# Patient Record
Sex: Male | Born: 1950
Health system: Southern US, Community
[De-identification: ages and names within clinical notes are randomized; demographics above are authoritative.]

## PROBLEM LIST (undated history)

## (undated) DIAGNOSIS — Z972 Presence of dental prosthetic device (complete) (partial): Secondary | ICD-10-CM

## (undated) DIAGNOSIS — R339 Retention of urine, unspecified: Secondary | ICD-10-CM

## (undated) DIAGNOSIS — K219 Gastro-esophageal reflux disease without esophagitis: Secondary | ICD-10-CM

## (undated) DIAGNOSIS — M51379 Other intervertebral disc degeneration, lumbosacral region without mention of lumbar back pain or lower extremity pain: Secondary | ICD-10-CM

## (undated) DIAGNOSIS — M5137 Other intervertebral disc degeneration, lumbosacral region: Secondary | ICD-10-CM

## (undated) DIAGNOSIS — F329 Major depressive disorder, single episode, unspecified: Secondary | ICD-10-CM

## (undated) DIAGNOSIS — F32A Depression, unspecified: Secondary | ICD-10-CM

## (undated) DIAGNOSIS — J9819 Other pulmonary collapse: Secondary | ICD-10-CM

## (undated) DIAGNOSIS — N400 Enlarged prostate without lower urinary tract symptoms: Secondary | ICD-10-CM

## (undated) DIAGNOSIS — M1990 Unspecified osteoarthritis, unspecified site: Secondary | ICD-10-CM

## (undated) DIAGNOSIS — M545 Low back pain, unspecified: Secondary | ICD-10-CM

## (undated) DIAGNOSIS — C8590 Non-Hodgkin lymphoma, unspecified, unspecified site: Secondary | ICD-10-CM

## (undated) DIAGNOSIS — Z978 Presence of other specified devices: Secondary | ICD-10-CM

## (undated) DIAGNOSIS — B182 Chronic viral hepatitis C: Secondary | ICD-10-CM

## (undated) DIAGNOSIS — G8929 Other chronic pain: Secondary | ICD-10-CM

## (undated) DIAGNOSIS — Z960 Presence of urogenital implants: Secondary | ICD-10-CM

## (undated) DIAGNOSIS — H9191 Unspecified hearing loss, right ear: Secondary | ICD-10-CM

## (undated) DIAGNOSIS — Z9889 Other specified postprocedural states: Secondary | ICD-10-CM

## (undated) DIAGNOSIS — F1010 Alcohol abuse, uncomplicated: Secondary | ICD-10-CM

## (undated) DIAGNOSIS — F1411 Cocaine abuse, in remission: Secondary | ICD-10-CM

## (undated) DIAGNOSIS — E119 Type 2 diabetes mellitus without complications: Secondary | ICD-10-CM

## (undated) DIAGNOSIS — K08109 Complete loss of teeth, unspecified cause, unspecified class: Secondary | ICD-10-CM

## (undated) DIAGNOSIS — Z973 Presence of spectacles and contact lenses: Secondary | ICD-10-CM

## (undated) DIAGNOSIS — Z923 Personal history of irradiation: Secondary | ICD-10-CM

## (undated) DIAGNOSIS — M4712 Other spondylosis with myelopathy, cervical region: Secondary | ICD-10-CM

## (undated) DIAGNOSIS — N529 Male erectile dysfunction, unspecified: Secondary | ICD-10-CM

## (undated) DIAGNOSIS — G510 Bell's palsy: Secondary | ICD-10-CM

## (undated) HISTORY — PX: JOINT REPLACEMENT: SHX530

## (undated) HISTORY — PX: MULTIPLE TOOTH EXTRACTIONS: SHX2053

## (undated) HISTORY — PX: OTHER SURGICAL HISTORY: SHX169

## (undated) HISTORY — PX: APPENDECTOMY: SHX54

## (undated) HISTORY — PX: BACK SURGERY: SHX140

## (undated) HISTORY — PX: TONSILLECTOMY: SUR1361

---

## 1997-08-02 HISTORY — PX: CERVICAL DISC SURGERY: SHX588

## 1998-04-03 ENCOUNTER — Encounter: Payer: Self-pay | Admitting: Emergency Medicine

## 1998-04-03 ENCOUNTER — Encounter: Payer: Self-pay | Admitting: General Surgery

## 1998-04-03 ENCOUNTER — Inpatient Hospital Stay (HOSPITAL_COMMUNITY): Admission: EM | Admit: 1998-04-03 | Discharge: 1998-04-11 | Payer: Self-pay | Admitting: Emergency Medicine

## 1998-04-04 ENCOUNTER — Encounter: Payer: Self-pay | Admitting: General Surgery

## 1998-04-05 ENCOUNTER — Encounter: Payer: Self-pay | Admitting: General Surgery

## 1998-04-09 ENCOUNTER — Encounter: Payer: Self-pay | Admitting: General Surgery

## 1998-04-30 ENCOUNTER — Inpatient Hospital Stay (HOSPITAL_COMMUNITY): Admission: RE | Admit: 1998-04-30 | Discharge: 1998-05-01 | Payer: Self-pay | Admitting: Neurosurgery

## 1998-08-21 ENCOUNTER — Emergency Department (HOSPITAL_COMMUNITY): Admission: EM | Admit: 1998-08-21 | Discharge: 1998-08-21 | Payer: Self-pay | Admitting: Emergency Medicine

## 2000-01-04 ENCOUNTER — Encounter: Payer: Self-pay | Admitting: Emergency Medicine

## 2000-01-04 ENCOUNTER — Inpatient Hospital Stay (HOSPITAL_COMMUNITY): Admission: EM | Admit: 2000-01-04 | Discharge: 2000-01-06 | Payer: Self-pay | Admitting: Emergency Medicine

## 2000-01-04 HISTORY — PX: OTHER SURGICAL HISTORY: SHX169

## 2000-01-05 ENCOUNTER — Encounter: Payer: Self-pay | Admitting: *Deleted

## 2000-08-26 ENCOUNTER — Emergency Department (HOSPITAL_COMMUNITY): Admission: EM | Admit: 2000-08-26 | Discharge: 2000-08-27 | Payer: Self-pay | Admitting: Internal Medicine

## 2000-11-15 ENCOUNTER — Encounter: Admission: RE | Admit: 2000-11-15 | Discharge: 2000-11-15 | Payer: Self-pay | Admitting: Family Medicine

## 2000-11-15 ENCOUNTER — Encounter: Payer: Self-pay | Admitting: Family Medicine

## 2001-04-03 ENCOUNTER — Emergency Department (HOSPITAL_COMMUNITY): Admission: EM | Admit: 2001-04-03 | Discharge: 2001-04-03 | Payer: Self-pay | Admitting: Emergency Medicine

## 2005-12-01 ENCOUNTER — Encounter: Payer: Self-pay | Admitting: General Surgery

## 2005-12-01 ENCOUNTER — Encounter: Payer: Self-pay | Admitting: Emergency Medicine

## 2006-01-10 ENCOUNTER — Emergency Department (HOSPITAL_COMMUNITY): Admission: EM | Admit: 2006-01-10 | Discharge: 2006-01-10 | Payer: Self-pay | Admitting: Emergency Medicine

## 2008-01-28 ENCOUNTER — Emergency Department (HOSPITAL_COMMUNITY): Admission: EM | Admit: 2008-01-28 | Discharge: 2008-01-29 | Payer: Self-pay | Admitting: Emergency Medicine

## 2008-10-21 ENCOUNTER — Encounter: Admission: RE | Admit: 2008-10-21 | Discharge: 2008-10-21 | Payer: Self-pay | Admitting: Internal Medicine

## 2010-03-26 ENCOUNTER — Emergency Department (HOSPITAL_COMMUNITY): Admission: EM | Admit: 2010-03-26 | Discharge: 2010-03-27 | Payer: Self-pay | Admitting: Emergency Medicine

## 2010-10-15 LAB — URINALYSIS, ROUTINE W REFLEX MICROSCOPIC
Bilirubin Urine: NEGATIVE
Glucose, UA: NEGATIVE mg/dL
Ketones, ur: NEGATIVE mg/dL
Nitrite: NEGATIVE
Specific Gravity, Urine: 1.018 (ref 1.005–1.030)
pH: 6 (ref 5.0–8.0)

## 2010-10-15 LAB — BASIC METABOLIC PANEL
BUN: 8 mg/dL (ref 6–23)
Chloride: 105 mEq/L (ref 96–112)
GFR calc non Af Amer: >60 mL/min (ref >60)
Potassium: 4.9 mEq/L (ref 3.5–5.1)
Sodium: 139 mEq/L (ref 135–145)

## 2010-10-15 LAB — CBC
HCT: 43.6 % (ref 39.0–52.0)
Hemoglobin: 15 g/dL (ref 13.0–17.0)
MCV: 90.6 fL (ref 78.0–100.0)
WBC: 7.2 10*3/uL (ref 4.0–10.5)

## 2010-10-15 LAB — DIFFERENTIAL
Eosinophils Relative: 4 % (ref 0–5)
Lymphocytes Relative: 45 % (ref 12–46)
Lymphs Abs: 3.2 10*3/uL (ref 0.7–4.0)
Monocytes Absolute: 0.6 10*3/uL (ref 0.1–1.0)
Monocytes Relative: 8 % (ref 3–12)
Neutro Abs: 3.1 10*3/uL (ref 1.7–7.7)

## 2010-12-18 NOTE — Op Note (Signed)
[place]. [facility]  Patient:    [name], [name]                          MRN: [id number] Proc. Date: 01/04/00 Adm. Date:  [id number] Disc. Date: [id number] Attending:  H[place]on Pier CC:         Scott R. Egbert Garibaldi, D.D.S.                           Operative Report  PREOPERATIVE DIAGNOSES:  Dental caries, teeth #7 and 9, left body and right parasymphysis fracture of the mandible.  POSTOPERATIVE DIAGNOSES:  Dental caries, teeth #7 and 9, left body and parasymphysis fracture of the mandible.  PROCEDURE:  Closed reduction and intramaxillary fixation, bilateral mental fractures; extraction of teeth #7 and 9; placement of maxillary stent; pyriform aperture wire placement as skeletal fixation.  ANESTHESIA:  Nasotracheal intubation.  SURGEON: Scott R. Egbert Garibaldi, D.D.S.  FLUID REPLACEMENT:  400 cc.  URINE OUTPUT:  To void.  ESTIMATED BLOOD LOSS:  20 cc.  OPERATIVE INDICATIONS:  Mr. Maher is a 60 year old black male status post an alleged fall while moving furniture on January 02, 2000.  He supposedly slipped and impacted his mandible on some masonry.  He stated he was negative for loss of consciousness, nausea, vomiting, or tinnitus.  He states his bite felt off and he had not eaten in two days.  He came to [facility] Saturday and supposedly left due to extensive waiting time in the emergency room.  The patient reports smoking cocaine, last reported time January 02, 2000.  He was wearing a partial plate at the time of the accident, which was lost.  PAST MEDICAL HISTORY:  Allergies:  No known drug allergies.  Hospitalizations: In 1999 for cervical spine fusion.  He also had an appendectomy and some testicular surgery in the past.  PHYSICAL EXAMINATION:  Clinical exam revealed tenderness to palpation of the left mandible and intraorally, obvious step defects between teeth #24 and 25 and 20 and 21.  Teeth #7 and 9 were carious.  A decision was made to bring Tir[place]  to the operating room for closed reduction and possible open reduction of his mental fractures.  In the emergency room, I had taken a maxillary impression to fabricate a stent.  DESCRIPTION OF PROCEDURE:  The patient was brought to the OR, placed in the supine position.  Appropriate monitoring lines were attached, and general anesthesia was induced via IV and inhalational techniques.  Nasotracheal intubation was done without complications.  The patient was prepped and draped in the standard fashion for intraoral and maxillofacial surgical procedure. Xylocaine 2%, 1:100,000 epinephrine, a total of 7 cc, was given bilaterally in the inferior alveolar, buccal, posterior superior alveolar, and incisor nerve blocks were given.  Bridle wires of the 24 gauge variety were placed around teeth #23 to 26, placing the mandible back into an acceptable occlusal position.  These were tightened.  Then a bridle wire was placed around teeth #20 and 21 and once again these were tightened down to the appropriate occlusion.  An Erich arch bar was fabricated from tooth #20 to 29, and this was ligated to the mandibular teeth via 24 gauge wire in a circumdental fashion.  These were cut and tied and rosetted.  At this point, the patient then rotated into an occlusion, but he was noted to have minimal occlusion on the patients right  side.  The predetermined stent was elected to be used.  At this point, teeth #7 and 9 were extracted using a periosteal elevator and an upper universal forceps.  Suture of 3-0 chromic gut x 2 was placed in this region.  The predetermined maxillary stent was then ligated to teeth #3, 5, 10 11, and 12.  These were done to the predrilled splint using a 24 gauge _____ wire.  These were tightened down appropriately.  At this point, it was felt it would be best to put a pyriform aperture wire in.  This was done with a small incision on the right maxillary vestibule down to the pyriform  aperture, at which point a tapered Fisher bur was then used to drill into the pyriform sinus area.  A 22 gauge _____ wire was fed in through this region and then exited intraorally and ligated to the maxillary stent.  This was tightened down and the stent was stable.   At this point, articulating paper was then used to articulate the stent to give an acceptable occlusion, and this was ground using a pear-shaped bur under copious normal saline irrigation.  Once the acceptable occlusion was found to be given, then it was elected to close the incision in the maxillary vestibule.  This was done with 3-0 chromic gut sutures x 4.  At this point, the oral cavity was then suctioned, the throat pack was removed, and box wires x 6 were placed in the mandible to the maxilla to secure a stable occlusion.  The patient was then extubated in the operating room and transferred to the recovery room with vital signs stable. DD:  01/04/00 TD:  01/07/00 Job: [id number]

## 2011-04-26 ENCOUNTER — Emergency Department (HOSPITAL_COMMUNITY): Payer: Self-pay

## 2011-04-26 ENCOUNTER — Emergency Department (HOSPITAL_COMMUNITY)
Admission: EM | Admit: 2011-04-26 | Discharge: 2011-04-26 | Disposition: A | Discharge status: Home / Self Care | Payer: Self-pay | Attending: Emergency Medicine | Admitting: Emergency Medicine

## 2011-04-26 DIAGNOSIS — X58XXXA Exposure to other specified factors, initial encounter: Secondary | ICD-10-CM | POA: Insufficient documentation

## 2011-04-26 DIAGNOSIS — M545 Low back pain, unspecified: Secondary | ICD-10-CM | POA: Insufficient documentation

## 2011-04-26 DIAGNOSIS — R29898 Other symptoms and signs involving the musculoskeletal system: Secondary | ICD-10-CM | POA: Insufficient documentation

## 2011-04-26 DIAGNOSIS — G8929 Other chronic pain: Secondary | ICD-10-CM | POA: Insufficient documentation

## 2011-04-26 DIAGNOSIS — R296 Repeated falls: Secondary | ICD-10-CM | POA: Insufficient documentation

## 2011-04-26 DIAGNOSIS — R079 Chest pain, unspecified: Secondary | ICD-10-CM | POA: Insufficient documentation

## 2011-04-26 DIAGNOSIS — R222 Localized swelling, mass and lump, trunk: Secondary | ICD-10-CM | POA: Insufficient documentation

## 2011-04-26 DIAGNOSIS — S32009A Unspecified fracture of unspecified lumbar vertebra, initial encounter for closed fracture: Secondary | ICD-10-CM | POA: Insufficient documentation

## 2011-05-11 ENCOUNTER — Other Ambulatory Visit (HOSPITAL_COMMUNITY): Payer: Self-pay | Admitting: Neurosurgery

## 2011-05-11 DIAGNOSIS — M545 Low back pain: Secondary | ICD-10-CM

## 2011-05-19 ENCOUNTER — Ambulatory Visit (HOSPITAL_COMMUNITY)
Admission: RE | Admit: 2011-05-19 | Discharge: 2011-05-19 | Disposition: A | Discharge status: Home / Self Care | Payer: Self-pay | Source: Ambulatory Visit | Attending: Neurosurgery | Admitting: Neurosurgery

## 2011-05-19 DIAGNOSIS — M5137 Other intervertebral disc degeneration, lumbosacral region: Secondary | ICD-10-CM | POA: Insufficient documentation

## 2011-05-19 DIAGNOSIS — M51379 Other intervertebral disc degeneration, lumbosacral region without mention of lumbar back pain or lower extremity pain: Secondary | ICD-10-CM | POA: Insufficient documentation

## 2011-05-19 DIAGNOSIS — M545 Low back pain: Secondary | ICD-10-CM

## 2011-07-22 ENCOUNTER — Ambulatory Visit: Payer: Self-pay | Admitting: Physical Therapy

## 2011-08-06 ENCOUNTER — Ambulatory Visit: Payer: Self-pay | Attending: Neurosurgery

## 2011-08-06 DIAGNOSIS — R293 Abnormal posture: Secondary | ICD-10-CM | POA: Insufficient documentation

## 2011-08-06 DIAGNOSIS — M2550 Pain in unspecified joint: Secondary | ICD-10-CM | POA: Insufficient documentation

## 2011-08-06 DIAGNOSIS — IMO0001 Reserved for inherently not codable concepts without codable children: Secondary | ICD-10-CM | POA: Insufficient documentation

## 2011-08-06 DIAGNOSIS — R262 Difficulty in walking, not elsewhere classified: Secondary | ICD-10-CM | POA: Insufficient documentation

## 2011-08-11 ENCOUNTER — Encounter: Payer: Self-pay | Admitting: Rehabilitation

## 2011-08-13 ENCOUNTER — Ambulatory Visit: Payer: Self-pay

## 2011-08-25 ENCOUNTER — Ambulatory Visit: Payer: Self-pay

## 2011-09-07 ENCOUNTER — Ambulatory Visit: Payer: Self-pay | Attending: Neurosurgery

## 2011-09-07 DIAGNOSIS — R262 Difficulty in walking, not elsewhere classified: Secondary | ICD-10-CM | POA: Insufficient documentation

## 2011-09-07 DIAGNOSIS — IMO0001 Reserved for inherently not codable concepts without codable children: Secondary | ICD-10-CM | POA: Insufficient documentation

## 2011-09-07 DIAGNOSIS — R293 Abnormal posture: Secondary | ICD-10-CM | POA: Insufficient documentation

## 2011-09-07 DIAGNOSIS — M2550 Pain in unspecified joint: Secondary | ICD-10-CM | POA: Insufficient documentation

## 2011-09-09 ENCOUNTER — Ambulatory Visit: Payer: Self-pay

## 2011-10-29 ENCOUNTER — Emergency Department (HOSPITAL_COMMUNITY)
Admission: EM | Admit: 2011-10-29 | Discharge: 2011-10-29 | Disposition: A | Discharge status: Home / Self Care | Payer: Medicaid Other | Attending: Emergency Medicine | Admitting: Emergency Medicine

## 2011-10-29 ENCOUNTER — Emergency Department (HOSPITAL_COMMUNITY): Payer: Medicaid Other

## 2011-10-29 ENCOUNTER — Encounter (HOSPITAL_COMMUNITY): Payer: Self-pay | Admitting: *Deleted

## 2011-10-29 DIAGNOSIS — M545 Low back pain, unspecified: Secondary | ICD-10-CM | POA: Insufficient documentation

## 2011-10-29 DIAGNOSIS — K644 Residual hemorrhoidal skin tags: Secondary | ICD-10-CM | POA: Insufficient documentation

## 2011-10-29 DIAGNOSIS — M549 Dorsalgia, unspecified: Secondary | ICD-10-CM | POA: Insufficient documentation

## 2011-10-29 DIAGNOSIS — G8929 Other chronic pain: Secondary | ICD-10-CM | POA: Insufficient documentation

## 2011-10-29 LAB — DIFFERENTIAL
Basophils Relative: 0 % (ref 0–1)
Eosinophils Absolute: 0.2 10*3/uL (ref 0.0–0.7)
Eosinophils Relative: 3 % (ref 0–5)
Monocytes Absolute: 0.4 10*3/uL (ref 0.1–1.0)
Monocytes Relative: 7 % (ref 3–12)
Neutro Abs: 3.2 10*3/uL (ref 1.7–7.7)

## 2011-10-29 LAB — CBC
HCT: 48.3 % (ref 39.0–52.0)
Hemoglobin: 16.3 g/dL (ref 13.0–17.0)
MCH: 31.3 pg (ref 26.0–34.0)
MCHC: 33.7 g/dL (ref 30.0–36.0)
MCV: 92.7 fL (ref 78.0–100.0)
RDW: 12.4 % (ref 11.5–15.5)

## 2011-10-29 LAB — POCT I-STAT, CHEM 8
Calcium, Ion: 1.26 mmol/L (ref 1.12–1.32)
Creatinine, Ser: 1 mg/dL (ref 0.50–1.35)
Glucose, Bld: 98 mg/dL (ref 70–99)
Hemoglobin: 17.7 g/dL — ABNORMAL HIGH (ref 13.0–17.0)
Potassium: 4.4 mEq/L (ref 3.5–5.1)

## 2011-10-29 MED ORDER — HYDROCODONE-ACETAMINOPHEN 10-500 MG PO TABS: 1.0000 | ORAL_TABLET | Freq: Four times a day (QID) | ORAL | Status: AC | PRN

## 2011-10-29 MED ORDER — OXYCODONE-ACETAMINOPHEN 5-325 MG PO TABS: 1.0000 | ORAL_TABLET | Freq: Four times a day (QID) | ORAL | Status: DC | PRN

## 2011-10-29 MED ORDER — PRAMOXINE-ZINC OXIDE IN MO 1-12.5 % RE OINT: TOPICAL_OINTMENT | RECTAL | Status: DC

## 2011-10-29 MED ORDER — OXYCODONE-ACETAMINOPHEN 5-325 MG PO TABS
2.0000 | ORAL_TABLET | Freq: Once | ORAL | Status: AC
  Administered 2011-10-29: 2 via ORAL
  Filled 2011-10-29: qty 2

## 2011-10-29 NOTE — ED Notes (Signed)
Pt returned from x-ray. States 10/10 lower back pain. Lab at the bedside to draw. No signs of distress noted at the time.

## 2011-10-29 NOTE — ED Notes (Signed)
Pt discharged home. Had no further questions. 

## 2011-10-29 NOTE — Discharge Instructions (Signed)
Please follow up with GI specialist if you continue to have rectal pain and discharge not relieved with medication.  Return to ER if you have any other concerns.    Back Exercises Back exercises help treat and prevent back injuries. The goal of back exercises is to increase the strength of your abdominal and back muscles and the flexibility of your back. These exercises should be started when you no longer have back pain. Back exercises include:  Pelvic Tilt. Lie on your back with your knees bent. Tilt your pelvis until the lower part of your back is against the floor. Hold this position 5 to 10 sec and repeat 5 to 10 times.   Knee to Chest. Pull first 1 knee up against your chest and hold for 20 to 30 seconds, repeat this with the other knee, and then both knees. This may be done with the other leg straight or bent, whichever feels better.   Sit-Ups or Curl-Ups. Bend your knees 90 degrees. Start with tilting your pelvis, and do a partial, slow sit-up, lifting your trunk only 30 to 45 degrees off the floor. Take at least 2 to 3 seconds for each sit-up. Do not do sit-ups with your knees out straight. If partial sit-ups are difficult, simply do the above but with only tightening your abdominal muscles and holding it as directed.   Hip-Lift. Lie on your back with your knees flexed 90 degrees. Push down with your feet and shoulders as you raise your hips a couple inches off the floor; hold for 10 seconds, repeat 5 to 10 times.   Back arches. Lie on your stomach, propping yourself up on bent elbows. Slowly press on your hands, causing an arch in your low back. Repeat 3 to 5 times. Any initial stiffness and discomfort should lessen with repetition over time.   Shoulder-Lifts. Lie face down with arms beside your body. Keep hips and torso pressed to floor as you slowly lift your head and shoulders off the floor.  Do not overdo your exercises, especially in the beginning. Exercises may cause you some mild  back discomfort which lasts for a few minutes; however, if the pain is more severe, or lasts for more than 15 minutes, do not continue exercises until you see your caregiver. Improvement with exercise therapy for back problems is slow.  See your caregivers for assistance with developing a proper back exercise program. Document Released: 08/26/2004 Document Revised: 07/08/2011 Document Reviewed: 07/19/2005 [facility] Patient Information 2012 [place], [place].Hemorrhoids Hemorrhoids are enlarged (dilated) veins around the rectum. There are 2 types of hemorrhoids, and the type of hemorrhoid is determined by its location. Internal hemorrhoids occur in the veins just inside the rectum.They are usually not painful, but they may bleed.However, they may poke through to the outside and become irritated and painful. External hemorrhoids involve the veins outside the anus and can be felt as a painful swelling or hard lump near the anus.They are often itchy and may crack and bleed. Sometimes clots will form in the veins. This makes them swollen and painful. These are called thrombosed hemorrhoids. CAUSES Causes of hemorrhoids include:  Pregnancy. This increases the pressure in the hemorrhoidal veins.   Constipation.   Straining to have a bowel movement.   Obesity.   Heavy lifting or other activity that caused you to strain.  TREATMENT Most of the time hemorrhoids improve in 1 to 2 weeks. However, if symptoms do not seem to be getting better or if you have a  lot of rectal bleeding, your caregiver may perform a procedure to help make the hemorrhoids get smaller or remove them completely.Possible treatments include:  Rubber band ligation. A rubber band is placed at the base of the hemorrhoid to cut off the circulation.   Sclerotherapy. A chemical is injected to shrink the hemorrhoid.   Infrared light therapy. Tools are used to burn the hemorrhoid.   Hemorrhoidectomy. This is surgical removal of the  hemorrhoid.  HOME CARE INSTRUCTIONS   Increase fiber in your diet. Ask your caregiver about using fiber supplements.   Drink enough water and fluids to keep your urine clear or pale yellow.   Exercise regularly.   Go to the bathroom when you have the urge to have a bowel movement. Do not wait.   Avoid straining to have bowel movements.   Keep the anal area dry and clean.   Only take over-the-counter or prescription medicines for pain, discomfort, or fever as directed by your caregiver.  If your hemorrhoids are thrombosed:  Take warm sitz baths for 20 to 30 minutes, 3 to 4 times per day.   If the hemorrhoids are very tender and swollen, place ice packs on the area as tolerated. Using ice packs between sitz baths may be helpful. Fill a plastic bag with ice. Place a towel between the bag of ice and your skin.   Medicated creams and suppositories may be used or applied as directed.   Do not use a donut-shaped pillow or sit on the toilet for long periods. This increases blood pooling and pain.  SEEK MEDICAL CARE IF:   You have increasing pain and swelling that is not controlled with your medicine.   You have uncontrolled bleeding.   You have difficulty or you are unable to have a bowel movement.   You have pain or inflammation outside the area of the hemorrhoids.   You have chills or an oral temperature above 102 F (38.9 C).  MAKE SURE YOU:   Understand these instructions.   Will watch your condition.   Will get help right away if you are not doing well or get worse.  Document Released: 07/16/2000 Document Revised: 07/08/2011 Document Reviewed: 11/21/2007 [facility] Patient Information 2012 [place], [place].

## 2011-10-29 NOTE — ED Provider Notes (Signed)
History     CSN: [id number]  Arrival date & time 10/29/11  1315   First MD Initiated Contact with Patient 10/29/11 1432      Chief Complaint  Patient presents with  . Back Pain    (Consider location/radiation/quality/duration/timing/severity/associated sxs/prior treatment) HPI  61 year old male with a history of chronic back pain is presents with a chief complaints of low back pain and rectal pain. Patient states he developed low back pain after falling 40 feet and landed on his back in 2000.  Sts he has chronic pain and usually takes vicodin for it however pain is getting progressively worse in the past month.  Sts pain usually treated better with percocet but since he's taking vicodin it hasn't really control his pain.  Pt also sts he has hx of external hemorrhoids and has been having increasing rectal pain and related to his hemorrhoids.  Sts pain worsen with defecations or with straining.  Has notice occasional bright red blood per rectum, last seen 2 weeks ago.  For the past 2 weeks he has notices rectal discharge on underwear with strong odor.  Think he has infected hemorrhoids.  Denies fever, n/v, abd pain.  Denies cp, sob.  Sts his back pain felt similar to chronic pain.  Denies numbness or weakness or red flag complaints.  Denies rash or recent trauma.  Past Medical History  Diagnosis Date  . Back pain     Past Surgical History  Procedure Date  . Appendectomy   . Lung surgery   . Neck surgery     No family history on file.  History  Substance Use Topics  . Smoking status: Never Smoker   . Smokeless tobacco: Not on file  . Alcohol Use: No      Review of Systems  All other systems reviewed and are negative.    Allergies  Review of patient's allergies indicates no known allergies.  Home Medications   Current Outpatient Rx  Name Route Sig Dispense Refill  . HYDROCODONE-ACETAMINOPHEN 10-325 MG PO TABS Oral Take 1 tablet by mouth every 6 (six) hours as needed.     . OXYCODONE-ACETAMINOPHEN 10-325 MG PO TABS Oral Take 1 tablet by mouth every 6 (six) hours as needed.      BP 116/85  Pulse 88  Temp(Src) 98.2 F (36.8 C) (Oral)  Resp 20  SpO2 98%  Physical Exam  Nursing note and vitals reviewed. Constitutional: He appears well-developed and well-nourished. No distress.       Awake, alert, nontoxic appearance  HENT:  Head: Atraumatic.  Eyes: Conjunctivae are normal. Right eye exhibits no discharge. Left eye exhibits no discharge.  Neck: Normal range of motion. Neck supple.  Cardiovascular: Normal rate and regular rhythm.   Pulmonary/Chest: Effort normal. No respiratory distress. He exhibits no tenderness.  Abdominal: Soft. There is no tenderness. There is no rebound.  Genitourinary:       External hemorrhoid without evidence of thrombosis.  Rectal pain on digital exam, but no evidence of bleeding, or abscess noted.  No mass noted.    Musculoskeletal: He exhibits no tenderness.       ROM appears intact, no obvious focal weakness.  Tenderness to midline of the lower lumbar region without overlying skin changes.  Neurological: He is alert.  Skin: Skin is warm and dry. No rash noted.  Psychiatric: He has a normal mood and affect.    ED Course  Procedures (including critical care time)  Labs Reviewed - No data to  display No results found.   No diagnosis found.  Results for orders placed during the hospital encounter of 10/29/11  CBC      Component Value Range   WBC 6.0  4.0 - 10.5 (K/uL)   RBC 5.21  4.22 - 5.81 (MIL/uL)   Hemoglobin 16.3  13.0 - 17.0 (g/dL)   HCT 40.9  81.1 - 91.4 (%)   MCV 92.7  78.0 - 100.0 (fL)   MCH 31.3  26.0 - 34.0 (pg)   MCHC 33.7  30.0 - 36.0 (g/dL)   RDW 78.2  95.6 - 21.3 (%)   Platelets 225  150 - 400 (K/uL)  DIFFERENTIAL      Component Value Range   Neutrophils Relative 54  43 - 77 (%)   Neutro Abs 3.2  1.7 - 7.7 (K/uL)   Lymphocytes Relative 36  12 - 46 (%)   Lymphs Abs 2.1  0.7 - 4.0 (K/uL)    Monocytes Relative 7  3 - 12 (%)   Monocytes Absolute 0.4  0.1 - 1.0 (K/uL)   Eosinophils Relative 3  0 - 5 (%)   Eosinophils Absolute 0.2  0.0 - 0.7 (K/uL)   Basophils Relative 0  0 - 1 (%)   Basophils Absolute 0.0  0.0 - 0.1 (K/uL)  OCCULT BLOOD, POC DEVICE      Component Value Range   Fecal Occult Bld NEGATIVE     Dg Lumbar Spine Complete  10/29/2011  *RADIOLOGY REPORT*  Clinical Data: Low back pain for 8 months.  LUMBAR SPINE - COMPLETE 4+ VIEW  Comparison: 05/19/2011.  Findings: Mild-moderate multilevel lumbar spondylosis.  Chronic L3 superior endplate compression fracture is unchanged. Per CMS PQRS reporting requirements (PQRS Measure 24): Given the patient's age of greater than 50 and the fracture site (hip, distal radius, or spine), the patient should be tested for osteoporosis using DXA, and the appropriate treatment considered based on the DXA results. L5-S1 predominant spondylosis is present with disc space narrowing and endplate osteophytes.  There are no pars defects identified. L5-S1 facet arthrosis is present.  IMPRESSION: No significant interval change compared to prior MRI 05/19/2011. Lumbar spondylosis and chronic L3 compression fracture.  Original Report Authenticated By: Andreas Newport, M.D.      MDM  Acute on chronic lower back pain and rectal pain w/ hx of external hemorrhoids.  Is afebrile with stable VS.  No obvious abscess or rash noted on exam.  WIll obtain CBC, Istats, and Lspine xray.  Pain medication given.    5:18 PM Stable normal vital signs, and is afebrile. Normal white count. Hemoccult is negative. X-ray of lumbar spine shows no acute finding. Evidence of external hemorrhoid on exam but no evidence of abscess noted. Patient will be getting a referral to GI. I will also give him pain medication and Anusol for symptoms control.  Strict f/u instruction given.  Discussed with my attending.        Fayrene Helper, PA-C 10/29/11 1719

## 2011-10-29 NOTE — ED Notes (Signed)
Patient has complaints of chronic back pain post fall in 2000.  He states he cannot get into see an md due to no insurance.  Patient states he thinks he may also have infected hemorrhoids.  He states he has noticed drainage from his rectum and the area is very swollen

## 2011-10-29 NOTE — ED Provider Notes (Signed)
Medical screening examination/treatment/procedure(s) were performed by non-physician practitioner and as supervising physician I was immediately available for consultation/collaboration.  Chryl Holten R. Mylie Mccurley, MD 10/29/11 2329 

## 2012-05-31 ENCOUNTER — Other Ambulatory Visit (HOSPITAL_COMMUNITY): Payer: Self-pay | Admitting: Neurosurgery

## 2012-05-31 DIAGNOSIS — M5410 Radiculopathy, site unspecified: Secondary | ICD-10-CM

## 2012-05-31 DIAGNOSIS — M549 Dorsalgia, unspecified: Secondary | ICD-10-CM

## 2012-06-05 ENCOUNTER — Ambulatory Visit (HOSPITAL_COMMUNITY): Admission: RE | Admit: 2012-06-05 | Payer: Medicaid Other | Source: Ambulatory Visit

## 2012-06-06 ENCOUNTER — Ambulatory Visit (HOSPITAL_COMMUNITY): Payer: Medicaid Other | Attending: Neurosurgery

## 2012-06-14 ENCOUNTER — Ambulatory Visit (HOSPITAL_COMMUNITY)
Admission: RE | Admit: 2012-06-14 | Discharge: 2012-06-14 | Disposition: A | Discharge status: Home / Self Care | Payer: Medicaid Other | Source: Ambulatory Visit | Attending: Neurosurgery | Admitting: Neurosurgery

## 2012-06-14 DIAGNOSIS — M47817 Spondylosis without myelopathy or radiculopathy, lumbosacral region: Secondary | ICD-10-CM | POA: Insufficient documentation

## 2012-06-14 DIAGNOSIS — M549 Dorsalgia, unspecified: Secondary | ICD-10-CM | POA: Insufficient documentation

## 2012-06-14 DIAGNOSIS — M5410 Radiculopathy, site unspecified: Secondary | ICD-10-CM

## 2012-06-14 DIAGNOSIS — IMO0002 Reserved for concepts with insufficient information to code with codable children: Secondary | ICD-10-CM | POA: Insufficient documentation

## 2012-06-27 ENCOUNTER — Other Ambulatory Visit (HOSPITAL_COMMUNITY): Payer: Self-pay | Admitting: Neurosurgery

## 2012-08-15 ENCOUNTER — Other Ambulatory Visit (HOSPITAL_COMMUNITY): Payer: Self-pay | Admitting: Neurosurgery

## 2012-09-08 ENCOUNTER — Other Ambulatory Visit (HOSPITAL_COMMUNITY): Payer: Self-pay | Admitting: Neurosurgery

## 2013-04-13 ENCOUNTER — Emergency Department (HOSPITAL_COMMUNITY): Payer: Medicaid Other

## 2013-04-13 ENCOUNTER — Encounter (HOSPITAL_COMMUNITY): Payer: Self-pay | Admitting: Adult Health

## 2013-04-13 ENCOUNTER — Emergency Department (HOSPITAL_COMMUNITY)
Admission: EM | Admit: 2013-04-13 | Discharge: 2013-04-13 | Disposition: A | Discharge status: Home / Self Care | Payer: Medicaid Other | Attending: Emergency Medicine | Admitting: Emergency Medicine

## 2013-04-13 ENCOUNTER — Other Ambulatory Visit: Payer: Self-pay

## 2013-04-13 DIAGNOSIS — R109 Unspecified abdominal pain: Secondary | ICD-10-CM | POA: Insufficient documentation

## 2013-04-13 DIAGNOSIS — D1809 Hemangioma of other sites: Secondary | ICD-10-CM | POA: Insufficient documentation

## 2013-04-13 DIAGNOSIS — D1803 Hemangioma of intra-abdominal structures: Secondary | ICD-10-CM

## 2013-04-13 DIAGNOSIS — M549 Dorsalgia, unspecified: Secondary | ICD-10-CM | POA: Insufficient documentation

## 2013-04-13 DIAGNOSIS — R079 Chest pain, unspecified: Secondary | ICD-10-CM | POA: Insufficient documentation

## 2013-04-13 DIAGNOSIS — Z9089 Acquired absence of other organs: Secondary | ICD-10-CM | POA: Insufficient documentation

## 2013-04-13 DIAGNOSIS — R945 Abnormal results of liver function studies: Secondary | ICD-10-CM | POA: Insufficient documentation

## 2013-04-13 DIAGNOSIS — G8929 Other chronic pain: Secondary | ICD-10-CM | POA: Insufficient documentation

## 2013-04-13 LAB — CBC
Platelets: 169 10*3/uL (ref 150–400)
RBC: 4.49 MIL/uL (ref 4.22–5.81)
WBC: 7.6 10*3/uL (ref 4.0–10.5)

## 2013-04-13 LAB — PRO B NATRIURETIC PEPTIDE: Pro B Natriuretic peptide (BNP): 131.4 pg/mL — ABNORMAL HIGH (ref 0–125)

## 2013-04-13 LAB — HEPATIC FUNCTION PANEL
ALT: 74 U/L — ABNORMAL HIGH (ref 0–53)
AST: 69 U/L — ABNORMAL HIGH (ref 0–37)
Albumin: 4.2 g/dL (ref 3.5–5.2)
Total Protein: 8.4 g/dL — ABNORMAL HIGH (ref 6.0–8.3)

## 2013-04-13 LAB — BASIC METABOLIC PANEL
CO2: 24 mEq/L (ref 19–32)
Chloride: 97 mEq/L (ref 96–112)
Sodium: 134 mEq/L — ABNORMAL LOW (ref 135–145)

## 2013-04-13 LAB — POCT I-STAT TROPONIN I

## 2013-04-13 MED ORDER — OXYCODONE HCL 5 MG PO TABS: 5.0000 mg | ORAL_TABLET | Freq: Four times a day (QID) | ORAL | Status: DC | PRN

## 2013-04-13 MED ORDER — OXYCODONE HCL 5 MG PO TABS
5.0000 mg | ORAL_TABLET | Freq: Once | ORAL | Status: AC
  Administered 2013-04-13: 5 mg via ORAL
  Filled 2013-04-13: qty 1

## 2013-04-13 NOTE — ED Provider Notes (Signed)
CSN: [id number]     Arrival date & time 04/13/13  0040 History   First MD Initiated Contact with Patient 04/13/13 0250     Chief Complaint  Patient presents with  . Chest Pain  . Back Pain   (Consider location/radiation/quality/duration/timing/severity/associated sxs/prior Treatment) HPI 62 year old male presents to emergency room with multiple complaints.  He reports he's been having left lower chest tingling type pain ongoing for the last 3 months, diffuse upper abdominal pain, radiating into his ribs.  He occasionally gets shortness of breath with exertion or deep inspiration.  He denies any fevers or chills, no weight loss, no cough.  He is nonsmoker.  Patient has history of chronic back pain after an accident, but no other medical problems.  He reports he saw a chest x-ray done after his last in jail, and was concerned about a mass that he noted in his left lower lung.  He reports he was never told what was wrong with this mass, and it has been weighing on his mind.  Patient is also here for back pain.  He reports prior cervical and lumbar fractures.  He has pain at his left SI joint that radiates down his leg.  He is wanting further workup and treatment of this ongoing chronic pain. Past Medical History  Diagnosis Date  . Back pain    Past Surgical History  Procedure Laterality Date  . Appendectomy    . Lung surgery    . Neck surgery     History reviewed. No pertinent family history. History  Substance Use Topics  . Smoking status: Never Smoker   . Smokeless tobacco: Not on file  . Alcohol Use: No    Review of Systems  All other systems reviewed and are negative.   other than listed in history of present illness  Allergies  Review of patient's allergies indicates no known allergies.  Home Medications  No current outpatient prescriptions on file. BP 111/81  Pulse 105  Temp(Src) 98.7 F (37.1 C) (Oral)  Resp 16  Wt 182 lb (82.555 kg)  SpO2 99% Physical Exam   Nursing note and vitals reviewed. Constitutional: He is oriented to person, place, and time. He appears well-developed and well-nourished. He appears distressed (, anxious appearing).  HENT:  Head: Normocephalic and atraumatic.  Nose: Nose normal.  Mouth/Throat: Oropharynx is clear and moist.  Eyes: Conjunctivae and EOM are normal. Pupils are equal, round, and reactive to light.  Neck: Normal range of motion. Neck supple. No JVD present. No tracheal deviation present. No thyromegaly present.  Cardiovascular: Normal rate, regular rhythm, normal heart sounds and intact distal pulses.  Exam reveals no gallop and no friction rub.   No murmur heard. Pulmonary/Chest: Effort normal and breath sounds normal. No stridor. No respiratory distress. He has no wheezes. He has no rales. He exhibits no tenderness.  Abdominal: Soft. Bowel sounds are normal. He exhibits no distension and no mass. There is tenderness (diffuse tenderness across upper abdomen, worse in right upper quadrant). There is no rebound and no guarding.  Musculoskeletal: Normal range of motion. He exhibits no edema and no tenderness.  Lymphadenopathy:    He has no cervical adenopathy.  Neurological: He is alert and oriented to person, place, and time. He exhibits normal muscle tone. Coordination normal.  Skin: Skin is warm and dry. No rash noted. No erythema. No pallor.  Psychiatric: He has a normal mood and affect. His behavior is normal. Judgment and thought content normal.  ED Course  Procedures (including critical care time) Labs Review Labs Reviewed  BASIC METABOLIC PANEL - Abnormal; Notable for the following:    Sodium 134 (*)    Glucose, Bld 108 (*)    GFR calc non Af Amer 88 (*)    All other components within normal limits  PRO B NATRIURETIC PEPTIDE - Abnormal; Notable for the following:    Pro B Natriuretic peptide (BNP) 131.4 (*)    All other components within normal limits  HEPATIC FUNCTION PANEL - Abnormal; Notable  for the following:    Total Protein 8.4 (*)    AST 69 (*)    ALT 74 (*)    All other components within normal limits  CBC  TROPONIN I   Imaging Review Dg Chest 2 View  04/13/2013   CLINICAL DATA:  Chest pain on the left with left arm numbness. Shortness of Breath. Low back pain. Symptoms for 2 or 3 months.  EXAM: CHEST  2 VIEW  COMPARISON:  04/26/2011  FINDINGS: The heart size and mediastinal contours are within normal limits. Both lungs are clear. The visualized skeletal structures are unremarkable. Postoperative changes in the cervical spine. Degenerative changes in the thoracic spine. No significant change since previous study.  IMPRESSION: No active cardiopulmonary disease.   Electronically Signed   By: Burman Nieves   On: 04/13/2013 01:22   US Abdomen Complete  04/13/2013   CLINICAL DATA:  Left upper quadrant pain.  EXAM: ABDOMEN ULTRASOUND  COMPARISON:  CT abdomen and pelvis 03/27/2010  FINDINGS: Gallbladder  No gallstones or wall thickening. Negative sonographic Murphy's sign.  Common bile duct  Diameter: Normal caliber.  Diameter measures 3.5 mm  Liver  Focal hyperechoic lesion in the inferior right hepatic lobe measures 1.3 x 1.1 x 1.4 cm. The lesion is homogeneous hyperechoic with increased through transmission. The appearance is consistent with hemangioma. No other focal liver lesions identified. Otherwise normal parenchymal echotexture.  IVC  No abnormality visualized.  Pancreas  Not visualized due to overlying bowel gas.  Spleen  Size and appearance within normal limits.  Right Kidney  Length: 10.5 Echogenicity within normal limits. No mass or hydronephrosis visualized.  Left Kidney  Length: 11.2 Echogenicity within normal limits. No mass or hydronephrosis visualized.  Abdominal aorta  No aneurysm visualized.  IMPRESSION: Hyperechoic liver lesion consistent with hemangioma. Examination is otherwise unremarkable.   Electronically Signed   By: Burman Nieves   On: 04/13/2013 05:42     Date: 04/13/2013  Rate: 107  Rhythm: sinus tachycardia  QRS Axis: normal  Intervals: normal  ST/T Wave abnormalities: normal  Conduction Disutrbances:none  Narrative Interpretation:   Old EKG Reviewed: none available   MDM   1. Abdominal pain   2. Hemangioma of liver   3. Elevated LFTs   4. Chronic back pain    62 year old male with 3 months of upper abdominal pain, and some chest tingling.  I showed him today.  His x-ray, and he indicates that the left heart border is what he saw on the chest x-ray that concerning for mass.  He should is tenderness.  Right upper quadrant.  Today.  We'll get ultrasound and LFTs for possible hepatitis or cholecystitis.  EKG shows mild sinus tachycardia, chest x-ray is normal.  Do not feel that symptoms are cardiac related.  Do not feel patient has PE.  If ultrasound and remaining lab work is negative, will help.  Patient find a primary care doctor and refer him to orthopedics  for his ongoing back pain.   Olivia Mackie, MD 04/13/13 [id number]

## 2013-04-13 NOTE — ED Notes (Signed)
Presents with 3 weeks of intermittent "tingling" chest pain worse with deep inspiration, exertion, painis left sided does not radiate. Associated with SOB, dizziness. Denies nausea and vomiting. Nothing makes pain better. Pt also c/o chronic lower back pain from a injury many years ago.

## 2013-04-18 ENCOUNTER — Ambulatory Visit (HOSPITAL_COMMUNITY)
Admission: RE | Admit: 2013-04-18 | Discharge: 2013-04-18 | Disposition: A | Discharge status: Home / Self Care | Payer: Medicaid Other | Source: Ambulatory Visit | Attending: Nurse Practitioner | Admitting: Nurse Practitioner

## 2013-04-18 ENCOUNTER — Other Ambulatory Visit (HOSPITAL_COMMUNITY): Payer: Self-pay | Admitting: Nurse Practitioner

## 2013-04-18 DIAGNOSIS — R52 Pain, unspecified: Secondary | ICD-10-CM

## 2013-04-18 DIAGNOSIS — M1710 Unilateral primary osteoarthritis, unspecified knee: Secondary | ICD-10-CM | POA: Insufficient documentation

## 2013-04-30 ENCOUNTER — Emergency Department (HOSPITAL_COMMUNITY)
Admission: EM | Admit: 2013-04-30 | Discharge: 2013-04-30 | Disposition: A | Discharge status: Home / Self Care | Payer: Medicaid Other | Attending: Emergency Medicine | Admitting: Emergency Medicine

## 2013-04-30 ENCOUNTER — Encounter (HOSPITAL_COMMUNITY): Payer: Self-pay | Admitting: *Deleted

## 2013-04-30 DIAGNOSIS — M25462 Effusion, left knee: Secondary | ICD-10-CM

## 2013-04-30 DIAGNOSIS — M25569 Pain in unspecified knee: Secondary | ICD-10-CM | POA: Insufficient documentation

## 2013-04-30 DIAGNOSIS — M25469 Effusion, unspecified knee: Secondary | ICD-10-CM | POA: Insufficient documentation

## 2013-04-30 DIAGNOSIS — Z8781 Personal history of (healed) traumatic fracture: Secondary | ICD-10-CM | POA: Insufficient documentation

## 2013-04-30 DIAGNOSIS — M25562 Pain in left knee: Secondary | ICD-10-CM

## 2013-04-30 DIAGNOSIS — Z791 Long term (current) use of non-steroidal anti-inflammatories (NSAID): Secondary | ICD-10-CM | POA: Insufficient documentation

## 2013-04-30 MED ORDER — OXYCODONE-ACETAMINOPHEN 5-325 MG PO TABS: ORAL_TABLET | ORAL | Status: DC

## 2013-04-30 MED ORDER — OXYCODONE-ACETAMINOPHEN 5-325 MG PO TABS
2.0000 | ORAL_TABLET | Freq: Once | ORAL | Status: AC
  Administered 2013-04-30: 2 via ORAL
  Filled 2013-04-30: qty 2

## 2013-04-30 NOTE — Progress Notes (Signed)
Orthopedic Tech Progress Note Patient Details:  [name] 1951-04-14 [id number]  Ortho Devices Type of Ortho Device: Crutches;Knee Sleeve Ortho Device/Splint Location: LLE Ortho Device/Splint Interventions: Ordered;Application   Jennye Moccasin 04/30/2013, 10:47 PM

## 2013-04-30 NOTE — ED Notes (Signed)
Crutches given by [employer]

## 2013-04-30 NOTE — ED Notes (Signed)
Pt states that he has left knee pain from an known injury that he cannot see an orthopedic until next week. Pt states he is out of his pain medication (10mg  percocets) pt states he cannot deal with the pain until next month.

## 2013-04-30 NOTE — ED Provider Notes (Signed)
CSN: [id number]     Arrival date & time 04/30/13  2042 History  This chart was scribed for non-physician practitioner Junius Finner, PA-C working with Junius Argyle, MD by Leone Payor, ED Scribe. This patient was seen in room TR11C/TR11C and the patient's care was started at 2042.    Chief Complaint  Patient presents with  . Knee Pain    The history is provided by the patient. No language interpreter was used.    HPI Comments: [name] is a 62 y.o. male who presents to the Emergency Department complaining of ongoing, constant, gradually worsening left knee pain and swelling that began 3 weeks ago. He states having a known prior injury when he fractured his patella. He describes the pain as aching and throbbing and rates is as 10/10. He states he cannot be seen by Dr. Lovell Sheehan at a pain clinic until 05/23/13. He denies fever, rash, calf pain, numbness or weakness in the BLE. He denies history of gout.   Pt denies smoking and alcohol use.   Past Medical History  Diagnosis Date  . Back pain    Past Surgical History  Procedure Laterality Date  . Appendectomy    . Lung surgery    . Neck surgery     History reviewed. No pertinent family history. History  Substance Use Topics  . Smoking status: Never Smoker   . Smokeless tobacco: Not on file  . Alcohol Use: No    Review of Systems  Musculoskeletal: Positive for arthralgias (left knee pain).  Neurological: Negative for weakness and numbness.  All other systems reviewed and are negative.    Allergies  Review of patient's allergies indicates no known allergies.  Home Medications   Current Outpatient Rx  Name  Route  Sig  Dispense  Refill  . naproxen sodium (ANAPROX) 220 MG tablet   Oral   Take 440 mg by mouth 2 (two) times daily with a meal.         . oxyCODONE (OXY IR/ROXICODONE) 5 MG immediate release tablet   Oral   Take 1 tablet (5 mg total) by mouth every 6 (six) hours as needed for pain.   20 tablet   0    . oxyCODONE-acetaminophen (PERCOCET/ROXICET) 5-325 MG per tablet      Take 1-2 pills every 4-6 hours as needed for pain.   15 tablet   0    BP 129/86  Pulse 98  Temp(Src) 97.8 F (36.6 C) (Oral)  Resp 16  SpO2 98% Physical Exam  Nursing note and vitals reviewed. Constitutional: He is oriented to person, place, and time. He appears well-developed and well-nourished. No distress.  HENT:  Head: Normocephalic and atraumatic.  Eyes: EOM are normal.  Neck: Normal range of motion. Neck supple.  Cardiovascular: Normal rate.   Pulmonary/Chest: Effort normal.  Musculoskeletal: He exhibits edema and tenderness.  Moderate swelling to the left knee. Tenderness to palpation throughout. Pain with knee flexion, limited to 90 degrees due to pain. Pedal pulses 2+. Full ROM in left foot. Plantar flexion 4/5 bilaterally. Unable to bear weight.   Neurological: He is alert and oriented to person, place, and time.  Skin: Skin is warm and dry. He is not diaphoretic.  Psychiatric: He has a normal mood and affect. His behavior is normal.    ED Course  Procedures  DIAGNOSTIC STUDIES: Oxygen Saturation is 98% on RA, normal by my interpretation.    COORDINATION OF CARE: 10:17 PM Discussed treatment plan with pt  at bedside and pt agreed to plan.   Labs Review Labs Reviewed - No data to display Imaging Review No results found.  MDM   1. Left knee pain   2. Knee effusion, left    Pt c/o gradually worsening left knee pain and swelling.  Hx of patella fracture several years ago w/o new injuries.  Has not f/u with orthopedics recently.  Sees Dr. Lovell Sheehan for chronic back pain.  Knee does appear moderately swollen and is TTP. Pt denies new injuries, hx of gout, fever, n/v.  No further workup needed at this time.  Will giver pain meds, knee sleeve, crutches. Call to make a f/u with [facility] for further evaluation and tx of knee pain. AAll questions answered and concerns addressed. Return  precautions given. Pt verbalized understanding and agreement with tx plan. Vitals: unremarkable. Discharged in stable condition.     I personally performed the services described in this documentation, which was scribed in my presence. The recorded information has been reviewed and is accurate.   Junius Finner, PA-C 05/01/13 0007

## 2013-04-30 NOTE — ED Notes (Signed)
C/o L knee swelling and pain, swelling noted (minimal), CMS intact, ROM intact, pain with movement, has not seen ortho for this problem, "has been going on for months/ years".

## 2013-04-30 NOTE — ED Notes (Signed)
Pt up to b/r,walks with limp.

## 2013-04-30 NOTE — ED Notes (Signed)
Ortho tech at BS 

## 2013-04-30 NOTE — ED Notes (Signed)
No changes, steady gait, given pain med at time of d/c and referral.

## 2013-05-01 NOTE — ED Provider Notes (Signed)
Medical screening examination/treatment/procedure(s) were performed by non-physician practitioner and as supervising physician I was immediately available for consultation/collaboration.   Junius Argyle, MD 05/01/13 [phone]

## 2013-05-31 ENCOUNTER — Other Ambulatory Visit: Payer: Self-pay | Admitting: Neurosurgery

## 2013-05-31 DIAGNOSIS — M5416 Radiculopathy, lumbar region: Secondary | ICD-10-CM

## 2013-06-10 ENCOUNTER — Encounter (HOSPITAL_COMMUNITY): Payer: Self-pay | Admitting: Emergency Medicine

## 2013-06-10 ENCOUNTER — Emergency Department (EMERGENCY_DEPARTMENT_HOSPITAL)
Admission: EM | Admit: 2013-06-10 | Discharge: 2013-06-11 | Disposition: A | Discharge status: Other Acute Inpatient Hospital | Payer: Medicaid Other | Source: Home / Self Care | Attending: Emergency Medicine | Admitting: Emergency Medicine

## 2013-06-10 DIAGNOSIS — F1410 Cocaine abuse, uncomplicated: Secondary | ICD-10-CM | POA: Insufficient documentation

## 2013-06-10 DIAGNOSIS — F1020 Alcohol dependence, uncomplicated: Principal | ICD-10-CM | POA: Diagnosis present

## 2013-06-10 DIAGNOSIS — F1910 Other psychoactive substance abuse, uncomplicated: Secondary | ICD-10-CM

## 2013-06-10 DIAGNOSIS — Z79899 Other long term (current) drug therapy: Secondary | ICD-10-CM

## 2013-06-10 DIAGNOSIS — Z5900 Homelessness unspecified: Secondary | ICD-10-CM | POA: Insufficient documentation

## 2013-06-10 DIAGNOSIS — F411 Generalized anxiety disorder: Secondary | ICD-10-CM | POA: Diagnosis present

## 2013-06-10 DIAGNOSIS — F1420 Cocaine dependence, uncomplicated: Secondary | ICD-10-CM | POA: Diagnosis present

## 2013-06-10 DIAGNOSIS — F332 Major depressive disorder, recurrent severe without psychotic features: Secondary | ICD-10-CM | POA: Diagnosis present

## 2013-06-10 DIAGNOSIS — F1010 Alcohol abuse, uncomplicated: Secondary | ICD-10-CM | POA: Insufficient documentation

## 2013-06-10 LAB — RAPID URINE DRUG SCREEN, HOSP PERFORMED
Amphetamines: NOT DETECTED
Barbiturates: NOT DETECTED
Benzodiazepines: NOT DETECTED
Cocaine: POSITIVE — AB
Opiates: NOT DETECTED
Tetrahydrocannabinol: NOT DETECTED

## 2013-06-10 LAB — COMPREHENSIVE METABOLIC PANEL
ALT: 95 U/L — ABNORMAL HIGH (ref 0–53)
AST: 80 U/L — ABNORMAL HIGH (ref 0–37)
Albumin: 3.7 g/dL (ref 3.5–5.2)
Alkaline Phosphatase: 72 U/L (ref 39–117)
BUN: 12 mg/dL (ref 6–23)
CO2: 31 mEq/L (ref 19–32)
Calcium: 10.4 mg/dL (ref 8.4–10.5)
Chloride: 98 mEq/L (ref 96–112)
Creatinine, Ser: 1.13 mg/dL (ref 0.50–1.35)
GFR calc Af Amer: 80 mL/min — ABNORMAL LOW (ref >90)
GFR calc non Af Amer: 69 mL/min — ABNORMAL LOW (ref >90)
Glucose, Bld: 60 mg/dL — ABNORMAL LOW (ref 70–99)
Potassium: 4 mEq/L (ref 3.5–5.1)
Sodium: 137 mEq/L (ref 135–145)
Total Bilirubin: 0.3 mg/dL (ref 0.3–1.2)
Total Protein: 8.2 g/dL (ref 6.0–8.3)

## 2013-06-10 LAB — URINALYSIS, ROUTINE W REFLEX MICROSCOPIC
Bilirubin Urine: NEGATIVE
Glucose, UA: NEGATIVE mg/dL
Ketones, ur: NEGATIVE mg/dL
Leukocytes, UA: NEGATIVE
Nitrite: NEGATIVE
Protein, ur: NEGATIVE mg/dL
Urobilinogen, UA: 1 mg/dL (ref 0.0–1.0)
pH: 6.5 (ref 5.0–8.0)

## 2013-06-10 LAB — CBC
HCT: 44.7 % (ref 39.0–52.0)
Hemoglobin: 15.4 g/dL (ref 13.0–17.0)
MCH: 32.3 pg (ref 26.0–34.0)
MCHC: 34.5 g/dL (ref 30.0–36.0)
MCV: 93.7 fL (ref 78.0–100.0)
Platelets: 252 10*3/uL (ref 150–400)
RBC: 4.77 MIL/uL (ref 4.22–5.81)
RDW: 13.1 % (ref 11.5–15.5)
WBC: 5.9 10*3/uL (ref 4.0–10.5)

## 2013-06-10 LAB — ETHANOL: Alcohol, Ethyl (B): <11 mg/dL (ref 0–11)

## 2013-06-10 MED ORDER — ALUM & MAG HYDROXIDE-SIMETH 200-200-20 MG/5ML PO SUSP: 30.0000 mL | ORAL | Status: DC | PRN

## 2013-06-10 MED ORDER — ONDANSETRON HCL 4 MG PO TABS: 4.0000 mg | ORAL_TABLET | Freq: Three times a day (TID) | ORAL | Status: DC | PRN

## 2013-06-10 MED ORDER — IBUPROFEN 200 MG PO TABS: 600.0000 mg | ORAL_TABLET | Freq: Three times a day (TID) | ORAL | Status: DC | PRN

## 2013-06-10 MED ORDER — ZOLPIDEM TARTRATE 5 MG PO TABS: 5.0000 mg | ORAL_TABLET | Freq: Every evening | ORAL | Status: DC | PRN

## 2013-06-10 MED ORDER — ACETAMINOPHEN 325 MG PO TABS: 650.0000 mg | ORAL_TABLET | ORAL | Status: DC | PRN

## 2013-06-10 NOTE — ED Notes (Signed)
Pt would like detox from ETOH and crack. Last drink noted Sat morning. Pt has been sober for 14 months and began drinking again for 1 week. Pt drank a 6 pack beer per day for the past 7 days. Pt also last used crack on Saturday morning and has been using $150 per day for 1 week. Pt states his mom went to a nursing home in July for a stroke and the family said it was his fault for the stress he has put her through. Pt states he feels like he wanted to hurt him self and go see god before she did. Pt still is having SI. Pt has support person from [facility] at bedside. Pt called this person to let him know he has not sober anymore and support person felt urgency to bring pt in before pt hurt himself.

## 2013-06-10 NOTE — ED Notes (Signed)
Pt transferred from triage, presents for alcohol and crack cocaine detox.  Pt reports he drinks a 6-12 pack of beer per day and abused $700 of crack cocaine today.  Feeling hopeless at present.  Reports he would attempt suicide by taking an overdose of drugs.  Denies AV hallucinations or HI.  Pt reports his family has nothing to do with him, related to his mother having a stroke.  Pt calm & cooperative, tearful at present.  Pt is homeless.

## 2013-06-10 NOTE — ED Notes (Signed)
DON [facility] [phone] cell [phone]

## 2013-06-10 NOTE — Consult Note (Signed)
  Pt assessed by ACT Claims to be suicidal.Requesting detox from cocaine and alcohol.Pt has hx of chronic pain and receives regular opiate rx from Neurosurgeon Tressie Stalker for past year but he has also bee given additional opiate in ER and has no opiates in his UDS but reports a $700 cocaine habit while being homeless.Pt stated he had not taken any pain medication in a week.His ETOH level is normal/negative.There are currently no beds at [facility] The patient will be placed at first available facility with appropriate bed including [facility].
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
## 2013-06-10 NOTE — ED Provider Notes (Signed)
CSN: [id number]     Arrival date & time 06/10/13  1356 History  This chart was scribed for non-physician practitioner Carlyle Dolly, PA-C, working with Lyanne Co, MD, by Yevette Edwards, ED Scribe. This patient was seen in room WTR4/WLPT4 and the patient's care was started at 2:33 PM.   First MD Initiated Contact with Patient 06/10/13 1430     Chief Complaint  Patient presents with  . Medical Clearance    The history is provided by the patient and medical records. No language interpreter was used.   HPI Comments: [name] is a 62 y.o. male who presents to the Emergency Department for the purpose of medical clearance. The pt reports that after his mother's surgery several months ago and subsequent stay in a nursing home, he felt guilty about his mother's condition and, last week, began to drink alcohol and use crack-cocaine after 14 months of sobriety. He last used crack-cocaine yesterday morning, and he states he has been  approximately $150 a day on crack-cocaine for the past week. This week, he has also been drinking a  6 pack of beer a day. He denies any current SI. However, he states that he has experienced SI recently. He denies any other health concerns. He also denies smoking cigarettes.  Past Medical History  Diagnosis Date  . Back pain    Past Surgical History  Procedure Laterality Date  . Appendectomy    . Lung surgery    . Neck surgery     No family history on file. History  Substance Use Topics  . Smoking status: Never Smoker   . Smokeless tobacco: Not on file  . Alcohol Use: 21.6 oz/week    36 Cans of beer per week    Review of Systems A complete 10 system review of systems was obtained, and all systems were negative except where indicated in the HPI and PE.   Allergies  Review of patient's allergies indicates no known allergies.  Home Medications   Current Outpatient Rx  Name  Route  Sig  Dispense  Refill  . naproxen sodium (ANAPROX) 220 MG  tablet   Oral   Take 440 mg by mouth 2 (two) times daily with a meal.         . oxyCODONE (OXY IR/ROXICODONE) 5 MG immediate release tablet   Oral   Take 1 tablet (5 mg total) by mouth every 6 (six) hours as needed for pain.   20 tablet   0   . oxyCODONE-acetaminophen (PERCOCET/ROXICET) 5-325 MG per tablet      Take 1-2 pills every 4-6 hours as needed for pain.   15 tablet   0    Triage Vitals: BP 131/88  Pulse 94  Temp(Src) 98.2 F (36.8 C) (Oral)  Resp 16  SpO2 97%  Physical Exam  Nursing note and vitals reviewed. Constitutional: He is oriented to person, place, and time. He appears well-developed and well-nourished. No distress.  HENT:  Head: Normocephalic and atraumatic.  Eyes: EOM are normal.  Neck: Neck supple. No tracheal deviation present.  Cardiovascular: Normal rate.   Pulmonary/Chest: Effort normal. No respiratory distress.  Musculoskeletal: Normal range of motion.  Neurological: He is alert and oriented to person, place, and time.  Skin: Skin is warm and dry.  Psychiatric: He has a normal mood and affect. His behavior is normal.    ED Course  Procedures (including critical care time)  DIAGNOSTIC STUDIES: Oxygen Saturation is 97% on room air, normal  by my interpretation.    COORDINATION OF CARE:  2:38 PM- Discussed treatment plan with patient, and the patient agreed to the plan.   Labs Review Labs Reviewed  COMPREHENSIVE METABOLIC PANEL - Abnormal; Notable for the following:    Glucose, Bld 60 (*)    AST 80 (*)    ALT 95 (*)    GFR calc non Af Amer 69 (*)    GFR calc Af Amer 80 (*)    All other components within normal limits  CBC  ETHANOL  URINE RAPID DRUG SCREEN (HOSP PERFORMED)     Carlyle Dolly, PA-C 06/10/13 1648

## 2013-06-10 NOTE — ED Notes (Signed)
Report called to [facility] RN in secured area
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
## 2013-06-11 ENCOUNTER — Inpatient Hospital Stay (HOSPITAL_COMMUNITY): Admission: AD | Admit: 2013-06-11 | Payer: Medicaid Other | Source: Intra-hospital

## 2013-06-11 ENCOUNTER — Encounter (HOSPITAL_COMMUNITY): Payer: Self-pay | Admitting: Intensive Care

## 2013-06-11 ENCOUNTER — Inpatient Hospital Stay (HOSPITAL_COMMUNITY)
Admission: AD | Admit: 2013-06-11 | Discharge: 2013-06-18 | DRG: 897 | Disposition: A | Discharge status: Home / Self Care | Payer: Medicaid Other | Source: Intra-hospital | Attending: Psychiatry | Admitting: Psychiatry

## 2013-06-11 DIAGNOSIS — F1020 Alcohol dependence, uncomplicated: Secondary | ICD-10-CM | POA: Diagnosis present

## 2013-06-11 DIAGNOSIS — F39 Unspecified mood [affective] disorder: Secondary | ICD-10-CM

## 2013-06-11 DIAGNOSIS — F1910 Other psychoactive substance abuse, uncomplicated: Secondary | ICD-10-CM

## 2013-06-11 DIAGNOSIS — F1994 Other psychoactive substance use, unspecified with psychoactive substance-induced mood disorder: Secondary | ICD-10-CM | POA: Diagnosis present

## 2013-06-11 DIAGNOSIS — F1420 Cocaine dependence, uncomplicated: Secondary | ICD-10-CM | POA: Diagnosis present

## 2013-06-11 LAB — HEPATITIS PANEL, ACUTE
Hep A IgM: NONREACTIVE
Hepatitis B Surface Ag: NEGATIVE

## 2013-06-11 MED ORDER — ALUM & MAG HYDROXIDE-SIMETH 200-200-20 MG/5ML PO SUSP: 30.0000 mL | ORAL | Status: DC | PRN

## 2013-06-11 MED ORDER — ONDANSETRON HCL 4 MG PO TABS: 4.0000 mg | ORAL_TABLET | Freq: Three times a day (TID) | ORAL | Status: DC | PRN

## 2013-06-11 MED ORDER — ACETAMINOPHEN 325 MG PO TABS
650.0000 mg | ORAL_TABLET | Freq: Four times a day (QID) | ORAL | Status: DC | PRN
  Administered 2013-06-12 – 2013-06-17 (×4): 650 mg via ORAL
  Filled 2013-06-11 (×4): qty 2

## 2013-06-11 MED ORDER — ONDANSETRON 4 MG PO TBDP: 4.0000 mg | ORAL_TABLET | Freq: Four times a day (QID) | ORAL | Status: AC | PRN

## 2013-06-11 MED ORDER — HYDROXYZINE HCL 25 MG PO TABS
25.0000 mg | ORAL_TABLET | Freq: Four times a day (QID) | ORAL | Status: AC | PRN
  Filled 2013-06-11: qty 1

## 2013-06-11 MED ORDER — CHLORDIAZEPOXIDE HCL 25 MG PO CAPS
25.0000 mg | ORAL_CAPSULE | Freq: Once | ORAL | Status: AC
  Administered 2013-06-11: 25 mg via ORAL
  Filled 2013-06-11: qty 1

## 2013-06-11 MED ORDER — CHLORDIAZEPOXIDE HCL 25 MG PO CAPS
25.0000 mg | ORAL_CAPSULE | Freq: Four times a day (QID) | ORAL | Status: AC
  Administered 2013-06-11 – 2013-06-13 (×6): 25 mg via ORAL
  Filled 2013-06-11 (×6): qty 1

## 2013-06-11 MED ORDER — IBUPROFEN 600 MG PO TABS
600.0000 mg | ORAL_TABLET | Freq: Three times a day (TID) | ORAL | Status: DC | PRN
  Administered 2013-06-12 – 2013-06-14 (×3): 600 mg via ORAL
  Filled 2013-06-11 (×3): qty 1

## 2013-06-11 MED ORDER — MAGNESIUM HYDROXIDE 400 MG/5ML PO SUSP: 30.0000 mL | Freq: Every day | ORAL | Status: DC | PRN

## 2013-06-11 MED ORDER — ZOLPIDEM TARTRATE 5 MG PO TABS
5.0000 mg | ORAL_TABLET | Freq: Every evening | ORAL | Status: DC | PRN
  Administered 2013-06-11 – 2013-06-12 (×2): 5 mg via ORAL
  Filled 2013-06-11 (×2): qty 1

## 2013-06-11 MED ORDER — LOPERAMIDE HCL 2 MG PO CAPS: 2.0000 mg | ORAL_CAPSULE | ORAL | Status: AC | PRN

## 2013-06-11 MED ORDER — CHLORDIAZEPOXIDE HCL 25 MG PO CAPS
25.0000 mg | ORAL_CAPSULE | Freq: Three times a day (TID) | ORAL | Status: AC
  Administered 2013-06-13 – 2013-06-14 (×3): 25 mg via ORAL
  Filled 2013-06-11 (×3): qty 1

## 2013-06-11 MED ORDER — THIAMINE HCL 100 MG/ML IJ SOLN
100.0000 mg | Freq: Once | INTRAMUSCULAR | Status: AC
  Administered 2013-06-11: 100 mg via INTRAMUSCULAR
  Filled 2013-06-11: qty 2

## 2013-06-11 MED ORDER — CHLORDIAZEPOXIDE HCL 25 MG PO CAPS
25.0000 mg | ORAL_CAPSULE | Freq: Every day | ORAL | Status: AC
  Administered 2013-06-16: 25 mg via ORAL
  Filled 2013-06-11: qty 1

## 2013-06-11 MED ORDER — CHLORDIAZEPOXIDE HCL 25 MG PO CAPS
25.0000 mg | ORAL_CAPSULE | ORAL | Status: AC
  Administered 2013-06-14 – 2013-06-15 (×2): 25 mg via ORAL
  Filled 2013-06-11 (×2): qty 1

## 2013-06-11 MED ORDER — ADULT MULTIVITAMIN W/MINERALS CH
1.0000 | ORAL_TABLET | Freq: Every day | ORAL | Status: DC
  Administered 2013-06-11 – 2013-06-17 (×7): 1 via ORAL
  Filled 2013-06-11 (×9): qty 1

## 2013-06-11 MED ORDER — VITAMIN B-1 100 MG PO TABS
100.0000 mg | ORAL_TABLET | Freq: Every day | ORAL | Status: DC
  Administered 2013-06-12 – 2013-06-17 (×6): 100 mg via ORAL
  Filled 2013-06-11: qty 1
  Filled 2013-06-11: qty 14
  Filled 2013-06-11 (×7): qty 1

## 2013-06-11 MED ORDER — LORAZEPAM 1 MG PO TABS
1.0000 mg | ORAL_TABLET | Freq: Four times a day (QID) | ORAL | Status: DC | PRN
  Administered 2013-06-11 – 2013-06-15 (×5): 1 mg via ORAL
  Filled 2013-06-11 (×4): qty 1

## 2013-06-11 NOTE — Progress Notes (Signed)
Underwriter intiated inpatient placement on behalf of pt.  The following hospitals were faxed referrals with bed availability: 1)[facility] 2)[facility] 3)[facility] 4)[facility]  Blain Pais, MHT/NS
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
## 2013-06-11 NOTE — Tx Team (Signed)
Initial Interdisciplinary Treatment Plan  PATIENT STRENGTHS: (choose at least two) Ability for insight Average or above average intelligence Capable of independent living Communication skills Motivation for treatment/growth  PATIENT STRESSORS: Financial difficulties Health problems Marital or family conflict Medication change or noncompliance Occupational concerns Substance abuse   PROBLEM LIST: Problem List/Patient Goals Date to be addressed Date deferred Reason deferred Estimated date of resolution  ETOH Dependence 06/11/13                                                      DISCHARGE CRITERIA:  Ability to meet basic life and health needs Adequate post-discharge living arrangements Improved stabilization in mood, thinking, and/or behavior Medical problems require only outpatient monitoring  PRELIMINARY DISCHARGE PLAN: Attend aftercare/continuing care group Outpatient therapy  PATIENT/FAMIILY INVOLVEMENT: This treatment plan has been presented to and reviewed with the patient, [name].  The patient has been given the opportunity to ask questions and make suggestions.  Nestor Ramp [facility] 06/11/2013, 3:54 PM

## 2013-06-11 NOTE — Progress Notes (Signed)
Patient admitted to [facility] from [facility]. Patient states that he is here for "detox from alcohol and depression." Patinet was drinking six-pack of beer daily and using cocaine. Patient states that this is a relapse for him as he previously had 9 months of sobriety. Patient states that his mother has had a stroke "because of 'his' drinking". The patient states that his relationship with his family is currently strained because of this. The patient is homeless and states that he is unsure of where to go on discharge. Patient currently denies SI/HI and auditory and visual hallucinations. The patient has a depressed mood and affect. The patient was given education regarding unit rules and regulations. The patient was educated regarding fall risk status (moderate). Patient was escorted to unit by RN and MHT. Will continue to monitor patient for safety.

## 2013-06-11 NOTE — BHH Counselor (Signed)
Per Thurman Coyer [facility] at [facility], pt has been accepted by Fayette Pho NP to Dr. Dub Mikes to bed 303-1. Pt's RN Irven Shelling NP notified. Completed paperwork faxed to [facility] and originals placed in pt's chart.  Evette Cristal, [place] Assessment Counselor

## 2013-06-11 NOTE — Progress Notes (Signed)
Patient ID: [name], male   DOB: [birth year], 62 y.o.   MRN: [id number] D: Pt. Lying in bed, reports "got some things I need to take care of" Pt. Reports been drinking more this past week and been drinking since he was [age]. A: Writer introduced self to client and reviewed medications. Writer encouraged group. Staff will monitor q67min for safety. R: pt. Did not attend group. Pt. Is safe on the unit.

## 2013-06-12 ENCOUNTER — Telehealth (HOSPITAL_BASED_OUTPATIENT_CLINIC_OR_DEPARTMENT_OTHER): Payer: Self-pay | Admitting: Emergency Medicine

## 2013-06-12 ENCOUNTER — Encounter (HOSPITAL_COMMUNITY): Payer: Self-pay | Admitting: Psychiatry

## 2013-06-12 DIAGNOSIS — F1420 Cocaine dependence, uncomplicated: Secondary | ICD-10-CM | POA: Diagnosis present

## 2013-06-12 DIAGNOSIS — F1994 Other psychoactive substance use, unspecified with psychoactive substance-induced mood disorder: Secondary | ICD-10-CM | POA: Diagnosis present

## 2013-06-12 DIAGNOSIS — F1020 Alcohol dependence, uncomplicated: Principal | ICD-10-CM | POA: Diagnosis present

## 2013-06-12 NOTE — Progress Notes (Signed)
Recreation Therapy Notes  Date: 11.11.2014 Time: 2:30pm Location: [address]  Group Topic: Animal Assisted Activities (AAA)  Behavioral Response: Did not attend.    Elanah Osmanovic L Johnay Mano, LRT/CTRS  Randal Goens L 06/12/2013 5:08 PM 

## 2013-06-12 NOTE — BHH Group Notes (Signed)
[facility] LCSW Group Therapy  06/12/2013 2:38 PM  Type of Therapy:  Group Therapy  Participation Level:  Did Not Attend   Smart, Herbert Seta 06/12/2013, 2:38 PM
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
## 2013-06-12 NOTE — Progress Notes (Signed)
Adult Psychoeducational Group Note  Date:  06/12/2013 Time:  1:10 PM  Group Topic/Focus:  Recovery Goals:   The focus of this group is to identify appropriate goals for recovery and establish a plan to achieve them.  Participation Level:  Did Not Attend  Additional Comments:  Pt was encouraged to come to group but pt stayed in the bed and slept.   Cathlean Cower 06/12/2013, 1:10 PM

## 2013-06-12 NOTE — Consult Note (Signed)
Agreeable with plan

## 2013-06-12 NOTE — [facility] Group Notes (Signed)
[facility] Group Notes:  (Nursing/MHT/Case Management/Adjunct)  Date:  06/12/2013  Time:  11:08 AM  Type of Therapy:  Nurse Education  Participation Level:  Active  Participation Quality:  Appropriate and Sharing  Affect:  Anxious, Appropriate, Depressed and Flat  Cognitive:  Alert and Appropriate  Insight:  Appropriate and Good  Engagement in Group:  Supportive  Modes of Intervention:  Activity, Discussion and Problem-solving  Summary of Progress/Problems: Participated well in group. Opened up about problems but was able to be redirected to topic  Andres Ege 06/12/2013, 11:08 AM

## 2013-06-12 NOTE — [facility] Suicide Risk Assessment (Signed)
Suicide Risk Assessment  Admission Assessment     Nursing information obtained from:    Demographic factors:    Current Mental Status:    Loss Factors:    Historical Factors:    Risk Reduction Factors:     CLINICAL FACTORS:   Depression:   Comorbid alcohol abuse/dependence Alcohol/Substance Abuse/Dependencies  COGNITIVE FEATURES THAT CONTRIBUTE TO RISK:  Closed-mindedness Polarized thinking Thought constriction (tunnel vision)    SUICIDE RISK:   Moderate:  Frequent suicidal ideation with limited intensity, and duration, some specificity in terms of plans, no associated intent, good self-control, limited dysphoria/symptomatology, some risk factors present, and identifiable protective factors, including available and accessible social support.  PLAN OF CARE: Supportive approach/coping skills/relapse prevention                               Librium detox protocol/reassess and address the co morbidities  I certify that inpatient services furnished can reasonably be expected to improve the patient's condition.  Annaleah Arata A 06/12/2013, 7:25 PM

## 2013-06-13 DIAGNOSIS — F1410 Cocaine abuse, uncomplicated: Secondary | ICD-10-CM

## 2013-06-13 MED ORDER — TRAZODONE HCL 50 MG PO TABS
50.0000 mg | ORAL_TABLET | Freq: Every evening | ORAL | Status: DC | PRN
  Administered 2013-06-13: 50 mg via ORAL
  Filled 2013-06-13: qty 1

## 2013-06-13 NOTE — [facility] Suicide Risk Assessment (Addendum)
[facility] INPATIENT:  Family/Significant Other Suicide Prevention Education  Suicide Prevention Education:  Contact Attempts: Daveion Robar (pt's daughter) cell: [phone] has been identified by the patient as the family member/significant other with whom the patient will be residing, and identified as the person(s) who will aid the patient in the event of a mental health crisis.  With written consent from the patient, two attempts were made to provide suicide prevention education, prior to and/or following the patient's discharge.  We were unsuccessful in providing suicide prevention education.  A suicide education pamphlet was given to the patient to share with family/significant other.  Date and time of first attempt: 9:45AM 06/13/13 Date and time of second attempt: 12:55PM 06/13/13 (voicemail left for pt's daughter)  [name]  06/13/2013, 12:55 PM   Pt's daughter, [name] called CSW. SPE completed with pt's daughter.  [employer], LCSWA 06/13/2013

## 2013-06-13 NOTE — Progress Notes (Signed)
Patient ID: [name], male   DOB: [birth year], 62 y.o.   MRN: [id number] He has been up this AM and to some groups poor interaction  With peers and staff. He voiced that he was anxious and that he feli that he could harm self. Spoke with him and he contracted for safety. Ativan prn was given at 11:50am and he has slept his afternoon.  after he woke he has denies withdrawal  Symptoms an d SI thoughts.
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
## 2013-06-13 NOTE — Progress Notes (Signed)
Recreation Therapy Notes  Date: 11.11.2014 Time: 3:00pm Location: [address]   Group Topic: Communication, Team Building, Problem Solving  Goal Area(s) Addresses:  Patient will effectively work with peer towards shared goal.  Patient will identify skill used to make activity successful.  Patient will identify how skills used during activity can be used to reach post d/c goals.   Behavioral Response: Did not attend.   Imya Mance L Tamikka Pilger, LRT/CTRS  Sharae Zappulla L 06/13/2013 8:13 AM 

## 2013-06-13 NOTE — [facility] Group Notes (Signed)
[facility] LCSW Group Therapy  06/13/2013 2:20 PM  Type of Therapy:  Group Therapy  Participation Level:  Did Not Attend   Smart, Sadiya Durand 06/13/2013, 2:20 PM  

## 2013-06-13 NOTE — Progress Notes (Signed)
D: Patient in his room in bed on approach.  Patient did not attend group tonight but he became visible on the unit for snacks and medications.  Patient denies SI/HI and AVH.  However patient states is he was on the street he cannot promise that he would not do something to hurt himself.  Patient states as long as he is here getting help he with safe.   A: Staff to monitor Q 15 mins for safety.  Encouragement and support offered.  Scheduled medications administered per orders. R: Patient remains safe on the unit.  Patient did not attend group tonight.  Patient visible on the unit during snack and medications pass.  Patient taking administered medications.

## 2013-06-13 NOTE — BHH Group Notes (Signed)
[facility] LCSW Aftercare Discharge Planning Group Note   06/13/2013 10:42 AM  Participation Quality:  Appropriate   Mood/Affect:  Appropriate  Depression Rating:  9  Anxiety Rating:  9  Thoughts of Suicide:  No Will you contract for safety?   NA  Current AVH:  No  Plan for Discharge/Comments:  Pt reports that he had been staying at the [facility] for a little over one month. He states that after seeing his mother after she suffered two strokes and being blamed for her condition by family members, he relapsed on cocaine and alcohol after over a year of sobriety. Pt would like [facility] admission/screening date and plans to follow up at [facility] for med management. Pt reports severe withdrawals today including: bad headache, extreme shakiness, and "absolutley no sleep lastnight." Pt reports that he had been working with two case managers at the [employer] to secure more stable housing.   Transportation Means: bus   Supports: case workers at [employer]; strained relationship with family/daughter but pt gave consent for CSW to contact his daughter to inform her of his condition, treatment plan, etc.   Smart, [employer]

## 2013-06-13 NOTE — Progress Notes (Addendum)
Adult Psychoeducational Group Note  Date:  06/13/2013 Time:  1:13 PM  Group Topic/Focus:  Personal Choices and Values:   The focus of this group is to help patients assess and explore the importance of values in their lives, how their values affect their decisions, how they express their values and what opposes their expression.  Participation Level:  Active  Participation Quality:  Attentive and Supportive  Affect:  Depressed and Flat  Cognitive:  Oriented  Insight: Improving  Engagement in Group:  Engaged  Modes of Intervention:  Discussion, Education and Support  Additional Comments:  Pt participated in group and was verbal about feeling depressed. He shared that he was glad he was here and hopes to go onto another program for additional recovery. "If I were to go home now I'm afraid I would go back to drinking.".  Reynolds Bowl 06/13/2013, 1:13 PM

## 2013-06-13 NOTE — Progress Notes (Signed)
[facility] MD Progress Note  06/13/2013 7:21 PM Justo Hengel  MRN:  [id number] Subjective:  Dallen continues to have a hard time. States that he cant get out of her mind the image of his mother. States he is still upset with the way he was treated blamed for the strokes his mother suffered. He did not sleep last night. 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   HENT: Negative.   Eyes: Negative.   Respiratory: Negative.   Cardiovascular: Negative.   Gastrointestinal: Negative.   Genitourinary: Negative.   Musculoskeletal: Positive for back pain and joint pain.  Skin: Negative.   Neurological: Negative.   Endo/Heme/Allergies: Negative.   Psychiatric/Behavioral: Positive for depression and substance abuse. The patient is nervous/anxious.     Blood pressure 114/73, pulse 98, temperature 98.2 F (36.8 C), temperature source Oral, resp. rate 16, height 6' (1.829 m), weight 81.647 kg (180 lb), SpO2 99.00%.Body mass index is 24.41 kg/(m^2).  General Appearance: Disheveled  Eye Solicitor::  Fair  Speech:  Clear and Coherent and Slow  Volume:  Decreased  Mood:  Depressed and worried  Affect:  Restricted  Thought Process:  Coherent and Goal Directed  Orientation:  Full (Time, Place, and  Person)  Thought Content:  historical facts, events, symptoms  Suicidal Thoughts:  No  Homicidal Thoughts:  No  Memory:  Immediate;   Fair Recent;   Fair Remote;   Fair  Judgement:  Fair  Insight:  Shallow  Psychomotor Activity:  Restlessness  Concentration:  Fair  Recall:  Fair  Akathisia:  No  Handed:    AIMS (if indicated):     Assets:  Desire for Improvement  Sleep:  Number of Hours: 6.25   Current Medications: Current Facility-Administered Medications  Medication Dose Route Frequency Provider Last Rate Last Dose  . acetaminophen (TYLENOL) tablet 650 mg  650 mg Oral Q6H PRN Earney Navy, NP   650 mg at 06/13/13 0835  . alum & mag hydroxide-simeth (MAALOX/MYLANTA) 200-200-20 MG/5ML suspension 30 mL  30 mL Oral Q4H PRN Earney Navy, NP      . chlordiazePOXIDE (LIBRIUM) capsule 25 mg  25 mg Oral TID Cleotis Nipper, MD   25 mg at 06/13/13 1701   Followed by  . [START ON 06/14/2013] chlordiazePOXIDE (LIBRIUM) capsule 25 mg  25 mg Oral BH-qamhs Cleotis Nipper, MD       Followed by  . [START ON 06/16/2013] chlordiazePOXIDE (LIBRIUM) capsule 25 mg  25 mg Oral Daily Cleotis Nipper, MD      . hydrOXYzine (ATARAX/VISTARIL) tablet 25 mg  25 mg Oral Q6H PRN Earney Navy, NP      . ibuprofen (ADVIL,MOTRIN) tablet 600 mg  600 mg Oral Q8H PRN Earney Navy, NP   600 mg  at 06/13/13 1202  . loperamide (IMODIUM) capsule 2-4 mg  2-4 mg Oral PRN Earney Navy, NP      . LORazepam (ATIVAN) tablet 1 mg  1 mg Oral Q6H PRN Earney Navy, NP   1 mg at 06/13/13 1202  . magnesium hydroxide (MILK OF MAGNESIA) suspension 30 mL  30 mL Oral Daily PRN Earney Navy, NP      . multivitamin with minerals tablet 1 tablet  1 tablet Oral Daily Earney Navy, NP   1 tablet at 06/13/13 [phone]  . ondansetron (ZOFRAN) tablet 4 mg  4 mg Oral Q8H PRN Earney Navy, NP      . ondansetron (ZOFRAN-ODT) disintegrating tablet 4 mg  4 mg Oral Q6H PRN Earney Navy, NP      .  thiamine (VITAMIN B-1) tablet 100 mg  100 mg Oral Daily Earney Navy, NP   100 mg at 06/13/13 0832  . traZODone (DESYREL) tablet 50 mg  50 mg Oral QHS PRN Rachael Fee, MD        Lab Results: No results found for this or any previous visit (from the past 48 hour(s)).  Physical Findings: AIMS: Facial and Oral Movements Muscles of Facial Expression: None, normal Lips and Perioral Area: None, normal Jaw: None, normal Tongue: None, normal,Extremity Movements Upper (arms, wrists, hands, fingers): None, normal Lower (legs, knees, ankles, toes): None, normal, Trunk Movements Neck, shoulders, hips: None, normal, Overall Severity Severity of abnormal movements (highest score from questions above): None, normal Incapacitation due to abnormal movements: None, normal Patient's awareness of abnormal movements (rate only patient's report): No Awareness, Dental Status Current problems with teeth and/or dentures?: No Does patient usually wear dentures?: No  CIWA:  CIWA-Ar Total: 7 COWS:     Treatment Plan Summary: Daily contact with patient to assess and evaluate symptoms and progress in treatment Medication management  Plan: Supportive approach/coping skills/relapse prevention            CBT mindfulness           Detox/reassess co morbidities  Medical Decision Making Problem Points:  Review of psycho-social stressors (1) Data Points:  Review of medication regiment & side effects (2)  I certify that inpatient services furnished can reasonably be expected to improve the patient's condition.   Mikiya Nebergall A 06/13/2013, 7:21 PM

## 2013-06-13 NOTE — Tx Team (Signed)
Interdisciplinary Treatment Plan Update (Adult)  Date: 06/13/2013   Time Reviewed: 10:45 AM  Progress in Treatment:  Attending groups: Yes  Participating in groups:   Yes  Taking medication as prescribed: Yes  Tolerating medication: Yes  Family/Significant othe contact made: Not yet. SPE required for this pt.  Patient understands diagnosis: Yes, AEB seeking treatment for mood stabilization, SI with plan to "drink myself to death" and ETOH detox/substance abuse (cocaine).  Discussing patient identified problems/goals with staff: Yes  Medical problems stabilized or resolved: Yes  Denies suicidal/homicidal ideation: Yes during group/self report.   Patient has not harmed self or Others: Yes  New problem(s) identified: Pt experiencing severe withdrawals today.  Discharge Plan or Barriers: Pt hoping for screening/possible admission into [facility] and plans to follow up at [facility] for med management. Pt also working with 2 case workers through the [employer] on more permanent housing.  Additional comments: [name] is an 62 y.o. male. Pt presents to [facility] reporting SI. Pt reports his mother had a stroke in July and his entire family is blaming the stroke on pt due to the stress he puts on his mother. Pt reports he saw his mother one week ago and was so upset with how she looked that he blamed himself. Pt reports that he became suicidal after the visit and continues to have SI currently, no plan, but he cannot contract for safety at this time. Pt reports he had been clean and sober for 14 months but he relapsed on alcohol and cocaine the same day he visited his mother. Pt reports he has been drinking 6-12 beers per day for the past week and has used $700 in cocaine over that same time period. Pt denies any withdrawal symptoms. Pt denies HI/AV. UDS positive for cocaine only. Pt has numerous recent prescriptions for opiate pain meds but reports he has not used them in the past week. Pt was living  at homeless shelter prior to beginning his binge, reports he has been staying at the crack house this past week Reason for Continuation of Hospitalization: Librium taper-withdrawals Mood stabilization Estimated length of stay: 2-4 days  For review of initial/current patient goals, please see plan of care.  Attendees:  Patient:    Family:    Physician: Geoffery Lyons MD 06/13/2013 10:45 AM   Nursing: Lupita Leash RN  06/13/2013 10:45 AM   Clinical Social Worker [employer], LCSWA  06/13/2013 10:45 AM   Other: Maureen Ralphs RN 06/13/2013 10:45 AM   Other: Darden Dates Nurse CM 06/13/2013 10:45 AM   Other: Massie Kluver, Community Care Coordinator  06/13/2013 10:45 AM   Other:    Scribe for Treatment Team:  [employer] LCSWA 06/13/2013 10:45 AM

## 2013-06-13 NOTE — Progress Notes (Signed)
D: Pt passive SI, but contracts for safety.  Pt denies HI/AVH/pain. Pt is pleasant and cooperative. Pt remained in bed most of the night.   A: Pt was offered support and encouragement. Pt was given scheduled medications. Pt was encourage to attend groups. Q 15 minute checks were done for safety.   R:Pt is taking medication. Pt has no complaints.Pt receptive to treatment and safety maintained on unit.

## 2013-06-13 NOTE — Progress Notes (Signed)
Did not attended group 

## 2013-06-14 MED ORDER — GABAPENTIN 300 MG PO CAPS
300.0000 mg | ORAL_CAPSULE | Freq: Three times a day (TID) | ORAL | Status: DC
  Administered 2013-06-14 – 2013-06-17 (×10): 300 mg via ORAL
  Filled 2013-06-14: qty 42
  Filled 2013-06-14 (×7): qty 1
  Filled 2013-06-14: qty 42
  Filled 2013-06-14 (×5): qty 1
  Filled 2013-06-14: qty 42
  Filled 2013-06-14 (×5): qty 1

## 2013-06-14 MED ORDER — ASPIRIN-ACETAMINOPHEN-CAFFEINE 250-250-65 MG PO TABS
2.0000 | ORAL_TABLET | Freq: Four times a day (QID) | ORAL | Status: DC | PRN
  Administered 2013-06-15: 2 via ORAL
  Filled 2013-06-14: qty 1
  Filled 2013-06-14: qty 20
  Filled 2013-06-14: qty 1

## 2013-06-14 MED ORDER — TRAZODONE HCL 100 MG PO TABS
100.0000 mg | ORAL_TABLET | Freq: Every evening | ORAL | Status: DC | PRN
  Administered 2013-06-14: 100 mg via ORAL
  Filled 2013-06-14: qty 1

## 2013-06-14 NOTE — Progress Notes (Signed)
D: Patient in the hallway on approach.  Patient appears brighter.  Patient states today he has felt like he was in a fog.  Patient states he has felt drowsy and states sometimes when he is moving around he gets dizzy.  Patient states he wants to get his life together because if he goes out on the street right now he does not know what will happen.  Patient states his daughter called him today and he states this was a great feeling for him.    Patient denies SI/HI and AVH. A: Staff to monitor Q 15 mins for safety.  Encouragement and support offered.  Scheduled medications administered per orders.  Trazodone administered prn for sleep. R: Patient remains safe on the unit.  Patient attended group tonight.  Patient visible on the unit and interacting with peers.  Patient taking administered medications.

## 2013-06-14 NOTE — Progress Notes (Signed)
[facility] MD Progress Note  06/14/2013 6:52 PM Danyon Mcginness  MRN:  [id number] Subjective:  Kyrus endorses that he is still dealing with the trauma of what he went trough with her mother's illness and the way he was treated by his family. He still has dreams about her. He wakes up from those dreams and cant go back to sleep. He had a call from his daughter but he could not talk to her. He is surprised by her wanting to get in touch with him. Diagnosis:   DSM5: Schizophrenia Disorders:  none Obsessive-Compulsive Disorders:  none Trauma-Stressor Disorders:  none Substance/Addictive Disorders:  Alcohol Related Disorder - Severe (303.90), Cocaine Related Disorder Depressive Disorders:  Major Depressive Disorder - Moderate (296.22)  Axis I: Substance Induced Mood Disorder  ADL's:  Intact  Sleep: Poor  Appetite:  Fair  Suicidal Ideation:  Plan:  denies Intent:  denies Means:  denies Homicidal Ideation:  Plan:  denies Intent:  denies Means:  denies AEB (as evidenced by):  Psychiatric Specialty Exam: Review of Systems  Constitutional: Negative.   HENT: Negative.   Eyes: Negative.   Respiratory: Negative.   Cardiovascular: Negative.   Gastrointestinal: Negative.   Genitourinary: Negative.   Musculoskeletal: Positive for back pain and joint pain.  Skin: Negative.   Neurological: Negative.   Endo/Heme/Allergies: Negative.   Psychiatric/Behavioral: Positive for depression and substance abuse. The patient is nervous/anxious and has insomnia.     Blood pressure 131/84, pulse 86, temperature 97.6 F (36.4 C), temperature source Oral, resp. rate 20, height 6' (1.829 m), weight 81.647 kg (180 lb), SpO2 100.00%.Body mass index is 24.41 kg/(m^2).  General Appearance: Disheveled  Eye Solicitor::  Fair  Speech:  Clear and Coherent  Volume:  Decreased  Mood:  Anxious, Depressed and worried  Affect:  anxious, sad  Thought Process:  Coherent and Goal Directed  Orientation:  Full (Time, Place,  and Person)  Thought Content:  worries, concerns, ruminations  Suicidal Thoughts:  intermittent  Homicidal Thoughts:  No  Memory:  Immediate;   Fair Recent;   Fair Remote;   Fair  Judgement:  Fair  Insight:  Shallow  Psychomotor Activity:  Restlessness  Concentration:  Fair  Recall:  Fair  Akathisia:  No  Handed:    AIMS (if indicated):     Assets:  Desire for Improvement  Sleep:  Number of Hours: 6.75   Current Medications: Current Facility-Administered Medications  Medication Dose Route Frequency Provider Last Rate Last Dose  . acetaminophen (TYLENOL) tablet 650 mg  650 mg Oral Q6H PRN Earney Navy, NP   650 mg at 06/13/13 0835  . alum & mag hydroxide-simeth (MAALOX/MYLANTA) 200-200-20 MG/5ML suspension 30 mL  30 mL Oral Q4H PRN Earney Navy, NP      . aspirin-acetaminophen-caffeine (EXCEDRIN MIGRAINE) per tablet 2 tablet  2 tablet Oral Q6H PRN Rachael Fee, MD      . chlordiazePOXIDE (LIBRIUM) capsule 25 mg  25 mg Oral BH-qamhs Cleotis Nipper, MD       Followed by  . [START ON 06/16/2013] chlordiazePOXIDE (LIBRIUM) capsule 25 mg  25 mg Oral Daily Cleotis Nipper, MD      . gabapentin (NEURONTIN) capsule 300 mg  300 mg Oral TID Rachael Fee, MD   300 mg at 06/14/13 1712  . LORazepam (ATIVAN) tablet 1 mg  1 mg Oral Q6H PRN Earney Navy, NP   1 mg at 06/13/13 1202  . magnesium hydroxide (MILK OF MAGNESIA) suspension  30 mL  30 mL Oral Daily PRN Earney Navy, NP      . multivitamin with minerals tablet 1 tablet  1 tablet Oral Daily Earney Navy, NP   1 tablet at 06/14/13 0821  . ondansetron (ZOFRAN) tablet 4 mg  4 mg Oral Q8H PRN Earney Navy, NP      . thiamine (VITAMIN B-1) tablet 100 mg  100 mg Oral Daily Earney Navy, NP   100 mg at 06/14/13 0821  . traZODone (DESYREL) tablet 100 mg  100 mg Oral QHS PRN Rachael Fee, MD        Lab Results: No results found for this or any previous visit (from the past 48 hour(s)).  Physical  Findings: AIMS: Facial and Oral Movements Muscles of Facial Expression: None, normal Lips and Perioral Area: None, normal Jaw: None, normal Tongue: None, normal,Extremity Movements Upper (arms, wrists, hands, fingers): None, normal Lower (legs, knees, ankles, toes): None, normal, Trunk Movements Neck, shoulders, hips: None, normal, Overall Severity Severity of abnormal movements (highest score from questions above): None, normal Incapacitation due to abnormal movements: None, normal Patient's awareness of abnormal movements (rate only patient's report): No Awareness, Dental Status Current problems with teeth and/or dentures?: No Does patient usually wear dentures?: No  CIWA:  CIWA-Ar Total: 5 COWS:     Treatment Plan Summary: Daily contact with patient to assess and evaluate symptoms and progress in treatment Medication management  Plan: Supportive approach/coping skills/relapse prevention           Continue detox           Reassess and address the co morbidities           Increase the Trazodone to 100 mg HS  Medical Decision Making Problem Points:  Review of psycho-social stressors (1) Data Points:  Review of medication regiment & side effects (2) Review of new medications or change in dosage (2)  I certify that inpatient services furnished can reasonably be expected to improve the patient's condition.   Bradan Congrove A 06/14/2013, 6:52 PM

## 2013-06-14 NOTE — Progress Notes (Signed)
Pt attended AA speaker meeting.

## 2013-06-14 NOTE — Progress Notes (Signed)
Recreation Therapy Notes  Date: 11.13.2014 Time: 3:00pm Location: [address]  Group Topic: Leisure Education, Team Work  Goal Area(s) Addresses:  Patient will identify positive benefits of leisure.  Behavioral Response: Did not attend.   Marykay Lex Ivy Meriwether, LRT/CTRS  Bridney Guadarrama L 06/14/2013 4:30 PM

## 2013-06-14 NOTE — Progress Notes (Signed)
Adult Psychoeducational Group Note  Date:  06/14/2013 Time:  4:07 PM  Group Topic/Focus:  Overcoming Stress:   The focus of this group is to define stress and help patients assess their triggers.  Participation Level:  Active  Participation Quality:  Monopolizing  Affect:  Appropriate  Cognitive:  Oriented  Insight: Improving and Lacking  Engagement in Group:  Pt monopolized the group at times but was redirectable. He often repeats himself but is pleasant .  Modes of Intervention:  Discussion, Education and Support  Additional Comments:  Pt expressed, " I am a lonely person". He talked about how his family relationships were a stressor to him. He engaged and participated freely.  Reynolds Bowl 06/14/2013, 4:07 PM

## 2013-06-14 NOTE — Progress Notes (Signed)
Recreation Therapy Notes  Date: 11.13.2014 Time: 2:30pm Location: [address]  Group Topic: Software engineer Activities (AAA)  Behavioral Response: Did not attend.   Marykay Lex Tabby Beaston, LRT/CTRS  Jearl Klinefelter 06/14/2013 4:45 PM

## 2013-06-14 NOTE — Progress Notes (Signed)
Adult Psychoeducational Group Note  Date:  06/14/2013 Time:  9:42 AM  Group Topic/Focus:  Goals Group:   The focus of this group is to help patients establish daily goals to achieve during treatment and discuss how the patient can incorporate goal setting into their daily lives to aide in recovery. Orientation:   The focus of this group is to educate the patient on the purpose and policies of crisis stabilization and provide a format to answer questions about their admission.  The group details unit policies and expectations of patients while admitted.  Participation Level:  Active  Participation Quality:  Appropriate and Sharing  Affect:  Appropriate  Cognitive:  Appropriate  Insight: Appropriate  Engagement in Group:  Engaged  Modes of Intervention:  Orientation  Additional Comments:  Pt stated his goal for today is to be nice to everyone and to stay on track and do the right thing.  Caswell Corwin 06/14/2013, 9:42 AM

## 2013-06-14 NOTE — Progress Notes (Signed)
Attended group 

## 2013-06-14 NOTE — [facility] Group Notes (Signed)
[facility] LCSW Group Therapy  06/14/2013 2:41 PM  Type of Therapy:  Group Therapy  Participation Level:  Active  Participation Quality:  Attentive  Affect:  Depressed and Tearful  Cognitive:  Alert and Oriented  Insight:  Improving  Engagement in Therapy:  Engaged  Modes of Intervention:  Confrontation, Discussion, Education, Exploration, Socialization and Support  Summary of Progress/Problems:  Finding Balance in Life. Today's group focused on defining balance in one's own words, identifying things that can knock one off balance, and exploring healthy ways to maintain balance in life. Group members were asked to provide an example of a time when they felt off balance, describe how they handled that situation,and process healthier ways to regain balance in the future. Group members were asked to share the most important tool for maintaining balance that they learned while at [facility] and how they plan to apply this method after discharge. [name] was engaged and attentive throughout today's therapy group. [name] stated that to him, Life balance meant "being able to do things that I enjoy, feeling satisfaction in life, and having a relationship with my family that is positive." [name] was asked to explore how he can regain this balance in various areas. [name] demonstrates improving insight and shows progress in the group setting AEB his ability to process how "getting detoxed, going to treatment, and taking care of myself" are realistic and positive goals that will help him in reestablishing a sense of life balance.    Smart, [name] 06/14/2013, 2:41 PM

## 2013-06-14 NOTE — ED Provider Notes (Signed)
Medical screening examination/treatment/procedure(s) were conducted as a shared visit with non-physician practitioner(s) and myself.  I personally evaluated the patient during the encounter.  EKG Interpretation   None        Well appearing. Medically cleared. No signs of active withdrawl  Lyanne Co, MD 06/14/13 2320

## 2013-06-14 NOTE — Progress Notes (Signed)
Patient ID: [name], male   DOB: [birth year], 62 y.o.   MRN: [id number] He has been up and to groups today interacting more with staff and peers. Self inventory: depression 9 hopelessness, positive craving, denies SI thoughts. Stated that he realized last night that if he harmed himself he would be loosing time with his daughter and grandson.

## 2013-06-15 DIAGNOSIS — F411 Generalized anxiety disorder: Secondary | ICD-10-CM

## 2013-06-15 MED ORDER — RAMELTEON 8 MG PO TABS
8.0000 mg | ORAL_TABLET | Freq: Every day | ORAL | Status: DC
  Administered 2013-06-15 – 2013-06-17 (×3): 8 mg via ORAL
  Filled 2013-06-15 (×4): qty 1
  Filled 2013-06-15: qty 14
  Filled 2013-06-15: qty 1

## 2013-06-15 MED ORDER — MIRTAZAPINE 15 MG PO TABS
15.0000 mg | ORAL_TABLET | Freq: Every evening | ORAL | Status: DC | PRN
  Administered 2013-06-15: 15 mg via ORAL
  Filled 2013-06-15: qty 1
  Filled 2013-06-15: qty 14

## 2013-06-15 NOTE — Progress Notes (Signed)
D.  Pt pleasant and bright on approach, denies complaints at this time.  Denies SI/HI/hallucinations at this time.  Interacting appropriately within milieu.  Positive for evening AA group.  Requested that more of his clothes be gotten from his suitcase because his jeans were washed and now they are too tight.  A.  Support and encouragement offered, clothes delivered to Pt from suitcase as requested.  R.  Pt remains safe playing cards with peers on unit, will continue to monitor.

## 2013-06-15 NOTE — Consult Note (Signed)
Pt was seen with NP. Agree with above assessment and plan.  Rulon Abdalla, MD 

## 2013-06-15 NOTE — [facility] Group Notes (Signed)
[facility] LCSW Group Therapy  06/15/2013 2:35 PM  Type of Therapy:  Group Therapy  Participation Level:  Active  Participation Quality:  Attentive and Monopolizing  Affect:  Depressed  Cognitive:  Lacking  Insight:  Engineer, mining  Engagement in Therapy:  Production designer, theatre/television/film of Intervention:  Confrontation, Discussion, Education, Exploration, Socialization and Support  Summary of Progress/Problems: Feelings around Relapse. Group members discussed the meaning of relapse and shared personal stories of relapse, how it affected them and others, and how they perceived themselves during this time. Group members were encouraged to identify triggers, warning signs and coping skills used when facing the possibility of relapse. Social supports were discussed and explored in detail. Post Acute Withdrawal Syndrome (handout provided) was introduced and examined. Pt's were encouraged to ask questions, talk about key points associated with PAWS, and process this information in terms of relapse prevention. [name] was attentive and engaged throughout today's therapy group. [name] stated that to him, relapse meant "when I decide to give up, or when I have thoughts of killing myself by drinking myself to death." [name] shows limited progress in the group setting AEB his inability to remain on-topic and respond to redirection by group facilitators. [name] was resistant to redirection and demonstrated tangential thinking throughout today's group. [name] demonstrates limited insight AEB his difficulty in acknowledging his level of control over himself and family members. [name] focused on how negative his family members are and the "horrible way they treat me." [name] was receptive to other group members as they assisted him with processing how he has control over how he chooses to respond to these family members, whether it is by drinking to numb the pain and anger or responding with positive  coping skills.    [name], [name] 06/15/2013, 2:35 PM

## 2013-06-15 NOTE — Progress Notes (Signed)
Patient ID: [name], male   DOB: Mar 23, 1951, 62 y.o.   MRN: [id number] D. Patient presents with depressed mood, affect flat. Pt very quiet, withdrawn and isolative throughout shift thus far, minimal interaction noted with peers and not forthcoming to Clinical research associate.Patient did state in am '' I'm still feeling bad, I didn't sleep at all last night and I'm still shaky and nervous '' Patient also complained of incontinent episode x 1 last night stating '' I don't know if I have a UTI or if I was just out of it from the sleeping medicines but I wet the bed last night. Patient completed self inventory and rates depression at 9/10 on depression scale, 10 being worst depression 1 being least. Patient denies SI, states '' I don't have a home to go to, and I want help with drugs and alcohol I don't know what to do no more '' A. Support and encouragement provided.medications given as ordered. Discussed above information with MD./Treatment team. R. Patient remains isolative, withdrawn. No further voiced concerns at this time. Will continue to monitor q 15 minutes for safety.

## 2013-06-15 NOTE — Tx Team (Signed)
Interdisciplinary Treatment Plan Update (Adult)  Date: 06/15/2013   Time Reviewed: 10:41 AM  Progress in Treatment:  Attending groups: Yes  Participating in groups:   Yes  Taking medication as prescribed: Yes  Tolerating medication: Yes  Family/Significant othe contact made: SPE completed with pt's daughter.  Patient understands diagnosis: Yes, AEB seeking treatment for mood stabilization, SI with plan to "drink myself to death" and ETOH detox/substance abuse (cocaine).  Discussing patient identified problems/goals with staff: Yes  Medical problems stabilized or resolved: Yes  Denies suicidal/homicidal ideation: Yes during group/self report.   Patient has not harmed self or Others: Yes  New problem(s) identified: Pt experiencing moderate withdrawals today and reports severe headache. MD made aware of pt's request for decrease in sleep meds-med changes.  Discharge Plan or Barriers: Pt has [facility] screening for admission on Monday morning at 8am. MD made aware that pt will require 14 day med supply and will require SRA completion by Sunday (after 3PM). PT will take bus to [facility]-CSW to call [facility] to let them know he will need transport from [employer].  Additional comments: n/a  Reason for Continuation of Hospitalization: Librium taper-withdrawals Mood stabilization Medication managment Estimated length of stay: 3 days (d/c Monday by 6:15AM in order to get to [facility] in time for 8am screening).   For review of initial/current patient goals, please see plan of care.  Attendees:  Patient:    Family:    Physician: Geoffery Lyons MD 06/15/2013 10:41 AM   Nursing: Mardella Layman RN  06/15/2013 10:41 AM   Clinical Social Worker Amaryllis Malmquist Smart, LCSWA  06/15/2013 10:41 AM   Other:  06/15/2013 10:41 AM   Other: Darden Dates Nurse CM 06/15/2013 10:41 AM   Other: Massie Kluver, Community Care Coordinator  06/15/2013 10:41 AM   Other:    Scribe for Treatment Team:  [employer]  LCSWA 06/15/2013 10:41 AM

## 2013-06-15 NOTE — Progress Notes (Signed)
Adult Psychoeducational Group Note  Date:  06/15/2013 Time:  2:53 PM  Group Topic/Focus:  Recovery Goals:   The focus of this group is to identify appropriate goals for recovery and establish a plan to achieve them.  Participation Level:  Active  Participation Quality:  Intrusive and Monopolizing  Affect:  Depressed  Cognitive:  Alert and Lacking  Insight: Lacking  Engagement in Group:  Limited and Monopolizing  Modes of Intervention:  Discussion, Education, Exploration and Support  Additional Comments: Pt attended group and often participating by monopolizing the conversation about things he has reference often, his family's disdain for him and his thoughts of killing himself prior to admission. He is easily redirected.   Reynolds Bowl 06/15/2013, 2:53 PM

## 2013-06-15 NOTE — Progress Notes (Signed)
Adult Psychoeducational Group Note  Date:  06/15/2013 Time:  10:38 AM  Group Topic/Focus:  Therapeutic Actvity  Participation Level:  Active  Participation Quality:  Appropriate  Affect:  Appropriate  Cognitive:  Appropriate  Insight: Appropriate  Engagement in Group:  Engaged  Modes of Intervention:  Activity  Additional Comments:  Pts played "Sings of Stress Bingo" and received peppermint candy for winning.   Tora Perches N 06/15/2013, 10:38 AM

## 2013-06-15 NOTE — Progress Notes (Signed)
[facility] Adult Case Management Discharge Plan :  Will you be returning to the same living situation after discharge: No. [facility] screening on Monday 11/17 8am/possible admit if bed available.  At discharge, do you have transportation home?:Yes,  bus pass in chart. CSW left message for [name] at [facility] 11/14 at 11:05AM requesting transportation from [place] to facility at 8am Monday. Do you have the ability to pay for your medications:Yes,  Medicaid  Pt signed releases and they have been turned in to Medical records by CSW (not in chart) **  Patient to Follow up at: Follow-up Information   Follow up with [facility]. (Arrive by 8:00AM for screening. Make sure to bring 14 day medication supply, ID, and clothing. If screening goes well and there is bed available, you will be admitted on this day. )    Contact information:   [address] [place], [place] [address] Phone: [phone] Fax: [phone]      Follow up with [facility]. (Walk in between 8am-9am Monday through Friday for hospital follow-up/medication management. )    Contact information:   [address], [place] [address] Phone: [phone] Fax: [phone]      Patient denies SI/HI:   Yes,  during group/self report.    Safety Planning and Suicide Prevention discussed:  Yes,  SPE completed with pt's daughter. SPI pamphlet provided to pt and he was encouraged to share this with his support network, ask questions, and talk about any concerns.  Smart, [name] 06/15/2013, 11:06 AM

## 2013-06-15 NOTE — Progress Notes (Signed)
[facility] MD Progress Note  06/15/2013 4:30 PM [name]  MRN:  [id number] Subjective:  Continues to be very overwhelmed. He experienced enuresis last night. Felt the Trazodone made him sleep so hard he lost control of his sphincter. He ws still tired, groggy in the morning. Would like to try something different. He is still dealing with depression, worry. He cant take out of his mind the image of his mother when she saw her after the second stroke. Says he sees her skeleton and this is very upsetting. He is feeling hopeless, helpless, completely overwhelmed. If he was not to have the bed at [facility] and was let loose states he would relapse and end it all Diagnosis:   DSM5: Schizophrenia Disorders:  none Obsessive-Compulsive Disorders:  none Trauma-Stressor Disorders:  none Substance/Addictive Disorders:  Alcohol Related Disorder - Severe (303.90), Cocaine related disorder Depressive Disorders:  Major Depressive Disorder - Moderate (296.22)  Axis I: Anxiety Disorder NOS  ADL's:  Intact  Sleep: Poor  Appetite:  Fair  Suicidal Ideation:  Plan:  denies Intent:  denies Means:  denies Homicidal Ideation:  Plan:  denies Intent:  denies Means:  denies AEB (as evidenced by):  Psychiatric Specialty Exam: Review of Systems  HENT: Negative.   Eyes: Negative.   Respiratory: Negative.   Cardiovascular: Negative.   Gastrointestinal: Negative.   Genitourinary: Negative.   Musculoskeletal: Positive for back pain and joint pain.  Skin: Negative.   Neurological: Positive for weakness.  Endo/Heme/Allergies: Negative.   Psychiatric/Behavioral: Positive for depression and substance abuse. The patient is nervous/anxious and has insomnia.     Blood pressure 104/74, pulse 88, temperature 97 F (36.1 C), temperature source Oral, resp. rate 20, height 6' (1.829 m), weight 81.647 kg (180 lb), SpO2 100.00%.Body mass index is 24.41 kg/(m^2).  General Appearance: Fairly Groomed  Patent attorney::  Fair   Speech:  Clear and Coherent  Volume:  Decreased  Mood:  Anxious, Depressed and worried  Affect:  anxious, worried  Thought Process:  Coherent and Goal Directed  Orientation:  Full (Time, Place, and Person)  Thought Content:  worries, concerns  Suicidal Thoughts:  Intermittent  Homicidal Thoughts:  No  Memory:  Immediate;   Fair Recent;   Fair Remote;   Fair  Judgement:  Fair  Insight:  Present  Psychomotor Activity:  Restlessness  Concentration:  Fair  Recall:  Fair  Akathisia:  No  Handed:    AIMS (if indicated):     Assets:  Desire for Improvement  Sleep:  Number of Hours: 5.75   Current Medications: Current Facility-Administered Medications  Medication Dose Route Frequency Provider Last Rate Last Dose  . acetaminophen (TYLENOL) tablet 650 mg  650 mg Oral Q6H PRN Earney Navy, NP   650 mg at 06/13/13 0835  . alum & mag hydroxide-simeth (MAALOX/MYLANTA) 200-200-20 MG/5ML suspension 30 mL  30 mL Oral Q4H PRN Earney Navy, NP      . aspirin-acetaminophen-caffeine (EXCEDRIN MIGRAINE) per tablet 2 tablet  2 tablet Oral Q6H PRN Rachael Fee, MD   2 tablet at 06/15/13 [phone]  . [START ON 06/16/2013] chlordiazePOXIDE (LIBRIUM) capsule 25 mg  25 mg Oral Daily Cleotis Nipper, MD      . gabapentin (NEURONTIN) capsule 300 mg  300 mg Oral TID Rachael Fee, MD   300 mg at 06/15/13 1209  . LORazepam (ATIVAN) tablet 1 mg  1 mg Oral Q6H PRN Earney Navy, NP   1 mg at 06/15/13 1210  .  magnesium hydroxide (MILK OF MAGNESIA) suspension 30 mL  30 mL Oral Daily PRN Earney Navy, NP      . multivitamin with minerals tablet 1 tablet  1 tablet Oral Daily Earney Navy, NP   1 tablet at 06/15/13 0834  . ondansetron (ZOFRAN) tablet 4 mg  4 mg Oral Q8H PRN Earney Navy, NP      . thiamine (VITAMIN B-1) tablet 100 mg  100 mg Oral Daily Earney Navy, NP   100 mg at 06/15/13 0833  . traZODone (DESYREL) tablet 100 mg  100 mg Oral QHS PRN Rachael Fee, MD   100 mg at  06/14/13 2156    Lab Results: No results found for this or any previous visit (from the past 48 hour(s)).  Physical Findings: AIMS: Facial and Oral Movements Muscles of Facial Expression: None, normal Lips and Perioral Area: None, normal Jaw: None, normal Tongue: None, normal,Extremity Movements Upper (arms, wrists, hands, fingers): None, normal Lower (legs, knees, ankles, toes): None, normal, Trunk Movements Neck, shoulders, hips: None, normal, Overall Severity Severity of abnormal movements (highest score from questions above): None, normal Incapacitation due to abnormal movements: None, normal Patient's awareness of abnormal movements (rate only patient's report): No Awareness, Dental Status Current problems with teeth and/or dentures?: No Does patient usually wear dentures?: No  CIWA:  CIWA-Ar Total: 4 COWS:     Treatment Plan Summary: Daily contact with patient to assess and evaluate symptoms and progress in treatment Medication management  Plan: Supportive approach/coping skills/relapse prevention           CBT; Mindfulness           Reassess and address the co morbidities            D/C Trazodone                  Medical Decision Making Problem Points:  Review of psycho-social stressors (1) Data Points:  Review of medication regiment & side effects (2)  I certify that inpatient services furnished can reasonably be expected to improve the patient's condition.   Zilphia Kozinski A 06/15/2013, 4:30 PM

## 2013-06-15 NOTE — [facility] Group Notes (Signed)
[facility] LCSW Aftercare Discharge Planning Group Note   06/15/2013 9:42 AM  Participation Quality:  Appropriate   Mood/Affect:  Depressed  Depression Rating:  7  Anxiety Rating:  9  Thoughts of Suicide:  No Will you contract for safety?   NA  Current AVH:  No  Plan for Discharge/Comments:  Pt is reporting moderate-severe withdrawals today- severe headache; smelling cocaine; dizziness. Pt has [facility] bed screening Monday morning and is likely to d/c on Monday morning. MD made aware that we need 14 day med supply by this time. Pt requesting reduction in sleep meds-MD notified during Treatment team.    Transportation Means: bus   Supports: limited family supports/pastor  Smart, Research scientist (physical sciences)

## 2013-06-16 DIAGNOSIS — F332 Major depressive disorder, recurrent severe without psychotic features: Secondary | ICD-10-CM

## 2013-06-16 DIAGNOSIS — F1010 Alcohol abuse, uncomplicated: Secondary | ICD-10-CM

## 2013-06-16 NOTE — Progress Notes (Signed)
[facility] MD Progress Note  06/16/2013 3:18 PM [name]  MRN:  [id number] Subjective:  Patient stated his medications are making him feel too drowsy--Ativan discontinued, Librium taper in place.  He states he is depressed and knows he is safe here but knows if he goes back out, he will not be safe.  [facility] scheduled for Monday, feels safe knowing he is going to [facility].  Appetite is fair. Diagnosis:   DSM5:  Substance/Addictive Disorders:  Alcohol Related Disorder - Severe (303.90) and Alcohol Intoxication with Use Disorder - Severe (F10.229) Depressive Disorders:  Major Depressive Disorder  Axis I: Alcohol Abuse, Anxiety Disorder NOS and Major Depression, Recurrent severe Axis II: Deferred Axis III:  Past Medical History  Diagnosis Date  . Back pain    Axis IV: economic problems, housing problems, other psychosocial or environmental problems, problems related to social environment and problems with primary support group Axis V: 41-50 serious symptoms  ADL's:  Intact  Sleep: Fair  Appetite:  Fair  Suicidal Ideation:  Plan:  None Intent:  yes Means:  none Homicidal Ideation:  Denies   Psychiatric Specialty Exam: Review of Systems  Constitutional: Negative.   HENT: Negative.   Eyes: Negative.   Respiratory: Negative.   Cardiovascular: Negative.   Gastrointestinal: Negative.   Genitourinary: Negative.   Musculoskeletal: Negative.   Skin: Negative.   Neurological: Negative.   Endo/Heme/Allergies: Negative.   Psychiatric/Behavioral: Positive for depression and substance abuse. The patient is nervous/anxious.     Blood pressure 126/80, pulse 84, temperature 97 F (36.1 C), temperature source Oral, resp. rate 18, height 6' (1.829 m), weight 81.647 kg (180 lb), SpO2 100.00%.Body mass index is 24.41 kg/(m^2).  General Appearance: Casual  Eye Contact::  Fair  Speech:  Normal Rate  Volume:  Normal  Mood:  Anxious and Depressed  Affect:  Congruent  Thought Process:   Coherent  Orientation:  Full (Time, Place, and Person)  Thought Content:  WDL  Suicidal Thoughts:  Yes.  with intent/plan  Homicidal Thoughts:  No  Memory:  Immediate;   Fair Recent;   Fair Remote;   Fair  Judgement:  Poor  Insight:  Lacking  Psychomotor Activity:  Decreased  Concentration:  Fair  Recall:  Fair  Akathisia:  No  Handed:  Right  AIMS (if indicated):     Assets:  Resilience  Sleep:  Number of Hours: 6.25   Current Medications: Current Facility-Administered Medications  Medication Dose Route Frequency Provider Last Rate Last Dose  . acetaminophen (TYLENOL) tablet 650 mg  650 mg Oral Q6H PRN Earney Navy, NP   650 mg at 06/15/13 1815  . alum & mag hydroxide-simeth (MAALOX/MYLANTA) 200-200-20 MG/5ML suspension 30 mL  30 mL Oral Q4H PRN Earney Navy, NP      . aspirin-acetaminophen-caffeine (EXCEDRIN MIGRAINE) per tablet 2 tablet  2 tablet Oral Q6H PRN Rachael Fee, MD   2 tablet at 06/15/13 [phone]  . gabapentin (NEURONTIN) capsule 300 mg  300 mg Oral TID Rachael Fee, MD   300 mg at 06/16/13 1212  . magnesium hydroxide (MILK OF MAGNESIA) suspension 30 mL  30 mL Oral Daily PRN Earney Navy, NP      . mirtazapine (REMERON) tablet 15 mg  15 mg Oral QHS PRN Rachael Fee, MD   15 mg at 06/15/13 2147  . multivitamin with minerals tablet 1 tablet  1 tablet Oral Daily Earney Navy, NP   1 tablet at 06/16/13 0855  .  ondansetron (ZOFRAN) tablet 4 mg  4 mg Oral Q8H PRN Earney Navy, NP      . ramelteon (ROZEREM) tablet 8 mg  8 mg Oral QHS Rachael Fee, MD   8 mg at 06/15/13 2146  . thiamine (VITAMIN B-1) tablet 100 mg  100 mg Oral Daily Earney Navy, NP   100 mg at 06/16/13 1610    Lab Results: No results found for this or any previous visit (from the past 48 hour(s)).  Physical Findings: AIMS: Facial and Oral Movements Muscles of Facial Expression: None, normal Lips and Perioral Area: None, normal Jaw: None, normal Tongue: None,  normal,Extremity Movements Upper (arms, wrists, hands, fingers): None, normal Lower (legs, knees, ankles, toes): None, normal, Trunk Movements Neck, shoulders, hips: None, normal, Overall Severity Severity of abnormal movements (highest score from questions above): None, normal Incapacitation due to abnormal movements: None, normal Patient's awareness of abnormal movements (rate only patient's report): No Awareness, Dental Status Current problems with teeth and/or dentures?: No Does patient usually wear dentures?: No  CIWA:  CIWA-Ar Total: 2 COWS:     Treatment Plan Summary: Daily contact with patient to assess and evaluate symptoms and progress in treatment Medication management  Plan:  Review of chart, vital signs, medications, and notes. 1-Individual and group therapy 2-Medication management for depression, substance abuse, and anxiety:  Medications reviewed with the patient and his Ativan PRN discontinued due to his complaint of feeling too drowsy 3-Coping skills for depression, anxiety, and substance abuse 4-Continue crisis stabilization and management 5-Address health issues--monitoring vital signs, stable 6-Treatment plan in progress to prevent relapse of depression, substance abuse, and anxiety  Medical Decision Making Problem Points:  Established problem, stable/improving (1) and Review of psycho-social stressors (1) Data Points:  Review of new medications or change in dosage (2)  I certify that inpatient services furnished can reasonably be expected to improve the patient's condition.   Nanine Means, PMH-NP 06/16/2013, 3:18 PM  Reviewed the information documented and agree with the treatment plan.  Valentine Kuechle,JANARDHAHA R. 06/16/2013 5:06 PM

## 2013-06-16 NOTE — Progress Notes (Signed)
D- Patient is lethargic and c/o "feeling bad" today.  He is isolative to room.  Rates depression and hopelessness at 2.  "Off and on" SI and contracts for safety.  A- Support and encouragement offered.  Dagan Heinz that his librium protocol was over and that fatigue could be a possible side affect. Encouraged fluids.  Continue current POC and evaluation of treatment goals. Continue 15' checks for safety.  R- Safety maintained.

## 2013-06-16 NOTE — Progress Notes (Signed)
Continues to c/o generalized weakness.  Held back at meal time.  VSS.  Fluids encouraged.. Continue to assess.

## 2013-06-16 NOTE — [facility] Group Notes (Signed)
[facility] Group Notes:  (Nursing/MHT/Case Management/Adjunct)  Date:  06/16/2013  Time:  1315  Type of Therapy:  Did not attend  Participation Level:    Participation Quality:    Affect:    Cognitive:    Insight:    Engagement in Group:    Modes of Intervention:    Summary of Progress/Problems:  [name] 06/16/2013, 4:33 PM

## 2013-06-16 NOTE — Progress Notes (Signed)
Pt did not attend AA group, he is currently in bed sleeping.

## 2013-06-16 NOTE — [facility] Group Notes (Signed)
[facility] Group Notes: (Clinical Social Work)   06/16/2013      Type of Therapy:  Group Therapy   Participation Level:  Did Not Attend    Ambrose Mantle, LCSW 06/16/2013, 12:11 PM

## 2013-06-17 DIAGNOSIS — F329 Major depressive disorder, single episode, unspecified: Secondary | ICD-10-CM

## 2013-06-17 MED ORDER — THIAMINE HCL 100 MG PO TABS: 100.0000 mg | ORAL_TABLET | Freq: Every day | ORAL | Status: DC

## 2013-06-17 MED ORDER — RAMELTEON 8 MG PO TABS: 8.0000 mg | ORAL_TABLET | Freq: Every day | ORAL | Status: DC

## 2013-06-17 MED ORDER — GABAPENTIN 300 MG PO CAPS: 300.0000 mg | ORAL_CAPSULE | Freq: Three times a day (TID) | ORAL | Status: DC

## 2013-06-17 MED ORDER — ASPIRIN-ACETAMINOPHEN-CAFFEINE 250-250-65 MG PO TABS: 2.0000 | ORAL_TABLET | Freq: Four times a day (QID) | ORAL | Status: DC | PRN

## 2013-06-17 MED ORDER — MIRTAZAPINE 15 MG PO TABS: 15.0000 mg | ORAL_TABLET | Freq: Every evening | ORAL | Status: DC | PRN

## 2013-06-17 NOTE — Progress Notes (Signed)
D-Patient is brighter and states "I feel much better today".  Gait is much steadier. Is anticipating sucessful d/c in am. Denies SI.  Denies overt s/s of etoh withdrawal.  A-Support and encouragement offered.  Continue current POC and evaluation of treatment goals. Continue 15' checks for safety.  R-Safety maintained.

## 2013-06-17 NOTE — Progress Notes (Signed)
[facility] MD Progress Note  06/17/2013 12:01 PM [name]  MRN:  [id number] Subjective:  Patient's sleep "pretty good", appetite "good", depression "little bit", denies suicidal/homicidal ideations and hallucinations.  He is scheduled to go to [facility] tomorrow to continue his alcohol rehab, he states he is "ready". 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 Back pain    Axis IV: other psychosocial or environmental problems, problems related to social environment and problems with primary support group Axis V: 41-50 serious symptoms  ADL's:  Intact  Sleep: Good  Appetite:  Good  Suicidal Ideation:  Denies Homicidal Ideation:  Denies  Psychiatric Specialty Exam: Review of Systems  Constitutional: Negative.   HENT: Negative.   Eyes: Negative.   Respiratory: Negative.   Cardiovascular: Negative.   Gastrointestinal: Negative.   Genitourinary: Negative.   Musculoskeletal: Negative.   Skin: Negative.   Neurological: Negative.   Endo/Heme/Allergies: Negative.   Psychiatric/Behavioral: Positive for depression and substance abuse. The patient is nervous/anxious.     Blood pressure 117/79, pulse 82, temperature 97.4 F (36.3 C), temperature source Oral, resp. rate 18, height 6' (1.829 m), weight 81.647 kg (180 lb), SpO2 100.00%.Body mass index is 24.41 kg/(m^2).  General Appearance: Casual  Eye Contact::  Fair  Speech:  Normal Rate  Volume:  Normal  Mood:  Anxious and Depressed  Affect:  Congruent  Thought Process:  Coherent  Orientation:  Full (Time, Place, and Person)  Thought Content:  WDL  Suicidal Thoughts:  No  Homicidal Thoughts:  No  Memory:   Immediate;   Fair Recent;   Fair Remote;   Fair  Judgement:  Fair  Insight:  Fair  Psychomotor Activity:  Normal  Concentration:  Fair  Recall:  Fair  Akathisia:  No  Handed:  Right  AIMS (if indicated):     Assets:  Resilience  Sleep:  Number of Hours: 6.25   Current Medications: Current Facility-Administered Medications  Medication Dose Route Frequency Provider Last Rate Last Dose  . acetaminophen (TYLENOL) tablet 650 mg  650 mg Oral Q6H PRN Earney Navy, NP   650 mg at 06/15/13 1815  . alum & mag hydroxide-simeth (MAALOX/MYLANTA) 200-200-20 MG/5ML suspension 30 mL  30 mL Oral Q4H PRN Earney Navy, NP      . aspirin-acetaminophen-caffeine (EXCEDRIN MIGRAINE) per tablet 2 tablet  2 tablet Oral Q6H PRN Rachael Fee, MD   2 tablet at 06/15/13 [id number]  . gabapentin (NEURONTIN) capsule 300 mg  300 mg Oral TID Rachael Fee, MD   300 mg at 06/16/13 1212  . magnesium hydroxide (MILK OF MAGNESIA) suspension 30 mL  30 mL Oral Daily PRN Earney Navy, NP      . mirtazapine (REMERON) tablet 15 mg  15 mg Oral QHS PRN Rachael Fee, MD   15 mg at 06/15/13 2147  . multivitamin with minerals tablet 1 tablet  1 tablet Oral Daily Earney Navy, NP   1 tablet at 06/16/13 0855  . ondansetron (ZOFRAN) tablet 4 mg  4 mg Oral Q8H PRN Earney Navy, NP      . ramelteon (ROZEREM) tablet 8 mg  8 mg Oral QHS Rachael Fee, MD  8 mg at 06/17/13 0114  . thiamine (VITAMIN B-1) tablet 100 mg  100 mg Oral Daily Earney Navy, NP   100 mg at 06/16/13 6578    Lab Results: No results found for this or any previous visit (from the past 48 hour(s)).  Physical Findings: AIMS: Facial and Oral Movements Muscles of Facial Expression: None, normal Lips and Perioral Area: None, normal Jaw: None, normal Tongue: None, normal,Extremity Movements Upper (arms, wrists, hands, fingers): None, normal Lower (legs, knees, ankles, toes): None, normal, Trunk Movements Neck, shoulders, hips: None,  normal, Overall Severity Severity of abnormal movements (highest score from questions above): None, normal Incapacitation due to abnormal movements: None, normal Patient's awareness of abnormal movements (rate only patient's report): No Awareness, Dental Status Current problems with teeth and/or dentures?: No Does patient usually wear dentures?: No  CIWA:  CIWA-Ar Total: 2 COWS:     Treatment Plan Summary Daily contact with patient to assess and evaluate symptoms and progress in treatment Medication management  Plan:  Review of chart, vital signs, medications, and notes. 1-Individual and group therapy 2-Medication management for depression and anxiety:  Medications reviewed with the patient and he denies any untoward effects 3-Coping skills for depression, anxiety, and alcohol abuse 4-Continue crisis stabilization and management 5-Address health issues--monitoring vital signs, stable 6-Treatment plan in progress to prevent relapse of depression, alcohol abuse, and anxiety  Medical Decision Making Problem Points:  Established problem, stable/improving (1) and Review of psycho-social stressors (1) Data Points:  Review of medication regiment & side effects (2)  I certify that inpatient services furnished can reasonably be expected to improve the patient's condition.   Nanine Means, PMH-NP  06/17/2013, 12:01 PM  Reviewed the information documented and agree with the treatment plan.  Tomeca Helm,JANARDHAHA R. 06/18/2013 8:36 AM

## 2013-06-17 NOTE — [facility] Group Notes (Signed)
[facility] Group Notes: (Clinical Social Work)   06/17/2013      Type of Therapy:  Group Therapy   Participation Level:  Did Not Attend    Tramell Piechota Grossman-Orr, LCSW 06/17/2013, 12:22 PM     

## 2013-06-17 NOTE — Discharge Summary (Signed)
Physician Discharge Summary Note  Patient:  [name] is an 62 y.o., male MRN:  [id number] DOB:  [birth year] Patient phone:  [phone] (home)  Patient address:   [address] [place] [address],   Date of Admission:  06/11/2013 Date of Discharge: 06/18/2013  Reason for Admission:  Alcohol detox/dependency  Discharge Diagnoses: Active Problems:   Alcohol dependence   Cocaine dependence   Substance induced mood disorder  Review of Systems  Constitutional: Negative.   HENT: Negative.   Eyes: Negative.   Respiratory: Negative.   Cardiovascular: Negative.   Gastrointestinal: Negative.   Genitourinary: Negative.   Musculoskeletal: Negative.   Skin: Negative.   Neurological: Negative.   Endo/Heme/Allergies: Negative.   Psychiatric/Behavioral: Positive for substance abuse. The patient is nervous/anxious.     DSM5:  Substance/Addictive Disorders:  Alcohol Related Disorder - Severe (303.90), Alcohol Intoxication without Use Disorder (F10.929) and Alcohol Withdrawal (291.81) Depressive Disorders:  Major Depressive Disorder - Moderate (296.22)  Axis Diagnosis:   AXIS I:  Alcohol Abuse, Anxiety Disorder NOS and Major Depression, Recurrent severe AXIS II:  Deferred AXIS III:   Past Medical History  Diagnosis Date  . Back pain    AXIS IV:  other psychosocial or environmental problems, problems related to social environment and problems with primary support group AXIS V:  61-70 mild symptoms  Level of Care:  [facility]  Hospital Course:  On admission:  62 Y/O male who states his mother is in a nusing home she sufers from strokes. States that she was placed in an assisted living facility, he bacame upset as states his family blames him for her having the strokes. When his mother was placed, he was kicked out of the house Started back drinking and drugging. Drinks between a six to a twelve pack a day. states he uses crack cocaine. states he has been using several days  straight. He had been clean 14 months states he just quit on his own. Increasingly more depressed became more suicidal requested help.  During hospitalization:  [facility] alcohol detox completed successfully.  Excedrin every six hours PRN headaches, gabapentin 300 mg TID for neuropathic pain, Remeron 15 mg at bedtime for depression and sleep, and Rozerem 8 mg at bedtime for sleep.  [name] attended and participated in therapy.  He denied suicidal/homicidal ideations and auditory/visual hallucinations, follow-up appointments encouraged to attend, outside support groups encouraged and information given, Rx and 14 day supply of medications given.  [name] is mentally and physically stable for discharge to [facility] to continue his rehab.  Consults:  None  Significant Diagnostic Studies:  labs: completed, reviewed, stable  Discharge Vitals:   Blood pressure 117/79, pulse 82, temperature 97.4 F (36.3 C), temperature source Oral, resp. rate 18, height 6' (1.829 m), weight 81.647 kg (180 lb), SpO2 100.00%. Body mass index is 24.41 kg/(m^2). Lab Results:   No results found for this or any previous visit (from the past 72 hour(s)).  Physical Findings: AIMS: Facial and Oral Movements Muscles of Facial Expression: None, normal Lips and Perioral Area: None, normal Jaw: None, normal Tongue: None, normal,Extremity Movements Upper (arms, wrists, hands, fingers): None, normal Lower (legs, knees, ankles, toes): None, normal, Trunk Movements Neck, shoulders, hips: None, normal, Overall Severity Severity of abnormal movements (highest score from questions above): None, normal Incapacitation due to abnormal movements: None, normal Patient's awareness of abnormal movements (rate only patient's report): No Awareness, Dental Status Current problems with teeth and/or dentures?: No Does patient usually wear dentures?: No  CIWA:  CIWA-Ar Total: 2 COWS:     Psychiatric Specialty Exam: See Psychiatric Specialty Exam  and Suicide Risk Assessment completed by Attending Physician prior to discharge.  Discharge destination:  Home  Is patient on multiple antipsychotic therapies at discharge:  No   Has Patient had three or more failed trials of antipsychotic monotherapy by history:  No  Recommended Plan for Multiple Antipsychotic Therapies: NA  Discharge Orders   Future Orders Complete By Expires   Activity as tolerated - No restrictions  As directed    Diet - low sodium heart healthy  As directed        Medication List       Indication   aspirin-acetaminophen-caffeine 250-250-65 MG per tablet  Commonly known as:  EXCEDRIN MIGRAINE  Take 2 tablets by mouth every 6 (six) hours as needed for headache.      gabapentin 300 MG capsule  Commonly known as:  NEURONTIN  Take 1 capsule (300 mg total) by mouth 3 (three) times daily.   Indication:  Alcohol Withdrawal Syndrome, Neuropathic Pain     mirtazapine 15 MG tablet  Commonly known as:  REMERON  Take 1 tablet (15 mg total) by mouth at bedtime as needed (insomnia).   Indication:  Trouble Sleeping, Major Depressive Disorder     ramelteon 8 MG tablet  Commonly known as:  ROZEREM  Take 1 tablet (8 mg total) by mouth at bedtime.   Indication:  Trouble Sleeping     thiamine 100 MG tablet  Take 1 tablet (100 mg total) by mouth daily.   Indication:  Deficiency in Thiamine or Vitamin B1           Follow-up Information   Follow up with [facility]. (Arrive by 8:00AM for screening. Make sure to bring 14 day medication supply, ID, and clothing. If screening goes well and there is bed available, you will be admitted on this day. )    Contact information:   [address] [place], [place] [address] Phone: [phone] Fax: [phone]      Follow up with [facility]. (Walk in between 8am-9am Monday through Friday for hospital follow-up/medication management. )    Contact information:   [address], [place] [address] Phone:  [phone] Fax: [phone]      Follow-up recommendations:  Activity:  as tolerated Diet:  low-sdoium heart healthy diet  Comments:  Patient will continue his care at [facility]. Continue to work your relapse prevention plan/go to AA Total Discharge Time:  Greater than 30 minutes.  SignedNanine Means, PMH-NP 06/17/2013, 11:54 AM Agree with assessment and plan Reymundo Poll. Dub Mikes, M.D.

## 2013-06-17 NOTE — Progress Notes (Signed)
Pt has slept all shift but awoke at 0115 asking for any meds he missed as well as a snack. Both given along with gatorade. Suppport offered. He denies any problems or complaints and states, "I feel so much better than earlier today. I don't know why I felt so poorly, so weak but I feel better now." Pt returned to bed. Denies any SI/HI/AVH and remains safe. Lawrence Marseilles

## 2013-06-17 NOTE — [facility] Suicide Risk Assessment (Signed)
Suicide Risk Assessment  Discharge Assessment     Demographic Factors:  Male, Adolescent or young adult, Low socioeconomic status and Unemployed  Mental Status Per Nursing Assessment::   On Admission:     Current Mental Status by Physician: Mental Status Examination: Patient appeared as per his stated age, casually dressed, and fairly groomed, and maintaining good eye contact. Patient has good mood and his affect was constricted. He has normal rate, rhythm, and volume of speech. His thought process is linear and goal directed. Patient has denied suicidal, homicidal ideations, intentions or plans. Patient has no evidence of auditory or visual hallucinations, delusions, and paranoia. Patient has fair insight judgment and impulse control.  Loss Factors: Financial problems/change in socioeconomic status  Historical Factors: Prior suicide attempts, Family history of mental illness or substance abuse and Impulsivity  Risk Reduction Factors:   Sense of responsibility to family, Religious beliefs about death, Positive therapeutic relationship and Positive coping skills or problem solving skills  Continued Clinical Symptoms:  Depression:   Comorbid alcohol abuse/dependence Recent sense of peace/wellbeing Alcohol/Substance Abuse/Dependencies Unstable or Poor Therapeutic Relationship Previous Psychiatric Diagnoses and Treatments  Cognitive Features That Contribute To Risk:  Polarized thinking    Suicide Risk:  Minimal: No identifiable suicidal ideation.  Patients presenting with no risk factors but with morbid ruminations; may be classified as minimal risk based on the severity of the depressive symptoms  Discharge Diagnoses:   AXIS I:  Substance Induced Mood Disorder and Alcohol dependence and cocaine abuse AXIS II:  Deferred AXIS III:   Past Medical History  Diagnosis Date  . Back pain    AXIS IV:  other psychosocial or environmental problems, problems related to social environment  and problems with primary support group AXIS V:  61-70 mild symptoms  Plan Of Care/Follow-up recommendations:  Activity:  As tolerated Diet:  Regular  Is patient on multiple antipsychotic therapies at discharge:  No   Has Patient had three or more failed trials of antipsychotic monotherapy by history:  No  Recommended Plan for Multiple Antipsychotic Therapies: NA  Nehemiah Settle., M.D. 06/17/2013, 5:47 PM

## 2013-06-17 NOTE — Progress Notes (Signed)
Pt observed resting in bed much of the evening. This Clinical research associate did speak with him 1:1. Only complaint is residual "shakiness" as well as a headache of a 9/10. Tyelnol given prn along with scheduled rozerem. Reassured pt that shakiness should resolve over time. Let patient know staff would awake him in the morning in plenty of time for d/c. Pt appreciative of care received at [facility]. Will reassess effectiveness of tylenol at 1 hour mark. Denies SI/HI/AVH and remains safe. Lawrence Marseilles

## 2013-06-18 NOTE — Progress Notes (Signed)
Pt up and dressed, belongings packed. Discharge teaching completed, 14 day supply of meds with Rx's given, belongings returned, bus pass and directions to bus stop explained. Map with itemized steps given to pt for pick up by [facility] staff. Pt verbalizes understanding. He denies SI/HI/AVH. VSS. Pt discharged in stable condition.  Lawrence Marseilles

## 2013-06-21 NOTE — Progress Notes (Signed)
Patient Discharge Instructions:  After Visit Summary (AVS):   Faxed to:  06/21/13 Discharge Summary Note:   Faxed to:  06/21/13 Psychiatric Admission Assessment Note:   Faxed to:  06/21/13 Suicide Risk Assessment - Discharge Assessment:   Faxed to:  06/21/13 Faxed/Sent to the Next Level Care provider:  06/21/13 Faxed to [facility] @ [phone] Faxed to [facility] @ [phone]  Jerelene Redden, 06/21/2013, 3:03 PM

## 2013-06-22 ENCOUNTER — Encounter (HOSPITAL_BASED_OUTPATIENT_CLINIC_OR_DEPARTMENT_OTHER): Payer: Self-pay | Admitting: Emergency Medicine

## 2013-06-22 ENCOUNTER — Emergency Department (HOSPITAL_BASED_OUTPATIENT_CLINIC_OR_DEPARTMENT_OTHER): Payer: Medicaid Other

## 2013-06-22 ENCOUNTER — Emergency Department (HOSPITAL_BASED_OUTPATIENT_CLINIC_OR_DEPARTMENT_OTHER)
Admission: EM | Admit: 2013-06-22 | Discharge: 2013-06-22 | Disposition: A | Discharge status: Home / Self Care | Payer: Medicaid Other | Attending: Emergency Medicine | Admitting: Emergency Medicine

## 2013-06-22 DIAGNOSIS — F329 Major depressive disorder, single episode, unspecified: Secondary | ICD-10-CM | POA: Insufficient documentation

## 2013-06-22 DIAGNOSIS — Z79899 Other long term (current) drug therapy: Secondary | ICD-10-CM | POA: Insufficient documentation

## 2013-06-22 DIAGNOSIS — J069 Acute upper respiratory infection, unspecified: Secondary | ICD-10-CM | POA: Insufficient documentation

## 2013-06-22 DIAGNOSIS — Z8739 Personal history of other diseases of the musculoskeletal system and connective tissue: Secondary | ICD-10-CM | POA: Insufficient documentation

## 2013-06-22 DIAGNOSIS — F3289 Other specified depressive episodes: Secondary | ICD-10-CM | POA: Insufficient documentation

## 2013-06-22 DIAGNOSIS — R51 Headache: Secondary | ICD-10-CM | POA: Insufficient documentation

## 2013-06-22 LAB — CBC WITH DIFFERENTIAL/PLATELET
Basophils Absolute: 0 10*3/uL (ref 0.0–0.1)
Eosinophils Absolute: 0.5 10*3/uL (ref 0.0–0.7)
Eosinophils Relative: 10 % — ABNORMAL HIGH (ref 0–5)
HCT: 42.6 % (ref 39.0–52.0)
Hemoglobin: 14.2 g/dL (ref 13.0–17.0)
Lymphocytes Relative: 48 % — ABNORMAL HIGH (ref 12–46)
MCH: 31.7 pg (ref 26.0–34.0)
MCHC: 33.3 g/dL (ref 30.0–36.0)
MCV: 95.1 fL (ref 78.0–100.0)
Monocytes Absolute: 0.5 10*3/uL (ref 0.1–1.0)
Monocytes Relative: 10 % (ref 3–12)
Platelets: 183 10*3/uL (ref 150–400)
RDW: 12.9 % (ref 11.5–15.5)
WBC: 4.7 10*3/uL (ref 4.0–10.5)

## 2013-06-22 LAB — COMPREHENSIVE METABOLIC PANEL
ALT: 125 U/L — ABNORMAL HIGH (ref 0–53)
AST: 95 U/L — ABNORMAL HIGH (ref 0–37)
Albumin: 3.3 g/dL — ABNORMAL LOW (ref 3.5–5.2)
Alkaline Phosphatase: 61 U/L (ref 39–117)
Chloride: 105 mEq/L (ref 96–112)
Potassium: 4 mEq/L (ref 3.5–5.1)
Sodium: 143 mEq/L (ref 135–145)
Total Bilirubin: 0.3 mg/dL (ref 0.3–1.2)
Total Protein: 7.6 g/dL (ref 6.0–8.3)

## 2013-06-22 LAB — URINALYSIS, ROUTINE W REFLEX MICROSCOPIC
Ketones, ur: NEGATIVE mg/dL
Nitrite: NEGATIVE
Protein, ur: NEGATIVE mg/dL
pH: 6.5 (ref 5.0–8.0)

## 2013-06-22 MED ORDER — KETOROLAC TROMETHAMINE 60 MG/2ML IM SOLN
60.0000 mg | Freq: Once | INTRAMUSCULAR | Status: AC
  Administered 2013-06-22: 60 mg via INTRAMUSCULAR
  Filled 2013-06-22: qty 2

## 2013-06-22 MED ORDER — IBUPROFEN 800 MG PO TABS: 800.0000 mg | ORAL_TABLET | Freq: Three times a day (TID) | ORAL | Status: DC | PRN

## 2013-06-22 MED ORDER — METOCLOPRAMIDE HCL 5 MG/ML IJ SOLN
5.0000 mg | Freq: Once | INTRAMUSCULAR | Status: AC
  Administered 2013-06-22: 5 mg via INTRAVENOUS
  Filled 2013-06-22: qty 2

## 2013-06-22 MED ORDER — SODIUM CHLORIDE 0.9 % IV BOLUS (SEPSIS): 1000.0000 mL | Freq: Once | INTRAVENOUS | Status: DC

## 2013-06-22 NOTE — ED Notes (Addendum)
Pt amb to room 6 with quick steady gait in nad. Pt states that he is in rehab at [facility], and has had cough and congestion since arriving there a few days ago.

## 2013-06-22 NOTE — ED Provider Notes (Signed)
CSN: [id number]     Arrival date & time 06/22/13  4098 History   First MD Initiated Contact with Patient 06/22/13 0802     Chief Complaint  Patient presents with  . Cough  . Nasal Congestion  . Headache   (Consider location/radiation/quality/duration/timing/severity/associated sxs/prior Treatment) Patient is a 62 y.o. male presenting with headaches. The history is provided by the patient.  Headache Pain location:  Occipital (right sided) Quality:  Dull and stabbing Radiates to:  Does not radiate Severity currently:  9/10 Severity at highest:  10/10 Onset quality:  Gradual Duration:  2 days Timing:  Intermittent Progression:  Waxing and waning Chronicity:  Chronic Similar to prior headaches: yes   Context comment:  Assocaited with recent URI symptoms Relieved by:  None tried Worsened by:  Nothing tried Ineffective treatments:  None tried Associated symptoms: congestion, cough, dizziness, loss of balance and URI   Associated symptoms: no abdominal pain, no back pain, no blurred vision, no diarrhea, no fever, no focal weakness, no myalgias, no nausea, no near-syncope, no neck pain, no neck stiffness, no numbness, no paresthesias, no photophobia, no seizures, no sinus pressure, no sore throat, no swollen glands, no syncope, no tingling, no visual change, no vomiting and no weakness   Associated symptoms comment:  Urinary incontinence Congestion:    Location:  Nasal   Interferes with sleep: no     Interferes with eating/drinking: no   Cough:    Cough characteristics:  Non-productive   Sputum characteristics:  Nondescript   Severity:  Mild   Onset quality:  Gradual   Duration:  3 days   Timing:  Intermittent   Progression:  Waxing and waning   Chronicity:  New  Of note, the patient recently started a number of new medications including remeron, and rozerem.  Furthermore, the patient reports different history to various providers. The history is inconsistent.  Past Medical  History  Diagnosis Date  . Back pain    Past Surgical History  Procedure Laterality Date  . Appendectomy    . Lung surgery    . Neck surgery     History reviewed. No pertinent family history. History  Substance Use Topics  . Smoking status: Never Smoker   . Smokeless tobacco: Not on file  . Alcohol Use: 21.6 oz/week    36 Cans of beer per week    Review of Systems  Constitutional: Negative for fever.  HENT: Positive for congestion. Negative for sinus pressure and sore throat.   Eyes: Negative for blurred vision and photophobia.  Respiratory: Positive for cough.   Cardiovascular: Negative for syncope and near-syncope.  Gastrointestinal: Negative for nausea, vomiting, abdominal pain and diarrhea.  Musculoskeletal: Negative for back pain, myalgias, neck pain and neck stiffness.  Neurological: Positive for dizziness, headaches and loss of balance. Negative for focal weakness, seizures, numbness and paresthesias.    Allergies  Review of patient's allergies indicates no known allergies.  Home Medications   Current Outpatient Rx  Name  Route  Sig  Dispense  Refill  . aspirin-acetaminophen-caffeine (EXCEDRIN MIGRAINE) 250-250-65 MG per tablet   Oral   Take 2 tablets by mouth every 6 (six) hours as needed for headache.   30 tablet   0   . gabapentin (NEURONTIN) 300 MG capsule   Oral   Take 1 capsule (300 mg total) by mouth 3 (three) times daily.   90 capsule   0   . mirtazapine (REMERON) 15 MG tablet   Oral   Take  1 tablet (15 mg total) by mouth at bedtime as needed (insomnia).   30 tablet   0   . ramelteon (ROZEREM) 8 MG tablet   Oral   Take 1 tablet (8 mg total) by mouth at bedtime.   30 tablet   0   . thiamine 100 MG tablet   Oral   Take 1 tablet (100 mg total) by mouth daily.   30 tablet   0    BP 132/92  Pulse 85  Temp(Src) 98.5 F (36.9 C) (Oral)  Resp 18  SpO2 99% Physical Exam  Constitutional: He is oriented to person, place, and time. He  appears well-developed and well-nourished. No distress.  HENT:  Head: Normocephalic and atraumatic.  Mouth/Throat: Oropharynx is clear and moist. No oropharyngeal exudate.  Right occipital area of head tender to palpation, which reproduces headache.  Eyes: EOM are normal. Pupils are equal, round, and reactive to light.  Neck: Normal range of motion. Neck supple.  Cardiovascular: Normal rate, regular rhythm, normal heart sounds and intact distal pulses.  Exam reveals no gallop and no friction rub.   No murmur heard. Pulmonary/Chest: Effort normal. No respiratory distress. He has no wheezes. He has no rales.  Neurological: He is alert and oriented to person, place, and time. No cranial nerve deficit.  Skin: He is not diaphoretic.  Psychiatric: He has a normal mood and affect. His behavior is normal.    ED Course  Procedures (including critical care time) Labs Review Labs Reviewed  COMPREHENSIVE METABOLIC PANEL - Abnormal; Notable for the following:    Albumin 3.3 (*)    AST 95 (*)    ALT 125 (*)    GFR calc non Af Amer 71 (*)    GFR calc Af Amer 82 (*)    All other components within normal limits  CBC WITH DIFFERENTIAL - Abnormal; Notable for the following:    Neutrophils Relative % 33 (*)    Neutro Abs 1.5 (*)    Lymphocytes Relative 48 (*)    Eosinophils Relative 10 (*)    All other components within normal limits  URINALYSIS, ROUTINE W REFLEX MICROSCOPIC - Abnormal; Notable for the following:    Bilirubin Urine LARGE (*)    All other components within normal limits   Imaging Review No results found.  EKG Interpretation   None       MDM   1. URI (upper respiratory infection)     1. URI complicated by headache  The patients headache is likely a tension headache resulting from mild URI. Lungs are clear to auscultation. The headache is the typical headache that the patient experiences. CBC, UA, and CMP were unremarkable aside from mild AST ALT elevation likely due to  alcoholism. I recommend conservative symptomatic management with OTC medicines.  2. Dizziness and Drowsiness   The patients symptoms are likely due to medication side effect. He has recently started mirtazapine and rozerem.  Rozerem Side Effects:  Central nervous system:  Dizziness (4% to 5%), somnolence (3% to 5%), fatigue (3% to 4%), insomnia worsened (3%), depression (2%) Endocrine & metabolic: Serum cortisol decreased (1%) Gastrointestinal: Nausea (3%), taste perversion (2%) Neuromuscular & skeletal: Myalgia (2%), arthralgia (2%) Respiratory: Upper respiratory infection (3%) Miscellaneous: Influenza (1%)  Mirtazapine Side Effects: Central nervous system: Drowsiness (54%) Dizziness (7%) Respiratory: Flu-like symptoms (5%)  Thus, the risks and benefits must be considered. As the pateint is currently a patient at [facility], I will differ to their judgment about this.  Pleas Koch, MD 06/22/13 [phone]

## 2013-06-22 NOTE — ED Provider Notes (Signed)
I saw and evaluated the patient, reviewed the resident's note and I agree with the findings and plan.  EKG Interpretation   None       Patient is a 62 year old male with a history of substance abuse, depression who was recently admitted to behavior health service for detox from alcohol and crack cocaine and suicidal ideation he was recently discharged and is now living at the [facility] rehabilitation facility who presents the emergency department today with multiple complaints. Patient recently was started on Remeron and Rozerem.  He states since starting his medications he has felt "shaky", lightheaded and has had episodes of nocturnal enuresis. He was told by his psychiatrist that the symptoms were normal and would improve over time. He is also complaining of right-sided, throbbing headache that started yesterday. Denies that it was a thunderclap headache. Denies any numbness, tingling or focal weakness. States she's had similar headaches in the past. He has been taking Excedrin Migraine which has not been helping. Denies any head injury. No fever, neck pain or neck stiffness. This is also associated with nasal congestion and dry cough. No known sick contacts.  On exam, patient is well-appearing, hemodynamically stable. Lungs are clear to auscultation bilaterally. Heart sounds normal. Abdomen soft and nontender. Cranial nerves II through XII intact, sensation to light touch intact diffusely, strength 5/5 in all 4 extremities, no drift, no dysmetria to finger-nose testing, patient is able to ambulate with normal gait.  Suspect symptoms are secondary to viral illness, tension headache and medication effect. Have discussed with patient that he will need to followup with his psychiatrist for possibly adjusting his medications. Will check basic blood work to ensure that anemia, electrolyte abnormality, infection is not playing a role in his symptoms. We'll give Toradol and Reglan and reassess. Anticipate  discharge back to [facility] facility.  9:35 AM  Labs unremarkable other than very slight elevation of AST and ALT which is likely due to alcohol abuse. No anemia or electrolyte abnormality. Urine shows no sign of infection. Pt reports feeling better.  Will discharge back to rehabilitation facility. Given return precautions.  Layla Maw Alexia Dinger, DO 06/22/13 [phone]

## 2013-07-20 ENCOUNTER — Emergency Department (HOSPITAL_BASED_OUTPATIENT_CLINIC_OR_DEPARTMENT_OTHER)
Admission: EM | Admit: 2013-07-20 | Discharge: 2013-07-20 | Disposition: A | Discharge status: Rehabilitation Facility | Payer: Medicaid Other | Attending: Emergency Medicine | Admitting: Emergency Medicine

## 2013-07-20 ENCOUNTER — Encounter (HOSPITAL_BASED_OUTPATIENT_CLINIC_OR_DEPARTMENT_OTHER): Payer: Self-pay | Admitting: Emergency Medicine

## 2013-07-20 DIAGNOSIS — Z8739 Personal history of other diseases of the musculoskeletal system and connective tissue: Secondary | ICD-10-CM | POA: Insufficient documentation

## 2013-07-20 DIAGNOSIS — Z9889 Other specified postprocedural states: Secondary | ICD-10-CM | POA: Insufficient documentation

## 2013-07-20 DIAGNOSIS — R209 Unspecified disturbances of skin sensation: Secondary | ICD-10-CM | POA: Insufficient documentation

## 2013-07-20 DIAGNOSIS — F1010 Alcohol abuse, uncomplicated: Secondary | ICD-10-CM | POA: Insufficient documentation

## 2013-07-20 DIAGNOSIS — Z79899 Other long term (current) drug therapy: Secondary | ICD-10-CM | POA: Insufficient documentation

## 2013-07-20 DIAGNOSIS — R202 Paresthesia of skin: Secondary | ICD-10-CM

## 2013-07-20 DIAGNOSIS — F1410 Cocaine abuse, uncomplicated: Secondary | ICD-10-CM | POA: Insufficient documentation

## 2013-07-20 HISTORY — DX: Alcohol abuse, uncomplicated: F10.10

## 2013-07-20 LAB — BASIC METABOLIC PANEL
Calcium: 9.5 mg/dL (ref 8.4–10.5)
Chloride: 102 mEq/L (ref 96–112)
Creatinine, Ser: 1.2 mg/dL (ref 0.50–1.35)
GFR calc Af Amer: 74 mL/min — ABNORMAL LOW (ref >90)

## 2013-07-20 LAB — CBC WITH DIFFERENTIAL/PLATELET
Basophils Absolute: 0 10*3/uL (ref 0.0–0.1)
Basophils Relative: 0 % (ref 0–1)
Eosinophils Relative: 8 % — ABNORMAL HIGH (ref 0–5)
HCT: 39.3 % (ref 39.0–52.0)
Hemoglobin: 13.2 g/dL (ref 13.0–17.0)
MCH: 31.7 pg (ref 26.0–34.0)
MCHC: 33.6 g/dL (ref 30.0–36.0)
Monocytes Absolute: 0.6 10*3/uL (ref 0.1–1.0)
Neutro Abs: 1.5 10*3/uL — ABNORMAL LOW (ref 1.7–7.7)
Platelets: 187 10*3/uL (ref 150–400)
RDW: 12.2 % (ref 11.5–15.5)

## 2013-07-20 LAB — TROPONIN I: Troponin I: <0.3 ng/mL (ref <0.30)

## 2013-07-20 NOTE — ED Notes (Signed)
Pt. Is from [facility].  Pt. Is being treated alcohol, cocaine, and narcotic abuse.  Pt. Reports being at [facility] for 33 days.  Pt. Is in no distress and speaks clear concise speech.

## 2013-07-20 NOTE — ED Notes (Signed)
From [facility] drug detox facility with chest pain and tingling in his arms x 2 hours.

## 2013-07-20 NOTE — ED Provider Notes (Signed)
CSN: [id number]     Arrival date & time 07/20/13  1325 History   First MD Initiated Contact with Patient 07/20/13 1333     Chief Complaint  Patient presents with  . Chest Pain   (Consider location/radiation/quality/duration/timing/severity/associated sxs/prior Treatment) HPI Comments: The patient comes to the ER for evaluation of  "electric shocks" going down his left arm for 3 days. He has not noticed any alleviating or exacerbating factors. There is no accompanying chest pain or any shortness of breath. Patient denies injury.  Patient says that the pain has been waxing and waning, but has not gone away. It feels like he stuck his hand in an outlet.   Patient is a 62 y.o. male presenting with chest pain.  Chest Pain Associated symptoms: no shortness of breath and no weakness     Past Medical History  Diagnosis Date  . Back pain   . Cocaine abuse   . Alcohol abuse    Past Surgical History  Procedure Laterality Date  . Appendectomy    . Lung surgery    . Neck surgery     No family history on file. History  Substance Use Topics  . Smoking status: Never Smoker   . Smokeless tobacco: Not on file  . Alcohol Use: 21.6 oz/week    36 Cans of beer per week    Review of Systems  Respiratory: Negative for shortness of breath.   Cardiovascular: Positive for chest pain.  Neurological: Negative for tremors and weakness.  All other systems reviewed and are negative.    Allergies  Review of patient's allergies indicates no known allergies.  Home Medications   Current Outpatient Rx  Name  Route  Sig  Dispense  Refill  . etodolac (LODINE) 400 MG tablet   Oral   Take 400 mg by mouth 2 (two) times daily.         Marland Kitchen aspirin-acetaminophen-caffeine (EXCEDRIN MIGRAINE) 250-250-65 MG per tablet   Oral   Take 2 tablets by mouth every 6 (six) hours as needed for headache.   30 tablet   0   . gabapentin (NEURONTIN) 300 MG capsule   Oral   Take 1 capsule (300 mg total) by mouth  3 (three) times daily.   90 capsule   0   . ibuprofen (ADVIL,MOTRIN) 800 MG tablet   Oral   Take 1 tablet (800 mg total) by mouth every 8 (eight) hours as needed.   30 tablet   0   . mirtazapine (REMERON) 15 MG tablet   Oral   Take 1 tablet (15 mg total) by mouth at bedtime as needed (insomnia).   30 tablet   0   . ramelteon (ROZEREM) 8 MG tablet   Oral   Take 1 tablet (8 mg total) by mouth at bedtime.   30 tablet   0   . thiamine 100 MG tablet   Oral   Take 1 tablet (100 mg total) by mouth daily.   30 tablet   0    BP 118/70  Pulse 95  Temp(Src) 97.7 F (36.5 C) (Oral)  Resp 20  Ht 6' (1.829 m)  Wt 180 lb (81.647 kg)  BMI 24.41 kg/m2  SpO2 100% Physical Exam  Constitutional: He is oriented to person, place, and time. He appears well-developed and well-nourished. No distress.  HENT:  Head: Normocephalic and atraumatic.  Right Ear: Hearing normal.  Left Ear: Hearing normal.  Nose: Nose normal.  Mouth/Throat: Oropharynx is clear and moist  and mucous membranes are normal.  Eyes: Conjunctivae and EOM are normal. Pupils are equal, round, and reactive to light.  Neck: Normal range of motion. Neck supple.  Cardiovascular: Regular rhythm, S1 normal and S2 normal.  Exam reveals no gallop and no friction rub.   No murmur heard. Pulmonary/Chest: Effort normal and breath sounds normal. No respiratory distress. He exhibits no tenderness.  Abdominal: Soft. Normal appearance and bowel sounds are normal. There is no hepatosplenomegaly. There is no tenderness. There is no rebound, no guarding, no tenderness at McBurney's point and negative Murphy's sign. No hernia.  Musculoskeletal: Normal range of motion.  Neurological: He is alert and oriented to person, place, and time. He has normal strength. No cranial nerve deficit or sensory deficit. Coordination normal. GCS eye subscore is 4. GCS verbal subscore is 5. GCS motor subscore is 6.  Skin: Skin is warm, dry and intact. No rash  noted. No cyanosis.  Psychiatric: He has a normal mood and affect. His speech is normal and behavior is normal. Thought content normal.    ED Course  Procedures (including critical care time) Labs Review Labs Reviewed - No data to display Imaging Review No results found.  EKG Interpretation    Date/Time:  Friday July 20 2013 13:31:29 EST Ventricular Rate:  91 PR Interval:  142 QRS Duration: 80 QT Interval:  352 QTC Calculation: 432 R Axis:   76 Text Interpretation:  Normal sinus rhythm Normal ECG Confirmed by POLLINA  MD, CHRISTOPHER ([id number]) on 07/20/2013 1:33:43 PM            MDM  Diagnosis: Paresthesia  She presents to the ER with atypical pains shooting down his left arm for the last 3 days. He does not have any chest pain or shortness of breath. Does have a history of cocaine abuse, but no recent use. Pain is reproducible when he moves his arm. Symptoms are consistent with a paresthesia secondary to possible nerve impingement at the shoulder, no concern for acute coronary syndrome. After 3 days of continuous symptoms, patient's EKG and enzymes are negative. Will return to [facility], NSAIDs as needed.    Gilda Crease, MD 07/20/13 [phone]

## 2013-09-11 ENCOUNTER — Encounter (HOSPITAL_COMMUNITY): Payer: Self-pay | Admitting: Pharmacy Technician

## 2013-09-11 ENCOUNTER — Other Ambulatory Visit: Payer: Self-pay | Admitting: Physician Assistant

## 2013-09-11 NOTE — H&P (Signed)
TOTAL KNEE ADMISSION H&P  Patient is being admitted for left total knee arthroplasty.  Subjective:  Chief Complaint:left knee pain.  HPI: [name], 62 y.o. male, has a history of pain and functional disability in the left knee due to arthritis and has failed non-surgical conservative treatments for greater than 12 weeks to includeNSAID's and/or analgesics, corticosteriod injections and activity modification, knee arthroscopy.  Onset of symptoms was gradual, starting >10 years ago with gradually worsening course since that time. The patient noted prior procedures on the knee to include  arthroscopy and menisectomy on the left knee(s).  Patient currently rates pain in the left knee(s) at 10 out of 10 with activity. Patient has night pain, worsening of pain with activity and weight bearing, pain that interferes with activities of daily living, pain with passive range of motion, crepitus and joint swelling.  Patient has evidence of periarticular osteophytes and joint space narrowing by imaging studies.There is no active infection.  Patient Active Problem List   Diagnosis Date Noted  . Alcohol dependence 06/12/2013  . Cocaine dependence 06/12/2013  . Substance induced mood disorder 06/12/2013   Past Medical History  Diagnosis Date  . Back pain   . Cocaine abuse   . Alcohol abuse     Past Surgical History  Procedure Laterality Date  . Appendectomy    . Lung surgery    . Neck surgery       (Not in a hospital admission) No Known Allergies  History  Substance Use Topics  . Smoking status: Never Smoker   . Smokeless tobacco: Not on file  . Alcohol Use: 21.6 oz/week    36 Cans of beer per week    No family history on file.   Review of Systems  Constitutional: Negative.   HENT: Negative.   Eyes: Negative.   Respiratory: Negative.   Cardiovascular: Negative.   Gastrointestinal: Negative.   Genitourinary: Negative.   Musculoskeletal: Positive for back pain and joint pain.  Negative for falls.  Skin: Negative.   Neurological: Negative.   Endo/Heme/Allergies: Negative.   Psychiatric/Behavioral: Positive for substance abuse. Negative for depression, suicidal ideas, hallucinations and memory loss. The patient is not nervous/anxious and does not have insomnia.     Objective:  Physical Exam  Constitutional: He is oriented to person, place, and time. He appears well-developed and well-nourished. No distress.  HENT:  Head: Normocephalic and atraumatic.  Nose: Nose normal.  Eyes: Conjunctivae and EOM are normal. Pupils are equal, round, and reactive to light. No scleral icterus.  Neck: Normal range of motion. Neck supple. No JVD present.  Cardiovascular: Normal rate, regular rhythm, normal heart sounds and intact distal pulses.   No murmur heard. Respiratory: Effort normal and breath sounds normal. No respiratory distress. He has no wheezes. He has no rales. He exhibits no tenderness.  GI: Soft. Bowel sounds are normal. He exhibits no distension. There is no tenderness.  Musculoskeletal:       Left knee: He exhibits decreased range of motion, swelling and bony tenderness. He exhibits no ecchymosis, no deformity, no LCL laxity and no MCL laxity. Tenderness found. Medial joint line tenderness noted.  Lymphadenopathy:    He has no cervical adenopathy.  Neurological: He is alert and oriented to person, place, and time. No cranial nerve deficit.  Skin: Skin is warm and dry. No rash noted. No erythema.  Psychiatric: He has a normal mood and affect. His behavior is normal.    Vital signs in last 24 hours: @VSRANGES@    Labs:   Estimated body mass index is 24.41 kg/(m^2) as calculated from the following:   Height as of 07/20/13: 6' (1.829 m).   Weight as of 07/20/13: 81.647 kg (180 lb).   Imaging Review Plain radiographs demonstrate severe degenerative joint disease of the left knee(s). The overall alignment ismild varus. The bone quality appears to be good for  age and reported activity level.  Assessment/Plan:  End stage arthritis, left knee   The patient history, physical examination, clinical judgment of the provider and imaging studies are consistent with end stage degenerative joint disease of the left knee(s) and total knee arthroplasty is deemed medically necessary. The treatment options including medical management, injection therapy arthroscopy and arthroplasty were discussed at length. The risks and benefits of total knee arthroplasty were presented and reviewed. The risks due to aseptic loosening, infection, stiffness, patella tracking problems, thromboembolic complications and other imponderables were discussed. The patient acknowledged the explanation, agreed to proceed with the plan and consent was signed. Patient is being admitted for inpatient treatment for surgery, pain control, PT, OT, prophylactic antibiotics, VTE prophylaxis, progressive ambulation and ADL's and discharge planning. The patient is planning to be discharged to skilled nursing facility due to current living situation residing in half-way house requiring SNF placement.

## 2013-09-12 ENCOUNTER — Other Ambulatory Visit (HOSPITAL_COMMUNITY): Payer: Self-pay | Admitting: *Deleted

## 2013-09-12 NOTE — Pre-Procedure Instructions (Signed)
[name]  09/12/2013   Your procedure is scheduled on:  Friday, September 21, 2013 at 7:30 AM.   Report to  Hospital Entrance "A" Admitting Office at 5:30 AM.   Call this number if you have problems the morning of surgery: [phone]   Remember:   Do not eat food or drink liquids after midnight Thursday, 09/19/13.   Take these medicines the morning of surgery with A SIP OF WATER: HYDROcodone-acetaminophen (NORCO/VICODIN) or acetaminophen (TYLENOL) - if needed.  Stop Ibuprofen as of this Friday, 09/14/13. Don't take any NSAIDS prior to surgery.     Do not wear jewelry.  Do not wear lotions, powders, or cologne. You may wear deodorant.  Men may shave face and neck.  Do not bring valuables to the hospital.  [name] is not responsible                  for any belongings or valuables.               Contacts, dentures or bridgework may not be worn into surgery.  Leave suitcase in the car. After surgery it may be brought to your room.  For patients admitted to the hospital, discharge time is determined by your                treatment team.    Special Instructions:  - Preparing for Surgery  Before surgery, you can play an important role.  Because skin is not sterile, your skin needs to be as free of germs as possible.  You can reduce the number of germs on you skin by washing with CHG (chlorahexidine gluconate) soap before surgery.  CHG is an antiseptic cleaner which kills germs and bonds with the skin to continue killing germs even after washing.  Please DO NOT use if you have an allergy to CHG or antibacterial soaps.  If your skin becomes reddened/irritated stop using the CHG and inform your nurse when you arrive at Short Stay.  Do not shave (including legs and underarms) for at least 48 hours prior to the first CHG shower.  You may shave your face.  Please follow these instructions carefully:   1.  Shower with CHG Soap the night before surgery and the                                 morning of Surgery.  2.  If you choose to wash your hair, wash your hair first as usual with your       normal shampoo.  3.  After you shampoo, rinse your hair and body thoroughly to remove the                      Shampoo.  4.  Use CHG as you would any other liquid soap.  You can apply chg directly       to the skin and wash gently with scrungie or a clean washcloth.  5.  Apply the CHG Soap to your body ONLY FROM THE NECK DOWN.        Do not use on open wounds or open sores.  Avoid contact with your eyes, ears, mouth and genitals (private parts).  Wash genitals (private parts) with your normal soap.  6.  Wash thoroughly, paying special attention to the area where your surgery        will be performed.    7.  Thoroughly rinse your body with warm water from the neck down.  8.  DO NOT shower/wash with your normal soap after using and rinsing off       the CHG Soap.  9.  Pat yourself dry with a clean towel.            10.  Wear clean pajamas.            11.  Place clean sheets on your bed the night of your first shower and do not        sleep with pets.  Day of Surgery  Do not apply any lotions/deoderants the morning of surgery.  Please wear clean clothes to the hospital/surgery center.     Please read over the following fact sheets that you were given: Pain Booklet, Coughing and Deep Breathing, Blood Transfusion Information, MRSA Information and Surgical Site Infection Prevention   

## 2013-09-12 NOTE — Pre-Procedure Instructions (Signed)
Watson Robarge  09/12/2013   Your procedure is scheduled on:  Friday, September 21, 2013 at 7:30 AM.   Report to [facility] Entrance "A" Admitting Office at 5:30 AM.   Call this number if you have problems the morning of surgery: [phone]   Remember:   Do not eat food or drink liquids after midnight Thursday, 09/19/13.   Take these medicines the morning of surgery with A SIP OF WATER: HYDROcodone-acetaminophen (NORCO/VICODIN) or acetaminophen (TYLENOL) - if needed.  Stop Ibuprofen as of this Friday, 09/14/13. Don't take any NSAIDS prior to surgery.     Do not wear jewelry.  Do not wear lotions, powders, or cologne. You may wear deodorant.  Men may shave face and neck.  Do not bring valuables to the hospital.  [facility] is not responsible                  for any belongings or valuables.               Contacts, dentures or bridgework may not be worn into surgery.  Leave suitcase in the car. After surgery it may be brought to your room.  For patients admitted to the hospital, discharge time is determined by your                treatment team.    Special Instructions: [place] - Preparing for Surgery  Before surgery, you can play an important role.  Because skin is not sterile, your skin needs to be as free of germs as possible.  You can reduce the number of germs on you skin by washing with CHG (chlorahexidine gluconate) soap before surgery.  CHG is an antiseptic cleaner which kills germs and bonds with the skin to continue killing germs even after washing.  Please DO NOT use if you have an allergy to CHG or antibacterial soaps.  If your skin becomes reddened/irritated stop using the CHG and inform your nurse when you arrive at Short Stay.  Do not shave (including legs and underarms) for at least 48 hours prior to the first CHG shower.  You may shave your face.  Please follow these instructions carefully:   1.  Shower with CHG Soap the night before surgery and the                                 morning of Surgery.  2.  If you choose to wash your hair, wash your hair first as usual with your       normal shampoo.  3.  After you shampoo, rinse your hair and body thoroughly to remove the                      Shampoo.  4.  Use CHG as you would any other liquid soap.  You can apply chg directly       to the skin and wash gently with scrungie or a clean washcloth.  5.  Apply the CHG Soap to your body ONLY FROM THE NECK DOWN.        Do not use on open wounds or open sores.  Avoid contact with your eyes, ears, mouth and genitals (private parts).  Wash genitals (private parts) with your normal soap.  6.  Wash thoroughly, paying special attention to the area where your surgery        will be performed.  7.  Thoroughly rinse your body with warm water from the neck down.  8.  DO NOT shower/wash with your normal soap after using and rinsing off       the CHG Soap.  9.  Pat yourself dry with a clean towel.            10.  Wear clean pajamas.            11.  Place clean sheets on your bed the night of your first shower and do not        sleep with pets.  Day of Surgery  Do not apply any lotions/deoderants the morning of surgery.  Please wear clean clothes to the hospital/surgery center.     Please read over the following fact sheets that you were given: Pain Booklet, Coughing and Deep Breathing, Blood Transfusion Information, MRSA Information and Surgical Site Infection Prevention

## 2013-09-13 ENCOUNTER — Encounter (HOSPITAL_COMMUNITY)
Admission: RE | Admit: 2013-09-13 | Discharge: 2013-09-13 | Disposition: A | Discharge status: Home / Self Care | Payer: Medicaid Other | Source: Ambulatory Visit | Attending: Orthopedic Surgery | Admitting: Orthopedic Surgery

## 2013-09-13 ENCOUNTER — Encounter (HOSPITAL_COMMUNITY): Payer: Self-pay

## 2013-09-13 DIAGNOSIS — Z01818 Encounter for other preprocedural examination: Secondary | ICD-10-CM | POA: Insufficient documentation

## 2013-09-13 DIAGNOSIS — Z01812 Encounter for preprocedural laboratory examination: Secondary | ICD-10-CM | POA: Insufficient documentation

## 2013-09-13 HISTORY — DX: Unspecified osteoarthritis, unspecified site: M19.90

## 2013-09-13 LAB — URINALYSIS, ROUTINE W REFLEX MICROSCOPIC
Bilirubin Urine: NEGATIVE
Glucose, UA: NEGATIVE mg/dL
Hgb urine dipstick: NEGATIVE
Ketones, ur: NEGATIVE mg/dL
Leukocytes, UA: NEGATIVE
Nitrite: NEGATIVE
PROTEIN: NEGATIVE mg/dL
SPECIFIC GRAVITY, URINE: 1.023 (ref 1.005–1.030)
UROBILINOGEN UA: 0.2 mg/dL (ref 0.0–1.0)
pH: 6 (ref 5.0–8.0)

## 2013-09-13 LAB — COMPREHENSIVE METABOLIC PANEL
ALK PHOS: 59 U/L (ref 39–117)
ALT: 77 U/L — ABNORMAL HIGH (ref 0–53)
AST: 72 U/L — AB (ref 0–37)
Albumin: 4.2 g/dL (ref 3.5–5.2)
BUN: 17 mg/dL (ref 6–23)
CALCIUM: 10 mg/dL (ref 8.4–10.5)
CO2: 24 meq/L (ref 19–32)
Chloride: 100 mEq/L (ref 96–112)
Creatinine, Ser: 0.98 mg/dL (ref 0.50–1.35)
GFR calc Af Amer: >90 mL/min (ref >90)
GFR calc non Af Amer: 88 mL/min — ABNORMAL LOW (ref >90)
Glucose, Bld: 96 mg/dL (ref 70–99)
POTASSIUM: 4.9 meq/L (ref 3.7–5.3)
SODIUM: 138 meq/L (ref 137–147)
Total Bilirubin: 0.3 mg/dL (ref 0.3–1.2)
Total Protein: 8.5 g/dL — ABNORMAL HIGH (ref 6.0–8.3)

## 2013-09-13 LAB — TYPE AND SCREEN
ABO/RH(D): A POS
ANTIBODY SCREEN: NEGATIVE

## 2013-09-13 LAB — CBC WITH DIFFERENTIAL/PLATELET
BASOS ABS: 0 10*3/uL (ref 0.0–0.1)
Basophils Relative: 0 % (ref 0–1)
EOS PCT: 5 % (ref 0–5)
Eosinophils Absolute: 0.3 10*3/uL (ref 0.0–0.7)
HCT: 42.8 % (ref 39.0–52.0)
Hemoglobin: 15.1 g/dL (ref 13.0–17.0)
LYMPHS ABS: 2.8 10*3/uL (ref 0.7–4.0)
Lymphocytes Relative: 47 % — ABNORMAL HIGH (ref 12–46)
MCH: 32.7 pg (ref 26.0–34.0)
MCHC: 35.3 g/dL (ref 30.0–36.0)
MCV: 92.6 fL (ref 78.0–100.0)
Monocytes Absolute: 0.4 10*3/uL (ref 0.1–1.0)
Monocytes Relative: 7 % (ref 3–12)
NEUTROS ABS: 2.4 10*3/uL (ref 1.7–7.7)
NEUTROS PCT: 40 % — AB (ref 43–77)
PLATELETS: 263 10*3/uL (ref 150–400)
RBC: 4.62 MIL/uL (ref 4.22–5.81)
RDW: 12.3 % (ref 11.5–15.5)
WBC: 5.9 10*3/uL (ref 4.0–10.5)

## 2013-09-13 LAB — APTT: APTT: 32 s (ref 24–37)

## 2013-09-13 LAB — SURGICAL PCR SCREEN
MRSA, PCR: NEGATIVE
STAPHYLOCOCCUS AUREUS: NEGATIVE

## 2013-09-13 LAB — PROTIME-INR
INR: 1.08 (ref 0.00–1.49)
Prothrombin Time: 13.8 seconds (ref 11.6–15.2)

## 2013-09-13 LAB — ABO/RH: ABO/RH(D): A POS

## 2013-09-13 MED ORDER — CHLORHEXIDINE GLUCONATE 4 % EX LIQD: 60.0000 mL | Freq: Once | CUTANEOUS | Status: DC

## 2013-09-14 LAB — URINE CULTURE
Colony Count: NO GROWTH
Culture: NO GROWTH

## 2013-09-17 ENCOUNTER — Encounter (HOSPITAL_COMMUNITY): Payer: Self-pay

## 2013-09-17 NOTE — Progress Notes (Addendum)
Anesthesia Chart Review:  Patient is a 63 year old male scheduled for left TKA on 09/21/13 by Dr. French Ana.  History includes non-smoker, hepatitis C, arthritis, chronic back pain, appendectomy, prior lung and neck surgeries. He has been sober of cocaine and ETOH use for one year. PCP is with [facility].  He was seen by Wendie Simmer, FNP-C on 08/17/13 for preoperative medical clearance.    EKG on 07/20/13 showed NSR. CXR on 04/13/13 showed no active cardiopulmonary disease.  Preoperative labs noted.  AST/ALT elevated at 72/77, slightly up from 50/64 on 08/17/13 (known hepatitis C). PLT count and PT/PTT WNL.  The surgeon apparently ordered a serum drug screen.  Results are still pending.  Short Stay RN to follow-up and let me know if positive.  If drug screen results acceptable and otherwise no acute changes then I would anticipate that he could proceed as planned. (Update: RN notified Dr. French Ana that there was insufficient sample to run the serum drug panel.)  George Hugh [facility] Short Stay Center/Anesthesiology Phone [phone] 09/17/2013 1:35 PM

## 2013-09-18 NOTE — Progress Notes (Signed)
Serum drug panel canceled per [facility] due to insufficient sample.  Dr French Ana notified and he stated he will take care of it and probably order a urine sample DOS.

## 2013-09-20 MED ORDER — TRANEXAMIC ACID 100 MG/ML IV SOLN
1000.0000 mg | INTRAVENOUS | Status: AC
  Administered 2013-09-21: 1000 mg via INTRAVENOUS
  Filled 2013-09-20: qty 10

## 2013-09-20 MED ORDER — CEFAZOLIN SODIUM-DEXTROSE 2-3 GM-% IV SOLR
2.0000 g | INTRAVENOUS | Status: AC
  Administered 2013-09-21: 2 g via INTRAVENOUS
  Filled 2013-09-20: qty 50

## 2013-09-21 ENCOUNTER — Inpatient Hospital Stay (HOSPITAL_COMMUNITY): Payer: Medicaid Other | Admitting: Anesthesiology

## 2013-09-21 ENCOUNTER — Encounter (HOSPITAL_COMMUNITY): Payer: Self-pay | Admitting: *Deleted

## 2013-09-21 ENCOUNTER — Encounter (HOSPITAL_COMMUNITY): Payer: Medicaid Other | Admitting: Vascular Surgery

## 2013-09-21 ENCOUNTER — Encounter (HOSPITAL_COMMUNITY)
Admission: RE | Disposition: A | Discharge status: Skilled Nursing Facility | Payer: Self-pay | Source: Ambulatory Visit | Attending: Orthopedic Surgery

## 2013-09-21 ENCOUNTER — Inpatient Hospital Stay (HOSPITAL_COMMUNITY)
Admission: RE | Admit: 2013-09-21 | Discharge: 2013-09-25 | DRG: 470 | Disposition: A | Discharge status: Skilled Nursing Facility | Payer: Medicaid Other | Source: Ambulatory Visit | Attending: Orthopedic Surgery | Admitting: Orthopedic Surgery

## 2013-09-21 DIAGNOSIS — M1710 Unilateral primary osteoarthritis, unspecified knee: Principal | ICD-10-CM | POA: Diagnosis present

## 2013-09-21 DIAGNOSIS — M549 Dorsalgia, unspecified: Secondary | ICD-10-CM | POA: Diagnosis present

## 2013-09-21 DIAGNOSIS — M1712 Unilateral primary osteoarthritis, left knee: Secondary | ICD-10-CM | POA: Diagnosis present

## 2013-09-21 DIAGNOSIS — D62 Acute posthemorrhagic anemia: Secondary | ICD-10-CM | POA: Diagnosis not present

## 2013-09-21 DIAGNOSIS — B1920 Unspecified viral hepatitis C without hepatic coma: Secondary | ICD-10-CM | POA: Diagnosis present

## 2013-09-21 DIAGNOSIS — G4700 Insomnia, unspecified: Secondary | ICD-10-CM | POA: Diagnosis present

## 2013-09-21 DIAGNOSIS — F1010 Alcohol abuse, uncomplicated: Secondary | ICD-10-CM | POA: Diagnosis present

## 2013-09-21 DIAGNOSIS — F1410 Cocaine abuse, uncomplicated: Secondary | ICD-10-CM | POA: Diagnosis present

## 2013-09-21 HISTORY — PX: TOTAL KNEE ARTHROPLASTY: SHX125

## 2013-09-21 LAB — CBC
HEMATOCRIT: 39.9 % (ref 39.0–52.0)
Hemoglobin: 13.9 g/dL (ref 13.0–17.0)
MCH: 32.6 pg (ref 26.0–34.0)
MCHC: 34.8 g/dL (ref 30.0–36.0)
MCV: 93.4 fL (ref 78.0–100.0)
Platelets: 257 10*3/uL (ref 150–400)
RBC: 4.27 MIL/uL (ref 4.22–5.81)
RDW: 12.4 % (ref 11.5–15.5)
WBC: 8.8 10*3/uL (ref 4.0–10.5)

## 2013-09-21 LAB — CREATININE, SERUM
Creatinine, Ser: 1.04 mg/dL (ref 0.50–1.35)
GFR, EST AFRICAN AMERICAN: 88 mL/min — AB (ref >90)
GFR, EST NON AFRICAN AMERICAN: 76 mL/min — AB (ref >90)

## 2013-09-21 SURGERY — ARTHROPLASTY, KNEE, TOTAL
Anesthesia: Regional | Site: Knee | Laterality: Left

## 2013-09-21 MED ORDER — HYDROMORPHONE HCL PF 1 MG/ML IJ SOLN
0.2500 mg | INTRAMUSCULAR | Status: DC | PRN
  Administered 2013-09-21 (×2): 0.5 mg via INTRAVENOUS
  Administered 2013-09-21: 1 mg via INTRAVENOUS

## 2013-09-21 MED ORDER — HYDROMORPHONE HCL PF 1 MG/ML IJ SOLN
1.0000 mg | INTRAMUSCULAR | Status: DC | PRN
  Administered 2013-09-21 – 2013-09-25 (×12): 1 mg via INTRAVENOUS
  Filled 2013-09-21 (×12): qty 1

## 2013-09-21 MED ORDER — DOCUSATE SODIUM 100 MG PO CAPS
100.0000 mg | ORAL_CAPSULE | Freq: Two times a day (BID) | ORAL | Status: DC
  Administered 2013-09-21 – 2013-09-25 (×8): 100 mg via ORAL
  Filled 2013-09-21 (×10): qty 1

## 2013-09-21 MED ORDER — PHENOL 1.4 % MT LIQD: 1.0000 | OROMUCOSAL | Status: DC | PRN

## 2013-09-21 MED ORDER — METOCLOPRAMIDE HCL 5 MG/ML IJ SOLN
5.0000 mg | Freq: Three times a day (TID) | INTRAMUSCULAR | Status: DC | PRN
  Administered 2013-09-21: 10 mg via INTRAVENOUS
  Filled 2013-09-21: qty 2

## 2013-09-21 MED ORDER — ACETAMINOPHEN 650 MG RE SUPP: 650.0000 mg | Freq: Four times a day (QID) | RECTAL | Status: DC | PRN

## 2013-09-21 MED ORDER — PROPOFOL 10 MG/ML IV BOLUS
INTRAVENOUS | Status: AC
  Filled 2013-09-21: qty 20

## 2013-09-21 MED ORDER — MIDAZOLAM HCL 5 MG/5ML IJ SOLN
INTRAMUSCULAR | Status: DC | PRN
  Administered 2013-09-21 (×2): 1 mg via INTRAVENOUS

## 2013-09-21 MED ORDER — SCOPOLAMINE 1 MG/3DAYS TD PT72
1.0000 | MEDICATED_PATCH | TRANSDERMAL | Status: DC
  Administered 2013-09-21 – 2013-09-24 (×2): 1.5 mg via TRANSDERMAL
  Filled 2013-09-21 (×2): qty 1

## 2013-09-21 MED ORDER — SODIUM CHLORIDE 0.9 % IV SOLN
INTRAVENOUS | Status: DC
  Administered 2013-09-21: via INTRAVENOUS
  Administered 2013-09-21: 75 mL/h via INTRAVENOUS

## 2013-09-21 MED ORDER — LIDOCAINE HCL (CARDIAC) 20 MG/ML IV SOLN
INTRAVENOUS | Status: DC | PRN
  Administered 2013-09-21: 75 mg via INTRAVENOUS

## 2013-09-21 MED ORDER — CEFAZOLIN SODIUM 1-5 GM-% IV SOLN
1.0000 g | Freq: Four times a day (QID) | INTRAVENOUS | Status: AC
  Administered 2013-09-21 (×2): 1 g via INTRAVENOUS
  Filled 2013-09-21 (×2): qty 50

## 2013-09-21 MED ORDER — FENTANYL CITRATE 0.05 MG/ML IJ SOLN
INTRAMUSCULAR | Status: DC | PRN
  Administered 2013-09-21 (×5): 50 ug via INTRAVENOUS

## 2013-09-21 MED ORDER — PROPOFOL 10 MG/ML IV BOLUS
INTRAVENOUS | Status: DC | PRN
  Administered 2013-09-21: 200 mg via INTRAVENOUS

## 2013-09-21 MED ORDER — PHENYLEPHRINE HCL 10 MG/ML IJ SOLN
INTRAMUSCULAR | Status: DC | PRN
  Administered 2013-09-21: 80 ug via INTRAVENOUS

## 2013-09-21 MED ORDER — MENTHOL 3 MG MT LOZG: 1.0000 | LOZENGE | OROMUCOSAL | Status: DC | PRN

## 2013-09-21 MED ORDER — ONDANSETRON HCL 4 MG/2ML IJ SOLN
4.0000 mg | Freq: Four times a day (QID) | INTRAMUSCULAR | Status: DC | PRN
  Administered 2013-09-21: 4 mg via INTRAVENOUS
  Filled 2013-09-21: qty 2

## 2013-09-21 MED ORDER — LIDOCAINE HCL (CARDIAC) 20 MG/ML IV SOLN
INTRAVENOUS | Status: AC
  Filled 2013-09-21: qty 5

## 2013-09-21 MED ORDER — OXYCODONE HCL 5 MG PO TABS
ORAL_TABLET | ORAL | Status: AC
  Administered 2013-09-22: 5 mg via ORAL
  Filled 2013-09-21: qty 3

## 2013-09-21 MED ORDER — OXYCODONE HCL 5 MG PO TABS
5.0000 mg | ORAL_TABLET | Freq: Once | ORAL | Status: AC | PRN
  Administered 2013-09-21: 5 mg via ORAL

## 2013-09-21 MED ORDER — BISACODYL 5 MG PO TBEC: 5.0000 mg | DELAYED_RELEASE_TABLET | Freq: Every day | ORAL | Status: DC | PRN

## 2013-09-21 MED ORDER — HYDROMORPHONE HCL PF 1 MG/ML IJ SOLN
INTRAMUSCULAR | Status: AC
  Filled 2013-09-21: qty 1

## 2013-09-21 MED ORDER — SENNOSIDES-DOCUSATE SODIUM 8.6-50 MG PO TABS: 1.0000 | ORAL_TABLET | Freq: Every evening | ORAL | Status: DC | PRN

## 2013-09-21 MED ORDER — ACETAMINOPHEN 325 MG PO TABS: 650.0000 mg | ORAL_TABLET | Freq: Four times a day (QID) | ORAL | Status: DC | PRN

## 2013-09-21 MED ORDER — ONDANSETRON HCL 4 MG/2ML IJ SOLN
INTRAMUSCULAR | Status: DC | PRN
  Administered 2013-09-21: 4 mg via INTRAVENOUS

## 2013-09-21 MED ORDER — METHOCARBAMOL 500 MG PO TABS
500.0000 mg | ORAL_TABLET | Freq: Four times a day (QID) | ORAL | Status: DC | PRN
  Administered 2013-09-21 – 2013-09-24 (×6): 500 mg via ORAL
  Filled 2013-09-21 (×6): qty 1

## 2013-09-21 MED ORDER — FLEET ENEMA 7-19 GM/118ML RE ENEM: 1.0000 | ENEMA | Freq: Once | RECTAL | Status: AC | PRN

## 2013-09-21 MED ORDER — OXYCODONE HCL 5 MG/5ML PO SOLN: 5.0000 mg | Freq: Once | ORAL | Status: AC | PRN

## 2013-09-21 MED ORDER — BUPIVACAINE-EPINEPHRINE PF 0.5-1:200000 % IJ SOLN
INTRAMUSCULAR | Status: DC | PRN
Start: 2013-09-21 — End: 2013-09-21
  Administered 2013-09-21: 30 mL via PERINEURAL

## 2013-09-21 MED ORDER — ONDANSETRON HCL 4 MG PO TABS
4.0000 mg | ORAL_TABLET | Freq: Four times a day (QID) | ORAL | Status: DC | PRN
  Administered 2013-09-22: 4 mg via ORAL
  Filled 2013-09-21: qty 1

## 2013-09-21 MED ORDER — METHOCARBAMOL 100 MG/ML IJ SOLN
500.0000 mg | Freq: Four times a day (QID) | INTRAMUSCULAR | Status: DC | PRN
  Filled 2013-09-21: qty 5

## 2013-09-21 MED ORDER — LACTATED RINGERS IV SOLN
INTRAVENOUS | Status: DC | PRN
  Administered 2013-09-21 (×2): via INTRAVENOUS

## 2013-09-21 MED ORDER — METHOCARBAMOL 500 MG PO TABS
ORAL_TABLET | ORAL | Status: AC
  Administered 2013-09-22: 500 mg via ORAL
  Filled 2013-09-21: qty 1

## 2013-09-21 MED ORDER — OXYCODONE HCL 5 MG PO TABS
5.0000 mg | ORAL_TABLET | ORAL | Status: DC | PRN
  Administered 2013-09-21 (×4): 10 mg via ORAL
  Administered 2013-09-22: 5 mg via ORAL
  Administered 2013-09-22 – 2013-09-25 (×14): 10 mg via ORAL
  Filled 2013-09-21 (×18): qty 2

## 2013-09-21 MED ORDER — CHLORHEXIDINE GLUCONATE 4 % EX LIQD: 60.0000 mL | Freq: Once | CUTANEOUS | Status: DC

## 2013-09-21 MED ORDER — ONDANSETRON HCL 4 MG/2ML IJ SOLN
INTRAMUSCULAR | Status: AC
  Filled 2013-09-21: qty 2

## 2013-09-21 MED ORDER — CHLORHEXIDINE GLUCONATE 4 % EX LIQD: 1.0000 "application " | Freq: Once | CUTANEOUS | Status: DC

## 2013-09-21 MED ORDER — SODIUM CHLORIDE 0.9 % IV SOLN: INTRAVENOUS | Status: DC

## 2013-09-21 MED ORDER — ONDANSETRON HCL 4 MG/2ML IJ SOLN: 4.0000 mg | Freq: Four times a day (QID) | INTRAMUSCULAR | Status: DC | PRN

## 2013-09-21 MED ORDER — FENTANYL CITRATE 0.05 MG/ML IJ SOLN
INTRAMUSCULAR | Status: AC
  Filled 2013-09-21: qty 5

## 2013-09-21 MED ORDER — BUPIVACAINE-EPINEPHRINE (PF) 0.5% -1:200000 IJ SOLN
INTRAMUSCULAR | Status: AC
  Filled 2013-09-21: qty 10

## 2013-09-21 MED ORDER — METOCLOPRAMIDE HCL 10 MG PO TABS: 5.0000 mg | ORAL_TABLET | Freq: Three times a day (TID) | ORAL | Status: DC | PRN

## 2013-09-21 MED ORDER — SODIUM CHLORIDE 0.9 % IR SOLN
Status: DC | PRN
  Administered 2013-09-21: 1000 mL

## 2013-09-21 MED ORDER — PHENYLEPHRINE 40 MCG/ML (10ML) SYRINGE FOR IV PUSH (FOR BLOOD PRESSURE SUPPORT)
PREFILLED_SYRINGE | INTRAVENOUS | Status: AC
  Filled 2013-09-21: qty 10

## 2013-09-21 MED ORDER — MIDAZOLAM HCL 2 MG/2ML IJ SOLN
INTRAMUSCULAR | Status: AC
  Filled 2013-09-21: qty 2

## 2013-09-21 MED ORDER — DEXTROSE 5 % IV SOLN
INTRAVENOUS | Status: DC | PRN
  Administered 2013-09-21: 08:00:00 via INTRAVENOUS

## 2013-09-21 MED ORDER — ENOXAPARIN SODIUM 30 MG/0.3ML ~~LOC~~ SOLN
30.0000 mg | Freq: Two times a day (BID) | SUBCUTANEOUS | Status: DC
  Administered 2013-09-22 – 2013-09-25 (×7): 30 mg via SUBCUTANEOUS
  Filled 2013-09-21 (×9): qty 0.3

## 2013-09-21 SURGICAL SUPPLY — 67 items
BANDAGE ELASTIC 4 VELCRO ST LF (GAUZE/BANDAGES/DRESSINGS) ×3 IMPLANT
BANDAGE ELASTIC 6 VELCRO ST LF (GAUZE/BANDAGES/DRESSINGS) ×3 IMPLANT
BANDAGE ESMARK 6X9 LF (GAUZE/BANDAGES/DRESSINGS) ×1 IMPLANT
BLADE SAGITTAL 25.0X1.19X90 (BLADE) ×2 IMPLANT
BLADE SAGITTAL 25.0X1.19X90MM (BLADE) ×1
BLADE SAW SAG 90X13X1.27 (BLADE) ×3 IMPLANT
BNDG CMPR 9X6 STRL LF SNTH (GAUZE/BANDAGES/DRESSINGS) ×1
BNDG ESMARK 6X9 LF (GAUZE/BANDAGES/DRESSINGS) ×3
BONE CEMENT GENTAMICIN (Cement) ×6 IMPLANT
BOWL SMART MIX CTS (DISPOSABLE) ×3 IMPLANT
CAPT RP KNEE ×3 IMPLANT
CEMENT BONE GENTAMICIN 40 (Cement) ×2 IMPLANT
CLOTH BEACON ORANGE TIMEOUT ST (SAFETY) ×3 IMPLANT
COVER SURGICAL LIGHT HANDLE (MISCELLANEOUS) ×3 IMPLANT
CUFF TOURNIQUET SINGLE 34IN LL (TOURNIQUET CUFF) ×3 IMPLANT
CUFF TOURNIQUET SINGLE 44IN (TOURNIQUET CUFF) IMPLANT
DRAPE INCISE IOBAN 66X45 STRL (DRAPES) ×3 IMPLANT
DRAPE ORTHO SPLIT 77X108 STRL (DRAPES) ×6
DRAPE SURG ORHT 6 SPLT 77X108 (DRAPES) ×3 IMPLANT
DRAPE U-SHAPE 47X51 STRL (DRAPES) ×3 IMPLANT
DRSG ADAPTIC 3X8 NADH LF (GAUZE/BANDAGES/DRESSINGS) ×3 IMPLANT
DRSG PAD ABDOMINAL 8X10 ST (GAUZE/BANDAGES/DRESSINGS) ×6 IMPLANT
DURAPREP 26ML APPLICATOR (WOUND CARE) ×3 IMPLANT
ELECT REM PT RETURN 9FT ADLT (ELECTROSURGICAL) ×3
ELECTRODE REM PT RTRN 9FT ADLT (ELECTROSURGICAL) ×1 IMPLANT
EVACUATOR 1/8 PVC DRAIN (DRAIN) ×3 IMPLANT
FACESHIELD LNG OPTICON STERILE (SAFETY) ×6 IMPLANT
FLOSEAL 10ML (HEMOSTASIS) IMPLANT
GLOVE BIOGEL PI IND STRL 8 (GLOVE) ×4 IMPLANT
GLOVE BIOGEL PI INDICATOR 8 (GLOVE) ×8
GLOVE ORTHO TXT STRL SZ7.5 (GLOVE) ×12 IMPLANT
GLOVE SURG ORTHO 8.0 STRL STRW (GLOVE) ×9 IMPLANT
GLOVE SURG SS PI 7.0 STRL IVOR (GLOVE) ×3 IMPLANT
GOWN PREVENTION PLUS XLARGE (GOWN DISPOSABLE) ×3 IMPLANT
GOWN STRL NON-REIN LRG LVL3 (GOWN DISPOSABLE) ×9 IMPLANT
GOWN STRL REUS W/TWL 2XL LVL3 (GOWN DISPOSABLE) ×3 IMPLANT
HANDPIECE INTERPULSE COAX TIP (DISPOSABLE) ×2
HOOD PEEL AWAY FACE SHEILD DIS (HOOD) ×12 IMPLANT
IMMOBILIZER KNEE 22 UNIV (SOFTGOODS) IMPLANT
KIT BASIN OR (CUSTOM PROCEDURE TRAY) ×3 IMPLANT
KIT ROOM TURNOVER OR (KITS) ×3 IMPLANT
MANIFOLD NEPTUNE II (INSTRUMENTS) ×3 IMPLANT
NEEDLE 22X1 1/2 (OR ONLY) (NEEDLE) IMPLANT
NS IRRIG 1000ML POUR BTL (IV SOLUTION) ×3 IMPLANT
PACK TOTAL JOINT (CUSTOM PROCEDURE TRAY) ×3 IMPLANT
PAD ABD 8X10 STRL (GAUZE/BANDAGES/DRESSINGS) ×6 IMPLANT
PAD ARMBOARD 7.5X6 YLW CONV (MISCELLANEOUS) ×6 IMPLANT
PAD CAST 4YDX4 CTTN HI CHSV (CAST SUPPLIES) ×1 IMPLANT
PADDING CAST COTTON 4X4 STRL (CAST SUPPLIES) ×3
PADDING CAST COTTON 6X4 STRL (CAST SUPPLIES) ×3 IMPLANT
PENCIL BUTTON HOLSTER BLD 10FT (ELECTRODE) ×3 IMPLANT
SET HNDPC FAN SPRY TIP SCT (DISPOSABLE) ×1 IMPLANT
SPONGE GAUZE 4X4 12PLY (GAUZE/BANDAGES/DRESSINGS) ×3 IMPLANT
STAPLER VISISTAT 35W (STAPLE) ×3 IMPLANT
SUCTION FRAZIER TIP 10 FR DISP (SUCTIONS) ×3 IMPLANT
SUT ETHIBOND NAB CT1 #1 30IN (SUTURE) ×9 IMPLANT
SUT VIC AB 0 CT1 27 (SUTURE) ×2
SUT VIC AB 0 CT1 27XBRD ANBCTR (SUTURE) ×1 IMPLANT
SUT VIC AB 1 CT1 27 (SUTURE) ×4
SUT VIC AB 1 CT1 27XBRD ANBCTR (SUTURE) ×2 IMPLANT
SUT VIC AB 2-0 CT1 27 (SUTURE) ×4
SUT VIC AB 2-0 CT1 TAPERPNT 27 (SUTURE) ×2 IMPLANT
SYR CONTROL 10ML LL (SYRINGE) IMPLANT
TOWEL OR 17X24 6PK STRL BLUE (TOWEL DISPOSABLE) ×3 IMPLANT
TOWEL OR 17X26 10 PK STRL BLUE (TOWEL DISPOSABLE) ×3 IMPLANT
TRAY FOLEY CATH 16FRSI W/METER (SET/KITS/TRAYS/PACK) IMPLANT
WATER STERILE IRR 1000ML POUR (IV SOLUTION) ×9 IMPLANT

## 2013-09-21 NOTE — Progress Notes (Signed)
Utilization review completed.  

## 2013-09-21 NOTE — Evaluation (Signed)
Physical Therapy Evaluation Patient Details Name: [name] MRN: [id number] DOB: 1950/10/23 Today's Date: 09/21/2013 Time: 9381-8299 PT Time Calculation (min): 25 min  PT Assessment / Plan / Recommendation History of Present Illness  Pt is a 63 y/o male admitted s/p L TKA.  Clinical Impression  This patient presents with acute pain and decreased functional independence following the above mentioned procedure. At the time of PT eval, pt was able to transition from bed to recliner with min assist to power-up to full stand as well as to pivot around to the chair. Pt has planned for SNF at d/c prior to surgery. This patient is appropriate for skilled PT interventions to address functional limitations, improve safety and independence with functional mobility, and return to PLOF.     PT Assessment  Patient needs continued PT services    Follow Up Recommendations  SNF    Does the patient have the potential to tolerate intense rehabilitation      Barriers to Discharge        Equipment Recommendations  Other (comment) (TBD by next venue of care)    Recommendations for Other Services     Frequency 7X/week    Precautions / Restrictions Precautions Precautions: Fall;Knee Precaution Comments: Discussed towel roll under heel and NO pillow under knee Required Braces or Orthoses: Knee Immobilizer - Left Knee Immobilizer - Left: On when out of bed or walking Restrictions Weight Bearing Restrictions: Yes LLE Weight Bearing: Weight bearing as tolerated   Pertinent Vitals/Pain Pt reports 9/10 pain at rest. Pain medication was provided at beginning of session.       Mobility  Bed Mobility Overal bed mobility: Needs Assistance Bed Mobility: Supine to Sit Supine to sit: Min assist General bed mobility comments: VC's for sequencing and technique.  Transfers Overall transfer level: Needs assistance Equipment used: Rolling walker (2 wheeled) Transfers: Sit to/from [employer] Sit to Stand: Min assist Stand pivot transfers: Min assist General transfer comment: VC's for hand placement on seated surface for safety. Assist to power-up to full stand, as well as for occasional walker placement during SPT to recliner.     Exercises Total Joint Exercises Ankle Circles/Pumps: 10 reps Quad Sets: 10 reps   PT Diagnosis: Difficulty walking;Acute pain  PT Problem List: Decreased strength;Decreased range of motion;Decreased activity tolerance;Decreased balance;Decreased mobility;Decreased knowledge of use of DME;Decreased safety awareness;Decreased knowledge of precautions;Pain PT Treatment Interventions: DME instruction;Gait training;Functional mobility training;Stair training;Therapeutic activities;Therapeutic exercise;Neuromuscular re-education;Patient/family education     PT Goals(Current goals can be found in the care plan section) Acute Rehab PT Goals Patient Stated Goal: To return home after rehab PT Goal Formulation: With patient Time For Goal Achievement: 10/05/13 Potential to Achieve Goals: Good  Visit Information  Last PT Received On: 09/21/13 Assistance Needed: +1 History of Present Illness: Pt is a 63 y/o male admitted s/p L TKA.       Prior [place] expects to be discharged to:: Skilled nursing facility Living Arrangements: Children Additional Comments: Daughter in 20's - unclear how often she is home Prior Function Level of Independence: Independent Communication Communication: No difficulties Dominant Hand: Right    Cognition  Cognition Arousal/Alertness: Awake/alert Behavior During Therapy: WFL for tasks assessed/performed Overall Cognitive Status: Within Functional Limits for tasks assessed    Extremity/Trunk Assessment Upper Extremity Assessment Upper Extremity Assessment: Overall WFL for tasks assessed Lower Extremity Assessment Lower Extremity Assessment: LLE deficits/detail LLE Deficits /  Details: Decreased strength and AROM consistent with TKA Cervical /  Trunk Assessment Cervical / Trunk Assessment: Normal   Balance Balance Overall balance assessment: Needs assistance Sitting-balance support: Feet supported;Bilateral upper extremity supported Sitting balance-Leahy Scale: Fair Standing balance support: Bilateral upper extremity supported Standing balance-Leahy Scale: Poor  End of Session PT - End of Session Equipment Utilized During Treatment: Gait belt;Left knee immobilizer Activity Tolerance: Patient tolerated treatment well Patient left: in chair;with call bell/phone within reach Nurse Communication: Mobility status CPM Left Knee CPM Left Knee: Off  GP     Jolyn Lent 09/21/2013, 3:17 PM  Jolyn Lent, [place], DPT [phone]

## 2013-09-21 NOTE — H&P (View-Only) (Signed)
TOTAL KNEE ADMISSION H&P  Patient is being admitted for left total knee arthroplasty.  Subjective:  Chief Complaint:left knee pain.  HPI: [name], 63 y.o. male, has a history of pain and functional disability in the left knee due to arthritis and has failed non-surgical conservative treatments for greater than 12 weeks to includeNSAID's and/or analgesics, corticosteriod injections and activity modification, knee arthroscopy.  Onset of symptoms was gradual, starting >10 years ago with gradually worsening course since that time. The patient noted prior procedures on the knee to include  arthroscopy and menisectomy on the left knee(s).  Patient currently rates pain in the left knee(s) at 10 out of 10 with activity. Patient has night pain, worsening of pain with activity and weight bearing, pain that interferes with activities of daily living, pain with passive range of motion, crepitus and joint swelling.  Patient has evidence of periarticular osteophytes and joint space narrowing by imaging studies.There is no active infection.  Patient Active Problem List   Diagnosis Date Noted  . Alcohol dependence 06/12/2013  . Cocaine dependence 06/12/2013  . Substance induced mood disorder 06/12/2013   Past Medical History  Diagnosis Date  . Back pain   . Cocaine abuse   . Alcohol abuse     Past Surgical History  Procedure Laterality Date  . Appendectomy    . Lung surgery    . Neck surgery       (Not in a hospital admission) No Known Allergies  History  Substance Use Topics  . Smoking status: Never Smoker   . Smokeless tobacco: Not on file  . Alcohol Use: 21.6 oz/week    36 Cans of beer per week    No family history on file.   Review of Systems  Constitutional: Negative.   HENT: Negative.   Eyes: Negative.   Respiratory: Negative.   Cardiovascular: Negative.   Gastrointestinal: Negative.   Genitourinary: Negative.   Musculoskeletal: Positive for back pain and joint pain.  Negative for falls.  Skin: Negative.   Neurological: Negative.   Endo/Heme/Allergies: Negative.   Psychiatric/Behavioral: Positive for substance abuse. Negative for depression, suicidal ideas, hallucinations and memory loss. The patient is not nervous/anxious and does not have insomnia.     Objective:  Physical Exam  Constitutional: He is oriented to person, place, and time. He appears well-developed and well-nourished. No distress.  HENT:  Head: Normocephalic and atraumatic.  Nose: Nose normal.  Eyes: Conjunctivae and EOM are normal. Pupils are equal, round, and reactive to light. No scleral icterus.  Neck: Normal range of motion. Neck supple. No JVD present.  Cardiovascular: Normal rate, regular rhythm, normal heart sounds and intact distal pulses.   No murmur heard. Respiratory: Effort normal and breath sounds normal. No respiratory distress. He has no wheezes. He has no rales. He exhibits no tenderness.  GI: Soft. Bowel sounds are normal. He exhibits no distension. There is no tenderness.  Musculoskeletal:       Left knee: He exhibits decreased range of motion, swelling and bony tenderness. He exhibits no ecchymosis, no deformity, no LCL laxity and no MCL laxity. Tenderness found. Medial joint line tenderness noted.  Lymphadenopathy:    He has no cervical adenopathy.  Neurological: He is alert and oriented to person, place, and time. No cranial nerve deficit.  Skin: Skin is warm and dry. No rash noted. No erythema.  Psychiatric: He has a normal mood and affect. His behavior is normal.    Vital signs in last 24 hours: @VSRANGES @  Labs:   Estimated body mass index is 24.41 kg/(m^2) as calculated from the following:   Height as of 07/20/13: 6' (1.829 m).   Weight as of 07/20/13: 81.647 kg (180 lb).   Imaging Review Plain radiographs demonstrate severe degenerative joint disease of the left knee(s). The overall alignment ismild varus. The bone quality appears to be good for  age and reported activity level.  Assessment/Plan:  End stage arthritis, left knee   The patient history, physical examination, clinical judgment of the provider and imaging studies are consistent with end stage degenerative joint disease of the left knee(s) and total knee arthroplasty is deemed medically necessary. The treatment options including medical management, injection therapy arthroscopy and arthroplasty were discussed at length. The risks and benefits of total knee arthroplasty were presented and reviewed. The risks due to aseptic loosening, infection, stiffness, patella tracking problems, thromboembolic complications and other imponderables were discussed. The patient acknowledged the explanation, agreed to proceed with the plan and consent was signed. Patient is being admitted for inpatient treatment for surgery, pain control, PT, OT, prophylactic antibiotics, VTE prophylaxis, progressive ambulation and ADL's and discharge planning. The patient is planning to be discharged to skilled nursing facility due to current living situation residing in half-way house requiring SNF placement.

## 2013-09-21 NOTE — Anesthesia Preprocedure Evaluation (Addendum)
Anesthesia Evaluation  Patient identified by MRN, date of birth, ID band Patient awake    Reviewed: Allergy & Precautions, H&P , NPO status , Patient's Chart, lab work & pertinent test results, reviewed documented beta blocker date and time   Airway Mallampati: I  Neck ROM: Full  Mouth opening: Limited Mouth Opening  Dental  (+) Partial Lower, Partial Upper, Dental Advisory Given   Pulmonary neg pulmonary ROS,          Cardiovascular negative cardio ROS      Neuro/Psych    GI/Hepatic (+)     substance abuse  alcohol use, Hepatitis -, C  Endo/Other    Renal/GU      Musculoskeletal  (+) Arthritis -,   Abdominal   Peds  Hematology   Anesthesia Other Findings   Reproductive/Obstetrics                          Anesthesia Physical Anesthesia Plan  ASA: II  Anesthesia Plan: General and Regional   Post-op Pain Management: MAC Combined w/ Regional for Post-op pain   Induction: Intravenous  Airway Management Planned: LMA  Additional Equipment:   Intra-op Plan:   Post-operative Plan:   Informed Consent: I have reviewed the patients History and Physical, chart, labs and discussed the procedure including the risks, benefits and alternatives for the proposed anesthesia with the patient or authorized representative who has indicated his/her understanding and acceptance.     Plan Discussed with: CRNA, Anesthesiologist and Surgeon  Anesthesia Plan Comments:         Anesthesia Quick Evaluation

## 2013-09-21 NOTE — Progress Notes (Signed)
Orthopedic Tech Progress Note Patient Details:  [name] June 07, 1951 [id number] CPM applied to Left LE with appropriate settings. OHF applied to bed. CPM Left Knee CPM Left Knee: On Left Knee Flexion (Degrees): 60 Left Knee Extension (Degrees): 0   Asia R Thompson 09/21/2013, 10:21 AM

## 2013-09-21 NOTE — Transfer of Care (Signed)
Immediate Anesthesia Transfer of Care Note  Patient: [name]  Procedure(s) Performed: Procedure(s): LEFT TOTAL KNEE ARTHROPLASTY (Left)  Patient Location: PACU  Anesthesia Type:General  Level of Consciousness: awake, alert  and patient cooperative  Airway & Oxygen Therapy: Patient Spontanous Breathing and Patient connected to nasal cannula oxygen  Post-op Assessment: Report given to PACU RN, Post -op Vital signs reviewed and stable and Patient moving all extremities  Post vital signs: Reviewed and stable  Complications: No apparent anesthesia complications

## 2013-09-21 NOTE — Anesthesia Postprocedure Evaluation (Signed)
Anesthesia Post Note  Patient: [name]  Procedure(s) Performed: Procedure(s) (LRB): LEFT TOTAL KNEE ARTHROPLASTY (Left)  Anesthesia type: General  Patient location: PACU  Post pain: Pain level controlled and Adequate analgesia  Post assessment: Post-op Vital signs reviewed, Patient's Cardiovascular Status Stable, Respiratory Function Stable, Patent Airway and Pain level controlled  Last Vitals:  Filed Vitals:   09/21/13 1000  BP:   Temp: 36.6 C  Resp:     Post vital signs: Reviewed and stable  Level of consciousness: awake, alert  and oriented  Complications: No apparent anesthesia complications

## 2013-09-21 NOTE — Interval H&P Note (Signed)
History and Physical Interval Note:  09/21/2013 7:25 AM  [name]  has presented today for surgery, with the diagnosis of OSTEOARTHRIITIS LEFT KNEE  The various methods of treatment have been discussed with the patient and family. After consideration of risks, benefits and other options for treatment, the patient has consented to  Procedure(s): LEFT TOTAL KNEE ARTHROPLASTY (Left) as a surgical intervention .  The patient's history has been reviewed, patient examined, no change in status, stable for surgery.  I have reviewed the patient's chart and labs.  Questions were answered to the patient's satisfaction.     Maxwell Martorano JR,W D

## 2013-09-21 NOTE — Preoperative (Signed)
Beta Blockers   Reason not to administer Beta Blockers:Not Applicable 

## 2013-09-21 NOTE — Progress Notes (Signed)
3 bags of clothing/etc sent with pt/ daughter has not checked in with volunteers

## 2013-09-21 NOTE — Clinical Social Work Note (Signed)
CSW has faxed 30 day note and FL2 to MD at [phone]. 30 day note and FL2 need to be signed in order for pt to obtain a Level 2 PASARR for SNF placement.   Pati Gallo, [place] Social Worker [phone]

## 2013-09-21 NOTE — Anesthesia Procedure Notes (Addendum)
Anesthesia Regional Block:  Femoral nerve block  Pre-Anesthetic Checklist: ,, timeout performed, Correct Patient, Correct Site, Correct Laterality, Correct Procedure,, site marked, risks and benefits discussed, Surgical consent,  Pre-op evaluation,  At surgeon's request and post-op pain management  Laterality: Left  Prep: chloraprep       Needles:  Injection technique: Single-shot  Needle Type: Echogenic Stimulator Needle     Needle Length: 9cm 9 cm Needle Gauge: 21 and 21 G    Additional Needles:  Procedures: nerve stimulator Femoral nerve block  Nerve Stimulator or Paresthesia:  Response: Quadriceps muscle contraction, 0.45 mA,   Additional Responses:   Narrative:  Start time: 09/21/2013 7:08 AM End time: 09/21/2013 7:19 AM Injection made incrementally with aspirations every 5 mL.  Performed by: Personally  Anesthesiologist: Dr Marcie Bal  Additional Notes: Functioning IV was confirmed and monitors were applied.  A 48mm 21ga Arrow echogenic stimulator needle was used. Sterile prep and drape,hand hygiene and sterile gloves were used.  Negative aspiration and negative test dose prior to incremental administration of local anesthetic. The patient tolerated the procedure well.     Procedure Name: LMA Insertion Date/Time: 09/21/2013 7:43 AM Performed by: Terrill Mohr Pre-anesthesia Checklist: Patient identified, Emergency Drugs available, Suction available and Patient being monitored Patient Re-evaluated:Patient Re-evaluated prior to inductionOxygen Delivery Method: Circle system utilized Preoxygenation: Pre-oxygenation with 100% oxygen Intubation Type: IV induction Ventilation: Mask ventilation without difficulty LMA: LMA inserted LMA Size: 5.0 Number of attempts: 1 Placement Confirmation: breath sounds checked- equal and bilateral and positive ETCO2 ETT to lip (cm): taped across cheeks. Tube secured with: Tape Dental Injury: Teeth and Oropharynx as per  pre-operative assessment

## 2013-09-21 NOTE — Discharge Instructions (Signed)
Diet: As you were doing prior to hospitalization  ° °Activity:  Increase activity slowly as tolerated  °                No lifting or driving for 6 weeks ° °Shower:  May shower without a dressing starting Wed., NO SOAKING IN TUB ° °Dressing:  You may change your dressing on Monday °                   Then change the dressing daily with sterile 4"x4"s gauze dressing  °                   And TED hose for knees.  Use paper tape to hold dressing in place  °                   For hips.  You may clean the incision with alcohol prior to redressing. ° °Weight Bearing:   weight bearing as taught in physical therapy.  Use a walker or  °                  Crutches as instructed. ° °To prevent constipation: you may use a stool softener such as - °              Colace ( over the counter) 100 mg by mouth twice a day  °              Drink plenty of fluids ( prune juice may be helpful) and high fiber foods °               Miralax ( over the counter) for constipation as needed.   ° °Precautions:  If you experience chest pain or shortness of breath - call 911 immediately               For transfer to the hospital emergency department!! °              If you develop a fever greater that 101 F, purulent drainage from wound,                             increased redness or drainage from wound, or calf pain -- Call the office  ° °Follow- Up Appointment:  Please call for an appointment to be seen in 2 weeks °              [place] - [phone] ° ° ° °

## 2013-09-21 NOTE — Care Management Note (Signed)
  CARE MANAGEMENT NOTE 09/21/2013  Patient:  [name], [name]   Account Number:  [id number]  Date Initiated:  09/21/2013  Documentation initiated by:  Ricki Miller  Subjective/Objective Assessment:   62 yr old male s/p left total knee arthroplasty.     Action/Plan:   Patient will need shortterm rehab at [facility].   Anticipated DC Date:  09/24/2013   Anticipated DC Plan:  SKILLED NURSING FACILITY  In-house referral  Clinical Social Worker      DC Planning Services  CM consult      Choice offered to / List presented to:             Status of service:  Completed, signed off Medicare Important Message given?   (If response is "NO", the following Medicare IM given date fields will be blank) Date Medicare IM given:   Date Additional Medicare IM given:    Discharge Disposition:  [place]

## 2013-09-21 NOTE — Brief Op Note (Signed)
09/21/2013  9:51 AM  PATIENT:  [name]  63 y.o. male  PRE-OPERATIVE DIAGNOSIS:  OSTEOARTHRIITIS LEFT KNEE  POST-OPERATIVE DIAGNOSIS:  OSTEOARTHRIITIS LEFT KNEE  PROCEDURE:  Procedure(s): LEFT TOTAL KNEE ARTHROPLASTY (Left)  SURGEON:  Surgeon(s) and Role:    * W D Valeta Harms., MD - Primary  PHYSICIAN ASSISTANT: Chriss Czar, PA-C  ASSISTANTS:   ANESTHESIA:   regional and general  EBL:  Total I/O In: 1050 [I.V.:1050] Out: -   BLOOD ADMINISTERED:none  DRAINS: 1 hemovac drain lateral left knee self suction   LOCAL MEDICATIONS USED:  NONE  SPECIMEN:  No Specimen  DISPOSITION OF SPECIMEN:  N/A  COUNTS:  YES  TOURNIQUET:   Total Tourniquet Time Documented: Thigh (Left) - 64 minutes Total: Thigh (Left) - 64 minutes   DICTATION: .Other Dictation: Dictation Number unknown  PLAN OF CARE: Admit to inpatient   PATIENT DISPOSITION:  PACU - hemodynamically stable.   Delay start of Pharmacological VTE agent (>24hrs) due to surgical blood loss or risk of bleeding: yes

## 2013-09-22 LAB — CBC
HCT: 39.1 % (ref 39.0–52.0)
HEMOGLOBIN: 13.1 g/dL (ref 13.0–17.0)
MCH: 31.8 pg (ref 26.0–34.0)
MCHC: 33.5 g/dL (ref 30.0–36.0)
MCV: 94.9 fL (ref 78.0–100.0)
Platelets: 245 10*3/uL (ref 150–400)
RBC: 4.12 MIL/uL — ABNORMAL LOW (ref 4.22–5.81)
RDW: 12.6 % (ref 11.5–15.5)
WBC: 8.9 10*3/uL (ref 4.0–10.5)

## 2013-09-22 LAB — BASIC METABOLIC PANEL
BUN: 10 mg/dL (ref 6–23)
CO2: 28 mEq/L (ref 19–32)
Calcium: 8.9 mg/dL (ref 8.4–10.5)
Chloride: 95 mEq/L — ABNORMAL LOW (ref 96–112)
Creatinine, Ser: 1.13 mg/dL (ref 0.50–1.35)
GFR, EST AFRICAN AMERICAN: 80 mL/min — AB (ref >90)
GFR, EST NON AFRICAN AMERICAN: 69 mL/min — AB (ref >90)
Glucose, Bld: 102 mg/dL — ABNORMAL HIGH (ref 70–99)
Potassium: 4.5 mEq/L (ref 3.7–5.3)
SODIUM: 134 meq/L — AB (ref 137–147)

## 2013-09-22 NOTE — Progress Notes (Signed)
Clinical Social Work Department BRIEF PSYCHOSOCIAL ASSESSMENT 09/22/2013  Patient:  VALDIS, BEVILL     Account Number:  [id number]     Admit date:  09/21/2013  Clinical Social Worker:  Rolinda Roan  Date/Time:  09/22/2013 04:45 PM  Referred by:  Physician  Date Referred:  09/21/2013 Referred for  SNF Placement   Other Referral:   Interview type:  Patient Other interview type:    PSYCHOSOCIAL DATA Living Status:  OTHER Admitted from facility:   Level of care:   Primary support name:  Hero Kulish Primary support relationship to patient:  CHILD, ADULT Degree of support available:   Good support per patient.    CURRENT CONCERNS  Other Concerns:    SOCIAL WORK ASSESSMENT / PLAN Clinical Social Worker (CSW) met with patient to discuss D/C plans. Patient reported that he spoke with Nathaniel Man CSW's clinical supervisor who stated that we would try to find patient a place to go for rehab because he has nobody to care for him. Patient reported that he lives in a half way house and is agreeable to SNF search across several counties. CSW explained to patient that we would have to expand the SNF search because he has medicaid. Patient verbalized his understanding.   Assessment/plan status:  Psychosocial Support/Ongoing Assessment of Needs Other assessment/ plan:   Information/referral to community resources:    PATIENT'S/FAMILY'S RESPONSE TO PLAN OF CARE: Patient reported that he will go to rehab where ever he can get a bed. Patient thanked CSW for visit.

## 2013-09-22 NOTE — Progress Notes (Signed)
SPORTS MEDICINE AND JOINT REPLACEMENT  Lara Mulch, MD   Carlynn Spry, PA-C [address], [place], [place]  [address]                             [phone]   PROGRESS NOTE  Subjective:  negative for Chest Pain  negative for Shortness of Breath  negative for Nausea/Vomiting   negative for Calf Pain  negative for Bowel Movement   Tolerating Diet: yes         Patient reports pain as 7 on 0-10 scale.    Objective: Vital signs in last 24 hours:   Patient Vitals for the past 24 hrs:  BP Temp Pulse Resp SpO2 Height Weight  09/22/13 0538 113/74 mmHg 99.2 F (37.3 C) 94 16 97 % - -  09/22/13 0335 - - - 18 98 % - -  09/22/13 0200 120/74 mmHg 98 F (36.7 C) 91 16 99 % - -  09/22/13 0000 - - - 18 95 % - -  09/21/13 2020 128/70 mmHg 98.2 F (36.8 C) 91 16 100 % - -  09/21/13 2000 - - - 18 100 % - -  09/21/13 1600 - - - 16 100 % - -  09/21/13 1427 - - - - - 6' (1.829 m) 95.709 kg (211 lb)  09/21/13 1414 143/89 mmHg 97.4 F (36.3 C) 77 16 100 % - -  09/21/13 1122 - - - 16 100 % - -  09/21/13 1111 150/95 mmHg 97.6 F (36.4 C) 71 16 100 % - -  09/21/13 1045 128/85 mmHg 97.6 F (36.4 C) 68 14 100 % - -  09/21/13 1034 - - 69 13 - - -  09/21/13 1030 - - - 15 - - -  09/21/13 1015 - - 72 14 95 % - -  09/21/13 1000 110/45 mmHg 97.8 F (36.6 C) 78 18 100 % - -    @flow {1959:LAST@   Intake/Output from previous day:   02/20 0701 - 02/21 0700 In: 1870 [P.O.:120; I.V.:1750] Out: 1130 [Urine:450; Drains:530]   Intake/Output this shift:   02/21 0701 - 02/21 1900 In: -  Out: 350 [Urine:350]   Intake/Output     02/20 0701 - 02/21 0700 02/21 0701 - 02/22 0700   P.O. 120    I.V. (mL/kg) 1750 (18.3)    Total Intake(mL/kg) 1870 (19.5)    Urine (mL/kg/hr) 450 (0.2) 350 (2)   Drains 530 (0.2)    Blood 150 (0.1)    Total Output 1130 350   Net +740 -350           LABORATORY DATA:  Recent Labs  09/21/13 1405 09/22/13 0445  WBC 8.8 8.9  HGB 13.9 13.1  HCT 39.9 39.1   PLT 257 245    Recent Labs  09/21/13 1405 09/22/13 0445  NA  --  134*  K  --  4.5  CL  --  95*  CO2  --  28  BUN  --  10  CREATININE 1.04 1.13  GLUCOSE  --  102*  CALCIUM  --  8.9   Lab Results  Component Value Date   INR 1.08 09/13/2013    Examination:  General appearance: alert, cooperative and no distress Extremities: Homans sign is negative, no sign of DVT  Wound Exam: clean, dry, intact   Drainage:  None: wound tissue dry  Motor Exam: EHL and FHL Intact  Sensory Exam: Deep  Peroneal normal   Assessment:    1 Day Post-Op  Procedure(s) (LRB): LEFT TOTAL KNEE ARTHROPLASTY (Left)  ADDITIONAL DIAGNOSIS:  Active Problems:   Osteoarthritis of left knee  Acute Blood Loss Anemia   Plan: Physical Therapy as ordered Weight Bearing as Tolerated (WBAT)  DVT Prophylaxis:  Lovenox  DISCHARGE PLAN: Skilled Nursing Facility/Rehab  DISCHARGE NEEDS: HHPT, CPM, Walker and 3-in-1 comode seat         Asriel Westrup 09/22/2013, 8:52 AM

## 2013-09-22 NOTE — Progress Notes (Addendum)
Clinical Social Work Department CLINICAL SOCIAL WORK PLACEMENT NOTE 09/22/2013  Patient:  ABDULLAHI, [name]  Account Number:  [id number] [place] date:  09/21/2013  Clinical Social Worker:  Blima Rich, Latanya Presser  Date/time:  09/22/2013 04:58 PM  Clinical Social Work is seeking post-discharge placement for this patient at the following level of care:   [place]   (*CSW will update this form in Epic as items are completed)   09/22/2013  Patient/family provided with [place] Department of Clinical Social Work's list of facilities offering this level of care within the geographic area requested by the patient (or if unable, by the patient's family).  09/22/2013  Patient/family informed of their freedom to choose among providers that offer the needed level of care, that participate in Medicare, Medicaid or managed care program needed by the patient, have an available bed and are willing to accept the patient.  09/22/2013  Patient/family informed of [facility]' ownership interest in [facility], as well as of the fact that they are under no obligation to receive care at this facility.  PASARR submitted to EDS on 09/21/2013 PASARR number received from EDS on 09/24/13  FL2 transmitted to all facilities in geographic area requested by pt/family on  09/21/2013 FL2 transmitted to all facilities within larger geographic area on 09/22/2013  Patient informed that his/her managed care company has contracts with or will negotiate with  certain facilities, including the following:     Patient/family informed of bed offers received:   Patient chooses bed at [facility] [place] recommends and patient chooses bed at    Patient to be transferred to  on  09/25/2013 Patient to be transferred to facility by [facility]  The following physician request were entered in Epic:   Additional Comments: Patient's PASARR went to a manual level II screening.

## 2013-09-22 NOTE — Progress Notes (Signed)
Physical Therapy Treatment Patient Details Name: [name] MRN: [id number] DOB: October 31, 1950 Today's Date: 09/22/2013 Time: 1030-1055 PT Time Calculation (min): 25 min  PT Assessment / Plan / Recommendation  History of Present Illness Pt is a 63 y/o male admitted s/p L TKA.   PT Comments   Pt making steady progress toward goals with PT.  Follow Up Recommendations  SNF     Equipment Recommendations  None recommended by PT    Frequency 7X/week   Progress towards PT Goals Progress towards PT goals: Progressing toward goals  Plan Current plan remains appropriate    Precautions / Restrictions Precautions Precautions: Fall;Knee Precaution Comments: Discussed towel roll under heel and NO pillow under knee Required Braces or Orthoses: Knee Immobilizer - Left Knee Immobilizer - Left: On when out of bed or walking Restrictions LLE Weight Bearing: Weight bearing as tolerated       Mobility  Bed Mobility Overal bed mobility: Needs Assistance Bed Mobility: Supine to Sit Supine to sit: Min assist General bed mobility comments: assist to manage left leg to edge of bed and cues on sequence and technique to sit at edge of bed. Transfers Overall transfer level: Needs assistance Equipment used: Rolling walker (2 wheeled) Transfers: Sit to/from Stand Sit to Stand: Min assist General transfer comment: cues needed on hand and LLE placement with transfers. Ambulation/Gait Ambulation/Gait assistance: Min assist Ambulation Distance (Feet): 20 Feet Assistive device: Rolling walker (2 wheeled) Gait Pattern/deviations: Step-to pattern;Antalgic;Decreased step length - right;Decreased stance time - left Gait velocity: decreased Gait velocity interpretation: Below normal speed for age/gender General Gait Details: cues on posture, sequence, walker use/position with gait.    Exercises Total Joint Exercises Ankle Circles/Pumps: AROM;Both;10 reps;Supine Quad Sets: AAROM;Strengthening;Left;10  reps;Supine Heel Slides: AAROM;Strengthening;Left;10 reps;Supine Hip ABduction/ADduction: AAROM;Strengthening;Left;10 reps;Supine Straight Leg Raises: AAROM;Strengthening;Left;10 reps;Supine     PT Goals (current goals can now be found in the care plan section) Acute Rehab PT Goals Patient Stated Goal: To return home after rehab PT Goal Formulation: With patient Time For Goal Achievement: 10/05/13 Potential to Achieve Goals: Good  Visit Information  Last PT Received On: 09/22/13 Assistance Needed: +1 History of Present Illness: Pt is a 63 y/o male admitted s/p L TKA.    Subjective Data  Patient Stated Goal: To return home after rehab   Cognition  Cognition Arousal/Alertness: Awake/alert Behavior During Therapy: WFL for tasks assessed/performed Overall Cognitive Status: Within Functional Limits for tasks assessed       End of Session PT - End of Session Equipment Utilized During Treatment: Gait belt;Left knee immobilizer Activity Tolerance: Patient tolerated treatment well Patient left: in chair;with call bell/phone within reach Nurse Communication: Mobility status;Patient requests pain meds   GP     Willow Ora 09/22/2013, 1:08 PM

## 2013-09-22 NOTE — Plan of Care (Signed)
Problem: Consults Goal: Diagnosis- Total Joint Replacement Primary Total Knee     

## 2013-09-22 NOTE — Op Note (Signed)
NAME:  [name], [name] NO.:  [id number]  MEDICAL RECORD NO.:  [id number]  LOCATION:  [id number]                        FACILITY:  [facility]  PHYSICIAN:  Lockie Pares, M.D.    DATE OF BIRTH:  Nov 09, 1950  DATE OF PROCEDURE:  09/21/2013 DATE OF DISCHARGE:                              OPERATIVE REPORT   INDICATIONS:  A 63 year old with intractable knee pain, left knee varus deformity, consistent with osteoarthritis, thought to be amenable to total knee replacement.  PREOPERATIVE DIAGNOSIS:  Osteoarthritis, left knee.  POSTOPERATIVE DIAGNOSIS:  Osteoarthritis, left knee.  PROCEDURE:  Left total knee replacement (Sigma cemented knee size 5 femur, tibia, 15-mm bearing, 41-mm all-poly patella, with antibiotic- impregnated cement.  SURGEON:  Lockie Pares, M.D.  ASSISTANT:  Chriss Czar, PA-C  TOURNIQUET TIME:  63 minutes.  DESCRIPTION OF PROCEDURE:  Straight skin incision with a medial parapatellar approach to the knee made.  We identified the most diseased medial compartment, and cut the tibia about 4 mm below the medial plateau.  The femur was cut with an 11-mm distal 5-degree valgus cut with the extension gap measuring 15 mm.  Attention was next directed at the femur.  We sized the femur to be a size 5, placed the appropriate __________ on the block, setting the femur in slight external rotation. Final cuts were accomplished with the anterior-posterior and chamfer cuts, and then cleaned up in the posterior aspect of the knee with release of the PCL, excision of the excess menisci and some released off the posterior capsule.  Flexion gap equaled the extension gap at 15 mm. Tibia was sized to be size 5.  Keel hole was cut for the tibia followed by the box cut on the femur.  Trials were placed.  Again, all parameters deemed to be acceptable to 15-mm bearing.  Patella was cut, 41-mm all- poly patella.  Resecting 11.5 to 12 mm of bone.  Again, all trials were placed.   Full extension was noted.  Good resolution of the axis of the knee with correction of the varus deformity.  Bearing tracked normally and there was a minimal anterior __________ 90 degrees.  Trial components removed.  Bony surfaces were irrigated.  We inserted the tibia followed by the femur with a trial bearing, electing to use antibiotic-impregnated cement with 1 g of gentamicin per batch due to the patient's history of hepatitis C.  Patella was inserted as well. Cement was allowed to harden.  Trial bearing was removed.  We checked the posterior aspect of the knee for any cement, none was noted. Tourniquet was released.  Small bleeders were coagulated.  No excess bleeding was noted in the posterior aspect of the knee.  The final bearing was placed.  Again, all parameters deemed to be acceptable. Closure was affected after the Hemovac drain was placed superolaterally with interrupted #1 Ethibond, 0 and 2-0 Vicryl, and skin clips.  The patient also noted to have a preoperative femoral block, taken to the recovery room in stable condition.  Lightly compressive sterile dressing knee immobilizer applied.     Lockie Pares, M.D.     WDC/MEDQ  D:  09/21/2013  T:  09/22/2013  Job:  [id number]

## 2013-09-22 NOTE — Plan of Care (Signed)
Problem: Phase I Progression Outcomes Goal: CMS/Neurovascular status WDL Outcome: Progressing Pt to OR this  Am. Anticipating return to floor post PACU.

## 2013-09-23 LAB — CBC
HEMATOCRIT: 34.4 % — AB (ref 39.0–52.0)
Hemoglobin: 11.4 g/dL — ABNORMAL LOW (ref 13.0–17.0)
MCH: 31.1 pg (ref 26.0–34.0)
MCHC: 33.1 g/dL (ref 30.0–36.0)
MCV: 94 fL (ref 78.0–100.0)
Platelets: 226 10*3/uL (ref 150–400)
RBC: 3.66 MIL/uL — ABNORMAL LOW (ref 4.22–5.81)
RDW: 12.3 % (ref 11.5–15.5)
WBC: 11.4 10*3/uL — ABNORMAL HIGH (ref 4.0–10.5)

## 2013-09-23 NOTE — Progress Notes (Signed)
SPORTS MEDICINE AND JOINT REPLACEMENT  Lara Mulch, MD   Carlynn Spry, PA-C [address], [place], [place]  [id number]                             [phone]   PROGRESS NOTE  Subjective:  negative for Chest Pain  negative for Shortness of Breath  positive for Nausea/Vomiting   negative for Calf Pain  negative for Bowel Movement   Tolerating Diet: yes         Patient reports pain as 5 on 0-10 scale.    Objective: Vital signs in last 24 hours:   Patient Vitals for the past 24 hrs:  BP Temp Temp src Pulse Resp SpO2  09/23/13 0640 122/77 mmHg 98 F (36.7 C) Oral 65 18 100 %  09/23/13 0634 126/78 mmHg 98.7 F (37.1 C) Oral 88 18 99 %  09/23/13 0326 - - - - 17 97 %  09/23/13 0000 - - - - 18 96 %  09/22/13 2135 119/63 mmHg 99.4 F (37.4 C) Oral 84 19 95 %  09/22/13 2000 - - - - 18 97 %  09/22/13 1405 137/75 mmHg 98.5 F (36.9 C) - 97 16 98 %    @flow {1959:LAST@   Intake/Output from previous day:   02/21 0701 - 02/22 0700 In: 780 [P.O.:780] Out: 2850 [Urine:2450; Drains:400]   Intake/Output this shift:   02/22 0701 - 02/22 1900 In: 120 [P.O.:120] Out: -    Intake/Output     02/21 0701 - 02/22 0700 02/22 0701 - 02/23 0700   P.O. 780 120   I.V. (mL/kg)     Total Intake(mL/kg) 780 (8.1) 120 (1.3)   Urine (mL/kg/hr) 2450 (1.1)    Drains 400 (0.2)    Blood     Total Output 2850     Net -2070 +120        Urine Occurrence 1 x       LABORATORY DATA:  Recent Labs  09/21/13 1405 09/22/13 0445 09/23/13 0550  WBC 8.8 8.9 11.4*  HGB 13.9 13.1 11.4*  HCT 39.9 39.1 34.4*  PLT 257 245 226    Recent Labs  09/21/13 1405 09/22/13 0445  NA  --  134*  K  --  4.5  CL  --  95*  CO2  --  28  BUN  --  10  CREATININE 1.04 1.13  GLUCOSE  --  102*  CALCIUM  --  8.9   Lab Results  Component Value Date   INR 1.08 09/13/2013    Examination:  General appearance: alert, cooperative and no distress Extremities: Homans sign is negative, no sign of  DVT  Wound Exam: clean, dry, intact   Drainage:  Scant/small amount Serosanguinous exudate  Motor Exam: EHL and FHL Intact  Sensory Exam: Deep Peroneal normal   Assessment:    2 Days Post-Op  Procedure(s) (LRB): LEFT TOTAL KNEE ARTHROPLASTY (Left)  ADDITIONAL DIAGNOSIS:  Active Problems:   Osteoarthritis of left knee  Acute Blood Loss Anemia   Plan: Physical Therapy as ordered Weight Bearing as Tolerated (WBAT)    DISCHARGE PLAN: Skilled Nursing Facility/Rehab          Allante Whitmire 09/23/2013, 8:12 AM

## 2013-09-23 NOTE — Progress Notes (Signed)
Orthopedic Tech Progress Note Patient Details:  [name] 02/20/1951 [id number] On cpm at 7:50 pm LLE 0-40 Patient ID: [name], male   DOB: 03-31-1951, 63 y.o.   MRN: [id number]   [name] 09/23/2013, 7:51 PM

## 2013-09-23 NOTE — Progress Notes (Signed)
Physical Therapy Treatment Patient Details Name: [name] MRN: [id number] DOB: 05-11-1951 Today's Date: 09/23/2013 Time: 1129-1200 PT Time Calculation (min): 31 min  PT Assessment / Plan / Recommendation  History of Present Illness Pt is a 63 y/o male admitted s/p L TKA.   PT Comments   Making slow and steady progress; Pain continuing to limit ROM, expecially into flexion  Follow Up Recommendations  SNF     Does the patient have the potential to tolerate intense rehabilitation     Barriers to Discharge        Equipment Recommendations  None recommended by PT    Recommendations for Other Services    Frequency 7X/week   Progress towards PT Goals Progress towards PT goals: Progressing toward goals  Plan Current plan remains appropriate    Precautions / Restrictions Precautions Precautions: Fall;Knee Precaution Comments: Discussed towel roll under heel and NO pillow under knee Required Braces or Orthoses: Knee Immobilizer - Left Knee Immobilizer - Left: On when out of bed or walking Restrictions LLE Weight Bearing: Weight bearing as tolerated   Pertinent Vitals/Pain "100/10" L knee RN provided medication to assist with pain control patient repositioned for comfort and optimal knee extension    Mobility  Bed Mobility Overal bed mobility: Needs Assistance Bed Mobility: Supine to Sit Supine to sit: Min assist Transfers Overall transfer level: Needs assistance Equipment used: Rolling walker (2 wheeled) Transfers: Sit to/from Stand Sit to Stand: Min assist General transfer comment: cues needed on hand and LLE placement with transfers. Ambulation/Gait Ambulation/Gait assistance: Min guard Ambulation Distance (Feet): 40 Feet Assistive device: Rolling walker (2 wheeled) Gait Pattern/deviations: Step-to pattern Gait velocity: decreased General Gait Details: cues on posture, sequence, walker use/position with gait.    Exercises Total Joint Exercises Quad Sets:  AAROM;Strengthening;Left;10 reps;Supine Short Arc Quad: AAROM;Left;10 reps Heel Slides: AAROM;Strengthening;Left;10 reps;Supine Straight Leg Raises: AAROM;Strengthening;Left;10 reps;Supine   PT Diagnosis:    PT Problem List:   PT Treatment Interventions:     PT Goals (current goals can now be found in the care plan section) Acute Rehab PT Goals Patient Stated Goal: To return home after rehab PT Goal Formulation: With patient Time For Goal Achievement: 10/05/13 Potential to Achieve Goals: Good  Visit Information  Last PT Received On: 09/23/13 Assistance Needed: +1 History of Present Illness: Pt is a 63 y/o male admitted s/p L TKA.    Subjective Data  Subjective: In a lot of pain, but willing to work Patient Stated Goal: To return home after rehab   Cognition  Cognition Arousal/Alertness: Awake/alert Behavior During Therapy: WFL for tasks assessed/performed Overall Cognitive Status: Within Functional Limits for tasks assessed    Balance     End of Session PT - End of Session Equipment Utilized During Treatment: Left knee immobilizer Activity Tolerance: Patient tolerated treatment well Patient left: in chair;with call bell/phone within reach Nurse Communication: Mobility status   GP     [name] [place], [place]  09/23/2013, 1:24 PM

## 2013-09-23 NOTE — Progress Notes (Signed)
PA in this am to remove surgical dressing and replace. New dressing CDI. Pt c/o excruciating pain with l foot plexipulse, removed at his bequest. Patient states he would like to have iv analgesics only.

## 2013-09-24 ENCOUNTER — Encounter (HOSPITAL_COMMUNITY): Payer: Self-pay | Admitting: Orthopedic Surgery

## 2013-09-24 LAB — CBC
HEMATOCRIT: 32.1 % — AB (ref 39.0–52.0)
Hemoglobin: 10.8 g/dL — ABNORMAL LOW (ref 13.0–17.0)
MCH: 31.3 pg (ref 26.0–34.0)
MCHC: 33.6 g/dL (ref 30.0–36.0)
MCV: 93 fL (ref 78.0–100.0)
Platelets: 234 10*3/uL (ref 150–400)
RBC: 3.45 MIL/uL — ABNORMAL LOW (ref 4.22–5.81)
RDW: 12.1 % (ref 11.5–15.5)
WBC: 11.4 10*3/uL — ABNORMAL HIGH (ref 4.0–10.5)

## 2013-09-24 MED ORDER — DIPHENHYDRAMINE HCL 50 MG/ML IJ SOLN: 25.0000 mg | Freq: Four times a day (QID) | INTRAMUSCULAR | Status: DC | PRN

## 2013-09-24 MED ORDER — DSS 100 MG PO CAPS
100.0000 mg | ORAL_CAPSULE | Freq: Two times a day (BID) | ORAL | Status: DC
Start: 2013-09-24 — End: 2015-06-09

## 2013-09-24 MED ORDER — WHITE PETROLATUM GEL
Status: AC
  Administered 2013-09-24: 0.2
  Filled 2013-09-24: qty 5

## 2013-09-24 MED ORDER — DIPHENHYDRAMINE HCL 25 MG PO CAPS: 25.0000 mg | ORAL_CAPSULE | ORAL | Status: DC | PRN

## 2013-09-24 MED ORDER — DIPHENHYDRAMINE HCL 25 MG PO CAPS
25.0000 mg | ORAL_CAPSULE | ORAL | Status: DC | PRN
  Administered 2013-09-24 – 2013-09-25 (×3): 25 mg via ORAL
  Filled 2013-09-24 (×3): qty 1

## 2013-09-24 NOTE — Discharge Summary (Signed)
PATIENT ID: [name]        MRN:  [id number]          DOB/AGE: 61-08-1950 / 63 y.o.    DISCHARGE SUMMARY  ADMISSION DATE:    09/21/2013 DISCHARGE DATE:   09/25/2013  ADMISSION DIAGNOSIS: OA LEFT KNEE    DISCHARGE DIAGNOSIS:  OSTEOARTHRIITIS LEFT KNEE    ADDITIONAL DIAGNOSIS: Active Problems:   Osteoarthritis of left knee  Past Medical History  Diagnosis Date  . Back pain   . Cocaine abuse   . Alcohol abuse   . Arthritis   . Hepatitis     hep c    PROCEDURE: Procedure(s): LEFT TOTAL KNEE ARTHROPLASTY Left on 09/21/2013  CONSULTS: PT/OT/SW      HISTORY:  See H&P in chart  HOSPITAL COURSE:  [name] is a 63 y.o. admitted on 09/21/2013 and found to have a diagnosis of OSTEOARTHRIITIS LEFT KNEE.  After appropriate laboratory studies were obtained  they were taken to the operating room on 09/21/2013 and underwent  Procedure(s): LEFT TOTAL KNEE ARTHROPLASTY  Left.   They were given perioperative antibiotics:  Anti-infectives   Start     Dose/Rate Route Frequency Ordered Stop   09/21/13 1400  ceFAZolin (ANCEF) IVPB 1 g/50 mL premix     1 g 100 mL/hr over 30 Minutes Intravenous Every 6 hours 09/21/13 1118 09/21/13 2020   09/21/13 0600  ceFAZolin (ANCEF) IVPB 2 g/50 mL premix     2 g 100 mL/hr over 30 Minutes Intravenous On call to O.R. 09/20/13 1401 09/21/13 0745    .  Tolerated the procedure well.   POD #1, allowed out of bed to a chair.  PT for ambulation and exercise program.  IV saline locked.  O2 discontionued.  POD #2, continued PT and ambulation. Hemovac pulled. . The remainder of the hospital course was dedicated to ambulation and strengthening.   The patient was discharged on 4 days post op in  Stable condition.  POD#3 awaiting bed placement for SNF  Blood products given:none  DIAGNOSTIC STUDIES: Recent vital signs: Patient Vitals for the past 24 hrs:  BP Temp Temp src Pulse Resp SpO2  09/24/13 0622 123/54 mmHg 99.1 F (37.3 C) Oral 98 19 96 %   09/23/13 2137 130/73 mmHg 100.7 F (38.2 C) Oral 100 19 96 %  09/23/13 1548 - - - - 18 99 %  09/23/13 1408 134/74 mmHg 97.9 F (36.6 C) - 93 18 98 %  09/23/13 1200 - - - - 18 99 %       Recent laboratory studies:  Recent Labs  09/21/13 1405 09/22/13 0445 09/23/13 0550 09/24/13 0545  WBC 8.8 8.9 11.4* 11.4*  HGB 13.9 13.1 11.4* 10.8*  HCT 39.9 39.1 34.4* 32.1*  PLT 257 245 226 234    Recent Labs  09/21/13 1405 09/22/13 0445  NA  --  134*  K  --  4.5  CL  --  95*  CO2  --  28  BUN  --  10  CREATININE 1.04 1.13  GLUCOSE  --  102*  CALCIUM  --  8.9   Lab Results  Component Value Date   INR 1.08 09/13/2013     Recent Radiographic Studies :  No results found.  DISCHARGE INSTRUCTIONS:   DISCHARGE MEDICATIONS:     Medication List    STOP taking these medications       acetaminophen 325 MG tablet  Commonly known as:  TYLENOL  HYDROcodone-acetaminophen 5-325 MG per tablet  Commonly known as:  NORCO/VICODIN     ibuprofen 200 MG tablet  Commonly known as:  ADVIL,MOTRIN      TAKE these medications       mirtazapine 15 MG tablet  Commonly known as:  REMERON  Take 1 tablet (15 mg total) by mouth at bedtime as needed (insomnia).       Benadryl 25mg  po q4hrs prn itching Oxycodone 5mg  po tabs 1-2 po q4-6hrs prn pain Robaxin 500mg  po tabs 1 tab po q6hrs prn spasm Colace 100mg  po caps 1 po bid  lovenox 30mg  subcutaneous injections bid for dvt prophylaxis x2 weeks  FOLLOW UP VISIT:       Follow-up Information   Follow up with CAFFREY JR,W D, MD. Schedule an appointment as soon as possible for a visit in 2 weeks.   Specialty:  Orthopedic Surgery   Contact information:   [place] [address] [phone]       DISPOSITION:   Skilled Nursing Facility/Rehab  CONDITION:  Stable   Chriss Czar 09/24/2013, 8:00 AM

## 2013-09-24 NOTE — Progress Notes (Signed)
CSW has contacted Multiple facilities for placement. At this time, patient does not have a SNF bed available. CSW also contacted DSS to discuss patient's medicaid status. CSW will continue to follow.  Rhea Pink, MSW, [place]

## 2013-09-24 NOTE — Evaluation (Signed)
Occupational Therapy Evaluation Patient Details Name: [name] MRN: [id number] DOB: Jun 07, 1951 Today's Date: 09/24/2013 Time: 9562-1308 OT Time Calculation (min): 36 min  OT Assessment / Plan / Recommendation History of present illness Pt is a 63 y/o male admitted s/p L TKA.   Clinical Impression   This 63 y/o male presents to acute OT with decreased balance, increased pain, decreased L knee ROM, increased fall risk, decreased activity tolerance all affecting pt's ability to take care of self. Will benefit from continued acute OT with follow-up at SNF.     OT Assessment  Patient needs continued OT Services    Follow Up Recommendations  SNF;Supervision/Assistance - 24 hour    Barriers to Discharge Decreased caregiver support    Equipment Recommendations  3 in 1 bedside comode       Frequency  Min 2X/week    Precautions / Restrictions Precautions Precautions: Knee;Fall Required Braces or Orthoses: Knee Immobilizer - Left Knee Immobilizer - Left: On when out of bed or walking Restrictions Weight Bearing Restrictions: No LLE Weight Bearing: Weight bearing as tolerated   Pertinent Vitals/Pain 10/10 in LLE at surgical site, RN made aware and repositioned    ADL  Eating/Feeding: Independent Where Assessed - Eating/Feeding: Chair Grooming: Min guard Where Assessed - Grooming: Unsupported standing Upper Body Bathing: Set up Where Assessed - Upper Body Bathing: Supported sitting Lower Body Bathing: Moderate assistance Where Assessed - Lower Body Bathing: Supported sit to stand Upper Body Dressing: Set up Where Assessed - Upper Body Dressing: Supported sitting Lower Body Dressing: Minimal assistance (with use of AE) Where Assessed - Lower Body Dressing: Supported sit to Lobbyist: Magazine features editor Method: Sit to Loss adjuster, chartered: Comfort height toilet;Grab bars Toileting - Water quality scientist and Hygiene: Minimal assistance (due to  [employer]) Where Assessed - Best boy and Hygiene: Sit to stand from 3-in-1 or toilet Equipment Used: Knee Immobilizer;Reacher;Sock aid;Rolling walker;Long-handled shoe horn;Long-handled sponge;Gait belt Transfers/Ambulation Related to ADLs: min guard for all with RW and KI ADL Comments: educated on use of AE for LB ADLs, pt return demonstrate for don/doff socks and underwear; AE kit issued    OT Diagnosis: Generalized weakness;Acute pain  OT Problem List: Decreased strength;Decreased activity tolerance;Decreased range of motion;Impaired balance (sitting and/or standing);Pain;Decreased knowledge of use of DME or AE OT Treatment Interventions: Self-care/ADL training;DME and/or AE instruction;Patient/family education;Balance training   OT Goals(Current goals can be found in the care plan section) Acute Rehab OT Goals Patient Stated Goal: To return home after rehab OT Goal Formulation: With patient Time For Goal Achievement: 10/01/13 Potential to Achieve Goals: Good  Visit Information  Last OT Received On: 09/24/13 Assistance Needed: +1 History of Present Illness: Pt is a 63 y/o male admitted s/p L TKA.       Prior [place] expects to be discharged to:: Skilled nursing facility Living Arrangements: Children Additional Comments: Daughter in 20's - unclear how often she is home Prior Function Level of Independence: Independent Communication Communication: No difficulties Dominant Hand: Right         Vision/Perception Vision - History Patient Visual Report: No change from baseline   Cognition  Cognition Arousal/Alertness: Awake/alert Behavior During Therapy: WFL for tasks assessed/performed Overall Cognitive Status: Within Functional Limits for tasks assessed    Extremity/Trunk Assessment Upper Extremity Assessment Upper Extremity Assessment: Overall WFL for tasks assessed     Mobility Bed Mobility Overal bed mobility:  Needs Assistance Bed Mobility: Supine to  Sit Supine to sit: Min assist (assist for L LE only) Transfers Overall transfer level: Needs assistance Equipment used: Rolling walker (2 wheeled) Transfers: Sit to/from Stand Sit to Stand: Min guard General transfer comment: vc's for safe hand position           End of Session OT - End of Session Equipment Utilized During Treatment: Gait belt;Rolling walker;Left knee immobilizer Activity Tolerance: Patient limited by fatigue Patient left: in chair;with call bell/phone within reach Nurse Communication: Patient requests pain meds CPM Left Knee CPM Left Knee: Off       Juluis Rainier [phone] 09/24/2013, 10:42 AM

## 2013-09-24 NOTE — Progress Notes (Signed)
Physical Therapy Treatment Patient Details Name: [name] MRN: [id number] DOB: 1951-06-24 Today's Date: 09/24/2013 Time: 2831-5176 PT Time Calculation (min): 25 min  PT Assessment / Plan / Recommendation  History of Present Illness Pt is a 63 y/o male admitted s/p L TKA.   PT Comments   Pt tolerated ambulating up to 80 feet today with Min guard using rolling walker and knee immobilizer. L knee PROM flexion to 56 degrees.   Follow Up Recommendations  SNF - Pt would benefit from receiving skilled physical therapy for optimal outcomes.     Does the patient have the potential to tolerate intense rehabilitation     Barriers to Discharge        Equipment Recommendations  None recommended by PT    Recommendations for Other Services    Frequency 7X/week   Progress towards PT Goals Progress towards PT goals: Progressing toward goals  Plan Current plan remains appropriate    Precautions / Restrictions Precautions Precautions: Knee;Fall Required Braces or Orthoses: Knee Immobilizer - Left Knee Immobilizer - Left: On when out of bed or walking Restrictions Weight Bearing Restrictions: Yes LLE Weight Bearing: Weight bearing as tolerated   Pertinent Vitals/Pain Pt states pain at 7/10 and reports he just recently received pain medication    Mobility  Bed Mobility Overal bed mobility: Needs Assistance Bed Mobility: Supine to Sit Supine to sit: Min assist (assist for L LE only) Transfers Overall transfer level: Needs assistance Equipment used: Rolling walker (2 wheeled) Transfers: Sit to/from Stand Sit to Stand: Min guard General transfer comment: Verbal cues for sequencing and hand placement Ambulation/Gait Ambulation/Gait assistance: Min guard Ambulation Distance (Feet): 80 Feet Assistive device: Rolling walker (2 wheeled) Gait Pattern/deviations: Step-to pattern Gait velocity: decreased Gait velocity interpretation: Below normal speed for age/gender General Gait Details:  cues on posture, sequence, walker use/position with gait.    Exercises Total Joint Exercises Ankle Circles/Pumps: AROM;Both;10 reps Long Arc Quad: AAROM;Left;5 reps   PT Diagnosis:    PT Problem List:   PT Treatment Interventions:     PT Goals (current goals can now be found in the care plan section) Acute Rehab PT Goals Patient Stated Goal: To return home after rehab PT Goal Formulation: With patient Time For Goal Achievement: 10/05/13 Potential to Achieve Goals: Good  Visit Information  Last PT Received On: 09/24/13 Assistance Needed: +1 History of Present Illness: Pt is a 63 y/o male admitted s/p L TKA.    Subjective Data  Subjective: Pt states he is in less pain today and is ready to walk Patient Stated Goal: To return home after rehab   Cognition  Cognition Arousal/Alertness: Awake/alert Behavior During Therapy: WFL for tasks assessed/performed Overall Cognitive Status: Within Functional Limits for tasks assessed    Balance     End of Session PT - End of Session Equipment Utilized During Treatment: Left knee immobilizer Activity Tolerance: Patient tolerated treatment well Patient left: in chair;with call bell/phone within reach   [place], [place] with [name] [phone]  [place] 09/24/2013, 2:25 PM

## 2013-09-24 NOTE — Progress Notes (Signed)
Orthopedic Tech Progress Note Patient Details:  Verbon Giangregorio May 17, 1951 [id number]  Patient ID: Annye English, male   DOB: 1950-10-13, 63 y.o.   MRN: [id number] ploaced pt's lle in cpm @ 0-40 degrees @ [address], [place] 09/24/2013, 8:08 PM

## 2013-09-24 NOTE — Progress Notes (Signed)
Subjective: 3 Days Post-Op Procedure(s) (LRB): LEFT TOTAL KNEE ARTHROPLASTY (Left) Patient reports pain as mild and moderate.  C/o itching.  He did tolerated po oxycodone IR prior to hospital admission.  Objective: Vital signs in last 24 hours: Temp:  [97.9 F (36.6 C)-100.7 F (38.2 C)] 99.1 F (37.3 C) (02/23 0622) Pulse Rate:  [93-100] 98 (02/23 0622) Resp:  [18-19] 19 (02/23 0622) BP: (123-134)/(54-74) 123/54 mmHg (02/23 0622) SpO2:  [96 %-99 %] 96 % (02/23 0622)  Intake/Output from previous day: 02/22 0701 - 02/23 0700 In: 1210 [P.O.:840; I.V.:370] Out: -  Intake/Output this shift:     Recent Labs  09/21/13 1405 09/22/13 0445 09/23/13 0550 09/24/13 0545  HGB 13.9 13.1 11.4* 10.8*    Recent Labs  09/23/13 0550 09/24/13 0545  WBC 11.4* 11.4*  RBC 3.66* 3.45*  HCT 34.4* 32.1*  PLT 226 234    Recent Labs  09/21/13 1405 09/22/13 0445  NA  --  134*  K  --  4.5  CL  --  95*  CO2  --  28  BUN  --  10  CREATININE 1.04 1.13  GLUCOSE  --  102*  CALCIUM  --  8.9   No results found for this basename: LABPT, INR,  in the last 72 hours  Neurovascular intact Sensation intact distally Intact pulses distally Dorsiflexion/Plantar flexion intact Incision: dressing C/D/I No cellulitis present Compartment soft Dressing changed  Assessment/Plan: 3 Days Post-Op Procedure(s) (LRB): LEFT TOTAL KNEE ARTHROPLASTY (Left) Up with therapy Discharge to SNF Order prn diphenhydramine, cont oxycodone for pain control, newly diagnosed Hep C would like to avoid tylenol containing meds if at all possible.  Chriss Czar 09/24/2013, 8:25 AM

## 2013-09-25 ENCOUNTER — Other Ambulatory Visit: Payer: Self-pay | Admitting: Physician Assistant

## 2013-09-25 NOTE — Progress Notes (Signed)
Report called to receiving RN at [facility] in [place] - patient left via w/c transport in stable condition with copy of chart, etc.

## 2013-09-25 NOTE — Progress Notes (Signed)
Physical Therapy Treatment Patient Details Name: [name] MRN: [id number] DOB: 06/23/1951 Today's Date: 09/25/2013 Time: 9629-5284 PT Time Calculation (min): 20 min  PT Assessment / Plan / Recommendation  History of Present Illness Pt is a 63 y/o male admitted s/p L TKA.   PT Comments   Continuing progress with mobility despite pain; Noted for SNF, recommend continuing rehab at SNF to maximize independence and safety with mobility prior to dc home  Completed stair training and pt was able to verbalize technique   Follow Up Recommendations  SNF     Does the patient have the potential to tolerate intense rehabilitation     Barriers to Discharge        Equipment Recommendations  None recommended by PT    Recommendations for Other Services    Frequency 7X/week   Progress towards PT Goals Progress towards PT goals: Progressing toward goals  Plan Current plan remains appropriate    Precautions / Restrictions Precautions Precautions: Knee;Fall Required Braces or Orthoses: Knee Immobilizer - Left Knee Immobilizer - Left: On when out of bed or walking Restrictions Weight Bearing Restrictions: Yes LLE Weight Bearing: Weight bearing as tolerated   Pertinent Vitals/Pain "11/10" LLE knee patient repositioned for comfort and optimal knee ext    Mobility  Bed Mobility Overal bed mobility: Needs Assistance Bed Mobility: Supine to Sit Supine to sit: Min assist General bed mobility comments: assist to manage left leg to edge of bed Transfers Overall transfer level: Needs assistance Equipment used: Rolling walker (2 wheeled) Transfers: Sit to/from Stand Sit to Stand: Min guard Stand pivot transfers: Min assist General transfer comment: Verbal cues for sequencing and L foot placement Ambulation/Gait Ambulation/Gait assistance: Min guard Ambulation Distance (Feet): 60 Feet Assistive device: Rolling walker (2 wheeled) Gait Pattern/deviations: Step-to pattern;Decreased stance  time - left Gait velocity: decreased Gait velocity interpretation: Below normal speed for age/gender General Gait Details: cues on posture, sequence, walker use/position with gait. Stairs: Yes Stairs assistance: +2 safety/equipment Stair Management: With walker;Backwards;Step to pattern;No rails Number of Stairs: 3 General stair comments: Pt demonstrated safely ascending/descending stairs with RW, +2 for safety and to help control walker. (Guarding from back, with second PT blocking RW on steps)    Exercises     PT Diagnosis:    PT Problem List:   PT Treatment Interventions:     PT Goals (current goals can now be found in the care plan section) Acute Rehab PT Goals PT Goal Formulation: With patient Time For Goal Achievement: 10/05/13 Potential to Achieve Goals: Good  Visit Information  Last PT Received On: 09/25/13 Assistance Needed: +1 History of Present Illness: Pt is a 63 y/o male admitted s/p L TKA.    Subjective Data  Subjective: Pt reports his L knee is in pain but is willing to get out of bed and practice acending/decending stairs.   Cognition  Cognition Arousal/Alertness: Awake/alert Behavior During Therapy: WFL for tasks assessed/performed Overall Cognitive Status: Within Functional Limits for tasks assessed    Balance     End of Session PT - End of Session Equipment Utilized During Treatment: Gait belt Activity Tolerance: Patient limited by pain Patient left: in chair;with call bell/phone within reach Nurse Communication: Patient requests pain meds   GP     Roney Marion [facility] [place], [place] With Candie Mile 09/25/2013, 2:56 PM

## 2013-09-25 NOTE — Progress Notes (Signed)
Clinical social worker assisted with patient discharge to skilled nursing facility, [facility].  CSW addressed all family questions and concerns. CSW copied chart and added all important documents. CSW also set up patient transportation with [employer] operational officer. Clinical Social Worker will sign off for now as social work intervention is no longer needed.   Rhea Pink, MSW, [place]

## 2013-10-01 ENCOUNTER — Other Ambulatory Visit: Payer: Self-pay | Admitting: Neurosurgery

## 2013-10-01 DIAGNOSIS — M549 Dorsalgia, unspecified: Secondary | ICD-10-CM

## 2013-10-08 ENCOUNTER — Encounter (HOSPITAL_COMMUNITY): Payer: Self-pay | Admitting: Orthopedic Surgery

## 2013-10-08 NOTE — OR Nursing (Signed)
Late Entry on 10-08-2013 by D. Marlaine Arey, RN to add Surgical procedure End time. 

## 2013-11-13 ENCOUNTER — Ambulatory Visit: Payer: Medicaid Other | Attending: Orthopedic Surgery

## 2013-11-13 DIAGNOSIS — IMO0001 Reserved for inherently not codable concepts without codable children: Secondary | ICD-10-CM | POA: Diagnosis present

## 2013-11-13 DIAGNOSIS — R262 Difficulty in walking, not elsewhere classified: Secondary | ICD-10-CM | POA: Insufficient documentation

## 2013-11-13 DIAGNOSIS — M25569 Pain in unspecified knee: Secondary | ICD-10-CM | POA: Diagnosis not present

## 2013-11-23 ENCOUNTER — Other Ambulatory Visit: Payer: Self-pay | Admitting: Neurosurgery

## 2013-11-23 DIAGNOSIS — M549 Dorsalgia, unspecified: Secondary | ICD-10-CM

## 2013-11-27 ENCOUNTER — Ambulatory Visit: Payer: Medicaid Other | Admitting: Physical Therapy

## 2013-11-27 DIAGNOSIS — IMO0001 Reserved for inherently not codable concepts without codable children: Secondary | ICD-10-CM | POA: Diagnosis not present

## 2013-11-29 ENCOUNTER — Ambulatory Visit: Payer: Medicaid Other

## 2013-11-29 DIAGNOSIS — IMO0001 Reserved for inherently not codable concepts without codable children: Secondary | ICD-10-CM | POA: Diagnosis not present

## 2013-11-30 ENCOUNTER — Ambulatory Visit: Payer: Self-pay

## 2013-12-04 ENCOUNTER — Ambulatory Visit: Payer: Medicaid Other | Attending: Orthopedic Surgery | Admitting: Physical Therapy

## 2013-12-04 DIAGNOSIS — M25669 Stiffness of unspecified knee, not elsewhere classified: Secondary | ICD-10-CM | POA: Insufficient documentation

## 2013-12-04 DIAGNOSIS — IMO0001 Reserved for inherently not codable concepts without codable children: Secondary | ICD-10-CM | POA: Insufficient documentation

## 2013-12-04 DIAGNOSIS — M25569 Pain in unspecified knee: Secondary | ICD-10-CM | POA: Insufficient documentation

## 2013-12-04 DIAGNOSIS — R262 Difficulty in walking, not elsewhere classified: Secondary | ICD-10-CM | POA: Insufficient documentation

## 2013-12-05 ENCOUNTER — Other Ambulatory Visit: Payer: Self-pay

## 2013-12-06 ENCOUNTER — Other Ambulatory Visit: Payer: Self-pay

## 2013-12-11 ENCOUNTER — Ambulatory Visit: Payer: Medicaid Other

## 2013-12-11 DIAGNOSIS — IMO0001 Reserved for inherently not codable concepts without codable children: Secondary | ICD-10-CM | POA: Diagnosis present

## 2013-12-11 DIAGNOSIS — R262 Difficulty in walking, not elsewhere classified: Secondary | ICD-10-CM | POA: Diagnosis not present

## 2013-12-11 DIAGNOSIS — M25569 Pain in unspecified knee: Secondary | ICD-10-CM | POA: Diagnosis not present

## 2013-12-11 DIAGNOSIS — M25669 Stiffness of unspecified knee, not elsewhere classified: Secondary | ICD-10-CM | POA: Diagnosis not present

## 2013-12-13 ENCOUNTER — Ambulatory Visit: Payer: Medicaid Other

## 2013-12-13 DIAGNOSIS — IMO0001 Reserved for inherently not codable concepts without codable children: Secondary | ICD-10-CM | POA: Diagnosis not present

## 2014-01-17 ENCOUNTER — Encounter (INDEPENDENT_AMBULATORY_CARE_PROVIDER_SITE_OTHER): Payer: Self-pay

## 2014-01-17 ENCOUNTER — Encounter: Payer: Self-pay | Admitting: Neurology

## 2014-01-17 ENCOUNTER — Ambulatory Visit (INDEPENDENT_AMBULATORY_CARE_PROVIDER_SITE_OTHER): Payer: Medicaid Other | Admitting: Neurology

## 2014-01-17 VITALS — BP 134/86 | HR 104 | Ht 71.0 in | Wt 201.0 lb

## 2014-01-17 DIAGNOSIS — M545 Low back pain, unspecified: Secondary | ICD-10-CM

## 2014-01-17 DIAGNOSIS — G8929 Other chronic pain: Secondary | ICD-10-CM

## 2014-01-17 MED ORDER — DULOXETINE HCL 60 MG PO CPEP: 60.0000 mg | ORAL_CAPSULE | Freq: Every day | ORAL | Status: DC

## 2014-01-17 NOTE — Progress Notes (Signed)
PATIENT: [name] DOB: 1950-10-08  HISTORICAL  [name] is referred by his primary care nurse practitioner Wendie Simmer, and previous neurosurgeon Dr. Arnoldo Morale for evaluation of low back pain.  In 1999, he fell from 50 feet height, with lumbar compression fracture, also suffered cervical myelopathy, he had his surgery the same day the neurosurgeon Dr. Arnoldo Morale, C4-5 anterior cervical discectomy, fusion, plating,  Over the years, he suffered chronic low back pain, acute worsening since 2011, he complains of midline left low back pain, radiating pain to bilateral lower extremity, especially bearing weight, prolonged walking, he denies bilateral lower extremity numbness, no bowel bladder incontinence  He was reevaluated by Dr. Arnoldo Morale since 2012, was given prescription of Vicodin, and also had repeat MRI of lumbar in 2012, again in 2013, have reviewed MRI lumbar together from [facility] in 2013, which has demonstrated Multilevel spondylosis, Stable congenital and acquired stenosis L4-L5 with bilateral L5 and left greater than right L4 nerve root encroachment. Fatty replaced sacrum and iliac bones, stable. Stable healed mild L3 compression fracture.  Increased central and rightward protrusion L5-S1 extending into the foramen. Distended and thick-walled bladder, possible prostate disease or urinary tract infection.  CT myelogram was planned, but he was a no-show, per patient, he was referred to pain management,   Now he complains unbearable moderate severe chronic low back pain, difficulty walking due to pain,  Per record, the patient also has a history of cocaine abuse, most recent UDS was positive for cocaine in November 2014  REVIEW OF SYSTEMS: Full 14 system review of systems performed and notable only for double vision, loss of vision, joint pain, joint swelling, achy muscles, weakness, insomnia, not enough sleep  ALLERGIES: No Known Allergies  HOME  MEDICATIONS: Current Outpatient Prescriptions on File Prior to Visit  Medication Sig Dispense Refill  . diphenhydrAMINE (BENADRYL) 25 mg capsule Take 1 capsule (25 mg total) by mouth every 4 (four) hours as needed for itching.  30 capsule  0  . docusate sodium 100 MG CAPS Take 100 mg by mouth 2 (two) times daily.  10 capsule  0  . mirtazapine (REMERON) 15 MG tablet Take 1 tablet (15 mg total) by mouth at bedtime as needed (insomnia).  30 tablet  0   No current facility-administered medications on file prior to visit.    PAST MEDICAL HISTORY: Past Medical History  Diagnosis Date  . Back pain   . Cocaine abuse   . Alcohol abuse   . Arthritis   . Hepatitis     hep c    PAST SURGICAL HISTORY: Past Surgical History  Procedure Laterality Date  . Appendectomy    . Lung surgery    . Neck surgery    . Total knee arthroplasty Left 09/21/2013    Procedure: LEFT TOTAL KNEE ARTHROPLASTY;  Surgeon: Yvette Rack., MD;  Location: [facility];  Service: Orthopedics;  Laterality: Left;    FAMILY HISTORY: No family history on file.  SOCIAL HISTORY:  History   Social History  . Marital Status: Divorced    Spouse Name: N/A    Number of Children: 2  . Years of Education: N/A   Occupational History  . Disability    Social History Main Topics  . Smoking status: Never Smoker   . Smokeless tobacco: Never Used  . Alcohol Use: 21.6 oz/week    36 Cans of beer per week     Comment: quit for 1 year  . Drug Use: Yes  Special: Cocaine  . Sexual Activity: Not on file   Other Topics Concern  . Not on file   Social History Narrative  . No narrative on file   PHYSICAL EXAM   There were no vitals filed for this visit.  Not recorded    There is no weight on file to calculate BMI.   Generalized: In no acute distress  Neck: Supple, no carotid bruits   Cardiac: Regular rate rhythm  Pulmonary: Clear to auscultation bilaterally  Musculoskeletal: No deformity  Neurological  examination  Mentation: Alert oriented to time, place, history taking, and causual conversation  Cranial nerve II-XII: Pupils were equal round reactive to light. Extraocular movements were full.  Visual field were full on confrontational test. Bilateral fundi were sharp.  Facial sensation and strength were normal. Hearing was intact to finger rubbing bilaterally. Uvula tongue midline.  Head turning and shoulder shrug and were normal and symmetric.Tongue protrusion into cheek strength was normal.  Motor: Normal tone, bulk and strength.  Sensory: Intact to fine touch, pinprick, preserved vibratory sensation, and proprioception at toes.  Coordination: Normal finger to nose, heel-to-shin bilaterally there was no truncal ataxia  Gait: ataglic gait, cautious, tends to splint his low back.  Romberg signs: Negative  Deep tendon reflexes: Brachioradialis 2/2, biceps 2/2, triceps 2/2, patellar 2/2, Achilles trace. plantar responses were flexor bilaterally.   DIAGNOSTIC DATA (LABS, IMAGING, TESTING) - I reviewed patient records, labs, notes, testing and imaging myself where available.  Lab Results  Component Value Date   WBC 11.4* 09/24/2013   HGB 10.8* 09/24/2013   HCT 32.1* 09/24/2013   MCV 93.0 09/24/2013   PLT 234 09/24/2013      Component Value Date/Time   NA 134* 09/22/2013 0445   K 4.5 09/22/2013 0445   CL 95* 09/22/2013 0445   CO2 28 09/22/2013 0445   GLUCOSE 102* 09/22/2013 0445   BUN 10 09/22/2013 0445   CREATININE 1.13 09/22/2013 0445   CALCIUM 8.9 09/22/2013 0445   PROT 8.5* 09/13/2013 1121   ALBUMIN 4.2 09/13/2013 1121   AST 72* 09/13/2013 1121   ALT 77* 09/13/2013 1121   ALKPHOS 59 09/13/2013 1121   BILITOT 0.3 09/13/2013 1121   GFRNONAA 69* 09/22/2013 0445   GFRAA 80* 09/22/2013 0445    ASSESSMENT AND PLAN  [name] is a 63 y.o. male complains of  chronic low back pain, extensive evaluation in the past, including MRI lumbar in 2013, which showed multilevel spondylosis. Stable  congenital and acquired stenosis L4-L5 with bilateral L5 and left greater than right L4 nerve root encroachment.  He has mild atalgic gait, no significant distal weakness, or sensory loss.  1. Proceed with MRI lumbar 2. EMG/NCS. 3. Cymbalta 60 mg every day    Marcial Pacas, M.D. Ph.D.  [facility] [address], [place], [place] [address] [phone]

## 2014-01-29 ENCOUNTER — Ambulatory Visit: Payer: Medicaid Other | Attending: Orthopedic Surgery | Admitting: Physical Therapy

## 2014-01-29 DIAGNOSIS — IMO0001 Reserved for inherently not codable concepts without codable children: Secondary | ICD-10-CM | POA: Insufficient documentation

## 2014-01-29 DIAGNOSIS — M25669 Stiffness of unspecified knee, not elsewhere classified: Secondary | ICD-10-CM | POA: Insufficient documentation

## 2014-01-29 DIAGNOSIS — R262 Difficulty in walking, not elsewhere classified: Secondary | ICD-10-CM | POA: Insufficient documentation

## 2014-01-29 DIAGNOSIS — M25569 Pain in unspecified knee: Secondary | ICD-10-CM | POA: Insufficient documentation

## 2014-01-31 ENCOUNTER — Ambulatory Visit: Payer: Medicaid Other | Attending: Orthopedic Surgery | Admitting: Physical Therapy

## 2014-01-31 DIAGNOSIS — M25569 Pain in unspecified knee: Secondary | ICD-10-CM | POA: Insufficient documentation

## 2014-01-31 DIAGNOSIS — IMO0001 Reserved for inherently not codable concepts without codable children: Secondary | ICD-10-CM | POA: Insufficient documentation

## 2014-01-31 DIAGNOSIS — R262 Difficulty in walking, not elsewhere classified: Secondary | ICD-10-CM | POA: Insufficient documentation

## 2014-01-31 DIAGNOSIS — M25669 Stiffness of unspecified knee, not elsewhere classified: Secondary | ICD-10-CM | POA: Insufficient documentation

## 2014-02-24 ENCOUNTER — Emergency Department (HOSPITAL_COMMUNITY)
Admission: EM | Admit: 2014-02-24 | Discharge: 2014-02-24 | Disposition: A | Discharge status: Home / Self Care | Payer: Medicaid Other | Attending: Emergency Medicine | Admitting: Emergency Medicine

## 2014-02-24 ENCOUNTER — Encounter (HOSPITAL_COMMUNITY): Payer: Self-pay | Admitting: Emergency Medicine

## 2014-02-24 DIAGNOSIS — Z79899 Other long term (current) drug therapy: Secondary | ICD-10-CM | POA: Insufficient documentation

## 2014-02-24 DIAGNOSIS — Z8619 Personal history of other infectious and parasitic diseases: Secondary | ICD-10-CM | POA: Insufficient documentation

## 2014-02-24 DIAGNOSIS — M129 Arthropathy, unspecified: Secondary | ICD-10-CM | POA: Insufficient documentation

## 2014-02-24 DIAGNOSIS — M545 Low back pain, unspecified: Secondary | ICD-10-CM

## 2014-02-24 DIAGNOSIS — G8929 Other chronic pain: Secondary | ICD-10-CM | POA: Diagnosis not present

## 2014-02-24 DIAGNOSIS — Z791 Long term (current) use of non-steroidal anti-inflammatories (NSAID): Secondary | ICD-10-CM | POA: Insufficient documentation

## 2014-02-24 DIAGNOSIS — Z760 Encounter for issue of repeat prescription: Secondary | ICD-10-CM | POA: Insufficient documentation

## 2014-02-24 MED ORDER — HYDROCODONE-ACETAMINOPHEN 10-300 MG PO TABS: 1.0000 | ORAL_TABLET | Freq: Four times a day (QID) | ORAL | Status: DC | PRN

## 2014-02-24 NOTE — ED Notes (Signed)
Pt is here with lower chronic back pain since falling off a building in 1999.  He states since being referred to the Pain clinic, now he is having trouble walking, which made him fall recently.  Pt denies incontinence of bowel or bladder.

## 2014-02-24 NOTE — ED Provider Notes (Signed)
CSN: [id number]     Arrival date & time 02/24/14  1638 History   First MD Initiated Contact with Patient 02/24/14 1728     Chief Complaint  Patient presents with  . Back Pain    Trouble walking     (Consider location/radiation/quality/duration/timing/severity/associated sxs/prior Treatment) HPI Comments: Patient is a 63 year old male with history of chronic low back pain secondary to a fall from a building 15 years ago. He is under the care of Dr. Arnoldo Morale until April when he was transferred to pain management. He had been taking Vicodin, however he was given Cymbalta at pain management. He states this is not helping his discomfort. He denies any new injury or trauma. He denies any weakness of the legs or bowel or bladder complaints. He states he is having difficulty walking due to the pain.  Patient is a 63 y.o. male presenting with back pain. The history is provided by the patient.  Back Pain Location:  Lumbar spine Quality:  Stabbing Radiates to:  Does not radiate Pain severity:  Severe Pain is:  Same all the time Timing:  Constant Progression:  Unchanged   Past Medical History  Diagnosis Date  . Back pain   . Cocaine abuse   . Alcohol abuse   . Arthritis   . Hepatitis     hep c   Past Surgical History  Procedure Laterality Date  . Appendectomy    . Lung surgery    . Neck surgery    . Total knee arthroplasty Left 09/21/2013    Procedure: LEFT TOTAL KNEE ARTHROPLASTY;  Surgeon: Yvette Rack., MD;  Location: [facility];  Service: Orthopedics;  Laterality: Left;   No family history on file. History  Substance Use Topics  . Smoking status: Never Smoker   . Smokeless tobacco: Never Used  . Alcohol Use: 21.6 oz/week    36 Cans of beer per week     Comment: quit for 1 year    Review of Systems  Musculoskeletal: Positive for back pain.  All other systems reviewed and are negative.     Allergies  Review of patient's allergies indicates no known allergies.  Home  Medications   Prior to Admission medications   Medication Sig Start Date End Date Taking? Authorizing Provider  acetaminophen (TYLENOL) 325 MG tablet Take by mouth.    Historical Provider, MD  diphenhydrAMINE (BENADRYL) 25 mg capsule Take 1 capsule (25 mg total) by mouth every 4 (four) hours as needed for itching. 09/24/13   Chriss Czar, PA-C  docusate sodium 100 MG CAPS Take 100 mg by mouth 2 (two) times daily. 09/24/13   Chriss Czar, PA-C  DULoxetine (CYMBALTA) 60 MG capsule Take 1 capsule (60 mg total) by mouth daily. 01/17/14   Marcial Pacas, MD  mirtazapine (REMERON) 15 MG tablet Take 1 tablet (15 mg total) by mouth at bedtime as needed (insomnia). 06/17/13   Waylan Boga, NP  naproxen sodium (RA NAPROXEN SODIUM) 220 MG tablet Take by mouth.    Historical Provider, MD  oxyCODONE-acetaminophen (PERCOCET/ROXICET) 5-325 MG per tablet Take by mouth.    Historical Provider, MD   BP 145/92  Pulse 100  Temp(Src) 98 F (36.7 C) (Oral)  Resp 22  SpO2 100% Physical Exam  Nursing note and vitals reviewed. Constitutional: He is oriented to person, place, and time. He appears well-developed and well-nourished. No distress.  HENT:  Head: Normocephalic and atraumatic.  Neck: Normal range of motion. Neck supple.  Musculoskeletal:  There is  tenderness to palpation in the soft tissues of the lumbar region. There is no bony tenderness and no step-offs.  Neurological: He is alert and oriented to person, place, and time.  DTRs are 1+ and symmetrical in the Achilles and patellar tendons. Strength is 5 out of 5 in the bilateral lower extremities. He is able ambulate without significant difficulty.  Skin: Skin is warm and dry. He is not diaphoretic.    ED Course  Procedures (including critical care time) Labs Review Labs Reviewed - No data to display  Imaging Review No results found.   EKG Interpretation None      MDM   Final diagnoses:  None    Patient is requesting a refill of  Vicodin. He will be prescribed a limited quantity of this and will be advised to followup with pain management for further prescriptions. There is nothing that suggests an emergent cause of his discomfort.    Veryl Speak, MD 02/24/14 [phone]

## 2014-02-24 NOTE — ED Notes (Signed)
Pt states PCP prescribed Cymbalta 325mg  w/ no pain relief. Pt states pain throbbing at times. Chronic pain since October 2012, progressively getting worse. Has been following up with [facility] since July 7th, w/ an appointment on August 29th.

## 2014-02-24 NOTE — ED Notes (Signed)
MD at bedside. 

## 2014-02-24 NOTE — Discharge Instructions (Signed)
Hydrocodone as prescribed as needed for pain.  Followup with your pain management physician for future prescriptions.   Back Pain, Adult Low back pain is very common. About 1 in 5 people have back pain.The cause of low back pain is rarely dangerous. The pain often gets better over time.About half of people with a sudden onset of back pain feel better in just 2 weeks. About 8 in 10 people feel better by 6 weeks.  CAUSES Some common causes of back pain include:  Strain of the muscles or ligaments supporting the spine.  Wear and tear (degeneration) of the spinal discs.  Arthritis.  Direct injury to the back. DIAGNOSIS Most of the time, the direct cause of low back pain is not known.However, back pain can be treated effectively even when the exact cause of the pain is unknown.Answering your caregiver's questions about your overall health and symptoms is one of the most accurate ways to make sure the cause of your pain is not dangerous. If your caregiver needs more information, he or she may order lab work or imaging tests (X-rays or MRIs).However, even if imaging tests show changes in your back, this usually does not require surgery. HOME CARE INSTRUCTIONS For many people, back pain returns.Since low back pain is rarely dangerous, it is often a condition that people can learn to [facility] their own.   Remain active. It is stressful on the back to sit or stand in one place. Do not sit, drive, or stand in one place for more than 30 minutes at a time. Take short walks on level surfaces as soon as pain allows.Try to increase the length of time you walk each day.  Do not stay in bed.Resting more than 1 or 2 days can delay your recovery.  Do not avoid exercise or work.Your body is made to move.It is not dangerous to be active, even though your back may hurt.Your back will likely heal faster if you return to being active before your pain is gone.  Pay attention to your body when you bend  and lift. Many people have less discomfortwhen lifting if they bend their knees, keep the load close to their bodies,and avoid twisting. Often, the most comfortable positions are those that put less stress on your recovering back.  Find a comfortable position to sleep. Use a firm mattress and lie on your side with your knees slightly bent. If you lie on your back, put a pillow under your knees.  Only take over-the-counter or prescription medicines as directed by your caregiver. Over-the-counter medicines to reduce pain and inflammation are often the most helpful.Your caregiver may prescribe muscle relaxant drugs.These medicines help dull your pain so you can more quickly return to your normal activities and healthy exercise.  Put ice on the injured area.  Put ice in a plastic bag.  Place a towel between your skin and the bag.  Leave the ice on for 15-20 minutes, 03-04 times a day for the first 2 to 3 days. After that, ice and heat may be alternated to reduce pain and spasms.  Ask your caregiver about trying back exercises and gentle massage. This may be of some benefit.  Avoid feeling anxious or stressed.Stress increases muscle tension and can worsen back pain.It is important to recognize when you are anxious or stressed and learn ways to manage it.Exercise is a great option. SEEK MEDICAL CARE IF:  You have pain that is not relieved with rest or medicine.  You have pain that  does not improve in 1 week.  You have new symptoms.  You are generally not feeling well. SEEK IMMEDIATE MEDICAL CARE IF:   You have pain that radiates from your back into your legs.  You develop new bowel or bladder control problems.  You have unusual weakness or numbness in your arms or legs.  You develop nausea or vomiting.  You develop abdominal pain.  You feel faint. Document Released: 07/19/2005 Document Revised: 01/18/2012 Document Reviewed: 11/20/2013 [facility] Patient Information 2015  [place], [place]. This information is not intended to replace advice given to you by your health care provider. Make sure you discuss any questions you have with your health care provider.

## 2014-02-25 ENCOUNTER — Encounter: Payer: Medicaid Other | Admitting: Neurology

## 2014-03-20 ENCOUNTER — Other Ambulatory Visit: Payer: Self-pay | Admitting: Physical Medicine and Rehabilitation

## 2014-03-20 DIAGNOSIS — G894 Chronic pain syndrome: Secondary | ICD-10-CM

## 2014-03-20 DIAGNOSIS — M47817 Spondylosis without myelopathy or radiculopathy, lumbosacral region: Secondary | ICD-10-CM

## 2014-03-28 ENCOUNTER — Ambulatory Visit
Admission: RE | Admit: 2014-03-28 | Discharge: 2014-03-28 | Disposition: A | Discharge status: Home / Self Care | Payer: Medicaid Other | Source: Ambulatory Visit | Attending: Physical Medicine and Rehabilitation | Admitting: Physical Medicine and Rehabilitation

## 2014-03-28 DIAGNOSIS — M47817 Spondylosis without myelopathy or radiculopathy, lumbosacral region: Secondary | ICD-10-CM

## 2014-03-28 DIAGNOSIS — G894 Chronic pain syndrome: Secondary | ICD-10-CM

## 2014-05-28 ENCOUNTER — Other Ambulatory Visit: Payer: Self-pay | Admitting: Neurosurgery

## 2014-05-28 DIAGNOSIS — M545 Low back pain: Secondary | ICD-10-CM

## 2014-06-05 ENCOUNTER — Ambulatory Visit
Admission: RE | Admit: 2014-06-05 | Discharge: 2014-06-05 | Disposition: A | Discharge status: Home / Self Care | Payer: Medicaid Other | Source: Ambulatory Visit | Attending: Neurosurgery | Admitting: Neurosurgery

## 2014-06-05 ENCOUNTER — Other Ambulatory Visit: Payer: Self-pay | Admitting: Neurosurgery

## 2014-06-05 DIAGNOSIS — M545 Low back pain: Secondary | ICD-10-CM

## 2014-06-05 DIAGNOSIS — G8929 Other chronic pain: Secondary | ICD-10-CM

## 2014-06-05 MED ORDER — IOHEXOL 180 MG/ML  SOLN
6.0000 mL | Freq: Once | INTRAMUSCULAR | Status: DC | PRN
Start: 2014-06-05 — End: 2014-06-05

## 2014-06-05 MED ORDER — IOHEXOL 180 MG/ML  SOLN
6.0000 mL | Freq: Once | INTRAMUSCULAR | Status: AC | PRN
  Administered 2014-06-05: 3 mL
  Administered 2014-06-05: 6 mL

## 2014-06-05 MED ORDER — CEFAZOLIN SODIUM-DEXTROSE 2-3 GM-% IV SOLR
2.0000 g | Freq: Once | INTRAVENOUS | Status: AC
  Administered 2014-06-05: 2 g via INTRAVENOUS

## 2014-06-05 MED ORDER — FENTANYL CITRATE 0.05 MG/ML IJ SOLN
25.0000 ug | INTRAMUSCULAR | Status: DC | PRN
  Administered 2014-06-05: 100 ug via INTRAVENOUS

## 2014-06-05 MED ORDER — MIDAZOLAM HCL 2 MG/2ML IJ SOLN
1.0000 mg | INTRAMUSCULAR | Status: DC | PRN
  Administered 2014-06-05 (×2): 1 mg via INTRAVENOUS

## 2014-06-05 MED ORDER — KETOROLAC TROMETHAMINE 30 MG/ML IJ SOLN
30.0000 mg | Freq: Once | INTRAMUSCULAR | Status: AC
  Administered 2014-06-05: 30 mg via INTRAVENOUS

## 2014-06-05 MED ORDER — SODIUM CHLORIDE 0.9 % IV SOLN
Freq: Once | INTRAVENOUS | Status: AC
  Administered 2014-06-05: 08:00:00 via INTRAVENOUS

## 2014-06-05 NOTE — Discharge Instructions (Signed)
Discogram Post Procedure Discharge Instructions ° °1. May resume a regular diet and any medications that you routinely take (including pain medications). °2. No driving day of procedure. °3. Upon discharge go home and rest for at least 4 hours.  May use an ice pack as needed to injection sites on back.  Ice to back 30 minutes on and 30 minutes off, all day. °4. May remove bandades later, today. °5. It is not unusual to be sore for several days after this procedure. ° ° ° °Please contact our office at [phone] for the following symptoms: ° °· Fever greater than 100 degrees °· Increased swelling, pain, or redness at injection site. ° ° °Thank you for visiting [facility]. ° ° °

## 2014-11-04 ENCOUNTER — Telehealth: Payer: Self-pay | Admitting: *Deleted

## 2014-11-04 NOTE — Telephone Encounter (Signed)
Attempted to call patient regarding Hep C referral from Juluis Mire, NP and his phone does not take voice mail and incorrect cell #. Notified Delcie Roch and she will attempt to contact patient. If he calls back, he can be added any day to the lab schedule. Myrtis Hopping

## 2014-11-05 NOTE — Telephone Encounter (Signed)
Patient returned call, is scheduled for labs on 11/12/14 at 11:30.  He uses Medicaid transportation, needs at least 3 days notice for appointments.  Patient given address of [facility]. Landis Gandy, RN

## 2014-11-12 ENCOUNTER — Other Ambulatory Visit: Payer: Medicaid Other

## 2014-11-12 DIAGNOSIS — B182 Chronic viral hepatitis C: Secondary | ICD-10-CM

## 2014-11-12 LAB — IRON: IRON: 118 ug/dL (ref 42–165)

## 2014-11-12 NOTE — Addendum Note (Signed)
Addended by: Dolan Amen D on: 11/12/2014 03:47 PM   Modules accepted: Orders

## 2014-11-13 LAB — HEPATITIS C RNA QUANTITATIVE
HCV QUANT LOG: 6.1 {Log} — AB (ref <1.18)
HCV Quantitative: 1271310 IU/mL — ABNORMAL HIGH (ref <15)

## 2014-11-13 LAB — PROTIME-INR
INR: 1.05 (ref <1.50)
Prothrombin Time: 13.7 seconds (ref 11.6–15.2)

## 2014-11-13 LAB — HIV ANTIBODY (ROUTINE TESTING W REFLEX): HIV: NONREACTIVE

## 2014-11-15 LAB — HEPATITIS C GENOTYPE

## 2014-12-09 ENCOUNTER — Encounter: Payer: Self-pay | Admitting: Internal Medicine

## 2014-12-09 ENCOUNTER — Ambulatory Visit (INDEPENDENT_AMBULATORY_CARE_PROVIDER_SITE_OTHER): Payer: Medicaid Other | Admitting: Internal Medicine

## 2014-12-09 VITALS — BP 133/90 | HR 73 | Temp 97.5°F | Ht 72.0 in | Wt 216.0 lb

## 2014-12-09 DIAGNOSIS — B182 Chronic viral hepatitis C: Secondary | ICD-10-CM | POA: Diagnosis not present

## 2014-12-09 MED ORDER — LEDIPASVIR-SOFOSBUVIR 90-400 MG PO TABS: 1.0000 | ORAL_TABLET | Freq: Every day | ORAL | Status: DC

## 2014-12-09 NOTE — Patient Instructions (Signed)
Date 12/09/2014  Dear Mr. Morrical, As discussed in the [facility], your hepatitis C therapy will include the following medications:          Harvoni 90mg /400mg  tablet: (or Zepatier or [employer])           Take 1 tablet by mouth once daily   Please note that ALL MEDICATIONS WILL START ON THE SAME DATE for a total of 12 weeks. ---------------------------------------------------------------- Your HCV Treatment Start Date: TBA   Your HCV genotype:  1a    Liver Fibrosis: F3  ---------------------------------------------------------------- YOUR PHARMACY CONTACT:   [place] [facility] Phone: [phone] Hours: Monday to Friday 7:30 am to 6:00 pm   Please always contact your pharmacy at least 3-4 business days before you run out of medications to ensure your next month's medication is ready or 1 week prior to running out if you receive it by mail.  Remember, each prescription is for 28 days. ---------------------------------------------------------------- GENERAL NOTES REGARDING YOUR HEPATITIS C MEDICATION:  SOFOSBUVIR/LEDIPASVIR (HARVONI): - Harvoni tablet is taken daily with OR without food. - The tablets are orange. - The tablets should be stored at room temperature.  - Acid reducing agents such as H2 blockers (ie. Pepcid (famotidine), Zantac (ranitidine), Tagamet (cimetidine), Axid (nizatidine) and proton pump inhibitors (ie. Prilosec (omeprazole), Protonix (pantoprazole), Nexium (esomeprazole), or Aciphex (rabeprazole)) can decrease effectiveness of Harvoni. Do not take until you have discussed with a health care provider.    -Antacids that contain magnesium and/or aluminum hydroxide (ie. Milk of Magensia, Rolaids, Gaviscon, Maalox, Mylanta, an dArthritis Pain Formula)can reduce absorption of Harvoni, so take them at least 4 hours before or after Harvoni.  -Calcium carbonate (calcium supplements or antacids such  as Tums, Caltrate, Os-Cal)needs to be taken at least 4 hours hours before or after Harvoni.  -St. John's wort or any products that contain St. John's wort like some herbal supplements  Please inform the office prior to starting any of these medications.  - The common side effects with Harvoni:      1. Fatigue      2. Headache      3. Nausea      4. Diarrhea      5. Insomnia   Support Path is a suite of resources designed to help patients start with HARVONI and move toward treatment completion Gross helps patients access therapy and get off to an efficient start  Benefits investigation and prior authorization support Co-pay and other financial assistance A specialty pharmacy finder CO-PAY COUPON The [place] co-pay coupon may help eligible patients lower their out-of-pocket costs. With a co-pay coupon, most eligible patients may pay no more than $5 per co-pay (restrictions apply) www.harvoni.com call 856-181-3180 Not valid for patients enrolled in government healthcare prescription drug programs, such as Medicare Part D and Medicaid. Patients in the coverage gap known as the "donut hole" also are not eligible The HARVONI co-pay coupon program will cover the out-of-pocket costs for HARVONI prescriptions up to a maximum of 25% of the catalog price of a 12-week regimen of HARVONI  Please note that this only lists the most common side effects and is NOT a comprehensive list of the potential side effects of these medications. For more information, please review the drug information sheets that come with your medication package from the pharmacy.  ---------------------------------------------------------------- GENERAL HELPFUL HINTS ON HCV THERAPY: 1. No alcohol. 2. Protect against sun-sensitivity/sunburns (wear sunglasses, hat,  long sleeves, pants and sunscreen). 3. Stay well-hydrated/well-moisturized. 4. Notify the [facility] of any changes in your other  over-the-counter/herbal or prescription medications. 5. If you miss a dose of your medication, take the missed dose as soon as you remember. Return to your regular time/dose schedule the next day.  6.  Do not stop taking your medications without first talking with your healthcare provider. 7.  You may take Tylenol (acetaminophen), as long as the dose is less than 2000 mg (OR no more than 4 tablets of the Tylenol Extra Strengths 500mg  tablet) in 24 hours. 8.  You will need to obtain routine labs and/or office visits at [facility] at weeks 2 and 12 as well as 12 and 24 weeks after completion of treatment.   Scharlene Gloss, [place] for [place], [place]  [phone]

## 2014-12-09 NOTE — Progress Notes (Signed)
+[name] is a 64 y.o. male who presents for initial evaluation and management of a positive Hepatitis C antibody test.  Patient tested positive this year. Hepatitis C risk factors present are: IV drug abuse (details: more than 10 years ago). Patient denies renal dialysis, sexual contact with person with liver disease, tattoos. Patient has had other studies performed. Results: hepatitis C RNA by PCR, result: positive. Patient has not had prior treatment for Hepatitis C. Patient does not have a past history of liver disease. Patient does not have a family history of liver disease.   HPI: He was evaluated by [facility] in [facility] but was unable to get back in and was not progressing to get treatment started.  Referred here now.  Was told he was F3 from [place] which he went to [facility] to do, no results available on Care Everywhere.  Has genotype 1a.    Patient does not have documented immunity to Hepatitis A. Patient does not have documented immunity to Hepatitis B.     Review of Systems A comprehensive review of systems was negative.   Past Medical History  Diagnosis Date  . Back pain   . Cocaine abuse   . Alcohol abuse   . Arthritis   . Hepatitis     hep c    Prior to Admission medications   Medication Sig Start Date End Date Taking? Authorizing Provider  acetaminophen (TYLENOL) 325 MG tablet Take by mouth.   Yes Historical Provider, MD  diphenhydrAMINE (BENADRYL) 25 mg capsule Take 1 capsule (25 mg total) by mouth every 4 (four) hours as needed for itching. 09/24/13  Yes Joshua Chadwell, PA-C  docusate sodium 100 MG CAPS Take 100 mg by mouth 2 (two) times daily. 09/24/13  Yes Joshua Chadwell, PA-C  DULoxetine (CYMBALTA) 60 MG capsule Take 1 capsule (60 mg total) by mouth daily. 01/17/14  Yes Marcial Pacas, MD  Hydrocodone-Acetaminophen (VICODIN HP) 10-300 MG TABS Take 1 tablet by mouth every 6 (six) hours as needed. 02/24/14  Yes Veryl Speak, MD  naproxen sodium (RA NAPROXEN SODIUM) 220 MG tablet  Take by mouth.   Yes Historical Provider, MD    No Known Allergies  History  Substance Use Topics  . Smoking status: Never Smoker   . Smokeless tobacco: Never Used  . Alcohol Use: 0.6 oz/week    1 Cans of beer per week     Comment: beer    No family history on file.    Objective:   Filed Vitals:   12/09/14 1011  BP: 133/90  Pulse: 73  Temp: 97.5 F (36.4 C)   in no apparent distress and alert HEENT: anicteric Cor RRR and No murmurs clear Bowel sounds are normal, liver is not enlarged, spleen is not enlarged peripheral pulses normal, no pedal edema, no clubbing or cyanosis negative for - jaundice, spider hemangioma, telangiectasia, palmar erythema, ecchymosis and atrophy  Laboratory Genotype:  Lab Results  Component Value Date   HCVGENOTYPE 1a 11/12/2014   HCV viral load:  Lab Results  Component Value Date   HCVQUANT [id number]* 11/12/2014   Lab Results  Component Value Date   WBC 11.4* 09/24/2013   HGB 10.8* 09/24/2013   HCT 32.1* 09/24/2013   MCV 93.0 09/24/2013   PLT 234 09/24/2013    Lab Results  Component Value Date   CREATININE 1.13 09/22/2013   BUN 10 09/22/2013   NA 134* 09/22/2013   K 4.5 09/22/2013   CL 95* 09/22/2013   CO2 28  09/22/2013    Lab Results  Component Value Date   ALT 77* 09/13/2013   AST 72* 09/13/2013   ALKPHOS 59 09/13/2013   BILITOT 0.3 09/13/2013   INR 1.05 11/12/2014      Assessment: Chronic Hepatitis C genotype 1a  Plan: 1) Patient counseled extensively on limiting acetaminophen to no more than 2 grams daily, avoidance of alcohol. 2) Transmission discussed with patient including sexual transmission, sharing razors and toothbrush.   3) Will need referral to gastroenterology if concern for cirrhosis 4) Will need referral for substance abuse counseling: No. 5) Will prescribe Harvoni for 12 weeks. Would like to avoid ribavirin in him due to anemia and history of needing a blood transfusion.  Zepatier is also an  option.   6) Hepatitis A vaccine No.will check antibodies.  7) Hepatitis B vaccine No. 8) Pneumovax vaccine if concern for cirrhosis 9) will follow up after starting medication 10) elastography - is F3 but do not have the record.  I will schedule which is the end of this month and if we are able to get the record ahead of time, I will cancel the elastography.   11) will need to sign readiness form.

## 2014-12-10 LAB — HEPATITIS B SURFACE ANTIBODY,QUALITATIVE: Hep B S Ab: NEGATIVE

## 2014-12-10 LAB — HEPATITIS B SURFACE ANTIGEN: HEP B S AG: NEGATIVE

## 2014-12-10 LAB — HEPATITIS A ANTIBODY, TOTAL: HEP A TOTAL AB: NONREACTIVE

## 2014-12-10 LAB — HEPATITIS B CORE ANTIBODY, TOTAL: Hep B Core Total Ab: REACTIVE — AB

## 2014-12-14 LAB — HCV RNA NS5A DRUG RESISTANCE

## 2014-12-25 ENCOUNTER — Other Ambulatory Visit: Payer: Self-pay | Admitting: Neurosurgery

## 2014-12-31 ENCOUNTER — Ambulatory Visit (HOSPITAL_COMMUNITY)
Admission: RE | Admit: 2014-12-31 | Discharge: 2014-12-31 | Disposition: A | Discharge status: Home / Self Care | Payer: Medicaid Other | Source: Ambulatory Visit | Attending: Internal Medicine | Admitting: Internal Medicine

## 2014-12-31 DIAGNOSIS — B182 Chronic viral hepatitis C: Secondary | ICD-10-CM | POA: Diagnosis not present

## 2014-12-31 DIAGNOSIS — D1803 Hemangioma of intra-abdominal structures: Secondary | ICD-10-CM | POA: Diagnosis not present

## 2015-01-13 ENCOUNTER — Other Ambulatory Visit: Payer: Self-pay | Admitting: Internal Medicine

## 2015-01-13 MED ORDER — RIBAVIRIN 200 MG PO TABS: 1200.0000 mg/d | ORAL_TABLET | Freq: Two times a day (BID) | ORAL | Status: DC

## 2015-01-13 MED ORDER — OMBITAS-PARITAPRE-RITONA-DASAB 12.5-75-50 &250 MG PO TBPK: 1.0000 | ORAL_TABLET | Freq: Two times a day (BID) | ORAL | Status: DC

## 2015-01-15 ENCOUNTER — Telehealth: Payer: Self-pay | Admitting: *Deleted

## 2015-01-15 ENCOUNTER — Other Ambulatory Visit: Payer: Self-pay | Admitting: Internal Medicine

## 2015-01-15 DIAGNOSIS — B1920 Unspecified viral hepatitis C without hepatic coma: Secondary | ICD-10-CM

## 2015-01-15 NOTE — Telephone Encounter (Signed)
Called and left patient a voicemail to call the clinic to schedule follow up lab appt in 4 weeks and visit with Dr. Linus Salmons in 5 weeks. Myrtis Hopping

## 2015-01-20 ENCOUNTER — Other Ambulatory Visit: Payer: Self-pay | Admitting: *Deleted

## 2015-01-20 DIAGNOSIS — B1710 Acute hepatitis C without hepatic coma: Secondary | ICD-10-CM

## 2015-02-06 ENCOUNTER — Other Ambulatory Visit: Payer: Medicaid Other

## 2015-02-12 ENCOUNTER — Other Ambulatory Visit: Payer: Medicaid Other

## 2015-02-24 ENCOUNTER — Other Ambulatory Visit: Payer: Medicaid Other

## 2015-02-24 ENCOUNTER — Encounter (HOSPITAL_COMMUNITY): Payer: Self-pay

## 2015-02-24 ENCOUNTER — Encounter (HOSPITAL_COMMUNITY)
Admission: RE | Admit: 2015-02-24 | Discharge: 2015-02-24 | Disposition: A | Discharge status: Home / Self Care | Payer: Medicaid Other | Source: Ambulatory Visit | Attending: Neurosurgery | Admitting: Neurosurgery

## 2015-02-24 DIAGNOSIS — Z01810 Encounter for preprocedural cardiovascular examination: Secondary | ICD-10-CM | POA: Diagnosis not present

## 2015-02-24 DIAGNOSIS — Z0183 Encounter for blood typing: Secondary | ICD-10-CM | POA: Diagnosis not present

## 2015-02-24 DIAGNOSIS — Z01812 Encounter for preprocedural laboratory examination: Secondary | ICD-10-CM | POA: Insufficient documentation

## 2015-02-24 DIAGNOSIS — B1920 Unspecified viral hepatitis C without hepatic coma: Secondary | ICD-10-CM

## 2015-02-24 HISTORY — DX: Depression, unspecified: F32.A

## 2015-02-24 HISTORY — DX: Major depressive disorder, single episode, unspecified: F32.9

## 2015-02-24 LAB — COMPREHENSIVE METABOLIC PANEL
ALBUMIN: 4.2 g/dL (ref 3.6–5.1)
ALT: 11 U/L (ref 9–46)
ALT: 14 U/L — AB (ref 17–63)
AST: 18 U/L (ref 10–35)
AST: 23 U/L (ref 15–41)
Albumin: 3.7 g/dL (ref 3.5–5.0)
Alkaline Phosphatase: 66 U/L (ref 40–115)
Alkaline Phosphatase: 68 U/L (ref 38–126)
Anion gap: 7 (ref 5–15)
BILIRUBIN TOTAL: 0.8 mg/dL (ref 0.2–1.2)
BUN: 9 mg/dL (ref 7–25)
BUN: 6 mg/dL (ref 6–20)
CHLORIDE: 102 mmol/L (ref 98–110)
CHLORIDE: 105 mmol/L (ref 101–111)
CO2: 27 mmol/L (ref 20–31)
CO2: 27 mmol/L (ref 22–32)
CREATININE: 1.07 mg/dL (ref 0.61–1.24)
Calcium: 9.6 mg/dL (ref 8.6–10.3)
Calcium: 9.7 mg/dL (ref 8.9–10.3)
Creat: 1.02 mg/dL (ref 0.70–1.25)
GFR calc Af Amer: >60 mL/min (ref >60)
GFR calc non Af Amer: >60 mL/min (ref >60)
GLUCOSE: 97 mg/dL (ref 65–99)
Glucose, Bld: 109 mg/dL — ABNORMAL HIGH (ref 65–99)
Potassium: 5 mmol/L (ref 3.5–5.3)
Potassium: 4.3 mmol/L (ref 3.5–5.1)
SODIUM: 136 mmol/L (ref 135–146)
SODIUM: 139 mmol/L (ref 135–145)
Total Bilirubin: 1.1 mg/dL (ref 0.3–1.2)
Total Protein: 7.3 g/dL (ref 6.1–8.1)
Total Protein: 7.6 g/dL (ref 6.5–8.1)

## 2015-02-24 LAB — CBC WITH DIFFERENTIAL/PLATELET
Basophils Absolute: 0 10*3/uL (ref 0.0–0.1)
Basophils Relative: 0 % (ref 0–1)
EOS ABS: 0.5 10*3/uL (ref 0.0–0.7)
Eosinophils Relative: 7 % — ABNORMAL HIGH (ref 0–5)
HEMATOCRIT: 42.4 % (ref 39.0–52.0)
Hemoglobin: 14.3 g/dL (ref 13.0–17.0)
LYMPHS ABS: 2.7 10*3/uL (ref 0.7–4.0)
Lymphocytes Relative: 39 % (ref 12–46)
MCH: 31.1 pg (ref 26.0–34.0)
MCHC: 33.7 g/dL (ref 30.0–36.0)
MCV: 92.2 fL (ref 78.0–100.0)
MPV: 9.1 fL (ref 8.6–12.4)
Monocytes Absolute: 0.4 10*3/uL (ref 0.1–1.0)
Monocytes Relative: 6 % (ref 3–12)
NEUTROS ABS: 3.4 10*3/uL (ref 1.7–7.7)
NEUTROS PCT: 48 % (ref 43–77)
PLATELETS: 248 10*3/uL (ref 150–400)
RBC: 4.6 MIL/uL (ref 4.22–5.81)
RDW: 14.1 % (ref 11.5–15.5)
WBC: 7 10*3/uL (ref 4.0–10.5)

## 2015-02-24 LAB — SURGICAL PCR SCREEN
MRSA, PCR: NEGATIVE
Staphylococcus aureus: NEGATIVE

## 2015-02-24 LAB — CBC
HCT: 44.1 % (ref 39.0–52.0)
HEMOGLOBIN: 14.6 g/dL (ref 13.0–17.0)
MCH: 30.9 pg (ref 26.0–34.0)
MCHC: 33.1 g/dL (ref 30.0–36.0)
MCV: 93.4 fL (ref 78.0–100.0)
Platelets: 246 10*3/uL (ref 150–400)
RBC: 4.72 MIL/uL (ref 4.22–5.81)
RDW: 13.4 % (ref 11.5–15.5)
WBC: 7.1 10*3/uL (ref 4.0–10.5)

## 2015-02-24 LAB — TYPE AND SCREEN
ABO/RH(D): A POS
Antibody Screen: NEGATIVE

## 2015-02-24 NOTE — Pre-Procedure Instructions (Signed)
    Joandry Slagter  02/24/2015      [id number] - [place]), [place] [address] [place] [address] Phone: [phone] Fax: [phone]  [place], [place] - [address] [place] [address] Phone: [phone] Fax: [phone]    Your procedure is scheduled on 03/05/15.  Report to [facility] Admitting at 830 A.M.  Call this number if you have problems the morning of surgery:  [phone]   Remember:  Do not eat food or drink liquids after midnight.  Take these medicines the morning of surgery with A SIP OF WATER --cymbalta,vicodan,ribavirin   Do not wear jewelry, make-up or nail polish.  Do not wear lotions, powders, or perfumes.  You may wear deodorant.  Do not shave 48 hours prior to surgery.  Men may shave face and neck.  Do not bring valuables to the hospital.  [facility] is not responsible for any belongings or valuables.  Contacts, dentures or bridgework may not be worn into surgery.  Leave your suitcase in the car.  After surgery it may be brought to your room.  For patients admitted to the hospital, discharge time will be determined by your treatment team.  Patients discharged the day of surgery will not be allowed to drive home.   Name and phone number of your driver:    Special instructions:   Please read over the following fact sheets that you were given. Pain Booklet, Coughing and Deep Breathing, MRSA Information and Surgical Site Infection Prevention

## 2015-02-25 LAB — HEPATITIS C RNA QUANTITATIVE: HCV Quantitative: NOT DETECTED IU/mL (ref <15)

## 2015-03-04 ENCOUNTER — Ambulatory Visit: Payer: Medicaid Other | Admitting: Internal Medicine

## 2015-03-05 ENCOUNTER — Inpatient Hospital Stay (HOSPITAL_COMMUNITY): Payer: Medicaid Other

## 2015-03-05 ENCOUNTER — Encounter (HOSPITAL_COMMUNITY): Payer: Self-pay | Admitting: Certified Registered"

## 2015-03-05 ENCOUNTER — Encounter (HOSPITAL_COMMUNITY)
Admission: RE | Disposition: A | Discharge status: Skilled Nursing Facility | Payer: Self-pay | Source: Ambulatory Visit | Attending: Neurosurgery

## 2015-03-05 ENCOUNTER — Inpatient Hospital Stay (HOSPITAL_COMMUNITY): Payer: Medicaid Other | Admitting: Anesthesiology

## 2015-03-05 ENCOUNTER — Inpatient Hospital Stay (HOSPITAL_COMMUNITY)
Admission: RE | Admit: 2015-03-05 | Discharge: 2015-03-11 | DRG: 460 | Disposition: A | Discharge status: Skilled Nursing Facility | Payer: Medicaid Other | Source: Ambulatory Visit | Attending: Neurosurgery | Admitting: Neurosurgery

## 2015-03-05 DIAGNOSIS — M549 Dorsalgia, unspecified: Secondary | ICD-10-CM | POA: Diagnosis present

## 2015-03-05 DIAGNOSIS — Z79891 Long term (current) use of opiate analgesic: Secondary | ICD-10-CM | POA: Diagnosis not present

## 2015-03-05 DIAGNOSIS — Z419 Encounter for procedure for purposes other than remedying health state, unspecified: Secondary | ICD-10-CM

## 2015-03-05 DIAGNOSIS — M5116 Intervertebral disc disorders with radiculopathy, lumbar region: Secondary | ICD-10-CM | POA: Diagnosis present

## 2015-03-05 DIAGNOSIS — R339 Retention of urine, unspecified: Secondary | ICD-10-CM | POA: Diagnosis not present

## 2015-03-05 DIAGNOSIS — Z23 Encounter for immunization: Secondary | ICD-10-CM

## 2015-03-05 DIAGNOSIS — Z791 Long term (current) use of non-steroidal anti-inflammatories (NSAID): Secondary | ICD-10-CM | POA: Diagnosis not present

## 2015-03-05 DIAGNOSIS — M5136 Other intervertebral disc degeneration, lumbar region: Secondary | ICD-10-CM | POA: Diagnosis present

## 2015-03-05 DIAGNOSIS — Z79899 Other long term (current) drug therapy: Secondary | ICD-10-CM | POA: Diagnosis not present

## 2015-03-05 DIAGNOSIS — M51369 Other intervertebral disc degeneration, lumbar region without mention of lumbar back pain or lower extremity pain: Secondary | ICD-10-CM | POA: Diagnosis present

## 2015-03-05 DIAGNOSIS — M5127 Other intervertebral disc displacement, lumbosacral region: Secondary | ICD-10-CM | POA: Diagnosis present

## 2015-03-05 HISTORY — PX: POSTERIOR LUMBAR FUSION: SHX6036

## 2015-03-05 SURGERY — POSTERIOR LUMBAR FUSION 2 LEVEL
Anesthesia: General | Site: Spine Lumbar

## 2015-03-05 MED ORDER — ARTIFICIAL TEARS OP OINT
TOPICAL_OINTMENT | OPHTHALMIC | Status: AC
  Filled 2015-03-05: qty 3.5

## 2015-03-05 MED ORDER — SODIUM CHLORIDE 0.9 % IV SOLN
INTRAVENOUS | Status: DC | PRN
  Administered 2015-03-05: 17:00:00 via INTRAVENOUS

## 2015-03-05 MED ORDER — CEFAZOLIN SODIUM-DEXTROSE 2-3 GM-% IV SOLR
INTRAVENOUS | Status: AC
  Filled 2015-03-05: qty 50

## 2015-03-05 MED ORDER — OXYCODONE-ACETAMINOPHEN 5-325 MG PO TABS
1.0000 | ORAL_TABLET | ORAL | Status: DC | PRN
  Administered 2015-03-06: 2 via ORAL
  Administered 2015-03-06: 1 via ORAL
  Administered 2015-03-06 – 2015-03-11 (×14): 2 via ORAL
  Filled 2015-03-05 (×16): qty 2

## 2015-03-05 MED ORDER — DIPHENHYDRAMINE HCL 25 MG PO CAPS
25.0000 mg | ORAL_CAPSULE | ORAL | Status: DC | PRN
  Administered 2015-03-05 – 2015-03-08 (×2): 25 mg via ORAL
  Filled 2015-03-05 (×2): qty 1

## 2015-03-05 MED ORDER — FENTANYL CITRATE (PF) 250 MCG/5ML IJ SOLN
INTRAMUSCULAR | Status: AC
  Filled 2015-03-05: qty 5

## 2015-03-05 MED ORDER — OXYCODONE HCL 5 MG/5ML PO SOLN: 5.0000 mg | Freq: Once | ORAL | Status: DC | PRN

## 2015-03-05 MED ORDER — CEFAZOLIN SODIUM-DEXTROSE 2-3 GM-% IV SOLR
2.0000 g | INTRAVENOUS | Status: AC
  Administered 2015-03-05 (×2): 2 g via INTRAVENOUS

## 2015-03-05 MED ORDER — PROPOFOL 10 MG/ML IV BOLUS
INTRAVENOUS | Status: AC
  Filled 2015-03-05: qty 20

## 2015-03-05 MED ORDER — PHENOL 1.4 % MT LIQD: 1.0000 | OROMUCOSAL | Status: DC | PRN

## 2015-03-05 MED ORDER — THROMBIN 20000 UNITS EX SOLR
CUTANEOUS | Status: DC | PRN
  Administered 2015-03-05: 12:00:00 via TOPICAL

## 2015-03-05 MED ORDER — PROPOFOL 10 MG/ML IV BOLUS
INTRAVENOUS | Status: DC | PRN
  Administered 2015-03-05: 100 mg via INTRAVENOUS
  Administered 2015-03-05: 50 mg via INTRAVENOUS

## 2015-03-05 MED ORDER — CEFAZOLIN SODIUM-DEXTROSE 2-3 GM-% IV SOLR
2.0000 g | Freq: Three times a day (TID) | INTRAVENOUS | Status: AC
  Administered 2015-03-05 – 2015-03-06 (×2): 2 g via INTRAVENOUS
  Filled 2015-03-05 (×2): qty 50

## 2015-03-05 MED ORDER — RIBAVIRIN 200 MG PO TABS
1200.0000 mg/d | ORAL_TABLET | Freq: Two times a day (BID) | ORAL | Status: DC
  Administered 2015-03-06 – 2015-03-11 (×11): 600 mg via ORAL
  Filled 2015-03-05 (×6): qty 3

## 2015-03-05 MED ORDER — VANCOMYCIN HCL 1000 MG IV SOLR
INTRAVENOUS | Status: AC
  Filled 2015-03-05: qty 1000

## 2015-03-05 MED ORDER — ONDANSETRON HCL 4 MG/2ML IJ SOLN
4.0000 mg | INTRAMUSCULAR | Status: DC | PRN
  Administered 2015-03-08 – 2015-03-09 (×3): 4 mg via INTRAVENOUS
  Filled 2015-03-05 (×3): qty 2

## 2015-03-05 MED ORDER — OMBITAS-PARITAPRE-RITONA-DASAB 12.5-75-50 &250 MG PO TBPK
1.0000 | ORAL_TABLET | Freq: Two times a day (BID) | ORAL | Status: DC
  Administered 2015-03-06 – 2015-03-11 (×11): 1 via ORAL
  Filled 2015-03-05: qty 1

## 2015-03-05 MED ORDER — LACTATED RINGERS IV SOLN
INTRAVENOUS | Status: DC
  Administered 2015-03-05: 21:00:00 via INTRAVENOUS

## 2015-03-05 MED ORDER — ONDANSETRON HCL 4 MG/2ML IJ SOLN
INTRAMUSCULAR | Status: AC
  Filled 2015-03-05: qty 2

## 2015-03-05 MED ORDER — HYDROCODONE-ACETAMINOPHEN 5-325 MG PO TABS
1.0000 | ORAL_TABLET | ORAL | Status: DC | PRN
  Administered 2015-03-06: 2 via ORAL
  Administered 2015-03-07: 1 via ORAL
  Administered 2015-03-08: 2 via ORAL
  Filled 2015-03-05 (×2): qty 2
  Filled 2015-03-05: qty 1

## 2015-03-05 MED ORDER — DIAZEPAM 5 MG PO TABS
5.0000 mg | ORAL_TABLET | Freq: Four times a day (QID) | ORAL | Status: DC | PRN
  Administered 2015-03-05 – 2015-03-08 (×6): 5 mg via ORAL
  Filled 2015-03-05 (×5): qty 1

## 2015-03-05 MED ORDER — FENTANYL CITRATE (PF) 100 MCG/2ML IJ SOLN
INTRAMUSCULAR | Status: DC | PRN
  Administered 2015-03-05: 50 ug via INTRAVENOUS
  Administered 2015-03-05: 150 ug via INTRAVENOUS
  Administered 2015-03-05: 100 ug via INTRAVENOUS
  Administered 2015-03-05: 150 ug via INTRAVENOUS
  Administered 2015-03-05: 50 ug via INTRAVENOUS
  Administered 2015-03-05: 100 ug via INTRAVENOUS
  Administered 2015-03-05: 50 ug via INTRAVENOUS
  Administered 2015-03-05: 100 ug via INTRAVENOUS

## 2015-03-05 MED ORDER — MIDAZOLAM HCL 5 MG/5ML IJ SOLN
INTRAMUSCULAR | Status: DC | PRN
  Administered 2015-03-05: 2 mg via INTRAVENOUS

## 2015-03-05 MED ORDER — ROCURONIUM BROMIDE 100 MG/10ML IV SOLN
INTRAVENOUS | Status: DC | PRN
  Administered 2015-03-05 (×2): 10 mg via INTRAVENOUS
  Administered 2015-03-05: 20 mg via INTRAVENOUS
  Administered 2015-03-05: 10 mg via INTRAVENOUS
  Administered 2015-03-05: 40 mg via INTRAVENOUS

## 2015-03-05 MED ORDER — HYDROMORPHONE HCL 1 MG/ML IJ SOLN
0.2500 mg | INTRAMUSCULAR | Status: DC | PRN
  Administered 2015-03-05: 0.5 mg via INTRAVENOUS

## 2015-03-05 MED ORDER — LACTATED RINGERS IV SOLN
INTRAVENOUS | Status: DC
  Administered 2015-03-05 (×3): via INTRAVENOUS

## 2015-03-05 MED ORDER — DOCUSATE SODIUM 100 MG PO CAPS
100.0000 mg | ORAL_CAPSULE | Freq: Two times a day (BID) | ORAL | Status: DC
  Administered 2015-03-05 – 2015-03-11 (×12): 100 mg via ORAL
  Filled 2015-03-05 (×12): qty 1

## 2015-03-05 MED ORDER — LIDOCAINE HCL (CARDIAC) 20 MG/ML IV SOLN
INTRAVENOUS | Status: AC
  Filled 2015-03-05: qty 5

## 2015-03-05 MED ORDER — DULOXETINE HCL 60 MG PO CPEP
60.0000 mg | ORAL_CAPSULE | Freq: Every day | ORAL | Status: DC
  Administered 2015-03-06 – 2015-03-11 (×7): 60 mg via ORAL
  Filled 2015-03-05 (×7): qty 1

## 2015-03-05 MED ORDER — ROCURONIUM BROMIDE 50 MG/5ML IV SOLN
INTRAVENOUS | Status: AC
  Filled 2015-03-05: qty 1

## 2015-03-05 MED ORDER — VANCOMYCIN HCL 1000 MG IV SOLR
INTRAVENOUS | Status: DC | PRN
  Administered 2015-03-05: 1000 mg via TOPICAL

## 2015-03-05 MED ORDER — ONDANSETRON HCL 4 MG/2ML IJ SOLN
INTRAMUSCULAR | Status: DC | PRN
  Administered 2015-03-05: 4 mg via INTRAVENOUS

## 2015-03-05 MED ORDER — MORPHINE SULFATE 2 MG/ML IJ SOLN
1.0000 mg | INTRAMUSCULAR | Status: DC | PRN
  Administered 2015-03-05: 2 mg via INTRAVENOUS
  Filled 2015-03-05 (×2): qty 1

## 2015-03-05 MED ORDER — SODIUM CHLORIDE 0.9 % IR SOLN
Status: DC | PRN
  Administered 2015-03-05 (×2)

## 2015-03-05 MED ORDER — ACETAMINOPHEN 325 MG PO TABS: 650.0000 mg | ORAL_TABLET | ORAL | Status: DC | PRN

## 2015-03-05 MED ORDER — MENTHOL 3 MG MT LOZG: 1.0000 | LOZENGE | OROMUCOSAL | Status: DC | PRN

## 2015-03-05 MED ORDER — BUPIVACAINE LIPOSOME 1.3 % IJ SUSP
20.0000 mL | INTRAMUSCULAR | Status: AC
  Administered 2015-03-05: 20 mL
  Filled 2015-03-05: qty 20

## 2015-03-05 MED ORDER — LIDOCAINE HCL (CARDIAC) 20 MG/ML IV SOLN
INTRAVENOUS | Status: DC | PRN
  Administered 2015-03-05: 100 mg via INTRAVENOUS

## 2015-03-05 MED ORDER — ALUM & MAG HYDROXIDE-SIMETH 200-200-20 MG/5ML PO SUSP: 30.0000 mL | Freq: Four times a day (QID) | ORAL | Status: DC | PRN

## 2015-03-05 MED ORDER — BUPIVACAINE-EPINEPHRINE (PF) 0.5% -1:200000 IJ SOLN
INTRAMUSCULAR | Status: DC | PRN
  Administered 2015-03-05: 10 mL

## 2015-03-05 MED ORDER — BISACODYL 10 MG RE SUPP
10.0000 mg | Freq: Every day | RECTAL | Status: DC | PRN
  Administered 2015-03-11: 10 mg via RECTAL
  Filled 2015-03-05: qty 1

## 2015-03-05 MED ORDER — 0.9 % SODIUM CHLORIDE (POUR BTL) OPTIME
TOPICAL | Status: DC | PRN
  Administered 2015-03-05: 1000 mL

## 2015-03-05 MED ORDER — ARTIFICIAL TEARS OP OINT
TOPICAL_OINTMENT | OPHTHALMIC | Status: DC | PRN
Start: 2015-03-05 — End: 2015-03-05
  Administered 2015-03-05: 1 via OPHTHALMIC

## 2015-03-05 MED ORDER — MIDAZOLAM HCL 2 MG/2ML IJ SOLN
INTRAMUSCULAR | Status: AC
  Filled 2015-03-05: qty 4

## 2015-03-05 MED ORDER — OXYCODONE HCL 5 MG PO TABS: 10.0000 mg | ORAL_TABLET | Freq: Once | ORAL | Status: DC | PRN

## 2015-03-05 MED ORDER — OXYCODONE HCL 5 MG PO TABS
ORAL_TABLET | ORAL | Status: AC
  Filled 2015-03-05: qty 2

## 2015-03-05 MED ORDER — PROMETHAZINE HCL 25 MG/ML IJ SOLN: 6.2500 mg | INTRAMUSCULAR | Status: DC | PRN

## 2015-03-05 MED ORDER — DIAZEPAM 5 MG PO TABS
ORAL_TABLET | ORAL | Status: AC
  Administered 2015-03-05: 5 mg via ORAL
  Filled 2015-03-05: qty 1

## 2015-03-05 MED ORDER — HYDROMORPHONE HCL 1 MG/ML IJ SOLN
INTRAMUSCULAR | Status: AC
  Administered 2015-03-05: 0.5 mg via INTRAVENOUS
  Filled 2015-03-05: qty 1

## 2015-03-05 MED ORDER — ACETAMINOPHEN 650 MG RE SUPP: 650.0000 mg | RECTAL | Status: DC | PRN

## 2015-03-05 SURGICAL SUPPLY — 68 items
BAG DECANTER FOR FLEXI CONT (MISCELLANEOUS) ×3 IMPLANT
BENZOIN TINCTURE PRP APPL 2/3 (GAUZE/BANDAGES/DRESSINGS) ×3 IMPLANT
BLADE CLIPPER SURG (BLADE) IMPLANT
BRUSH SCRUB EZ PLAIN DRY (MISCELLANEOUS) ×3 IMPLANT
BUR MATCHSTICK NEURO 3.0 LAGG (BURR) ×3 IMPLANT
BUR PRECISION FLUTE 6.0 (BURR) ×3 IMPLANT
CAGE ALTERA 10X31X9-13 15D (Cage) ×2 IMPLANT
CAGE ALTERA 9-13-15-31MM (Cage) ×1 IMPLANT
CANISTER SUCT 3000ML PPV (MISCELLANEOUS) ×3 IMPLANT
CAP REVERE LOCKING (Cap) ×18 IMPLANT
CLOSURE WOUND 1/2 X4 (GAUZE/BANDAGES/DRESSINGS) ×1
CONN CROSSLINK REV 6.35 48-60 (Connector) ×3 IMPLANT
CONNECTOR CRSLNK REV6.35 48-60 (Connector) ×1 IMPLANT
CONT SPEC 4OZ CLIKSEAL STRL BL (MISCELLANEOUS) ×3 IMPLANT
COVER BACK TABLE 60X90IN (DRAPES) ×3 IMPLANT
DRAPE C-ARM 42X72 X-RAY (DRAPES) ×6 IMPLANT
DRAPE LAPAROTOMY 100X72X124 (DRAPES) ×3 IMPLANT
DRAPE POUCH INSTRU U-SHP 10X18 (DRAPES) ×3 IMPLANT
DRAPE PROXIMA HALF (DRAPES) ×3 IMPLANT
DRAPE SURG 17X23 STRL (DRAPES) ×12 IMPLANT
ELECT BLADE 4.0 EZ CLEAN MEGAD (MISCELLANEOUS) ×3
ELECT REM PT RETURN 9FT ADLT (ELECTROSURGICAL) ×3
ELECTRODE BLDE 4.0 EZ CLN MEGD (MISCELLANEOUS) ×1 IMPLANT
ELECTRODE REM PT RTRN 9FT ADLT (ELECTROSURGICAL) ×1 IMPLANT
EVACUATOR 1/8 PVC DRAIN (DRAIN) ×3 IMPLANT
GAUZE SPONGE 4X4 12PLY STRL (GAUZE/BANDAGES/DRESSINGS) ×3 IMPLANT
GAUZE SPONGE 4X4 16PLY XRAY LF (GAUZE/BANDAGES/DRESSINGS) ×3 IMPLANT
GLOVE BIO SURGEON STRL SZ7 (GLOVE) ×9 IMPLANT
GLOVE BIO SURGEON STRL SZ7.5 (GLOVE) ×9 IMPLANT
GLOVE BIO SURGEON STRL SZ8 (GLOVE) ×6 IMPLANT
GLOVE BIO SURGEON STRL SZ8.5 (GLOVE) ×9 IMPLANT
GLOVE BIOGEL PI IND STRL 7.0 (GLOVE) ×3 IMPLANT
GLOVE BIOGEL PI INDICATOR 7.0 (GLOVE) ×6
GLOVE ECLIPSE 6.5 STRL STRAW (GLOVE) ×6 IMPLANT
GLOVE EXAM NITRILE LRG STRL (GLOVE) IMPLANT
GLOVE EXAM NITRILE MD LF STRL (GLOVE) IMPLANT
GLOVE EXAM NITRILE XL STR (GLOVE) IMPLANT
GLOVE EXAM NITRILE XS STR PU (GLOVE) IMPLANT
GOWN STRL REUS W/ TWL LRG LVL3 (GOWN DISPOSABLE) ×4 IMPLANT
GOWN STRL REUS W/ TWL XL LVL3 (GOWN DISPOSABLE) ×2 IMPLANT
GOWN STRL REUS W/TWL 2XL LVL3 (GOWN DISPOSABLE) IMPLANT
GOWN STRL REUS W/TWL LRG LVL3 (GOWN DISPOSABLE) ×8
GOWN STRL REUS W/TWL XL LVL3 (GOWN DISPOSABLE) ×4
KIT BASIN OR (CUSTOM PROCEDURE TRAY) ×3 IMPLANT
KIT ROOM TURNOVER OR (KITS) ×3 IMPLANT
NEEDLE HYPO 21X1.5 SAFETY (NEEDLE) IMPLANT
NEEDLE HYPO 22GX1.5 SAFETY (NEEDLE) ×3 IMPLANT
NS IRRIG 1000ML POUR BTL (IV SOLUTION) ×3 IMPLANT
PACK LAMINECTOMY NEURO (CUSTOM PROCEDURE TRAY) ×3 IMPLANT
PAD ARMBOARD 7.5X6 YLW CONV (MISCELLANEOUS) ×9 IMPLANT
PATTIES SURGICAL .5 X1 (DISPOSABLE) IMPLANT
ROD REVERE 7.5MM (Rod) ×6 IMPLANT
SCREW 7.5X50MM (Screw) ×12 IMPLANT
SPACER ALTERA 10X31 10-14-8 (Spacer) ×3 IMPLANT
SPONGE LAP 4X18 X RAY DECT (DISPOSABLE) IMPLANT
SPONGE NEURO XRAY DETECT 1X3 (DISPOSABLE) IMPLANT
SPONGE SURGIFOAM ABS GEL 100 (HEMOSTASIS) ×3 IMPLANT
STRIP BIOACTIVE 20CC 25X100X8 (Miscellaneous) ×6 IMPLANT
STRIP CLOSURE SKIN 1/2X4 (GAUZE/BANDAGES/DRESSINGS) ×2 IMPLANT
SUT VIC AB 1 CT1 18XBRD ANBCTR (SUTURE) ×2 IMPLANT
SUT VIC AB 1 CT1 8-18 (SUTURE) ×4
SUT VIC AB 2-0 CP2 18 (SUTURE) ×6 IMPLANT
SYR 20ML ECCENTRIC (SYRINGE) ×3 IMPLANT
TAPE CLOTH SURG 4X10 WHT LF (GAUZE/BANDAGES/DRESSINGS) ×3 IMPLANT
TOWEL OR 17X24 6PK STRL BLUE (TOWEL DISPOSABLE) ×3 IMPLANT
TOWEL OR 17X26 10 PK STRL BLUE (TOWEL DISPOSABLE) ×3 IMPLANT
TRAY FOLEY W/METER SILVER 16FR (SET/KITS/TRAYS/PACK) ×3 IMPLANT
WATER STERILE IRR 1000ML POUR (IV SOLUTION) ×3 IMPLANT

## 2015-03-05 NOTE — H&P (Signed)
Subjective: The patient is a 64 year old black male who has had chronic back pain. He has failed extensive nonsurgical management. He was worked up with a lumbar discogram which demonstrated concordant pain at L4-5 and L5-S1. I discussed the various treatment options with the patient including surgery. He has decided proceed with a lumbar decompression and fusion.   Past Medical History  Diagnosis Date  . Back pain   . Cocaine abuse   . Alcohol abuse   . Arthritis   . Hepatitis     hep c  . Depression   . Anxiety     Past Surgical History  Procedure Laterality Date  . Appendectomy    . Lung surgery    . Neck surgery    . Total knee arthroplasty Left 09/21/2013    Procedure: LEFT TOTAL KNEE ARTHROPLASTY;  Surgeon: Yvette Rack., MD;  Location: [facility];  Service: Orthopedics;  Laterality: Left;    No Known Allergies  History  Substance Use Topics  . Smoking status: Never Smoker   . Smokeless tobacco: Never Used  . Alcohol Use: 0.6 oz/week    1 Cans of beer per week     Comment: beer    History reviewed. No pertinent family history. Prior to Admission medications   Medication Sig Start Date End Date Taking? Authorizing Provider  Hydrocodone-Acetaminophen (VICODIN HP) 10-300 MG TABS Take 1 tablet by mouth every 6 (six) hours as needed. Patient taking differently: Take 1 tablet by mouth every 6 (six) hours as needed (pain).  02/24/14  Yes Veryl Speak, MD  naproxen sodium (RA NAPROXEN SODIUM) 220 MG tablet Take 440 mg by mouth 2 (two) times daily as needed (pain).    Yes Historical Provider, MD  [place]itas-Paritapre-Ritona-Dasab (VIEKIRA PAK) 12.5-75-50 &250 MG TBPK Take 1 packet by mouth 2 (two) times daily. 01/13/15  Yes Thayer Headings, MD  ribavirin (COPEGUS) 200 MG tablet Take 3 tablets (600 mg total) by mouth 2 (two) times daily. 01/13/15  Yes Thayer Headings, MD  diphenhydrAMINE (BENADRYL) 25 mg capsule Take 1 capsule (25 mg total) by mouth every 4 (four) hours as needed for  itching. Patient not taking: Reported on 02/21/2015 09/24/13   Chriss Czar, PA-C  docusate sodium 100 MG CAPS Take 100 mg by mouth 2 (two) times daily. Patient not taking: Reported on 02/21/2015 09/24/13   Chriss Czar, PA-C  DULoxetine (CYMBALTA) 60 MG capsule Take 1 capsule (60 mg total) by mouth daily. Patient not taking: Reported on 02/21/2015 01/17/14   Marcial Pacas, MD     Review of Systems  Positive ROS: As above  All other systems have been reviewed and were otherwise negative with the exception of those mentioned in the HPI and as above.  Objective: Vital signs in last 24 hours: Temp:  [97 F (36.1 C)] 97 F (36.1 C) (08/03 0819) Pulse Rate:  [73] 73 (08/03 0819) Resp:  [20] 20 (08/03 0819) BP: (135)/(73) 135/73 mmHg (08/03 0822) SpO2:  [99 %] 99 % (08/03 0819) Weight:  [96.616 kg (213 lb)] 96.616 kg (213 lb) (08/03 0819)  General Appearance: Alert, cooperative, no distress, Head: Normocephalic, without obvious abnormality, atraumatic Eyes: PERRL, conjunctiva/corneas clear, EOM's intact,    Ears: Normal  Throat: Normal  Neck: Supple, symmetrical, trachea midline, no adenopathy; thyroid: No enlargement/tenderness/nodules; no carotid bruit or JVD Back: Symmetric, no curvature, ROM normal, no CVA tenderness Lungs: Clear to auscultation bilaterally, respirations unlabored Heart: Regular rate and rhythm, no murmur, rub or gallop Abdomen:  Soft, non-tender,, no masses, no organomegaly Extremities: Extremities normal, atraumatic, no cyanosis or edema Pulses: 2+ and symmetric all extremities Skin: Skin color, texture, turgor normal, no rashes or lesions  NEUROLOGIC:   Mental status: alert and oriented, no aphasia, good attention span, Fund of knowledge/ memory ok Motor Exam - grossly normal Sensory Exam - grossly normal Reflexes:  Coordination - grossly normal Gait - grossly normal Balance - grossly normal Cranial Nerves: I: smell Not tested  II: visual acuity  OS:  Normal  OD: Normal   II: visual fields Full to confrontation  II: pupils Equal, round, reactive to light  III,VII: ptosis None  III,IV,VI: extraocular muscles  Full ROM  V: mastication Normal  V: facial light touch sensation  Normal  V,VII: corneal reflex  Present  VII: facial muscle function - upper  Normal  VII: facial muscle function - lower Normal  VIII: hearing Not tested  IX: soft palate elevation  Normal  IX,X: gag reflex Present  XI: trapezius strength  5/5  XI: sternocleidomastoid strength 5/5  XI: neck flexion strength  5/5  XII: tongue strength  Normal    Data Review Lab Results  Component Value Date   WBC 7.1 02/24/2015   HGB 14.6 02/24/2015   HCT 44.1 02/24/2015   MCV 93.4 02/24/2015   PLT 246 02/24/2015   Lab Results  Component Value Date   NA 139 02/24/2015   K 4.3 02/24/2015   CL 105 02/24/2015   CO2 27 02/24/2015   BUN 6 02/24/2015   CREATININE 1.07 02/24/2015   GLUCOSE 109* 02/24/2015   Lab Results  Component Value Date   INR 1.05 11/12/2014    Assessment/Plan: 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 We have discussed the various treatment options including surgery. I have described the surgical treatment option and L4-5 decompression, instrumentation, and fusion. I have shown him surgical models. We have discussed the risks, benefits, alternatives, and likelihood of achieving our goals with surgery. I have answered all the patient's questions. He has decided proceed with surgery.   Newman Pies D 03/05/2015 12:41 PM

## 2015-03-05 NOTE — Anesthesia Preprocedure Evaluation (Addendum)
Anesthesia Evaluation  Patient identified by MRN, date of birth, ID band Patient awake    Reviewed: Allergy & Precautions, H&P , NPO status , Patient's Chart, lab work & pertinent test results  Airway Mallampati: I   Neck ROM: Full  Mouth opening: Limited Mouth Opening  Dental  (+) Partial Lower, Partial Upper, Dental Advisory Given   Pulmonary neg pulmonary ROS,    Pulmonary exam normal       Cardiovascular negative cardio ROS Normal cardiovascular exam    Neuro/Psych    GI/Hepatic (+)     substance abuse  alcohol use, Hepatitis -, C  Endo/Other    Renal/GU      Musculoskeletal  (+) Arthritis -,   Abdominal   Peds  Hematology   Anesthesia Other Findings 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     Plan Discussed with: CRNA, Anesthesiologist and Surgeon  Anesthesia Plan Comments: (Takes norco 1 every 6 hours)       Anesthesia Quick Evaluation

## 2015-03-05 NOTE — Progress Notes (Signed)
Patient ID: [name], male   DOB: 08/19/1950, 64 y.o.   MRN: [id number] Subjective:  The patient is somnolent but arousable. He is in no apparent distress.  Objective: Vital signs in last 24 hours: Temp:  [97 F (36.1 C)] 97 F (36.1 C) (08/03 0819) Pulse Rate:  [73] 73 (08/03 0819) Resp:  [20] 20 (08/03 0819) BP: (135)/(73) 135/73 mmHg (08/03 0822) SpO2:  [99 %] 99 % (08/03 0819) Weight:  [96.616 kg (213 lb)] 96.616 kg (213 lb) (08/03 0819)  Intake/Output from previous day:   Intake/Output this shift: Total I/O In: 2320 [I.V.:2200; Blood:120] Out: 1675 [Urine:1275; Blood:400]  Physical exam the patient is somnolent but arousable. He is moving his lower extremities.  Lab Results: No results for input(s): WBC, HGB, HCT, PLT in the last 72 hours. BMET No results for input(s): NA, K, CL, CO2, GLUCOSE, BUN, CREATININE, CALCIUM in the last 72 hours.  Studies/Results: Dg Lumbar Spine Complete  03/05/2015   CLINICAL DATA:  lower lumbar fusion. L4 through S1 posterior lumbar interbody fusion.  EXAM: DG C-ARM 61-120 MIN; LUMBAR SPINE - COMPLETE 4+ VIEW  COMPARISON:  None.  FINDINGS: AP and lateral intraoperative fluoroscopic radiographs demonstrate placement of pedicle screws at L4, L5 and S1. Discectomy with interbody bone graft. Soft tissue retractors are present dorsally. Numbering is correlated with prior lumbar spine MRI 09/19/2014.  IMPRESSION: L4 through S1 discectomy and pedicle screw placement.   Electronically Signed   By: Dereck Ligas M.D.   On: 03/05/2015 17:51   Dg C-arm 61-120 Min  03/05/2015   CLINICAL DATA:  lower lumbar fusion. L4 through S1 posterior lumbar interbody fusion.  EXAM: DG C-ARM 61-120 MIN; LUMBAR SPINE - COMPLETE 4+ VIEW  COMPARISON:  None.  FINDINGS: AP and lateral intraoperative fluoroscopic radiographs demonstrate placement of pedicle screws at L4, L5 and S1. Discectomy with interbody bone graft. Soft tissue retractors are present dorsally. Numbering is  correlated with prior lumbar spine MRI 09/19/2014.  IMPRESSION: L4 through S1 discectomy and pedicle screw placement.   Electronically Signed   By: Dereck Ligas M.D.   On: 03/05/2015 17:51    Assessment/Plan: The patient is doing well.  LOS: 0 days     [name] D 03/05/2015, 6:33 PM

## 2015-03-05 NOTE — Op Note (Signed)
Brief history: The patient is a 64 year old black male who has had chronic back pain. He has failed extensive nonsurgical management. He was worked up with a lumbar MRI and a lumbar discogram which demonstrated disc degeneration and concordant pain at L4-5 and L5-S1. I discussed the various treatment options with the patient including surgery. He has weighed the risks, benefits, and alternative surgery and signed proceed with an L4-5 and L5-S1 decompression, instrumentation, and fusion.  Preoperative diagnosis: L4-5 and L5-S1 Degenerative disc disease, spinal stenosis compressing the L4, L5 and S1 nerve roots; lumbago; lumbar radiculopathy  Postoperative diagnosis: The same  Procedure: L5 laminectomy with bilateral L4 Laminotomy/foraminotomies to decompress the bilateral L4, L5 and S1 nerve roots(the work required to do this was in addition to the work required to do the posterior lumbar interbody fusion because of the patient's spinal stenosis, facet arthropathy. Etc. requiring a wide decompression of the nerve roots.); L4-5 and L5-S1 transforaminal posterior lumbar interbody fusion with local morselized autograft bone and Kinnex graft extender; insertion of interbody prosthesis at L4-5 and L5-S1 (globus peek expandable interbody prosthesis); posterior segmental instrumentation from L4 to S1 with globus titanium pedicle screws and rods; posterior lateral arthrodesis at L4-5 and L5-S1 with local morselized autograft bone and Kinnex bone graft extender.  Surgeon: Dr. Earle Gell  Asst.: Dr. Dayton Bailiff  Anesthesia: Gen. endotracheal  Estimated blood loss: 300 mL  Drains: One medium Hemovac  Complications: None  Description of procedure: The patient was brought to the operating room by the anesthesia team. General endotracheal anesthesia was induced. The patient was turned to the prone position on the Wilson frame. The patient's lumbosacral region was then prepared with Betadine scrub and Betadine  solution. Sterile drapes were applied.  I then injected the area to be incised with Marcaine with epinephrine solution. I then used the scalpel to make a linear midline incision over the L4-5 and L5-S1 interspace. I then used electrocautery to perform a bilateral subperiosteal dissection exposing the spinous process and lamina of L4, L5 and the upper sacrum. We then obtained intraoperative radiograph to confirm our location. We then inserted the Verstrac retractor to provide exposure.  I began the decompression by using the high speed drill to perform laminotomies at L4 and L5 bilaterally. We then used the Kerrison punches to complete the L5 laminectomy and to widen the laminotomy and removed the ligamentum flavum at L4-5 and L5-S1. We used the Kerrison punches to remove the medial facets at L4-5 and L5-S1. We performed wide foraminotomies about the bilateral L4, L5 and the upper S1 nerve roots completing the decompression. We did encounter a cystic disc herniation centrally into the left L5-S1. We removed it with a pituitary forceps decompressing the thecal sac.  We now turned our attention to the posterior lumbar interbody fusion. I used a scalpel to incise the intervertebral disc at L4-5 and L5-S1 bilaterally. I then performed a partial intervertebral discectomy at L4-5 and L5-S1 bilaterally using the pituitary forceps. We prepared the vertebral endplates at L8-9 and Q1-J9 bilaterally for the fusion by removing the soft tissues with the curettes. We then used the trial spacers to pick the appropriate sized interbody prosthesis. We prefilled his prosthesis with a combination of local morselized autograft bone that we obtained during the decompression as well as Kinnex bone graft extender. We inserted the prefilled prosthesis into the interspace at L4-5 and L5-S1, we expanded the prosthesis. There was a good snug fit of the prosthesis in the interspace. We then filled  and the remainder of the intervertebral  disc space with local morselized autograft bone and Kinnex. This completed the posterior lumbar interbody arthrodesis.  We now turned attention to the instrumentation. Under fluoroscopic guidance we cannulated the bilateral L4, L5 and S1 pedicles with the bone probe. We then removed the bone probe. We then tapped the pedicle with a 6.5 millimeter tap. We then removed the tap. We probed inside the tapped pedicle with a ball probe to rule out cortical breaches. We then inserted a 7.5 x 50 and 45 millimeter pedicle screw into the L4, L5 and S1 pedicles bilaterally under fluoroscopic guidance. We then palpated along the medial aspect of the pedicles to rule out cortical breaches. There were none. The nerve roots were not injured. We then connected the unilateral pedicle screws with a lordotic rod. We compressed the construct and secured the rod in place with the caps. We then tightened the caps appropriately. We placed a cross connector between the rods. This completed the instrumentation from L4-S1.  We now turned our attention to the posterior lateral arthrodesis at L4-5 and L5-S1. We used the high-speed drill to decorticate the remainder of the facets, pars, transverse process at L4-5 and L5-S1. We then applied a combination of local morselized autograft bone and Kinnex bone graft extender over these decorticated posterior lateral structures. This completed the posterior lateral arthrodesis.  We then obtained hemostasis using bipolar electrocautery. We irrigated the wound out with bacitracin solution. We inspected the thecal sac and nerve roots and noted they were well decompressed. We then removed the retractor. We placed vancomycin powder in the wound. We placed a medium Hemovac drain in the epidural space and tunneled out through separate stab wound. We reapproximated patient's thoracolumbar fascia with interrupted #1 Vicryl suture. We reapproximated patient's subcutaneous tissue with interrupted 2-0 Vicryl  suture. The reapproximated patient's skin with Steri-Strips and benzoin. The wound was then coated with bacitracin ointment. A sterile dressing was applied. The drapes were removed. The patient was subsequently returned to the supine position where they were extubated by the anesthesia team. He was then transported to the post anesthesia care unit in stable condition. All sponge instrument and needle counts were reportedly correct at the end of this case.

## 2015-03-05 NOTE — Anesthesia Procedure Notes (Signed)
Procedure Name: Intubation Date/Time: 03/05/2015 12:52 PM Performed by: Barrington Ellison Pre-anesthesia Checklist: Patient identified, Emergency Drugs available, Suction available, Patient being monitored and Timeout performed Patient Re-evaluated:Patient Re-evaluated prior to inductionOxygen Delivery Method: Circle system utilized Preoxygenation: Pre-oxygenation with 100% oxygen Intubation Type: IV induction Ventilation: Mask ventilation without difficulty Laryngoscope Size: Mac and 3 Grade View: Grade I Tube type: Oral Tube size: 7.5 mm Number of attempts: 1 Airway Equipment and Method: Stylet Placement Confirmation: ETT inserted through vocal cords under direct vision,  positive ETCO2 and breath sounds checked- equal and bilateral Secured at: 22 cm Tube secured with: Tape Dental Injury: Teeth and Oropharynx as per pre-operative assessment

## 2015-03-05 NOTE — Transfer of Care (Signed)
Immediate Anesthesia Transfer of Care Note  Patient: [name]  Procedure(s) Performed: Procedure(s) with comments: LUMBAR FOUR-FIVE, LUMBAR FIVE-SACARAL ONE POSTERIOR LUMBAR FUSION (N/A) - L45 L5S1 laminectomies with posterior lumbar interbody fusion with interbody prosthesis posterior lateral arthrodesis and posterior segmental instrumentation  Patient Location: PACU  Anesthesia Type:General  Level of Consciousness: awake and patient cooperative  Airway & Oxygen Therapy: Patient Spontanous Breathing and Patient connected to nasal cannula oxygen  Post-op Assessment: Report given to RN and Patient moving all extremities X 4  Post vital signs: Reviewed and stable  Last Vitals:  Filed Vitals:   03/05/15 0822  BP: 135/73  Pulse:   Temp:   Resp:     Complications: No apparent anesthesia complications

## 2015-03-05 NOTE — Anesthesia Postprocedure Evaluation (Signed)
  Anesthesia Post-op Note  Patient: [name]  Procedure(s) Performed: Procedure(s) with comments: LUMBAR FOUR-FIVE, LUMBAR FIVE-SACARAL ONE POSTERIOR LUMBAR FUSION (N/A) - L45 L5S1 laminectomies with posterior lumbar interbody fusion with interbody prosthesis posterior lateral arthrodesis and posterior segmental instrumentation  Patient Location: PACU  Anesthesia Type:General  Level of Consciousness: awake and alert   Airway and Oxygen Therapy: Patient Spontanous Breathing  Post-op Pain: mild  Post-op Assessment: Post-op Vital signs reviewed LLE Motor Response: Purposeful movement LLE Sensation: Numbness, Tingling RLE Motor Response: Purposeful movement RLE Sensation: Numbness, Tingling      Post-op Vital Signs: stable  Last Vitals:  Filed Vitals:   03/05/15 1941  BP: 125/71  Pulse: 79  Temp: 36.7 C  Resp: 16    Complications: No apparent anesthesia complications

## 2015-03-05 NOTE — OR Nursing (Signed)
Mr. [name] arrived to neuro pacu from the OR on a stretcher.  Mr. [name] was attached to the monitor and oxygen.  Report received from [place].  Mr. [name] was very sleepy on arrival.  Dr. Arnoldo Morale came to assess the patient shortly after arriving to pacu and Mr. [name] could not follow Dr. Adline Mango instructions due to his level of sedation.  After about 10 mins, Mr. [name] started to bend both legs enough to lift them a short way off the bed. Dr. Arnoldo Morale returned to the bedside to assess his legs, Mr. [name] would not do what Dr. Arnoldo Morale asked. When I asked Mr. [name] to wiggle his toes, he did not wiggle his toes.  When I asked him to do other instructions like push down or pull up with his feet, he did not do so.  I explained to him that I needed to know that he could move his legs, assess his sensation, strength, etc. I turned Mr. [name] to his left to assess his dressing.  His dressing was clean, dry and intact.  We repositioned Mr. [name] to make him more comfortable.  He fell asleep.  A few moments later, he bent both of his legs enough to have both knees off the stetcher. Again, when I asked him to push/pull with both feet, he stated he couldn't.  I explained how I needed to clarify his ability to move before I call Dr. Arnoldo Morale.  He then wiggled his toes.  He stated both legs felt like they were cramping. After some sips of water, Mr. [name] swallowed a Valium for his cramps.

## 2015-03-06 LAB — BASIC METABOLIC PANEL
ANION GAP: 5 (ref 5–15)
BUN: 7 mg/dL (ref 6–20)
CALCIUM: 8.3 mg/dL — AB (ref 8.9–10.3)
CO2: 30 mmol/L (ref 22–32)
Chloride: 100 mmol/L — ABNORMAL LOW (ref 101–111)
Creatinine, Ser: 1.21 mg/dL (ref 0.61–1.24)
GFR calc non Af Amer: >60 mL/min (ref >60)
Glucose, Bld: 98 mg/dL (ref 65–99)
POTASSIUM: 4.1 mmol/L (ref 3.5–5.1)
Sodium: 135 mmol/L (ref 135–145)

## 2015-03-06 LAB — CBC
HCT: 36.9 % — ABNORMAL LOW (ref 39.0–52.0)
Hemoglobin: 11.8 g/dL — ABNORMAL LOW (ref 13.0–17.0)
MCH: 30.2 pg (ref 26.0–34.0)
MCHC: 32 g/dL (ref 30.0–36.0)
MCV: 94.4 fL (ref 78.0–100.0)
Platelets: 218 10*3/uL (ref 150–400)
RBC: 3.91 MIL/uL — AB (ref 4.22–5.81)
RDW: 13.6 % (ref 11.5–15.5)
WBC: 9.2 10*3/uL (ref 4.0–10.5)

## 2015-03-06 MED ORDER — PNEUMOCOCCAL VAC POLYVALENT 25 MCG/0.5ML IJ INJ
0.5000 mL | INJECTION | INTRAMUSCULAR | Status: AC
  Administered 2015-03-07: 0.5 mL via INTRAMUSCULAR
  Filled 2015-03-06: qty 0.5

## 2015-03-06 NOTE — Progress Notes (Signed)
Patient admitted from PACU, dressing c/d/i, Hemovac in place, and AXO4. Patient is complaining of numbness on left side of body. Foley is present. Patient has home medication that is present that the pharmacy does not provide. (Ribavirin and Ombitas packets). Patient given room information and call bell system explained. VSS. Will continue to monitor at this time.   Patient did not receive brace to walk this shift. Ortho tech notified that she would order brace. Will continue to monitor.

## 2015-03-06 NOTE — Progress Notes (Signed)
Pt unable to urinate.  Bladder scan 420 cc.  In/out cath 400 cc output.  Will continue to monitor. Cori Razor, RN

## 2015-03-06 NOTE — Progress Notes (Signed)
Patient ID: [name], male   DOB: Sep 16, 1950, 64 y.o.   MRN: [id number] Subjective:  The patient is alert and pleasant. [name] back is appropriately sore.  Objective: Vital signs in last 24 hours: Temp:  [97.7 F (36.5 C)-98.9 F (37.2 C)] 98.9 F (37.2 C) (08/04 0514) Pulse Rate:  [75-96] 80 (08/04 0514) Resp:  [11-24] 17 (08/04 0730) BP: (108-143)/(51-92) 112/68 mmHg (08/04 0514) SpO2:  [98 %-100 %] 99 % (08/04 0514) Weight:  [100.064 kg (220 lb 9.6 oz)] 100.064 kg (220 lb 9.6 oz) (08/03 2000)  Intake/Output from previous day: 08/03 0701 - 08/04 0700 In: 2900 [P.O.:480; I.V.:2200; Blood:120; IV Piggyback:100] Out: 3605 [Urine:2825; Drains:380; Blood:400] Intake/Output this shift: Total I/O In: 360 [P.O.:360] Out: -   Physical exam the patient is alert and oriented. [name] strength is grossly normal in [name] lower extremities except for giveaway.  Lab Results:  Recent Labs  03/06/15 0452  WBC 9.2  HGB 11.8*  HCT 36.9*  PLT 218   BMET  Recent Labs  03/06/15 0452  NA 135  K 4.1  CL 100*  CO2 30  GLUCOSE 98  BUN 7  CREATININE 1.21  CALCIUM 8.3*    Studies/Results: Dg Lumbar Spine Complete  03/05/2015   CLINICAL DATA:  lower lumbar fusion. L4 through S1 posterior lumbar interbody fusion.  EXAM: DG C-ARM 61-120 MIN; LUMBAR SPINE - COMPLETE 4+ VIEW  COMPARISON:  None.  FINDINGS: AP and lateral intraoperative fluoroscopic radiographs demonstrate placement of pedicle screws at L4, L5 and S1. Discectomy with interbody bone graft. Soft tissue retractors are present dorsally. Numbering is correlated with prior lumbar spine MRI 09/19/2014.  IMPRESSION: L4 through S1 discectomy and pedicle screw placement.   Electronically Signed   By: Dereck Ligas M.D.   On: 03/05/2015 17:51   Dg C-arm 61-120 Min  03/05/2015   CLINICAL DATA:  lower lumbar fusion. L4 through S1 posterior lumbar interbody fusion.  EXAM: DG C-ARM 61-120 MIN; LUMBAR SPINE - COMPLETE 4+ VIEW  COMPARISON:  None.   FINDINGS: AP and lateral intraoperative fluoroscopic radiographs demonstrate placement of pedicle screws at L4, L5 and S1. Discectomy with interbody bone graft. Soft tissue retractors are present dorsally. Numbering is correlated with prior lumbar spine MRI 09/19/2014.  IMPRESSION: L4 through S1 discectomy and pedicle screw placement.   Electronically Signed   By: Dereck Ligas M.D.   On: 03/05/2015 17:51    Assessment/Plan: Postop day #1: We will mobilize the patient with PT. We'll discontinue [name] Hemovac drain and hep lock [name] IV to aid in [name] mobilization.  LOS: 1 day     [name] D 03/06/2015, 10:06 AM

## 2015-03-06 NOTE — Evaluation (Signed)
Physical Therapy Evaluation Patient Details Name: [name] MRN: [id number] DOB: Jul 23, 1951 Today's Date: 8/4/[name]   History of Present Illness  Patient is a 63 yo male admitted 03/05/15 with lumbar DDD.  Patient s/p L4-S1 PLIF.  PMH:  chronic back pain, polysubstance abuse, anxiety, depression  Clinical Impression  Patient presents with problems listed below.  Will benefit from acute PT to maximize functional mobility prior to discharge.  Patient required mod assist for mobility today. Unable to take steps.  Patient does not have Holland hour assist. Recommend SNF at discharge for continued therapy.    Follow Up Recommendations SNF;Supervision/Assistance - Holland hour    Equipment Recommendations  3in1 (PT)    Recommendations for Other Services       Precautions / Restrictions Precautions Precautions: Back;Fall Precaution Booklet Issued: Yes (comment) Precaution Comments: Reviewed back precautions and information with patient. Required Braces or Orthoses: Spinal Brace Spinal Brace: Lumbar corset Restrictions Weight Bearing Restrictions: No      Mobility  Bed Mobility Overal bed mobility: Needs Assistance Bed Mobility: Rolling;Sidelying to Sit;Sit to Sidelying Rolling: Min assist Sidelying to sit: Mod assist     Sit to sidelying: Mod assist General bed mobility comments: Verbal cues for log rolling technique.  Min assist to maintain back precautions and complete roll.  Assist to raise trunk to upright sitting position.  Required min assist to maintain sitting balance initially.  Patient using UE support for balance.  Max assist to don back brace.  Required assist to lower trunk and bring LE's onto bed to return to sidelying.  Transfers Overall transfer level: Needs assistance Equipment used: Rolling walker (2 wheeled) Transfers: Sit to/from Stand Sit to Stand: Mod assist         General transfer comment: Verbal cues for hand placement and technique.  Patient attempted to  stand 3 times before reaching upright stance position.  Assist to rise to standing and for balance.  Patient stood x 3 minutes and became lightheaded.  Attempted to step in place - knees buckling and remained lightheaded.  Returned to sitting > sidelying.  Ambulation/Gait                Stairs            Wheelchair Mobility    Modified Rankin (Stroke Patients Only)       Balance Overall balance assessment: Needs assistance Sitting-balance support: Single extremity supported;Feet supported Sitting balance-Leahy Scale: Poor     Standing balance support: Bilateral upper extremity supported Standing balance-Leahy Scale: Poor                               Pertinent Vitals/Pain Pain Assessment: 0-10 Pain Score: 8  Pain Location: Back Pain Descriptors / Indicators: Aching;Sore Pain Intervention(s): Monitored during session;Repositioned;Patient requesting pain meds-RN notified    Home Living Family/patient expects to be discharged to:: Skilled nursing facility Living Arrangements: Alone Available Help at Discharge: Family;Available PRN/intermittently Type of Home: House         Home Equipment: Walker - 2 wheels;Cane - single point Additional Comments: Patient reports he has no one to help him at d/c.    Prior Function Level of Independence: Independent               Hand Dominance        Extremity/Trunk Assessment   Upper Extremity Assessment: Overall WFL for tasks assessed  Lower Extremity Assessment: RLE deficits/detail;LLE deficits/detail RLE Deficits / Details: Reports RLE is weak and feels numb       Communication   Communication: No difficulties  Cognition Arousal/Alertness: Awake/alert Behavior During Therapy: WFL for tasks assessed/performed Overall Cognitive Status: Within Functional Limits for tasks assessed                      General Comments      Exercises        Assessment/Plan     PT Assessment Patient needs continued PT services  PT Diagnosis Difficulty walking;Generalized weakness;Acute pain   PT Problem List Decreased strength;Decreased activity tolerance;Decreased balance;Decreased mobility;Decreased knowledge of use of DME;Decreased knowledge of precautions;Cardiopulmonary status limiting activity;Impaired sensation;Pain  PT Treatment Interventions DME instruction;Gait training;Functional mobility training;Therapeutic activities;Patient/family education   PT Goals (Current goals can be found in the Care Plan section) Acute Rehab PT Goals Patient Stated Goal: To go to Rehab (SNF) for a couple of weeks before going home PT Goal Formulation: With patient Time For Goal Achievement: 03/20/15 Potential to Achieve Goals: Good    Frequency Min 5X/week   Barriers to discharge Decreased caregiver support Patient lives alone.  Does not have Holland hour assist    Co-evaluation               End of Session Equipment Utilized During Treatment: Gait belt;Back brace Activity Tolerance: Patient limited by pain;Treatment limited secondary to medical complications (Comment) (Feeling lightheaded) Patient left: in bed;with call bell/phone within reach;with bed alarm set Nurse Communication: Mobility status;Patient requests pain meds         Time: 4287-6811 PT Time Calculation (min) (ACUTE ONLY): 18 min   Charges:   PT Evaluation $Initial PT Evaluation Tier I: 1 Procedure     PT G CodesDespina Pole [name], [name], [name], [name]

## 2015-03-07 LAB — URINALYSIS, ROUTINE W REFLEX MICROSCOPIC
BILIRUBIN URINE: NEGATIVE
Glucose, UA: NEGATIVE mg/dL
KETONES UR: NEGATIVE mg/dL
LEUKOCYTES UA: NEGATIVE
Nitrite: NEGATIVE
PH: 5.5 (ref 5.0–8.0)
PROTEIN: NEGATIVE mg/dL
Specific Gravity, Urine: 1.015 (ref 1.005–1.030)
UROBILINOGEN UA: 0.2 mg/dL (ref 0.0–1.0)

## 2015-03-07 LAB — URINE MICROSCOPIC-ADD ON

## 2015-03-07 MED ORDER — SODIUM CHLORIDE 0.9 % IV SOLN
Freq: Once | INTRAVENOUS | Status: AC
  Administered 2015-03-07: 01:00:00 via INTRAVENOUS

## 2015-03-07 MED ORDER — TAMSULOSIN HCL 0.4 MG PO CAPS
0.4000 mg | ORAL_CAPSULE | Freq: Every day | ORAL | Status: DC
  Administered 2015-03-07 – 2015-03-11 (×5): 0.4 mg via ORAL
  Filled 2015-03-07 (×5): qty 1

## 2015-03-07 MED FILL — Sodium Chloride IV Soln 0.9%: INTRAVENOUS | Qty: 2000 | Status: AC

## 2015-03-07 MED FILL — Heparin Sodium (Porcine) Inj 1000 Unit/ML: INTRAMUSCULAR | Qty: 30 | Status: AC

## 2015-03-07 NOTE — Clinical Social Work Note (Signed)
Clinical Social Work Assessment  Patient Details  Name: [name] MRN: [id number] Date of Birth: 1951/07/29  Date of referral:  03/07/15               Reason for consult:  Facility Placement                Housing/Transportation Living arrangements for the past 2 months:  Apartment Source of Information:  Patient Patient Interpreter Needed:  None Criminal Activity/Legal Involvement Pertinent to Current Situation/Hospitalization:  No - Comment as needed Significant Relationships:  Adult Children Lives with:  Self Do you feel safe going back to the place where you live?  No Need for family participation in patient care:  No (Coment)  Care giving concerns:  N/A   Facilities manager / plan: CSW met the pt at bedside. CSW introduced self and purpose of the visit. CSW discussed LOG SNF placement. CSW explained the LOG SNF process. CSW explained having no insurance and its relation to [facility] SNF placement. CSW provided the pt with a LOG SNF list. CSW answered all questions in which the pt inquired about. CSW will continue to follow this pt and assist with discharge as needed.   Employment status:  Unemployed Forensic scientist:  Self Pay (Medicaid Pending) PT Recommendations:  [place] / Referral to community resources:  [place]  Patient/Family's Response to care:  Pt reported that he has been properly cared for.   Patient/Family's Understanding of and Emotional Response to Diagnosis, Current Treatment, and Prognosis:  Pt reported being in pain. Pt acknowledged his diagnosis and the current treatment his has received. Pt is open to SNF placement.   Emotional Assessment Appearance:  Appears stated age Attitude/Demeanor/Rapport:   (Calm ) Affect (typically observed):   Accepting  Orientation:  Oriented to Self, Oriented to  Time, Oriented to Place, Oriented to Situation Alcohol / Substance use:  Not Applicable Psych involvement  (Current and /or in the community):  No (Comment)  Discharge Needs  Concerns to be addressed:  Denies Needs/Concerns at this time Readmission within the last 30 days:  No Current discharge risk:  None Barriers to Discharge:  No Barriers Identified   Jeremy Kulzer, LCSW 03/07/2015, 10:47 AM

## 2015-03-07 NOTE — Progress Notes (Addendum)
Patient unable to void since foley removed POD 1. Two IN&OUT performed previous shift. Bladder scanned greater than 300. Foley inserted per protocol.   Ave Filter, RN

## 2015-03-07 NOTE — Care Management Note (Signed)
Case Management Note  Patient Details  Name: Vinicio Lynk MRN: [id number] Date of Birth: Jul 07, 1951  Subjective/Objective:    64 y.o. M admitted for L4-S1, PLIF on 03/05/2015. Continues to have poor mobility with Urinary retention requiring Foley Catheter on POD#2. CSW following for PT recommendation of SNF placement. Questionable weekend Discharge.                 Action/Plan:No further CM needs at present.    Expected Discharge Date:                  Expected Discharge Plan:  Skilled Nursing Facility  In-House Referral:  Clinical Social Work  Discharge planning Services  CM Consult  Post Acute Care Choice:    Choice offered to:     DME Arranged:    DME Agency:     HH Arranged:    [place] Agency:     Status of Service:  In process, will continue to follow  Medicare Important Message Given:    Date Medicare IM Given:    Medicare IM give by:    Date Additional Medicare IM Given:    Additional Medicare Important Message give by:     If discussed at [place] of Stay Meetings, dates discussed:    Additional Comments:  Delrae Sawyers, RN 03/07/2015, 4:38 PM

## 2015-03-07 NOTE — Clinical Social Work Placement (Signed)
   CLINICAL SOCIAL WORK PLACEMENT  NOTE  Date:  03/07/2015  Patient Details  Name: Andyn Sales MRN: [id number] Date of Birth: [birth year]  Clinical Social Work is seeking post-discharge placement for this patient at the [place] level of care (*CSW will initial, date and re-position this form in  chart as items are completed):  Yes   Patient/family provided with [place] Work Department's list of facilities offering this level of care within the geographic area requested by the patient (or if unable, by the patient's family).  Yes   Patient/family informed of their freedom to choose among providers that offer the needed level of care, that participate in Medicare, Medicaid or managed care program needed by the patient, have an available bed and are willing to accept the patient.  Yes   Patient/family informed of [facility]'s ownership interest in [facility] and [facility], as well as of the fact that they are under no obligation to receive care at these facilities.  PASRR submitted to EDS on       PASRR number received on       Existing PASRR number confirmed on       FL2 transmitted to all facilities in geographic area requested by pt/family on       FL2 transmitted to all facilities within larger geographic area on       Patient informed that his/her managed care company has contracts with or will negotiate with certain facilities, including the following:            Patient/family informed of bed offers received.  Patient chooses bed at       Physician recommends and patient chooses bed at      Patient to be transferred to   on  .  Patient to be transferred to facility by       Patient family notified on   of transfer.  Name of family member notified:        PHYSICIAN       Additional Comment:    _______________________________________________ Greta Doom, LCSW 03/07/2015, 11:26 AM

## 2015-03-07 NOTE — Progress Notes (Addendum)
Patient exhibiting diaphoresis, increased HR, increased temperature, and slightly tachypneic. Notified Stern MD on call, orders received for 500 cc bolus, to reinstate 2L/[place], and encourage IS more frequently. Will continue to monitor.  245 In and out cath pt, residual out 500 ml. Pre bladder scan at 130 was 388 ml

## 2015-03-07 NOTE — Progress Notes (Signed)
Patient ID: [name], male   DOB: [birth year], 64 y.o.   MRN: [id number] Subjective:  The patient is alert and pleasant. He's had some urinary retention and the Foley catheter has been replaced. His back is appropriately sore.  Objective: Vital signs in last 24 hours: Temp:  [97.9 F (36.6 C)-100.4 F (38 C)] 99.4 F (37.4 C) (08/05 0620) Pulse Rate:  [87-120] 96 (08/05 0620) Resp:  [18-24] 18 (08/05 0620) BP: (104-115)/(69-74) 115/69 mmHg (08/05 0620) SpO2:  [92 %-100 %] 94 % (08/05 0620)  Intake/Output from previous day: 08/04 0701 - 08/05 0700 In: 360 [P.O.:360] Out: 825 [Urine:750; Drains:75] Intake/Output this shift:    Physical exam the patient is alert and pleasant. His strength is grossly normal in his lower extremities.  Lab Results:  Recent Labs  03/06/15 0452  WBC 9.2  HGB 11.8*  HCT 36.9*  PLT 218   BMET  Recent Labs  03/06/15 0452  NA 135  K 4.1  CL 100*  CO2 30  GLUCOSE 98  BUN 7  CREATININE 1.21  CALCIUM 8.3*    Studies/Results: Dg Lumbar Spine Complete  03/05/2015   CLINICAL DATA:  lower lumbar fusion. L4 through S1 posterior lumbar interbody fusion.  EXAM: DG C-ARM 61-120 MIN; LUMBAR SPINE - COMPLETE 4+ VIEW  COMPARISON:  None.  FINDINGS: AP and lateral intraoperative fluoroscopic radiographs demonstrate placement of pedicle screws at L4, L5 and S1. Discectomy with interbody bone graft. Soft tissue retractors are present dorsally. Numbering is correlated with prior lumbar spine MRI 09/19/2014.  IMPRESSION: L4 through S1 discectomy and pedicle screw placement.   Electronically Signed   By: Dereck Ligas M.D.   On: 03/05/2015 17:51   Dg C-arm 61-120 Min  03/05/2015   CLINICAL DATA:  lower lumbar fusion. L4 through S1 posterior lumbar interbody fusion.  EXAM: DG C-ARM 61-120 MIN; LUMBAR SPINE - COMPLETE 4+ VIEW  COMPARISON:  None.  FINDINGS: AP and lateral intraoperative fluoroscopic radiographs demonstrate placement of pedicle screws at L4, L5 and  S1. Discectomy with interbody bone graft. Soft tissue retractors are present dorsally. Numbering is correlated with prior lumbar spine MRI 09/19/2014.  IMPRESSION: L4 through S1 discectomy and pedicle screw placement.   Electronically Signed   By: Dereck Ligas M.D.   On: 03/05/2015 17:51    Assessment/Plan: Postop day #2: We will continue to mobilize the patient with PT.  Urinary retention: I will start Flomax and check a urine culture and urinalysis. We will plan to discontinue the catheter tomorrow and hopefully send him home then. I gave him his discharge instructions and answered all his questions.  LOS: 2 days     Kelis Plasse D 03/07/2015, 7:56 AM

## 2015-03-07 NOTE — Progress Notes (Signed)
Physical Therapy Treatment Patient Details Name: [name] MRN: [id number] DOB: 09/10/1950 Today's Date: 03/07/2015    History of Present Illness Patient is a 64 yo male admitted 03/05/15 with lumbar DDD.  Patient s/p L4-S1 PLIF.  PMH:  chronic back pain, polysubstance abuse, anxiety, depression    PT Comments    Slow progress towards physical therapy goals. Ambulates 5 feet today with notable buckling of LEs (Rt>Lt), but able to support self with RW and min assist from PT for balance. Tolerated pre-gait training, and therapeutic exercises. Remains appropriate for SNF for further rehabilitation. Patient will continue to benefit from skilled physical therapy services to further improve independence with functional mobility.   Follow Up Recommendations  SNF;Supervision/Assistance - 24 hour     Equipment Recommendations  3in1 (PT)    Recommendations for Other Services       Precautions / Restrictions Precautions Precautions: Back;Fall Precaution Comments: Reviewed back precautions and information with patient. recalled 0/3 Required Braces or Orthoses: Spinal Brace Spinal Brace: Lumbar corset Restrictions Weight Bearing Restrictions: No    Mobility  Bed Mobility Overal bed mobility: Needs Assistance Bed Mobility: Rolling;Sidelying to Sit Rolling: Min guard Sidelying to sit: Min guard       General bed mobility comments: close guard for safety. Max cues for technique. Used rail as needed. Slow and guarded.  Transfers Overall transfer level: Needs assistance Equipment used: Rolling walker (2 wheeled) Transfers: Sit to/from Stand Sit to Stand: Mod assist         General transfer comment: Mod assist for boost to stand from elevated bed surface. Slow to rise. Cues for hand placement and for posture to maintain precautions. Cues for technique with lowering self into chair. Some difficulty scooting posteriorly.  Ambulation/Gait Ambulation/Gait assistance: Min  assist Ambulation Distance (Feet): 5 Feet Assistive device: Rolling walker (2 wheeled) Gait Pattern/deviations: Step-through pattern;Decreased stride length;Shuffle;Trunk flexed Gait velocity: slow Gait velocity interpretation: <1.8 ft/sec, indicative of risk for recurrent falls General Gait Details: Pre-gait training with heavy reliance on RW for support, focused on weight-shifting and marching feet. Min assist for walker control and balance. Noted some buckling but able to regain balance with min assist and RW support. VC for sequencing and foot clearance. Very slow and guarded. Cues for walker placement with turns.    Stairs            Wheelchair Mobility    Modified Rankin (Stroke Patients Only)       Balance               Standing balance comment: Practiced marching, weight shifting and tried unsupported standing briefly but with loss of balance posteriorly. BIL knees are flexed but able to fix briefly with cues.                    Cognition Arousal/Alertness: Awake/alert Behavior During Therapy: WFL for tasks assessed/performed Overall Cognitive Status: Within Functional Limits for tasks assessed                      Exercises General Exercises - Lower Extremity Ankle Circles/Pumps: AROM;Both;10 reps;Seated Long Arc Quad: AROM;Strengthening;Both;10 reps;Seated    General Comments General comments (skin integrity, edema, etc.): practiced donning brace      Pertinent Vitals/Pain Pain Assessment: 0-10 Pain Score:  ("I'm feeling rough" no value given) Pain Location: back Pain Descriptors / Indicators: Aching;Sore Pain Intervention(s): Monitored during session;Repositioned;RN gave pain meds during session;Limited activity within patient's tolerance    Home  Living                      Prior Function            PT Goals (current goals can now be found in the care plan section) Acute Rehab PT Goals Patient Stated Goal: To go to  Rehab (SNF) for a couple of weeks before going home PT Goal Formulation: With patient Time For Goal Achievement: 03/20/15 Potential to Achieve Goals: Good Progress towards PT goals: Progressing toward goals    Frequency  Min 5X/week    PT Plan Current plan remains appropriate    Co-evaluation             End of Session Equipment Utilized During Treatment: Gait belt;Back brace Activity Tolerance: Patient limited by pain Patient left: with call bell/phone within reach;in chair;with chair alarm set     Time: 1210-1228 PT Time Calculation (min) (ACUTE ONLY): 18 min  Charges:  $Therapeutic Activity: 8-22 mins                    G Codes:      [name] 03-20-15, 1:02 PM [name], [place]

## 2015-03-07 NOTE — Clinical Social Work Note (Signed)
CSW submitted for the pt [name]. Pt's PAASAR is under manuel review given his mental health background. CSW will continue to follow up with the pt's [name]. Pt can be discharge to a SNF without a [name] number.    [place], MSW, [place]

## 2015-03-08 ENCOUNTER — Encounter (HOSPITAL_COMMUNITY): Payer: Self-pay | Admitting: General Practice

## 2015-03-08 LAB — URINE CULTURE: Culture: NO GROWTH

## 2015-03-08 MED ORDER — KCL IN DEXTROSE-NACL 20-5-0.45 MEQ/L-%-% IV SOLN
INTRAVENOUS | Status: DC
  Administered 2015-03-08 (×2): via INTRAVENOUS
  Administered 2015-03-09: 1000 mL via INTRAVENOUS
  Administered 2015-03-10: 02:00:00 via INTRAVENOUS
  Filled 2015-03-08 (×3): qty 1000

## 2015-03-08 NOTE — Progress Notes (Signed)
Filed Vitals:   03/07/15 1749 03/07/15 2120 03/08/15 0100 03/08/15 0539  BP: 117/65 115/66 112/70 103/71  Pulse: 88 88 85 93  Temp: 98.2 F (36.8 C) 98.3 F (36.8 C) 98.5 F (36.9 C) 98 F (36.7 C)  TempSrc: Oral Oral Oral Oral  Resp: 20 20 18 20   Height:      Weight:      SpO2: 100% 100% 96% 94%    CBC  Recent Labs  03/06/15 0452  WBC 9.2  HGB 11.8*  HCT 36.9*  PLT 218   BMET  Recent Labs  03/06/15 0452  NA 135  K 4.1  CL 100*  CO2 30  GLUCOSE 98  BUN 7  CREATININE 1.21  CALCIUM 8.3*    Patient with limited ambulation in room, with staff. Has not been ambulating in the halls. Foley still in place, we'll have nursing staff DC, now and monitor voiding function, and check PVRs with bladder scan. Encourage nursing staff to ambulate patient in the halls.  Patient indicates that he is interested in rehabilitation at a skilled nursing facility, we will request social work consultation.  Plan: Continue to progress to postoperative recovery. Monitor voiding function once Foley removed. We'll initiate referral to skilled nursing facility.  Hosie Spangle, MD 03/08/2015, 8:36 AM

## 2015-03-08 NOTE — Progress Notes (Signed)
Patient has not voided and has 400 ml, contacted on call they ordered an in/out once then if no urination and urine build up to 400 again a foley is to be placed. Orders for urine culture to be placed and new fluid order of 12 NS W/D% and 20 of K. Will continue to monitor.

## 2015-03-08 NOTE — Progress Notes (Signed)
Patient is not tolerating ambulation very well, he is having spells of dizziness and fatigue with minimal exertion, will continue to work with patient but would suggest a PT consult to evaluate patient.

## 2015-03-09 LAB — CBC WITH DIFFERENTIAL/PLATELET
Basophils Absolute: 0 10*3/uL (ref 0.0–0.1)
Basophils Relative: 0 % (ref 0–1)
Eosinophils Absolute: 0.3 10*3/uL (ref 0.0–0.7)
Eosinophils Relative: 3 % (ref 0–5)
HCT: 29.2 % — ABNORMAL LOW (ref 39.0–52.0)
Hemoglobin: 9.4 g/dL — ABNORMAL LOW (ref 13.0–17.0)
Lymphocytes Relative: 27 % (ref 12–46)
Lymphs Abs: 2.2 10*3/uL (ref 0.7–4.0)
MCH: 30 pg (ref 26.0–34.0)
MCHC: 32.2 g/dL (ref 30.0–36.0)
MCV: 93.3 fL (ref 78.0–100.0)
Monocytes Absolute: 0.7 10*3/uL (ref 0.1–1.0)
Monocytes Relative: 8 % (ref 3–12)
Neutro Abs: 5 10*3/uL (ref 1.7–7.7)
Neutrophils Relative %: 62 % (ref 43–77)
Platelets: 224 10*3/uL (ref 150–400)
RBC: 3.13 MIL/uL — ABNORMAL LOW (ref 4.22–5.81)
RDW: 12.9 % (ref 11.5–15.5)
WBC: 8.1 10*3/uL (ref 4.0–10.5)

## 2015-03-09 LAB — BASIC METABOLIC PANEL
Anion gap: 7 (ref 5–15)
BUN: 7 mg/dL (ref 6–20)
CO2: 29 mmol/L (ref 22–32)
Calcium: 8.3 mg/dL — ABNORMAL LOW (ref 8.9–10.3)
Chloride: 96 mmol/L — ABNORMAL LOW (ref 101–111)
Creatinine, Ser: 0.9 mg/dL (ref 0.61–1.24)
GFR calc Af Amer: >60 mL/min (ref >60)
GFR calc non Af Amer: >60 mL/min (ref >60)
Glucose, Bld: 105 mg/dL — ABNORMAL HIGH (ref 65–99)
Potassium: 3.9 mmol/L (ref 3.5–5.1)
Sodium: 132 mmol/L — ABNORMAL LOW (ref 135–145)

## 2015-03-09 NOTE — Progress Notes (Signed)
Filed Vitals:   03/08/15 1730 03/08/15 2103 03/09/15 0133 03/09/15 0515  BP: 107/66 98/66 112/53 104/61  Pulse: 85 89 93 95  Temp: 98.1 F (36.7 C) 98.5 F (36.9 C) 98.2 F (36.8 C) 98.2 F (36.8 C)  TempSrc: Oral Oral Oral Oral  Resp: 18 18 18 18   Height:      Weight:      SpO2: 95% 97% 94% 94%    Patient resting in bed, did ambulate in the halls with the nursing staff yesterday, but they report that he was weak and unsteady. Patient unable to void after Foley DC'd yesterday. Required 2 in and out catheterizations, and subsequently a Foley catheter was replaced to straight drainage. Urine culture sent with first catheterization, not yet reviewed by microbiology staff. Seen by PT 2 days ago, they did not see him yesterday.  Plan: Will leave Foley in and let bladder rest. Patient started on Flomax 2 days ago. We'll plan on voiding trial again on August 9. Ultimately patient will need rehabilitation.  Hosie Spangle, MD 03/09/2015, 8:41 AM

## 2015-03-10 LAB — URINE CULTURE
Culture: NO GROWTH
Special Requests: NORMAL

## 2015-03-10 NOTE — Progress Notes (Signed)
Physical Therapy Treatment Patient Details Name: [name] MRN: [id number] DOB: 11-Sep-1950 Today's Date: 03/10/2015    History of Present Illness Patient is a 64 yo male admitted 03/05/15 with lumbar DDD.  Patient s/p L4-S1 PLIF.  PMH:  chronic back pain, polysubstance abuse, anxiety, depression    PT Comments    Pt mobility con't to be greatly limited by surgical back pain. Pt only able to amb maximum of 10'. Pt with little carryover of back precautions. con't to recommend SNF upon d/c to allow for patient to achieve safe mod I level of function for safe transition back home.  Follow Up Recommendations  SNF;Supervision/Assistance - 24 hour     Equipment Recommendations  3in1 (PT)    Recommendations for Other Services       Precautions / Restrictions Precautions Precautions: Back;Fall Precaution Booklet Issued: Yes (comment) Precaution Comments: Reviewed back precautions and information with patient. recalled 0/3 Required Braces or Orthoses: Spinal Brace Spinal Brace: Lumbar corset Restrictions Weight Bearing Restrictions: No    Mobility  Bed Mobility Overal bed mobility: Needs Assistance Bed Mobility: Rolling;Sidelying to Sit Rolling: Min guard Sidelying to sit: Min guard       General bed mobility comments: max directional v/c's to adhere to precautions and for technique, increased time, increased back pain  Transfers Overall transfer level: Needs assistance Equipment used: Rolling walker (2 wheeled) Transfers: Sit to/from Stand Sit to Stand: Mod assist         General transfer comment: v/c's to push up from bed, required 2 attempts to achieve full upright standing, modA to maintain balance during transition of hands, max v/c's to extend knees to achieve full upright position. Pt with c/o "it hurts, it hurts."  Ambulation/Gait Ambulation/Gait assistance: Mod assist Ambulation Distance (Feet): 10 Feet Assistive device: Rolling walker (2 wheeled) Gait  Pattern/deviations: Step-to pattern;Decreased step length - left;Decreased step length - right;Decreased stance time - left;Shuffle Gait velocity: slow Gait velocity interpretation: <1.8 ft/sec, indicative of risk for recurrent falls General Gait Details: pt required max encouragement to participate in ambulation. bilat LE weakness L>R, greatly limited by pain   Stairs            Wheelchair Mobility    Modified Rankin (Stroke Patients Only)       Balance Overall balance assessment: Needs assistance Sitting-balance support: Single extremity supported Sitting balance-Leahy Scale: Poor     Standing balance support: Bilateral upper extremity supported Standing balance-Leahy Scale: Poor Standing balance comment: significant WBing on his UEs, limited by pain                    Cognition Arousal/Alertness: Awake/alert (however immeadiately became sleepy after receiving pain meds) Behavior During Therapy: WFL for tasks assessed/performed Overall Cognitive Status: Within Functional Limits for tasks assessed                      Exercises General Exercises - Lower Extremity Long Arc Quad: AROM;Strengthening;Both;10 reps;Seated    General Comments General comments (skin integrity, edema, etc.): worked on donning brace however limited carry over      Pertinent Vitals/Pain Pain Assessment: 0-10 Pain Score: 9  Pain Location: back Pain Descriptors / Indicators: Sharp Pain Intervention(s): Patient requesting pain meds-RN notified;RN gave pain meds during session    Home Living                      Prior Function  PT Goals (current goals can now be found in the care plan section) Acute Rehab PT Goals Patient Stated Goal: stop the pain Progress towards PT goals: Progressing toward goals    Frequency  Min 5X/week    PT Plan Current plan remains appropriate    Co-evaluation             End of Session Equipment Utilized During  Treatment: Gait belt;Back brace Activity Tolerance: Patient limited by pain Patient left: with call bell/phone within reach;in chair;with chair alarm set     Time: 9030-0923 PT Time Calculation (min) (ACUTE ONLY): 21 min  Charges:  $Gait Training: 8-22 mins                    G Codes:      Kingsley Callander 03/10/2015, 8:59 AM  Kittie Plater, PT, DPT Pager #: [phone] Office #: [phone]

## 2015-03-10 NOTE — Clinical Social Work Note (Addendum)
CSW is awaiting the pt's level II PASSAR number. Pt can be discharge to a SNF without a PASSAR number.    Addendum: Given the pt's mental health diagnosis a representative from [place]. Has been assigned to evaluation the pt prior to given the pt a PASSAR number. After the representative has completed their assessment (which can take up to 48 hours) the pt will be assigned a PASSAR.  [place], MSW, [place]

## 2015-03-10 NOTE — Progress Notes (Signed)
Filed Vitals:   03/09/15 1737 03/09/15 2216 03/10/15 0246 03/10/15 0619  BP: 126/80 135/62 144/75 138/83  Pulse: 81 90 101 82  Temp: 97.4 F (36.3 C) 100 F (37.8 C) 98.4 F (36.9 C) 99.7 F (37.6 C)  TempSrc: Oral Oral Oral Oral  Resp: 18 18 18 18   Height:      Weight:      SpO2: 95% 97% 98% 98%    CBC  Recent Labs  03/09/15 0730  WBC 8.1  HGB 9.4*  HCT 29.2*  PLT 224   BMET  Recent Labs  03/09/15 0730  NA 132*  K 3.9  CL 96*  CO2 29  GLUCOSE 105*  BUN 7  CREATININE 0.90  CALCIUM 8.3*    Patient sitting up in chair. Ambulated a short distance, to the doorway of the room, requiring strong encouragement from physical therapy who feels the patient needs rehabilitation at a skilled nursing facility. Foley remains to straight drainage. Urine culture remains pending. Social work Biochemist, clinical working on referral for [employer].  Plan: Continuing physical therapy. Referred for SNF placement.  Hosie Spangle, MD 03/10/2015, 8:59 AM

## 2015-03-11 MED ORDER — OXYCODONE-ACETAMINOPHEN 10-325 MG PO TABS: 1.0000 | ORAL_TABLET | ORAL | Status: DC | PRN

## 2015-03-11 MED ORDER — TAMSULOSIN HCL 0.4 MG PO CAPS: 0.4000 mg | ORAL_CAPSULE | Freq: Every day | ORAL | Status: DC

## 2015-03-11 MED ORDER — CYCLOBENZAPRINE HCL 10 MG PO TABS: 10.0000 mg | ORAL_TABLET | Freq: Three times a day (TID) | ORAL | Status: DC | PRN

## 2015-03-11 MED ORDER — DOCUSATE SODIUM 100 MG PO CAPS: 100.0000 mg | ORAL_CAPSULE | Freq: Two times a day (BID) | ORAL | Status: DC

## 2015-03-11 NOTE — Progress Notes (Signed)
Physical Therapy Treatment Patient Details Name: [name] MRN: [id number] DOB: Nov 12, 1950 Today's Date: 03/11/2015    History of Present Illness Patient is a 64 yo male admitted 03/05/15 with lumbar DDD.  Patient s/p L4-S1 PLIF.  PMH:  chronic back pain, polysubstance abuse, anxiety, depression    PT Comments    Pt is slowly progressing toward PT goals. Therapy session was limited today secondary to dizziness and lethargy. Pt was able to walk to bathroom room toilet with min assist using a RW from E[facility], however upon standing from toilet pt began to have a dizzy episode. Therapist took BP and checked o2 Levels and all were WNL. Reviewed pt back precautions before leaving. Reported to RN about dizziness. DC destination remains appropriate for pt to improve functional mobility.  Follow Up Recommendations  SNF;Supervision/Assistance - 24 hour     Equipment Recommendations  3in1 (PT)    Recommendations for Other Services       Precautions / Restrictions Precautions Precautions: Back;Fall Precaution Comments: Reviewed back precautions and information with patient. recalled 0/3 Required Braces or Orthoses: Spinal Brace Spinal Brace: Lumbar corset Restrictions Weight Bearing Restrictions: No    Mobility  Bed Mobility Overal bed mobility: Needs Assistance Bed Mobility: Sit to Sidelying         Sit to sidelying: Min guard General bed mobility comments: pt used bed railing to help lower himself down, increased time to  lift LE on to bed however no physical contact by therapist to assist   Transfers Overall transfer level: Needs assistance Equipment used: Rolling walker (2 wheeled) Transfers: Sit to/from [facility] Sit to Stand: Min assist Stand pivot transfers: Min assist       General transfer comment: vc for hand placement on rw, two attempts to stand from toilet when in bathroom, increased time to complete taskl  Ambulation/Gait Ambulation/Gait  assistance: Min assist Ambulation Distance (Feet): 5 Feet Assistive device: Rolling walker (2 wheeled) Gait Pattern/deviations: Step-to pattern;Decreased stride length;Antalgic;Narrow base of support;Trunk flexed Gait velocity: decreased  Gait velocity interpretation: Below normal speed for age/gender General Gait Details: pt ambulate from bed to the bathroom toilet, vc for standing [place] straight and staying in close proximity to walker.   Stairs            Wheelchair Mobility    Modified Rankin (Stroke Patients Only)       Balance Overall balance assessment: Needs assistance Sitting-balance support: No upper extremity supported;Feet supported Sitting balance-Leahy Scale: Fair Sitting balance - Comments: pt able to sit without the use of UE;s for support however once fatigued or in  pain pt uses both arms for support.   Standing balance support: Bilateral upper extremity supported;During functional activity Standing balance-Leahy Scale: Poor Standing balance comment: pt needs RW to maintain balance                    Cognition Arousal/Alertness: Lethargic (diffuclty maintaining eyes open) Behavior During Therapy: Flat affect Overall Cognitive Status: Within Functional Limits for tasks assessed                      Exercises      General Comments General comments (skin integrity, edema, etc.): pt felt dizzy standing from toilet wich limited therapy session      Pertinent Vitals/Pain Pain Assessment: 0-10 Pain Score: 6  Pain Location: back Pain Descriptors / Indicators: Sore;Aching Pain Intervention(s): Premedicated before session;Monitored during session    Home Living  Prior Function            PT Goals (current goals can now be found in the care plan section) Acute Rehab PT Goals Patient Stated Goal: none stated  Progress towards PT goals: Progressing toward goals    Frequency  Min 5X/week    PT Plan  Current plan remains appropriate    Co-evaluation             End of Session Equipment Utilized During Treatment: Gait belt;Back brace Activity Tolerance: Patient limited by fatigue Patient left: in bed;with bed alarm set     Time: 1828-8337 PT Time Calculation (min) (ACUTE ONLY): 24 min  Charges:  $Gait Training: 8-22 mins $Therapeutic Activity: 8-22 mins                    G Codes:      [name] 18-Mar-2015, 1:12 PM   [name], [name] (student physical therapy assistant) Acute Rehab [phone]

## 2015-03-11 NOTE — Discharge Summary (Signed)
Physician Discharge Summary  Patient ID: [name] MRN: [id number] DOB/AGE: 1950-10-05 64 y.o.  Admit date: 03/05/2015 Discharge date: 03/11/2015  Admission Diagnoses: L4-5 and L5-S1 degenerative disease, lumbago, lumbar radiculopathy, urinary retention  Discharge Diagnoses: The same Active Problems:   Lumbar degenerative disc disease   Discharged Condition: good  Hospital Course: I performed an L4-5 and L5-S1 decompression, instrumentation, and fusion on the patient on 03/05/2015. The surgery went well.  The patient's postoperative course was remarkable for urinary retention. Urinalysis and urine cultures were normal. The patient was started on Flomax. The plan is to discontinue his Foley catheter this morning. The patient did not feel he was able to go home. Arrangements were made for transfer to a skilled nursing facility. The patient was given oral and written discharge instructions. All his questions were answered.  Consults: PT Significant Diagnostic Studies: None Treatments: L4-5 and L5-S1 decompression, instrumentation, and fusion Discharge Exam: Blood pressure 126/73, pulse 88, temperature 98.2 F (36.8 C), temperature source Oral, resp. rate 17, height 5\' 10"  (1.778 m), weight 100.064 kg (220 lb 9.6 oz), SpO2 98 %. The patient is alert and pleasant. His strength is normal in his lower extremities. His wound is healing well.  Disposition: Skilled nursing facility  Discharge Instructions    Call MD for:  difficulty breathing, headache or visual disturbances    Complete by:  As directed      Call MD for:  extreme fatigue    Complete by:  As directed      Call MD for:  hives    Complete by:  As directed      Call MD for:  persistant dizziness or light-headedness    Complete by:  As directed      Call MD for:  persistant nausea and vomiting    Complete by:  As directed      Call MD for:  redness, tenderness, or signs of infection (pain, swelling, redness, odor or  green/yellow discharge around incision site)    Complete by:  As directed      Call MD for:  severe uncontrolled pain    Complete by:  As directed      Call MD for:  temperature >100.4    Complete by:  As directed      Diet - low sodium heart healthy    Complete by:  As directed      Discharge instructions    Complete by:  As directed   Call [phone] for a followup appointment. Take a stool softener while you are using pain medications.     Driving Restrictions    Complete by:  As directed   Do not drive for 2 weeks.     Increase activity slowly    Complete by:  As directed      Lifting restrictions    Complete by:  As directed   Do not lift more than 5 pounds. No excessive bending or twisting.     May shower / Bathe    Complete by:  As directed   He may shower after the pain she is removed 3 days after surgery. Leave the incision alone.     No dressing needed    Complete by:  As directed             Medication List    STOP taking these medications        Hydrocodone-Acetaminophen 10-300 MG Tabs  Commonly known as:  VICODIN HP  RA NAPROXEN SODIUM 220 MG tablet  Generic drug:  naproxen sodium      TAKE these medications        cyclobenzaprine 10 MG tablet  Commonly known as:  FLEXERIL  Take 1 tablet (10 mg total) by mouth 3 (three) times daily as needed for muscle spasms.     diphenhydrAMINE 25 mg capsule  Commonly known as:  BENADRYL  Take 1 capsule (25 mg total) by mouth every 4 (four) hours as needed for itching.     DSS 100 MG Caps  Take 100 mg by mouth 2 (two) times daily.     docusate sodium 100 MG capsule  Commonly known as:  COLACE  Take 1 capsule (100 mg total) by mouth 2 (two) times daily.     DULoxetine 60 MG capsule  Commonly known as:  CYMBALTA  Take 1 capsule (60 mg total) by mouth daily.     Ombitas-Paritapre-Ritona-Dasab 12.5-75-50 &250 MG Tbpk  Commonly known as:  VIEKIRA PAK  Take 1 packet by mouth 2 (two) times daily.      oxyCODONE-acetaminophen 10-325 MG per tablet  Commonly known as:  PERCOCET  Take 1 tablet by mouth every 4 (four) hours as needed for pain.     ribavirin 200 MG tablet  Commonly known as:  COPEGUS  Take 3 tablets (600 mg total) by mouth 2 (two) times daily.     tamsulosin 0.4 MG Caps capsule  Commonly known as:  FLOMAX  Take 1 capsule (0.4 mg total) by mouth daily.         SignedOphelia Charter 03/11/2015, 7:47 AM

## 2015-03-11 NOTE — Progress Notes (Signed)
Discharge orders received. Pt notified and verbalized understanding. IV removed. Pt unable to void after removal of foley catheter. MD notified and gave verbal order to place foley catheter. Report called to [facility]. Writer picked up pt's home medications from pharmacy and placed in pt's belongings. Pt dressed and belongings packed. Pt picked up by [facility].

## 2015-03-11 NOTE — Clinical Social Work Placement (Signed)
   CLINICAL SOCIAL WORK PLACEMENT  NOTE  Date:  03/11/2015  Patient Details  Name: [name] MRN: [id number] Date of Birth: 1951/07/25  Clinical Social Work is seeking post-discharge placement for this patient at the [place] level of care (*CSW will initial, date and re-position this form in  chart as items are completed):  Yes   Patient/family provided with [facility] Work Department's list of facilities offering this level of care within the geographic area requested by the patient (or if unable, by the patient's family).  Yes   Patient/family informed of their freedom to choose among providers that offer the needed level of care, that participate in Medicare, Medicaid or managed care program needed by the patient, have an available bed and are willing to accept the patient.  Yes   Patient/family informed of 's ownership interest in [facility], as well as of the fact that they are under no obligation to receive care at these facilities.  PASRR submitted to EDS on 03/09/15     PASRR number received on 03/11/15     Existing PASRR number confirmed on       FL2 transmitted to all facilities in geographic area requested by pt/family on       FL2 transmitted to all facilities within larger geographic area on 03/11/15     Patient informed that his/her managed care company has contracts with or will negotiate with certain facilities, including the following:        Yes   Patient/family informed of bed offers received.  Patient chooses bed at [facility]     Physician recommends and patient chooses bed at      Patient to be transferred to [facility] on 03/11/15.  Patient to be transferred to facility by [facility]      Patient family notified on 03/11/15 of transfer.  Name of family member notified:  pt will notify family     PHYSICIAN       Additional Comment:     _______________________________________________ Greta Doom, LCSW 03/11/2015, 12:37 PM

## 2015-05-28 ENCOUNTER — Other Ambulatory Visit: Payer: Self-pay | Admitting: Urology

## 2015-06-09 ENCOUNTER — Encounter (HOSPITAL_BASED_OUTPATIENT_CLINIC_OR_DEPARTMENT_OTHER): Payer: Self-pay | Admitting: *Deleted

## 2015-06-09 NOTE — Progress Notes (Signed)
NPO AFTER MN.  ARRIVE AT 0730.  NEEDS ISTAT 8.  CURRENT EKG.  WILL TAKE PROTONIX AM DOS AND PAIN RX IF NEEDED W/ SIPS OF WATER. PT AWARE POSS. OWER, STATES DOES NOT HAVE ANYONE AT HOME AND NEEDS TO STAY OVERNIGHT.  [facility] WILL BE  TRANSPORTING HIM DOS AND NEXT DAY [phone].

## 2015-06-16 ENCOUNTER — Encounter (HOSPITAL_BASED_OUTPATIENT_CLINIC_OR_DEPARTMENT_OTHER)
Admission: RE | Disposition: A | Discharge status: Home / Self Care | Payer: Self-pay | Source: Ambulatory Visit | Attending: Urology

## 2015-06-16 ENCOUNTER — Encounter (HOSPITAL_BASED_OUTPATIENT_CLINIC_OR_DEPARTMENT_OTHER): Payer: Self-pay

## 2015-06-16 ENCOUNTER — Ambulatory Visit (HOSPITAL_BASED_OUTPATIENT_CLINIC_OR_DEPARTMENT_OTHER): Payer: Medicaid Other | Admitting: Anesthesiology

## 2015-06-16 ENCOUNTER — Ambulatory Visit (HOSPITAL_BASED_OUTPATIENT_CLINIC_OR_DEPARTMENT_OTHER)
Admission: RE | Admit: 2015-06-16 | Discharge: 2015-06-17 | Disposition: A | Discharge status: Home / Self Care | Payer: Medicaid Other | Source: Ambulatory Visit | Attending: Urology | Admitting: Urology

## 2015-06-16 DIAGNOSIS — K219 Gastro-esophageal reflux disease without esophagitis: Secondary | ICD-10-CM | POA: Insufficient documentation

## 2015-06-16 DIAGNOSIS — R338 Other retention of urine: Secondary | ICD-10-CM | POA: Insufficient documentation

## 2015-06-16 DIAGNOSIS — R3912 Poor urinary stream: Secondary | ICD-10-CM | POA: Diagnosis not present

## 2015-06-16 DIAGNOSIS — Z96652 Presence of left artificial knee joint: Secondary | ICD-10-CM | POA: Diagnosis not present

## 2015-06-16 DIAGNOSIS — B182 Chronic viral hepatitis C: Secondary | ICD-10-CM | POA: Diagnosis not present

## 2015-06-16 DIAGNOSIS — R339 Retention of urine, unspecified: Secondary | ICD-10-CM | POA: Diagnosis present

## 2015-06-16 DIAGNOSIS — G8929 Other chronic pain: Secondary | ICD-10-CM | POA: Diagnosis not present

## 2015-06-16 DIAGNOSIS — M5137 Other intervertebral disc degeneration, lumbosacral region: Secondary | ICD-10-CM | POA: Insufficient documentation

## 2015-06-16 DIAGNOSIS — M545 Low back pain: Secondary | ICD-10-CM | POA: Diagnosis not present

## 2015-06-16 DIAGNOSIS — N401 Enlarged prostate with lower urinary tract symptoms: Secondary | ICD-10-CM | POA: Diagnosis present

## 2015-06-16 DIAGNOSIS — N138 Other obstructive and reflux uropathy: Secondary | ICD-10-CM | POA: Diagnosis present

## 2015-06-16 DIAGNOSIS — R351 Nocturia: Secondary | ICD-10-CM | POA: Insufficient documentation

## 2015-06-16 DIAGNOSIS — M1990 Unspecified osteoarthritis, unspecified site: Secondary | ICD-10-CM | POA: Insufficient documentation

## 2015-06-16 HISTORY — DX: Gastro-esophageal reflux disease without esophagitis: K21.9

## 2015-06-16 HISTORY — DX: Low back pain, unspecified: M54.50

## 2015-06-16 HISTORY — DX: Low back pain: M54.5

## 2015-06-16 HISTORY — DX: Presence of urogenital implants: Z96.0

## 2015-06-16 HISTORY — DX: Chronic viral hepatitis C: B18.2

## 2015-06-16 HISTORY — DX: Other chronic pain: G89.29

## 2015-06-16 HISTORY — DX: Presence of other specified devices: Z97.8

## 2015-06-16 HISTORY — DX: Retention of urine, unspecified: R33.9

## 2015-06-16 HISTORY — PX: TRANSURETHRAL RESECTION OF PROSTATE: SHX73

## 2015-06-16 HISTORY — DX: Benign prostatic hyperplasia without lower urinary tract symptoms: N40.0

## 2015-06-16 HISTORY — DX: Other intervertebral disc degeneration, lumbosacral region without mention of lumbar back pain or lower extremity pain: M51.379

## 2015-06-16 HISTORY — DX: Cocaine abuse, in remission: F14.11

## 2015-06-16 HISTORY — DX: Other intervertebral disc degeneration, lumbosacral region: M51.37

## 2015-06-16 LAB — POCT I-STAT, CHEM 8
BUN: 7 mg/dL (ref 6–20)
CALCIUM ION: 1.26 mmol/L (ref 1.13–1.30)
CREATININE: 1 mg/dL (ref 0.61–1.24)
Chloride: 102 mmol/L (ref 101–111)
GLUCOSE: 98 mg/dL (ref 65–99)
HCT: 47 % (ref 39.0–52.0)
HEMOGLOBIN: 16 g/dL (ref 13.0–17.0)
POTASSIUM: 3.9 mmol/L (ref 3.5–5.1)
Sodium: 141 mmol/L (ref 135–145)
TCO2: 24 mmol/L (ref 0–100)

## 2015-06-16 SURGERY — TRANSURETHRAL RESECTION OF THE PROSTATE WITH GYRUS INSTRUMENTS
Anesthesia: General | Site: Prostate

## 2015-06-16 MED ORDER — PANTOPRAZOLE SODIUM 40 MG PO TBEC
40.0000 mg | DELAYED_RELEASE_TABLET | Freq: Every morning | ORAL | Status: DC
  Administered 2015-06-17: 40 mg via ORAL
  Filled 2015-06-16 (×3): qty 1

## 2015-06-16 MED ORDER — SODIUM CHLORIDE 0.9 % IV SOLN
INTRAVENOUS | Status: AC
  Filled 2015-06-16: qty 50

## 2015-06-16 MED ORDER — FENTANYL CITRATE (PF) 100 MCG/2ML IJ SOLN
INTRAMUSCULAR | Status: AC
  Filled 2015-06-16: qty 2

## 2015-06-16 MED ORDER — PROPOFOL 10 MG/ML IV BOLUS
INTRAVENOUS | Status: AC
  Filled 2015-06-16: qty 20

## 2015-06-16 MED ORDER — BELLADONNA ALKALOIDS-OPIUM 16.2-60 MG RE SUPP
RECTAL | Status: AC
  Filled 2015-06-16: qty 1

## 2015-06-16 MED ORDER — OXYCODONE-ACETAMINOPHEN 5-325 MG PO TABS
ORAL_TABLET | ORAL | Status: AC
  Filled 2015-06-16: qty 1

## 2015-06-16 MED ORDER — FENTANYL CITRATE (PF) 100 MCG/2ML IJ SOLN
INTRAMUSCULAR | Status: DC | PRN
  Administered 2015-06-16 (×2): 25 ug via INTRAVENOUS
  Administered 2015-06-16: 50 ug via INTRAVENOUS

## 2015-06-16 MED ORDER — LACTATED RINGERS IV SOLN
INTRAVENOUS | Status: DC
  Administered 2015-06-16: 08:00:00 via INTRAVENOUS
  Filled 2015-06-16: qty 1000

## 2015-06-16 MED ORDER — PROPOFOL 10 MG/ML IV BOLUS
INTRAVENOUS | Status: DC | PRN
  Administered 2015-06-16: 200 mg via INTRAVENOUS

## 2015-06-16 MED ORDER — ONDANSETRON HCL 4 MG/2ML IJ SOLN
INTRAMUSCULAR | Status: DC | PRN
  Administered 2015-06-16: 4 mg via INTRAVENOUS

## 2015-06-16 MED ORDER — OXYCODONE-ACETAMINOPHEN 5-325 MG PO TABS
1.0000 | ORAL_TABLET | ORAL | Status: DC | PRN
Start: 2015-06-16 — End: 2015-06-17
  Administered 2015-06-16 (×3): 1 via ORAL
  Filled 2015-06-16: qty 1

## 2015-06-16 MED ORDER — STERILE WATER FOR IRRIGATION IR SOLN
Status: DC | PRN
  Administered 2015-06-16: 500 mL

## 2015-06-16 MED ORDER — AMPICILLIN-SULBACTAM SODIUM 1.5 (1-0.5) G IJ SOLR
INTRAMUSCULAR | Status: AC
  Filled 2015-06-16: qty 1.5

## 2015-06-16 MED ORDER — LIDOCAINE HCL (CARDIAC) 20 MG/ML IV SOLN
INTRAVENOUS | Status: DC | PRN
  Administered 2015-06-16: 50 mg via INTRAVENOUS

## 2015-06-16 MED ORDER — BELLADONNA ALKALOIDS-OPIUM 16.2-60 MG RE SUPP
1.0000 | Freq: Four times a day (QID) | RECTAL | Status: DC | PRN
  Administered 2015-06-16: 1 via RECTAL
  Filled 2015-06-16: qty 1

## 2015-06-16 MED ORDER — FENTANYL CITRATE (PF) 100 MCG/2ML IJ SOLN
25.0000 ug | INTRAMUSCULAR | Status: DC | PRN
Start: 2015-06-16 — End: 2015-06-17
  Administered 2015-06-16: 25 ug via INTRAVENOUS
  Administered 2015-06-16 (×2): 50 ug via INTRAVENOUS
  Administered 2015-06-16: 25 ug via INTRAVENOUS
  Administered 2015-06-16: 50 ug via INTRAVENOUS
  Filled 2015-06-16: qty 1

## 2015-06-16 MED ORDER — ACETAMINOPHEN 325 MG PO TABS
650.0000 mg | ORAL_TABLET | ORAL | Status: DC | PRN
  Filled 2015-06-16: qty 2

## 2015-06-16 MED ORDER — LIDOCAINE HCL (CARDIAC) 20 MG/ML IV SOLN
INTRAVENOUS | Status: AC
  Filled 2015-06-16: qty 5

## 2015-06-16 MED ORDER — DOCUSATE SODIUM 100 MG PO CAPS
100.0000 mg | ORAL_CAPSULE | Freq: Two times a day (BID) | ORAL | Status: DC
  Administered 2015-06-16 – 2015-06-17 (×2): 100 mg via ORAL
  Filled 2015-06-16: qty 1

## 2015-06-16 MED ORDER — OXYCODONE HCL 5 MG PO TABS
ORAL_TABLET | ORAL | Status: AC
  Filled 2015-06-16: qty 1

## 2015-06-16 MED ORDER — FENTANYL CITRATE (PF) 100 MCG/2ML IJ SOLN
25.0000 ug | INTRAMUSCULAR | Status: DC | PRN
  Filled 2015-06-16: qty 1

## 2015-06-16 MED ORDER — OXYCODONE HCL 5 MG PO TABS
5.0000 mg | ORAL_TABLET | ORAL | Status: DC | PRN
  Administered 2015-06-16 – 2015-06-17 (×3): 5 mg via ORAL
  Filled 2015-06-16: qty 1

## 2015-06-16 MED ORDER — SODIUM CHLORIDE 0.9 % IR SOLN
Status: DC | PRN
  Administered 2015-06-16: 3000 mL via INTRAVESICAL
  Administered 2015-06-16: 6000 mL via INTRAVESICAL
  Administered 2015-06-16: 500 mL via INTRAVESICAL
  Administered 2015-06-16: 3000 mL via INTRAVESICAL
  Administered 2015-06-16: 500 mL via INTRAVESICAL
  Administered 2015-06-16 (×2): 3000 mL via INTRAVESICAL

## 2015-06-16 MED ORDER — PROMETHAZINE HCL 25 MG/ML IJ SOLN
6.2500 mg | INTRAMUSCULAR | Status: DC | PRN
  Filled 2015-06-16: qty 1

## 2015-06-16 MED ORDER — DULOXETINE HCL 60 MG PO CPEP
60.0000 mg | ORAL_CAPSULE | Freq: Every day | ORAL | Status: DC
  Administered 2015-06-16 – 2015-06-17 (×2): 60 mg via ORAL
  Filled 2015-06-16 (×3): qty 1

## 2015-06-16 MED ORDER — ONDANSETRON HCL 4 MG/2ML IJ SOLN
4.0000 mg | INTRAMUSCULAR | Status: DC | PRN
  Filled 2015-06-16: qty 2

## 2015-06-16 MED ORDER — MIDAZOLAM HCL 2 MG/2ML IJ SOLN
INTRAMUSCULAR | Status: AC
  Filled 2015-06-16: qty 4

## 2015-06-16 MED ORDER — GENTAMICIN SULFATE 40 MG/ML IJ SOLN
5.0000 mg/kg | INTRAVENOUS | Status: AC
  Administered 2015-06-16: 420 mg via INTRAVENOUS
  Filled 2015-06-16 (×2): qty 10.5

## 2015-06-16 MED ORDER — ONDANSETRON HCL 4 MG/2ML IJ SOLN
INTRAMUSCULAR | Status: AC
  Filled 2015-06-16: qty 2

## 2015-06-16 MED ORDER — RIBAVIRIN 200 MG PO TABS: 1200.0000 mg/d | ORAL_TABLET | Freq: Two times a day (BID) | ORAL | Status: DC

## 2015-06-16 MED ORDER — SODIUM CHLORIDE 0.9 % IV SOLN
1.5000 g | Freq: Four times a day (QID) | INTRAVENOUS | Status: AC
  Administered 2015-06-16 – 2015-06-17 (×3): 1.5 g via INTRAVENOUS
  Filled 2015-06-16: qty 1.5

## 2015-06-16 MED ORDER — SODIUM CHLORIDE 0.45 % IV SOLN
INTRAVENOUS | Status: DC
  Administered 2015-06-16: 13:00:00 via INTRAVENOUS
  Filled 2015-06-16 (×2): qty 1000

## 2015-06-16 MED ORDER — FENTANYL CITRATE (PF) 100 MCG/2ML IJ SOLN
INTRAMUSCULAR | Status: AC
  Filled 2015-06-16: qty 4

## 2015-06-16 MED ORDER — GENTAMICIN SULFATE 40 MG/ML IJ SOLN
5.0000 mg/kg | INTRAMUSCULAR | Status: DC
  Filled 2015-06-16: qty 12.5

## 2015-06-16 MED ORDER — DEXAMETHASONE SODIUM PHOSPHATE 4 MG/ML IJ SOLN
INTRAMUSCULAR | Status: DC | PRN
  Administered 2015-06-16: 10 mg via INTRAVENOUS

## 2015-06-16 MED ORDER — OXYCODONE-ACETAMINOPHEN 10-325 MG PO TABS: 1.0000 | ORAL_TABLET | ORAL | Status: DC | PRN

## 2015-06-16 MED ORDER — DEXAMETHASONE SODIUM PHOSPHATE 10 MG/ML IJ SOLN
INTRAMUSCULAR | Status: AC
  Filled 2015-06-16: qty 1

## 2015-06-16 MED ORDER — MIDAZOLAM HCL 5 MG/5ML IJ SOLN
INTRAMUSCULAR | Status: DC | PRN
  Administered 2015-06-16: 2 mg via INTRAVENOUS

## 2015-06-16 MED ORDER — DOCUSATE SODIUM 100 MG PO CAPS
ORAL_CAPSULE | ORAL | Status: AC
  Filled 2015-06-16: qty 1

## 2015-06-16 MED ORDER — ZOLPIDEM TARTRATE 5 MG PO TABS
5.0000 mg | ORAL_TABLET | Freq: Every evening | ORAL | Status: DC | PRN
  Administered 2015-06-17: 5 mg via ORAL
  Filled 2015-06-16: qty 1

## 2015-06-16 SURGICAL SUPPLY — 36 items
BAG DRAIN URO-CYSTO SKYTR STRL (DRAIN) ×3 IMPLANT
BAG URINE DRAINAGE (UROLOGICAL SUPPLIES) ×3 IMPLANT
BAG URINE LEG 19OZ MD ST LTX (BAG) IMPLANT
CANISTER SUCT LVC 12 LTR MEDI- (MISCELLANEOUS) IMPLANT
CATH FOLEY 2WAY SLVR  5CC 20FR (CATHETERS)
CATH FOLEY 2WAY SLVR  5CC 22FR (CATHETERS)
CATH FOLEY 2WAY SLVR 30CC 22FR (CATHETERS) IMPLANT
CATH FOLEY 2WAY SLVR 5CC 20FR (CATHETERS) IMPLANT
CATH FOLEY 2WAY SLVR 5CC 22FR (CATHETERS) IMPLANT
CATH FOLEY 3WAY 30CC 22F (CATHETERS) ×3 IMPLANT
CATH HEMA 3WAY 30CC 24FR COUDE (CATHETERS) IMPLANT
CATH HEMA 3WAY 30CC 24FR RND (CATHETERS) IMPLANT
CLOTH BEACON ORANGE TIMEOUT ST (SAFETY) ×3 IMPLANT
ELECT REM PT RETURN 9FT ADLT (ELECTROSURGICAL)
ELECTRODE REM PT RTRN 9FT ADLT (ELECTROSURGICAL) IMPLANT
EVACUATOR MICROVAS BLADDER (UROLOGICAL SUPPLIES) ×3 IMPLANT
GLOVE BIO SURGEON STRL SZ8 (GLOVE) ×3 IMPLANT
GOWN STRL REUS W/ TWL LRG LVL3 (GOWN DISPOSABLE) IMPLANT
GOWN STRL REUS W/ TWL XL LVL3 (GOWN DISPOSABLE) IMPLANT
GOWN STRL REUS W/TWL LRG LVL3 (GOWN DISPOSABLE) ×3 IMPLANT
GOWN STRL REUS W/TWL XL LVL3 (GOWN DISPOSABLE) ×3 IMPLANT
HOLDER FOLEY CATH W/STRAP (MISCELLANEOUS) ×3 IMPLANT
IV NS 500ML (IV SOLUTION) ×4
IV NS 500ML BAXH (IV SOLUTION) ×2 IMPLANT
IV NS IRRIG 3000ML ARTHROMATIC (IV SOLUTION) ×18 IMPLANT
KIT ROOM TURNOVER WOR (KITS) ×3 IMPLANT
LOOP CUT BIPOLAR 24F LRG (ELECTROSURGICAL) ×3 IMPLANT
MANIFOLD NEPTUNE II (INSTRUMENTS) ×3 IMPLANT
PACK CYSTO (CUSTOM PROCEDURE TRAY) ×3 IMPLANT
PLUG CATH AND CAP STER (CATHETERS) IMPLANT
SET ASPIRATION TUBING (TUBING) ×3 IMPLANT
SYR 30ML LL (SYRINGE) ×3 IMPLANT
SYRINGE IRR TOOMEY STRL 70CC (SYRINGE) IMPLANT
TUBE CONNECTING 12'X1/4 (SUCTIONS) ×1
TUBE CONNECTING 12X1/4 (SUCTIONS) ×2 IMPLANT
WATER STERILE IRR 500ML POUR (IV SOLUTION) ×3 IMPLANT

## 2015-06-16 NOTE — Anesthesia Procedure Notes (Signed)
Procedure Name: LMA Insertion Date/Time: 06/16/2015 9:22 AM Performed by: Bethena Roys T Pre-anesthesia Checklist: Patient identified, Emergency Drugs available, Suction available and Patient being monitored Patient Re-evaluated:Patient Re-evaluated prior to inductionOxygen Delivery Method: Circle System Utilized Preoxygenation: Pre-oxygenation with 100% oxygen Intubation Type: IV induction Ventilation: Mask ventilation without difficulty LMA: LMA inserted LMA Size: 4.0 Number of attempts: 1 Airway Equipment and Method: Bite block Placement Confirmation: positive ETCO2 Tube secured with: Tape Dental Injury: Teeth and Oropharynx as per pre-operative assessment

## 2015-06-16 NOTE — Anesthesia Preprocedure Evaluation (Addendum)
Anesthesia Evaluation  Patient identified by MRN, date of birth, ID band Patient awake    Reviewed: Allergy & Precautions, H&P , NPO status , Patient's Chart, lab work & pertinent test results  History of Anesthesia Complications Negative for: history of anesthetic complications  Airway Mallampati: II   Neck ROM: Full  Mouth opening: Limited Mouth Opening  Dental  (+) Dental Advisory Given, Upper Dentures, Lower Dentures   Pulmonary neg pulmonary ROS,    Pulmonary exam normal breath sounds clear to auscultation       Cardiovascular Exercise Tolerance: Good negative cardio ROS Normal cardiovascular exam Rhythm:Regular Rate:Normal     Neuro/Psych PSYCHIATRIC DISORDERS Anxiety Depression    GI/Hepatic GERD  Medicated,(+)     substance abuse  alcohol use, Hepatitis -, C  Endo/Other    Renal/GU      Musculoskeletal  (+) Arthritis ,   Abdominal   Peds  Hematology  (+) Blood dyscrasia, anemia ,   Anesthesia Other Findings 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   Dental advisory given  Plan Discussed with: CRNA, Anesthesiologist and Surgeon  Anesthesia Plan Comments: (Risks/benefits of general anesthesia discussed with patient including risk of damage to teeth, lips, gum, and tongue, nausea/vomiting, allergic  reactions to medications, and the possibility of heart attack, stroke and death.  All patient questions answered.  Patient wishes to proceed.)        Anesthesia Quick Evaluation

## 2015-06-16 NOTE — Anesthesia Postprocedure Evaluation (Signed)
  Anesthesia Post-op Note  Patient: [name]  Procedure(s) Performed: Procedure(s) (LRB): TRANSURETHRAL RESECTION OF THE PROSTATE WITH GYRUS INSTRUMENTS (N/A)  Patient Location: PACU  Anesthesia Type: General  Level of Consciousness: awake and alert   Airway and Oxygen Therapy: Patient Spontanous Breathing  Post-op Pain: mild  Post-op Assessment: Post-op Vital signs reviewed, Patient's Cardiovascular Status Stable, Respiratory Function Stable, Patent Airway and No signs of Nausea or vomiting  Last Vitals:  Filed Vitals:   06/16/15 1145  BP: 107/76  Pulse: 98  Temp: 36.4 C  Resp: 16    Post-op Vital Signs: stable   Complications: No apparent anesthesia complications

## 2015-06-16 NOTE — Op Note (Signed)
Preoperative diagnosis: 1. Bladder outlet obstruction secondary to BPH  Postoperative diagnosis:  1. Bladder outlet obstruction secondary to BPH  Procedure:  1. Cystoscopy 2. Transurethral resection of prostate chips of the prostate  Surgeon: Lillette Boxer. Rowena Moilanen, M.D.  Anesthesia: General  Complications: None  Drain: Foley catheter  EBL: Minimal  Specimens: 1. Prostate chips  Disposition of specimens: Pathology  Indication: [name] is a patient with bladder outlet obstruction secondary to benign prostatic hyperplasia. After reviewing the management options for treatment, he elected to proceed with the above surgical procedure(s). We have discussed the potential benefits and risks of the procedure, side effects of the proposed treatment, the likelihood of the patient achieving the goals of the procedure, and any potential problems that might occur during the procedure or recuperation. Informed consent has been obtained.  Description of procedure:  The patient was identified in the holding area.He received preoperative antibiotics. He was then taken to the operating room. General anesthetic was administered.  The patient was then placed in the dorsal lithotomy position, prepped and draped in the usual sterile fashion. Timeout was then performed.  A resectoscope sheath was placed using the visual obturator, and subsequently the resectoscope, loop and telescope were placed.  The bladder was then systematically examined in its entirety. There was no evidence of  tumors, stones, or other mucosal pathology.  The ureteral orifices were identified and marked so as to be avoided during the procedure.  The prostate adenoma was then resected utilizing loop cautery resection with the monopolar/bipolar cutting loop.  The prostate adenoma from the bladder neck back to the verumontanum was resected beginning at the six o'clock position and then extended to include the right and left lobes  of the prostate and anterior prostate, respectively. Care was taken not to resect distal to the verumontanum.  Hemostasis was then achieved with the cautery and the bladder was emptied and reinspected with no significant bleeding noted at the end of the procedure.  Resected chips were irrigated from the bladder with the evacuator and sent to pathology.  A 22 french 3 way catheter was then placed into the bladder and placed on continuous bladder irrigation.  The patient appeared to tolerate the procedure well and without complications. The patient was able to be awakened and transferred to the recovery unit in satisfactory condition. He tolerated the procedure well.

## 2015-06-16 NOTE — H&P (Signed)
Urology History and Physical Exam  CC: Urinary retention  HPI: 64 year old male with a history of urinary retention since lumbar surgery earlier this year. He initially required a foley catheter, but now is on self intermittent catheterization. UDS were performed revealing bladder outlet obstruction. He presents now for TUR-P.  PMH: Past Medical History  Diagnosis Date  . Depression   . Anxiety   . Chronic low back pain   . BPH (benign prostatic hyperplasia)   . Urinary retention   . GERD (gastroesophageal reflux disease)   . History of cocaine abuse     per pt quit and last used 06-03-2013  . Alcohol abuse   . Arthritis   . DDD (degenerative disc disease), lumbosacral   . Foley catheter in place   . Chronic hepatitis C without hepatic coma ([place]) FOLLOWED BY INFECTOUS DISEASE DR COMER    POSITIVE ANTIBODY TEST THIS YEAR 2016--  CURRENT TX ON HARVONI PO    PSH: Past Surgical History  Procedure Laterality Date  . 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 Posterior lumbar fusion  03-05-2015    laminectomy /  nerve decompression--  L4 -- S1  . Closed reduction and intramaxillary fixation bilateral mental fx's/  placement maxillary stent/ aperture wire placement  01-04-2000  . Appendectomy  1980's    Allergies: No Known Allergies  Medications: No prescriptions prior to admission     Social History: Social History   Social History  . Marital Status: Divorced    Spouse Name: N/A  . Number of Children: N/A  . Years of Education: N/A   Occupational History  . Not on file.   Social History Main Topics  . Smoking status: Never Smoker   . Smokeless tobacco: Never Used  . Alcohol Use: 0.6 oz/week    1 Cans of beer per week     Comment: currently 3 beer weekly --  per pt hx alcohol abuse til 2013  . Drug Use: No     Comment: per pt  quit and last used crack 06-03-2013  . Sexual Activity: Not on file   Other Topics Concern  . Not on file   Social History Narrative    Family History: History reviewed. No pertinent family history.   ROS:   Genitourinary: nocturia, difficulty starting the urinary stream, weak urinary stream and urinary stream starts and stops.  Musculoskeletal: back pain.                 Physical Exam:  Constitutional: Well nourished and well developed . No acute distress.  ENT:. The ears and nose are normal in appearance.  Neck: The appearance of the neck is normal.  Pulmonary: No respiratory distress and normal respiratory rhythm and effort.  Abdomen: The abdomen is rounded. No hernias are palpable.  Rectal: Rectal exam demonstrates normal sphincter tone, the anus is normal on inspection., no tenderness and no masses. Prostate size is estimated to be 50 g. Normal rectal tone, no rectal masses, prostate is smooth, symmetric and non-tender. The prostate has no nodularity and is not tender. The left seminal vesicle is nonpalpable. The right seminal vesicle is nonpalpable. The perineum is normal on inspection.  Genitourinary: Examination of the penis demonstrates an indwelling catheter. The penis is circumcised. The scrotum is normal in appearance. The right testis is atrophic. The left  testis is atrophic.  Lymphatics: The femoral and inguinal nodes are not enlarged or tender.  Skin: Normal skin turgor, no visible rash and no visible skin lesions.  Neuro/Psych:. Mood and affect are appropriate.  COR: RRR  Studies:  No results for input(s): HGB, WBC, PLT in the last 72 hours.  No results for input(s): NA, K, CL, CO2, BUN, CREATININE, CALCIUM, GFRNONAA, GFRAA in the last 72 hours.  Invalid input(s): MAGNESIUM   No results for input(s): INR, APTT in the last 72 hours.  Invalid input(s): PT   Invalid input(s): ABG    Assessment:  BPH with retention  Plan: TUR-P

## 2015-06-16 NOTE — Transfer of Care (Signed)
Immediate Anesthesia Transfer of Care Note  Patient: [name]  Procedure(s) Performed: Procedure(s): TRANSURETHRAL RESECTION OF THE PROSTATE WITH GYRUS INSTRUMENTS (N/A)  Patient Location: PACU  Anesthesia Type:General  Level of Consciousness: awake, alert  and oriented  Airway & Oxygen Therapy: Patient Spontanous Breathing and Patient connected to nasal cannula oxygen  Post-op Assessment: Report given to RN  Post vital signs: Reviewed  Last Vitals:  Filed Vitals:   06/16/15 0751  BP: 134/88  Pulse: 92  Temp: 36.9 C  Resp: 18    Complications: No apparent anesthesia complications

## 2015-06-17 ENCOUNTER — Encounter (HOSPITAL_BASED_OUTPATIENT_CLINIC_OR_DEPARTMENT_OTHER): Payer: Self-pay | Admitting: Urology

## 2015-06-17 DIAGNOSIS — N401 Enlarged prostate with lower urinary tract symptoms: Secondary | ICD-10-CM | POA: Diagnosis not present

## 2015-06-17 MED ORDER — ZOLPIDEM TARTRATE 5 MG PO TABS
ORAL_TABLET | ORAL | Status: AC
Start: 2015-06-17 — End: 2015-06-17
  Filled 2015-06-17: qty 1

## 2015-06-17 MED ORDER — OXYCODONE HCL 5 MG PO TABS
ORAL_TABLET | ORAL | Status: AC
  Filled 2015-06-17: qty 1

## 2015-06-17 MED ORDER — OXYCODONE HCL 10 MG PO TABS: 10.0000 mg | ORAL_TABLET | ORAL | Status: DC | PRN

## 2015-06-17 MED ORDER — PHENAZOPYRIDINE HCL 100 MG PO TABS
ORAL_TABLET | ORAL | Status: AC
  Filled 2015-06-17: qty 2

## 2015-06-17 MED ORDER — PHENAZOPYRIDINE HCL 200 MG PO TABS: 200.0000 mg | ORAL_TABLET | Freq: Three times a day (TID) | ORAL | Status: DC | PRN

## 2015-06-17 MED ORDER — SULFAMETHOXAZOLE-TRIMETHOPRIM 800-160 MG PO TABS: 1.0000 | ORAL_TABLET | Freq: Two times a day (BID) | ORAL | Status: DC

## 2015-06-17 MED ORDER — DOCUSATE SODIUM 100 MG PO CAPS
ORAL_CAPSULE | ORAL | Status: AC
  Filled 2015-06-17: qty 1

## 2015-06-17 MED ORDER — AMPICILLIN-SULBACTAM SODIUM 1.5 (1-0.5) G IJ SOLR
INTRAMUSCULAR | Status: AC
Start: 2015-06-17 — End: 2015-06-17
  Filled 2015-06-17: qty 1.5

## 2015-06-17 MED ORDER — PHENAZOPYRIDINE HCL 200 MG PO TABS
200.0000 mg | ORAL_TABLET | Freq: Once | ORAL | Status: AC
  Administered 2015-06-17: 200 mg via ORAL
  Filled 2015-06-17: qty 1

## 2015-06-17 NOTE — Discharge Summary (Signed)
Physician Discharge Summary  Patient ID: [name] MRN: [id number] DOB/AGE: Jan 27, 1951 64 y.o.  Admit date: 06/16/2015 Discharge date: 06/17/2015  Admission Diagnoses: BPH with outlet obstruction  Discharge Diagnoses:  Active Problems:   Enlarged prostate with urinary obstruction   Discharged Condition: good  Hospital Course: He developed urinary retention after back surgery and was admitted for elective transurethral resection of his prostate. He underwent TURP without complication and was observed overnight. He did well with no significant pain nausea or vomiting. The following morning he had no complaint and his Foley catheter had been removed. He had ordered he voided a small amount. He is felt ready for discharge.   Discharge Exam: Blood pressure 133/70, pulse 109, temperature 97.7 F (36.5 C), temperature source Oral, resp. rate 16, height 5\' 11"  (1.803 m), weight 96.163 kg (212 lb), SpO2 96 %. General appearance: alert, awake and in no distress   abdomen is soft and nontender without mass.  Disposition: 03-Skilled Nursing Facility  Discharge Instructions    Discharge patient    Complete by:  As directed             Medication List    STOP taking these medications        cefdinir 300 MG capsule  Commonly known as:  OMNICEF     tamsulosin 0.4 MG Caps capsule  Commonly known as:  FLOMAX      TAKE these medications        DULoxetine 60 MG capsule  Commonly known as:  CYMBALTA  Take 1 capsule (60 mg total) by mouth daily.     Ombitas-Paritapre-Ritona-Dasab 12.5-75-50 &250 MG Tbpk  Commonly known as:  VIEKIRA PAK  Take 1 packet by mouth 2 (two) times daily.     Oxycodone HCl 10 MG Tabs  Take 1 tablet (10 mg total) by mouth every 4 (four) hours as needed.     oxyCODONE-acetaminophen 10-325 MG tablet  Commonly known as:  PERCOCET  Take 1 tablet by mouth every 4 (four) hours as needed for pain.     pantoprazole 40 MG tablet  Commonly known as:  PROTONIX   Take 40 mg by mouth every morning.     phenazopyridine 200 MG tablet  Commonly known as:  PYRIDIUM  Take 1 tablet (200 mg total) by mouth 3 (three) times daily as needed for pain.     ribavirin 200 MG tablet  Commonly known as:  COPEGUS  Take 3 tablets (600 mg total) by mouth 2 (two) times daily.     sulfamethoxazole-trimethoprim 800-160 MG tablet  Commonly known as:  BACTRIM DS,SEPTRA DS  Take 1 tablet by mouth 2 (two) times daily.           Follow-up Information    Follow up with Jorja Loa, MD.   Specialty:  Urology   Why:  As scheduled   Contact information:   [address] [place] [address] [phone]       Signed: Claybon Jabs 06/17/2015, 7:09 AM

## 2015-06-17 NOTE — Discharge Instructions (Signed)
Transurethral Resection of the Prostate ° °Care After ° °Refer to this sheet in the next few weeks. These discharge instructions provide you with general information on caring for yourself after you leave the hospital. Your caregiver may also give you specific instructions. Your treatment has been planned according to the most current medical practices available, but unavoidable complications sometimes occur. If you have any problems or questions after discharge, please call your caregiver. ° °HOME CARE INSTRUCTIONS  ° °Medications °· You may receive medicine for pain management. As your level of discomfort decreases, adjustments in your pain medicines may be made.  °· Take all medicines as directed.  °· You may be given a medicine (antibiotic) to kill germs following surgery. Finish all medicines. Let your caregiver know if you have any side effects or problems from the medicine.  °· If you are on aspirin, it would be best not to restart the aspirin until the blood in the urine clears °Hygiene °· You can take a shower after surgery.  °· You should not take a bath while you still have the urethral catheter. °Activity °· You will be encouraged to get out of bed as much as possible and increase your activity level as tolerated.  °· Spend the first week in and around your home. For 3 weeks, avoid the following:  °· Straining.  °· Running.  °· Strenuous work.  °· Walks longer than a few blocks.  °· Riding for extended periods.  °· Sexual relations.  °· Do not lift heavy objects (more than 20 pounds) for at least 1 month. When lifting, use your arms instead of your abdominal muscles.  °· You will be encouraged to walk as tolerated. Do not exert yourself. Increase your activity level slowly. Remember that it is important to keep moving after an operation of any type. This cuts down on the possibility of developing blood clots.  °· Your caregiver will tell you when you can resume driving and light housework. Discuss this  at your first office visit after discharge. °Diet °· No special diet is ordered after a TURP. However, if you are on a special diet for another medical problem, it should be continued.  °· Normal fluid intake is usually recommended.  °· Avoid alcohol and caffeinated drinks for 2 weeks. They irritate the bladder. Decaffeinated drinks are okay.  °· Avoid spicy foods.  °Bladder Function °· For the first 10 days, empty the bladder whenever you feel a definite desire. Do not try to hold the urine for long periods of time.  °· Urinating once or twice a night even after you are healed is not uncommon.  °· You may see some recurrence of blood in the urine after discharge from the hospital. This usually happens within 2 weeks after the procedure.If this occurs, force fluids again as you did in the hospital and reduce your activity.  °Bowel Function °· You may experience some constipation after surgery. This can be minimized by increasing fluids and fiber in your diet. Drink enough water and fluids to keep your urine clear or pale yellow.  °· A stool softener may be prescribed for use at home. Do not strain to move your bowels.  °· If you are requiring increased pain medicine, it is important that you take stool softeners to prevent constipation. This will help to promote proper healing by reducing the need to strain to move your bowels.  °Sexual Activity °· Semen movement in the opposite direction and into the bladder (  retrograde ejaculation) may occur. Since the semen passes into the bladder, cloudy urine can occur the first time you urinate after intercourse. Or, you may not have an ejaculation during erection. Ask your caregiver when you can resume sexual activity. Retrograde ejaculation and reduced semen discharge should not reduce one's pleasure of intercourse.  °Postoperative Visit °· Arrange the date and time of your after surgery visit with your caregiver.  °Return to Work °· After your recovery is complete, you will  be able to return to work and resume all activities. Your caregiver will inform you when you can return to work.  °Foley Catheter Care °A soft, flexible tube (Foley catheter) may have been placed in your bladder to drain urine and fluid. Follow these instructions: °Taking Care of the Catheter °· Keep the area where the catheter leaves your body clean.  °· Attach the catheter to the leg so there is no tension on the catheter.  °· Keep the drainage bag below the level of the bladder, but keep it OFF the floor.  °· Do not take [place] soaking baths. Your caregiver will give instructions about showering.  °· Wash your hands before touching ANYTHING related to the catheter or bag.  °· Using mild soap and warm water on a washcloth:  °· Clean the area closest to the catheter insertion site using a circular motion around the catheter.  °· Clean the catheter itself by wiping AWAY from the insertion site for several inches down the tube.  °· NEVER wipe upward as this could sweep bacteria up into the urethra (tube in your body that normally drains the bladder) and cause infection.  °· Place a small amount of sterile lubricant at the tip of the penis where the catheter is entering.  °Taking Care of the Drainage Bags °· Two drainage bags may be taken home: a large overnight drainage bag, and a smaller leg bag which fits underneath clothing.  °· It is okay to wear the overnight bag at any time, but NEVER wear the smaller leg bag at night.  °· Keep the drainage bag well below the level of your bladder. This prevents backflow of urine into the bladder and allows the urine to drain freely.  °· Anchor the tubing to your leg to prevent pulling or tension on the catheter. Use tape or a leg strap provided by the hospital.  °· Empty the drainage bag when it is 1/2 to 3/4 full. Wash your hands before and after touching the bag.  °· Periodically check the tubing for kinks to make sure there is no pressure on the tubing which could restrict  the flow of urine.  °Changing the Drainage Bags °· Cleanse both ends of the clean bag with alcohol before changing.  °· Pinch off the rubber catheter to avoid urine spillage during the disconnection.  °· Disconnect the dirty bag and connect the clean one.  °· Empty the dirty bag carefully to avoid a urine spill.  °· Attach the new bag to the leg with tape or a leg strap.  °Cleaning the Drainage Bags °· Whenever a drainage bag is disconnected, it must be cleaned quickly so it is ready for the next use.  °· Wash the bag in warm, soapy water.  °· Rinse the bag thoroughly with warm water.  °· Soak the bag for 30 minutes in a solution of white vinegar and water (1 cup vinegar to 1 quart warm water).  °· Rinse with warm water.  °SEEK MEDICAL   CARE IF:   You have chills or night sweats.   You are leaking around your catheter or have problems with your catheter. It is not uncommon to have sporadic leakage around your catheter as a result of bladder spasms. If the leakage stops, there is not much need for concern. If you are uncertain, call your caregiver.   You develop side effects that you think are coming from your medicines.  SEEK IMMEDIATE MEDICAL CARE IF:   You are suddenly unable to urinate. Check to see if there are any kinks in the drainage tubing that may cause this. If you cannot find any kinks, call your caregiver immediately. This is an emergency.   You develop shortness of breath or chest pains.   Bleeding persists or clots develop in your urine.   You have a fever.   You develop pain in your back or over your lower belly (abdomen).   You develop pain or swelling in your legs.   Any problems you are having get worse rather than better.  MAKE SURE YOU:   Understand these instructions.   Will watch your condition.   Will get help right away if you are not doing well or get worse.  Document Released: 07/19/2005 Document Revised: 03/31/2011 Document Reviewed: 03/12/2009 [facility]  Patient Information 2012 [place].Transurethral Resection of the Prostate Care After Refer to this sheet in the next few weeks. These discharge instructions provide you with general information on caring for yourself after you leave the hospital. Your caregiver may also give you specific instructions. Your treatment has been planned according to the most current medical practices available, but unavoidable complications sometimes occur. If you have any problems or questions after discharge, please call your caregiver. Post Anesthesia Home Care Instructions  Activity: Get plenty of rest for the remainder of the day. A responsible adult should stay with you for 24 hours following the procedure.  For the next 24 hours, DO NOT: -Drive a car -Paediatric nurse -Drink alcoholic beverages -Take any medication unless instructed by your physician -Make any legal decisions or sign important papers.  Meals: Start with liquid foods such as gelatin or soup. Progress to regular foods as tolerated. Avoid greasy, spicy, heavy foods. If nausea and/or vomiting occur, drink only clear liquids until the nausea and/or vomiting subsides. Call your physician if vomiting continues.  Special Instructions/Symptoms: Your throat may feel dry or sore from the anesthesia or the breathing tube placed in your throat during surgery. If this causes discomfort, gargle with warm salt water. The discomfort should disappear within 24 hours.  If you had a scopolamine patch placed behind your ear for the management of post- operative nausea and/or vomiting:  1. The medication in the patch is effective for 72 hours, after which it should be removed.  Wrap patch in a tissue and discard in the trash. Wash hands thoroughly with soap and water. 2. You may remove the patch earlier than 72 hours if you experience unpleasant side effects which may include dry mouth, dizziness or visual disturbances. 3. Avoid touching the patch. Wash  your hands with soap and water after contact with the patch.

## 2015-07-01 ENCOUNTER — Telehealth: Payer: Self-pay | Admitting: Pharmacist Clinician (PhC)/ Clinical Pharmacy Specialist

## 2015-07-01 DIAGNOSIS — B182 Chronic viral hepatitis C: Secondary | ICD-10-CM

## 2015-07-01 NOTE — Telephone Encounter (Signed)
[name] called about his hep C related question. He was started in June Viekira/riba. We were trying to avoid riba at the time with other meds but medicaid only had viekira on formulary at that time. Talking of the phone with him, there seems to be a gap in his therapy somewhere. His first VL was undetectable. He stated that he still is finishing out this meds. I'm not sure how this is possible. We will need to bring him back for labs and f/u visit with Dr. Linus Salmons.

## 2015-07-03 NOTE — Telephone Encounter (Signed)
Yes, thanks.  He will need to come in for labs and follow up with me.

## 2015-07-07 ENCOUNTER — Other Ambulatory Visit: Payer: Medicaid Other

## 2015-07-08 ENCOUNTER — Telehealth: Payer: Self-pay | Admitting: Pharmacy Technician

## 2015-08-12 ENCOUNTER — Ambulatory Visit: Payer: Medicaid Other | Admitting: Internal Medicine

## 2015-09-18 NOTE — Telephone Encounter (Signed)
Called about future follow up appointment with Dr. Linus Salmons.

## 2015-10-15 ENCOUNTER — Telehealth: Payer: Self-pay | Admitting: Pharmacy Technician

## 2015-10-15 NOTE — Telephone Encounter (Signed)
Need for Mr. Albany to come back in for follow up HCV quantitative labs and follow up visit with Dr. Linus Salmons

## 2016-01-06 ENCOUNTER — Other Ambulatory Visit: Payer: Self-pay | Admitting: Pharmacist Clinician (PhC)/ Clinical Pharmacy Specialist

## 2016-01-06 DIAGNOSIS — B182 Chronic viral hepatitis C: Secondary | ICD-10-CM

## 2016-01-06 NOTE — Progress Notes (Signed)
We have tried to reached out to [name] several times to come back for labs for his hep C. He has a hx of gaps in his therapy some where. It's hard to figure out. He is going to come back for VL next week and f/u Dr. Linus Salmons in August.

## 2016-01-13 ENCOUNTER — Other Ambulatory Visit: Payer: Medicaid Other

## 2016-01-19 ENCOUNTER — Telehealth: Payer: Self-pay | Admitting: *Deleted

## 2016-01-19 NOTE — Telephone Encounter (Signed)
We have tried multiple times to get him in but was never successful.

## 2016-01-19 NOTE — Telephone Encounter (Signed)
Patient called, confused about his appointments.  Confirmed 8/1 with Dr. Linus Salmons, rescheduled missed labs from 6/13 to 6/26 (pt uses medicaid transportation, needs 3+ days to coordinate transportation). Landis Gandy, RN

## 2016-01-26 ENCOUNTER — Other Ambulatory Visit: Payer: Medicaid Other

## 2016-01-26 DIAGNOSIS — B182 Chronic viral hepatitis C: Secondary | ICD-10-CM

## 2016-01-26 LAB — COMPLETE METABOLIC PANEL WITH GFR
ALT: 13 U/L (ref 9–46)
AST: 17 U/L (ref 10–35)
Albumin: 4.4 g/dL (ref 3.6–5.1)
Alkaline Phosphatase: 69 U/L (ref 40–115)
BUN: 15 mg/dL (ref 7–25)
CHLORIDE: 102 mmol/L (ref 98–110)
CO2: 26 mmol/L (ref 20–31)
CREATININE: 1.04 mg/dL (ref 0.70–1.25)
Calcium: 10.2 mg/dL (ref 8.6–10.3)
GFR, EST AFRICAN AMERICAN: 88 mL/min (ref >=60)
GFR, Est Non African American: 76 mL/min (ref >=60)
GLUCOSE: 87 mg/dL (ref 65–99)
Potassium: 4.9 mmol/L (ref 3.5–5.3)
SODIUM: 138 mmol/L (ref 135–146)
Total Bilirubin: 0.4 mg/dL (ref 0.2–1.2)
Total Protein: 7.7 g/dL (ref 6.1–8.1)

## 2016-01-27 LAB — HEPATITIS C RNA QUANTITATIVE: HCV QUANT: NOT DETECTED [IU]/mL (ref <15)

## 2016-03-02 ENCOUNTER — Ambulatory Visit: Payer: Medicaid Other | Admitting: Internal Medicine

## 2016-03-31 ENCOUNTER — Ambulatory Visit: Payer: Medicaid Other | Admitting: Internal Medicine

## 2016-04-29 ENCOUNTER — Other Ambulatory Visit: Payer: Self-pay | Admitting: Neurosurgery

## 2016-04-29 DIAGNOSIS — G8929 Other chronic pain: Secondary | ICD-10-CM

## 2016-04-29 DIAGNOSIS — M545 Low back pain, unspecified: Secondary | ICD-10-CM

## 2016-05-11 ENCOUNTER — Ambulatory Visit
Admission: RE | Admit: 2016-05-11 | Discharge: 2016-05-11 | Disposition: A | Discharge status: Home / Self Care | Payer: Medicaid Other | Source: Ambulatory Visit | Attending: Neurosurgery | Admitting: Neurosurgery

## 2016-05-11 DIAGNOSIS — G8929 Other chronic pain: Secondary | ICD-10-CM

## 2016-05-11 DIAGNOSIS — M545 Low back pain: Principal | ICD-10-CM

## 2016-05-26 ENCOUNTER — Other Ambulatory Visit: Payer: Self-pay | Admitting: Neurosurgery

## 2016-05-26 DIAGNOSIS — M961 Postlaminectomy syndrome, not elsewhere classified: Secondary | ICD-10-CM

## 2016-06-09 ENCOUNTER — Ambulatory Visit
Admission: RE | Admit: 2016-06-09 | Discharge: 2016-06-09 | Disposition: A | Discharge status: Home / Self Care | Payer: Medicaid Other | Source: Ambulatory Visit | Attending: Neurosurgery | Admitting: Neurosurgery

## 2016-06-09 VITALS — BP 156/82 | HR 82

## 2016-06-09 DIAGNOSIS — M5136 Other intervertebral disc degeneration, lumbar region: Secondary | ICD-10-CM

## 2016-06-09 DIAGNOSIS — M961 Postlaminectomy syndrome, not elsewhere classified: Secondary | ICD-10-CM

## 2016-06-09 MED ORDER — ONDANSETRON HCL 4 MG/2ML IJ SOLN
4.0000 mg | Freq: Once | INTRAMUSCULAR | Status: AC
  Administered 2016-06-09: 4 mg via INTRAMUSCULAR

## 2016-06-09 MED ORDER — IOPAMIDOL (ISOVUE-M 200) INJECTION 41%
15.0000 mL | Freq: Once | INTRAMUSCULAR | Status: AC
  Administered 2016-06-09: 15 mL via INTRATHECAL

## 2016-06-09 MED ORDER — MEPERIDINE HCL 100 MG/ML IJ SOLN
75.0000 mg | Freq: Once | INTRAMUSCULAR | Status: AC
  Administered 2016-06-09: 75 mg via INTRAMUSCULAR

## 2016-06-09 MED ORDER — DIAZEPAM 5 MG PO TABS
10.0000 mg | ORAL_TABLET | Freq: Once | ORAL | Status: AC
  Administered 2016-06-09: 10 mg via ORAL

## 2016-06-09 NOTE — Discharge Instructions (Signed)
Myelogram Discharge Instructions  1. Go home and rest quietly for the next 24 hours.  It is important to lie flat for the next 24 hours.  Get up only to go to the restroom.  You may lie in the bed or on a couch on your back, your stomach, your left side or your right side.  You may have one pillow under your head.  You may have pillows between your knees while you are on your side or under your knees while you are on your back.  2. DO NOT drive today.  Recline the seat as far back as it will go, while still wearing your seat belt, on the way home.  3. You may get up to go to the bathroom as needed.  You may sit up for 10 minutes to eat.  You may resume your normal diet and medications unless otherwise indicated.  Drink lots of extra fluids today and tomorrow.  4. The incidence of headache, nausea, or vomiting is about 5% (one in 20 patients).  If you develop a headache, lie flat and drink plenty of fluids until the headache goes away.  Caffeinated beverages may be helpful.  If you develop severe nausea and vomiting or a headache that does not go away with flat bed rest, call [phone].  5. You may resume normal activities after your 24 hours of bed rest is over; however, do not exert yourself strongly or do any heavy lifting tomorrow. If when you get up you have a headache when standing, go back to bed and force fluids for another 24 hours.  6. Call your physician for a follow-up appointment.  The results of your myelogram will be sent directly to your physician by the following day.  7. If you have any questions or if complications develop after you arrive home, please call [phone].  Discharge instructions have been explained to the patient.  The patient, or the person responsible for the patient, fully understands these instructions.       May resume Cymbalta on Nov. 9, 2017, after 10:00 am.

## 2016-06-09 NOTE — Progress Notes (Signed)
Pt states he has been off Cymbalta for the past 2 days.

## 2016-06-14 ENCOUNTER — Telehealth: Payer: Self-pay

## 2016-06-14 NOTE — Telephone Encounter (Signed)
Spoke with patient after his myelogram here 06/09/16.  He states he is doing fine.  He did develop a headache the day after his procedure when the bedrest was over, but he went back to bed " for a while" and it went away.  jkl

## 2016-06-29 ENCOUNTER — Other Ambulatory Visit: Payer: Self-pay | Admitting: Neurosurgery

## 2016-09-27 ENCOUNTER — Encounter (HOSPITAL_COMMUNITY): Payer: Self-pay

## 2016-09-27 ENCOUNTER — Encounter (HOSPITAL_COMMUNITY): Payer: Self-pay | Admitting: Emergency Medicine

## 2016-09-27 ENCOUNTER — Encounter (HOSPITAL_COMMUNITY)
Admission: RE | Admit: 2016-09-27 | Discharge: 2016-09-27 | Disposition: A | Discharge status: Home / Self Care | Payer: Medicaid Other | Source: Ambulatory Visit | Attending: Neurosurgery | Admitting: Neurosurgery

## 2016-09-27 DIAGNOSIS — Z01810 Encounter for preprocedural cardiovascular examination: Secondary | ICD-10-CM | POA: Diagnosis present

## 2016-09-27 DIAGNOSIS — Z01812 Encounter for preprocedural laboratory examination: Secondary | ICD-10-CM | POA: Diagnosis not present

## 2016-09-27 HISTORY — DX: Other pulmonary collapse: J98.19

## 2016-09-27 LAB — CBC
HCT: 48.1 % (ref 39.0–52.0)
Hemoglobin: 16.1 g/dL (ref 13.0–17.0)
MCH: 30.9 pg (ref 26.0–34.0)
MCHC: 33.5 g/dL (ref 30.0–36.0)
MCV: 92.3 fL (ref 78.0–100.0)
PLATELETS: 257 10*3/uL (ref 150–400)
RBC: 5.21 MIL/uL (ref 4.22–5.81)
RDW: 14 % (ref 11.5–15.5)
WBC: 7.7 10*3/uL (ref 4.0–10.5)

## 2016-09-27 LAB — COMPREHENSIVE METABOLIC PANEL
ALBUMIN: 4.1 g/dL (ref 3.5–5.0)
ALT: 17 U/L (ref 17–63)
ANION GAP: 10 (ref 5–15)
AST: 24 U/L (ref 15–41)
Alkaline Phosphatase: 64 U/L (ref 38–126)
BUN: 12 mg/dL (ref 6–20)
CHLORIDE: 101 mmol/L (ref 101–111)
CO2: 28 mmol/L (ref 22–32)
Calcium: 10.2 mg/dL (ref 8.9–10.3)
Creatinine, Ser: 1.26 mg/dL — ABNORMAL HIGH (ref 0.61–1.24)
GFR calc Af Amer: >60 mL/min (ref >60)
GFR, EST NON AFRICAN AMERICAN: 59 mL/min — AB (ref >60)
GLUCOSE: 100 mg/dL — AB (ref 65–99)
POTASSIUM: 4.4 mmol/L (ref 3.5–5.1)
Sodium: 139 mmol/L (ref 135–145)
Total Bilirubin: 0.4 mg/dL (ref 0.3–1.2)
Total Protein: 7.8 g/dL (ref 6.5–8.1)

## 2016-09-27 LAB — TYPE AND SCREEN
ABO/RH(D): A POS
Antibody Screen: NEGATIVE

## 2016-09-27 LAB — SURGICAL PCR SCREEN
MRSA, PCR: NEGATIVE
STAPHYLOCOCCUS AUREUS: NEGATIVE

## 2016-09-27 NOTE — Pre-Procedure Instructions (Signed)
    [name]  09/27/2016      [facility] ([place]), [place] - [id number]  ([place]) [place] [address] Phone: [phone] Fax: [phone]  [place], [place] - [address] [facility]. [address] [place] [address] Phone: [phone] Fax: [phone]    Your procedure is scheduled on Monday, March 5th   Report to [facility] Admitting at 5:30 AM.             (posted surgery time 7:30 am - 11:31 am)   Call this number if you have problems the [facility] of surgery:  [phone].  Short Stay is closed on weekends.  You may call [phone] during the weekday.   Remember:  Do not eat food or drink liquids after midnight Sunday.   Take these medicines the morning of surgery with A SIP OF WATER : Cymbalta, Robaxin, Oxycodone.              4- 5 days prior to surgery, STOP taking any Vitamins, Herbal Supplements, Anti-inflammatories   Do not wear jewelry - no rings or watches.  Do not wear lotions, colognes, deoderant.             Men may shave face and neck.   Do not bring valuables to the hospital.  [facility] is not responsible for any belongings or valuables.  Contacts, dentures or bridgework may not be worn into surgery.  Leave your suitcase in the car.  After surgery it may be brought to your room.  For patients admitted to the hospital, discharge time will be determined by your treatment team.  Please read over the following fact sheets that you were given. Pain Booklet, MRSA Information and Surgical Site Infection Prevention

## 2016-09-27 NOTE — Progress Notes (Signed)
He denies any cardiac issues or tests. PCP is Dr. Juluis Mire, [place] 06/2016 He does have some issues with his prostate and last surgery.  He was sent to rehab after his last surgery, and had the foley catheter in for 45 days. He could not understand why, and even said no one at the facility could tell him.

## 2016-10-04 ENCOUNTER — Inpatient Hospital Stay (HOSPITAL_COMMUNITY): Admission: RE | Admit: 2016-10-04 | Payer: Medicaid Other | Source: Ambulatory Visit | Admitting: Neurosurgery

## 2016-10-04 ENCOUNTER — Encounter (HOSPITAL_COMMUNITY): Admission: RE | Payer: Self-pay | Source: Ambulatory Visit

## 2016-10-04 SURGERY — POSTERIOR LUMBAR FUSION 1 LEVEL
Anesthesia: General

## 2016-12-07 ENCOUNTER — Other Ambulatory Visit: Payer: Self-pay | Admitting: Neurosurgery

## 2017-01-14 NOTE — Pre-Procedure Instructions (Addendum)
[name]  01/14/2017      [facility] ([place]), [place] [address] [place] ([place]) [place] [address] Phone: [phone] Fax: [phone]  [place], [place] - [address] [facility]. [address] [place] [address] Phone: [phone] Fax: [phone]  [place], [place] - [address] [place] [address] Phone: [phone] Fax: [phone]    Your procedure is scheduled on 01/24/17.  Report to [facility] Admitting at [phone] A.M.  Call this number if you have problems the morning of surgery:  [phone]   Remember:  Do not eat food or drink liquids after midnight.  Take these medicines the morning of surgery with A SIP OF WATER      pain med if needed  STOP all herbel meds, nsaids (aleve,naproxen,advil,ibuprofen) prior to surgery starting today 01/17/17 including all vitamins/supplements, aspirin   Do not wear jewelry, make-up or nail polish.  Do not wear lotions, powders, or perfumes, or deoderant.  Do not shave 48 hours prior to surgery.  Men may shave face and neck.  Do not bring valuables to the hospital.  [name] is not responsible for any belongings or valuables.  Contacts, dentures or bridgework may not be worn into surgery.  Leave your suitcase in the car.  After surgery it may be brought to your room.  For patients admitted to the hospital, discharge time will be determined by your treatment team.  Patients discharged the day of surgery will not be allowed to drive home.    Special instructions: Special Instructions: [name] - Preparing for Surgery  Before surgery, you can play an important role.  Because skin is not sterile, your skin needs to be as free of germs as possible.  You can reduce the number of germs on you skin by washing with CHG (chlorahexidine gluconate) soap before  surgery.  CHG is an antiseptic cleaner which kills germs and bonds with the skin to continue killing germs even after washing.  Please DO NOT use if you have an allergy to CHG or antibacterial soaps.  If your skin becomes reddened/irritated stop using the CHG and inform your nurse when you arrive at Short Stay.  Do not shave (including legs and underarms) for at least 48 hours prior to the first CHG shower.  You may shave your face.  Please follow these instructions carefully:   1.  Shower with CHG Soap the night before surgery and the morning of Surgery.  2.  If you choose to wash your hair, wash your hair first as usual with your normal shampoo.  3.  After you shampoo, rinse your hair and body thoroughly to remove the Shampoo.  4.  Use CHG as you would any other liquid soap.  You can apply chg directly  to the skin and wash gently with scrungie or a clean washcloth.  5.  Apply the CHG Soap to your body ONLY FROM THE NECK DOWN.  Do not use on open wounds or open sores.  Avoid contact with your eyes ears, mouth and genitals (private parts).  Wash genitals (private parts)       with your normal soap.  6.  Wash thoroughly, paying special attention to the area where your surgery will be performed.  7.  Thoroughly rinse your body with warm water from the neck down.  8.  DO NOT shower/wash with your normal soap after using and rinsing off the CHG Soap.  9.  Pat yourself dry with a clean towel.            10.  Wear clean pajamas.            11.  Place clean sheets on your bed the night of your first shower and do not sleep with pets.  Day of Surgery  Do not apply any lotions/deodorants the morning of surgery.  Please wear clean clothes to the hospital/surgery center.    Please read over the fact sheets that you were given.

## 2017-01-17 ENCOUNTER — Encounter (HOSPITAL_COMMUNITY): Payer: Self-pay

## 2017-01-17 ENCOUNTER — Encounter (HOSPITAL_COMMUNITY)
Admission: RE | Admit: 2017-01-17 | Discharge: 2017-01-17 | Disposition: A | Discharge status: Home / Self Care | Payer: Medicaid Other | Source: Ambulatory Visit | Attending: Neurosurgery | Admitting: Neurosurgery

## 2017-01-17 DIAGNOSIS — Z01818 Encounter for other preprocedural examination: Secondary | ICD-10-CM | POA: Insufficient documentation

## 2017-01-17 LAB — COMPREHENSIVE METABOLIC PANEL
ALBUMIN: 4.2 g/dL (ref 3.5–5.0)
ALT: 15 U/L — AB (ref 17–63)
AST: 23 U/L (ref 15–41)
Alkaline Phosphatase: 75 U/L (ref 38–126)
Anion gap: 8 (ref 5–15)
BILIRUBIN TOTAL: 0.6 mg/dL (ref 0.3–1.2)
BUN: 9 mg/dL (ref 6–20)
CO2: 27 mmol/L (ref 22–32)
CREATININE: 1.09 mg/dL (ref 0.61–1.24)
Calcium: 9.8 mg/dL (ref 8.9–10.3)
Chloride: 104 mmol/L (ref 101–111)
GFR calc Af Amer: >60 mL/min (ref >60)
GLUCOSE: 93 mg/dL (ref 65–99)
POTASSIUM: 4.5 mmol/L (ref 3.5–5.1)
Sodium: 139 mmol/L (ref 135–145)
TOTAL PROTEIN: 7.9 g/dL (ref 6.5–8.1)

## 2017-01-17 LAB — TYPE AND SCREEN
ABO/RH(D): A POS
ANTIBODY SCREEN: NEGATIVE

## 2017-01-17 LAB — CBC
HEMATOCRIT: 48.8 % (ref 39.0–52.0)
Hemoglobin: 16.7 g/dL (ref 13.0–17.0)
MCH: 31.5 pg (ref 26.0–34.0)
MCHC: 34.2 g/dL (ref 30.0–36.0)
MCV: 92.1 fL (ref 78.0–100.0)
PLATELETS: 238 10*3/uL (ref 150–400)
RBC: 5.3 MIL/uL (ref 4.22–5.81)
RDW: 13.4 % (ref 11.5–15.5)
WBC: 8.8 10*3/uL (ref 4.0–10.5)

## 2017-01-17 LAB — SURGICAL PCR SCREEN
MRSA, PCR: NEGATIVE
STAPHYLOCOCCUS AUREUS: NEGATIVE

## 2017-01-21 MED ORDER — CEFAZOLIN SODIUM-DEXTROSE 2-4 GM/100ML-% IV SOLN
2.0000 g | INTRAVENOUS | Status: AC
  Administered 2017-01-24: 2 g via INTRAVENOUS
  Filled 2017-01-21: qty 100

## 2017-01-24 ENCOUNTER — Inpatient Hospital Stay (HOSPITAL_COMMUNITY): Payer: Medicaid Other | Admitting: Anesthesiology

## 2017-01-24 ENCOUNTER — Inpatient Hospital Stay (HOSPITAL_COMMUNITY): Payer: Medicaid Other

## 2017-01-24 ENCOUNTER — Encounter (HOSPITAL_COMMUNITY): Payer: Self-pay | Admitting: Certified Registered Nurse Anesthetist

## 2017-01-24 ENCOUNTER — Inpatient Hospital Stay (HOSPITAL_COMMUNITY)
Admission: RE | Admit: 2017-01-24 | Discharge: 2017-01-26 | DRG: 455 | Disposition: A | Discharge status: Skilled Nursing Facility | Payer: Medicaid Other | Source: Ambulatory Visit | Attending: Neurosurgery | Admitting: Neurosurgery

## 2017-01-24 ENCOUNTER — Inpatient Hospital Stay (HOSPITAL_COMMUNITY)
Admission: RE | Disposition: A | Discharge status: Skilled Nursing Facility | Payer: Self-pay | Source: Ambulatory Visit | Attending: Neurosurgery

## 2017-01-24 DIAGNOSIS — Z419 Encounter for procedure for purposes other than remedying health state, unspecified: Secondary | ICD-10-CM

## 2017-01-24 DIAGNOSIS — B182 Chronic viral hepatitis C: Secondary | ICD-10-CM | POA: Diagnosis present

## 2017-01-24 DIAGNOSIS — Z96652 Presence of left artificial knee joint: Secondary | ICD-10-CM | POA: Diagnosis present

## 2017-01-24 DIAGNOSIS — M48062 Spinal stenosis, lumbar region with neurogenic claudication: Principal | ICD-10-CM | POA: Diagnosis present

## 2017-01-24 DIAGNOSIS — F419 Anxiety disorder, unspecified: Secondary | ICD-10-CM | POA: Diagnosis present

## 2017-01-24 DIAGNOSIS — Z79899 Other long term (current) drug therapy: Secondary | ICD-10-CM | POA: Diagnosis not present

## 2017-01-24 DIAGNOSIS — M5126 Other intervertebral disc displacement, lumbar region: Secondary | ICD-10-CM | POA: Diagnosis present

## 2017-01-24 DIAGNOSIS — Z981 Arthrodesis status: Secondary | ICD-10-CM

## 2017-01-24 SURGERY — POSTERIOR LUMBAR FUSION 1 LEVEL
Anesthesia: General | Site: Back

## 2017-01-24 MED ORDER — FENTANYL CITRATE (PF) 100 MCG/2ML IJ SOLN
INTRAMUSCULAR | Status: DC | PRN
  Administered 2017-01-24 (×2): 50 ug via INTRAVENOUS
  Administered 2017-01-24: 100 ug via INTRAVENOUS
  Administered 2017-01-24: 150 ug via INTRAVENOUS

## 2017-01-24 MED ORDER — ONDANSETRON HCL 4 MG/2ML IJ SOLN
INTRAMUSCULAR | Status: DC | PRN
  Administered 2017-01-24: 4 mg via INTRAVENOUS

## 2017-01-24 MED ORDER — FENTANYL CITRATE (PF) 250 MCG/5ML IJ SOLN
INTRAMUSCULAR | Status: AC
  Filled 2017-01-24: qty 5

## 2017-01-24 MED ORDER — CEFAZOLIN SODIUM-DEXTROSE 2-4 GM/100ML-% IV SOLN
2.0000 g | Freq: Three times a day (TID) | INTRAVENOUS | Status: AC
  Administered 2017-01-24 – 2017-01-25 (×2): 2 g via INTRAVENOUS
  Filled 2017-01-24 (×2): qty 100

## 2017-01-24 MED ORDER — 0.9 % SODIUM CHLORIDE (POUR BTL) OPTIME
TOPICAL | Status: DC | PRN
  Administered 2017-01-24: 1000 mL

## 2017-01-24 MED ORDER — HYDROMORPHONE HCL 1 MG/ML IJ SOLN
0.2500 mg | INTRAMUSCULAR | Status: DC | PRN
  Administered 2017-01-24 (×2): 0.5 mg via INTRAVENOUS

## 2017-01-24 MED ORDER — ACETAMINOPHEN 650 MG RE SUPP: 650.0000 mg | RECTAL | Status: DC | PRN

## 2017-01-24 MED ORDER — CYCLOBENZAPRINE HCL 10 MG PO TABS
10.0000 mg | ORAL_TABLET | Freq: Three times a day (TID) | ORAL | Status: DC | PRN
  Administered 2017-01-24 – 2017-01-26 (×4): 10 mg via ORAL
  Filled 2017-01-24 (×4): qty 1

## 2017-01-24 MED ORDER — SUGAMMADEX SODIUM 200 MG/2ML IV SOLN
INTRAVENOUS | Status: DC | PRN
  Administered 2017-01-24: 200 mg via INTRAVENOUS

## 2017-01-24 MED ORDER — FENTANYL CITRATE (PF) 100 MCG/2ML IJ SOLN
INTRAMUSCULAR | Status: AC
  Administered 2017-01-24: 100 ug via INTRAVENOUS
  Filled 2017-01-24: qty 2

## 2017-01-24 MED ORDER — MENTHOL 3 MG MT LOZG: 1.0000 | LOZENGE | OROMUCOSAL | Status: DC | PRN

## 2017-01-24 MED ORDER — CHLORHEXIDINE GLUCONATE CLOTH 2 % EX PADS: 6.0000 | MEDICATED_PAD | Freq: Once | CUTANEOUS | Status: DC

## 2017-01-24 MED ORDER — DIPHENHYDRAMINE HCL 25 MG PO CAPS
25.0000 mg | ORAL_CAPSULE | Freq: Four times a day (QID) | ORAL | Status: DC | PRN
  Administered 2017-01-24 – 2017-01-25 (×2): 25 mg via ORAL
  Filled 2017-01-24 (×2): qty 1

## 2017-01-24 MED ORDER — MIDAZOLAM HCL 2 MG/2ML IJ SOLN
INTRAMUSCULAR | Status: AC
  Filled 2017-01-24: qty 2

## 2017-01-24 MED ORDER — PROMETHAZINE HCL 25 MG/ML IJ SOLN: 6.2500 mg | INTRAMUSCULAR | Status: DC | PRN

## 2017-01-24 MED ORDER — PHENOL 1.4 % MT LIQD: 1.0000 | OROMUCOSAL | Status: DC | PRN

## 2017-01-24 MED ORDER — VANCOMYCIN HCL 1000 MG IV SOLR
INTRAVENOUS | Status: DC | PRN
  Administered 2017-01-24: 1000 mg via TOPICAL

## 2017-01-24 MED ORDER — HYDROMORPHONE HCL 1 MG/ML IJ SOLN
INTRAMUSCULAR | Status: AC
  Filled 2017-01-24: qty 0.5

## 2017-01-24 MED ORDER — SUCCINYLCHOLINE CHLORIDE 200 MG/10ML IV SOSY
PREFILLED_SYRINGE | INTRAVENOUS | Status: AC
  Filled 2017-01-24: qty 10

## 2017-01-24 MED ORDER — SUGAMMADEX SODIUM 200 MG/2ML IV SOLN
INTRAVENOUS | Status: AC
  Filled 2017-01-24: qty 2

## 2017-01-24 MED ORDER — DOCUSATE SODIUM 100 MG PO CAPS
100.0000 mg | ORAL_CAPSULE | Freq: Two times a day (BID) | ORAL | Status: DC
  Administered 2017-01-24 – 2017-01-26 (×4): 100 mg via ORAL
  Filled 2017-01-24 (×4): qty 1

## 2017-01-24 MED ORDER — GELATIN ABSORBABLE MT POWD
OROMUCOSAL | Status: DC | PRN
  Administered 2017-01-24: 5 mL via TOPICAL

## 2017-01-24 MED ORDER — BUPIVACAINE-EPINEPHRINE (PF) 0.5% -1:200000 IJ SOLN
INTRAMUSCULAR | Status: DC | PRN
  Administered 2017-01-24: 10 mL

## 2017-01-24 MED ORDER — LIDOCAINE 2% (20 MG/ML) 5 ML SYRINGE
INTRAMUSCULAR | Status: AC
  Filled 2017-01-24: qty 5

## 2017-01-24 MED ORDER — FENTANYL CITRATE (PF) 250 MCG/5ML IJ SOLN
INTRAMUSCULAR | Status: AC
Start: 2017-01-24 — End: 2017-01-24
  Filled 2017-01-24: qty 5

## 2017-01-24 MED ORDER — PHENYLEPHRINE 40 MCG/ML (10ML) SYRINGE FOR IV PUSH (FOR BLOOD PRESSURE SUPPORT)
PREFILLED_SYRINGE | INTRAVENOUS | Status: AC
  Filled 2017-01-24: qty 10

## 2017-01-24 MED ORDER — ONDANSETRON HCL 4 MG/2ML IJ SOLN: 4.0000 mg | Freq: Four times a day (QID) | INTRAMUSCULAR | Status: DC | PRN

## 2017-01-24 MED ORDER — PHENYLEPHRINE HCL 10 MG/ML IJ SOLN
INTRAVENOUS | Status: DC | PRN
  Administered 2017-01-24: 40 ug/min via INTRAVENOUS

## 2017-01-24 MED ORDER — PROPOFOL 10 MG/ML IV BOLUS
INTRAVENOUS | Status: DC | PRN
  Administered 2017-01-24: 150 mg via INTRAVENOUS

## 2017-01-24 MED ORDER — DEXAMETHASONE SODIUM PHOSPHATE 10 MG/ML IJ SOLN
INTRAMUSCULAR | Status: DC | PRN
  Administered 2017-01-24: 10 mg via INTRAVENOUS

## 2017-01-24 MED ORDER — OXYCODONE HCL 5 MG/5ML PO SOLN: 5.0000 mg | Freq: Once | ORAL | Status: DC | PRN

## 2017-01-24 MED ORDER — BUPIVACAINE LIPOSOME 1.3 % IJ SUSP
INTRAMUSCULAR | Status: DC | PRN
  Administered 2017-01-24: 20 mL

## 2017-01-24 MED ORDER — OXYCODONE HCL 5 MG PO TABS: 5.0000 mg | ORAL_TABLET | Freq: Once | ORAL | Status: DC | PRN

## 2017-01-24 MED ORDER — MORPHINE SULFATE (PF) 4 MG/ML IV SOLN: 4.0000 mg | INTRAVENOUS | Status: DC | PRN

## 2017-01-24 MED ORDER — SODIUM CHLORIDE 0.9% FLUSH
3.0000 mL | Freq: Two times a day (BID) | INTRAVENOUS | Status: DC
  Administered 2017-01-24: 3 mL via INTRAVENOUS

## 2017-01-24 MED ORDER — BACITRACIN ZINC 500 UNIT/GM EX OINT
TOPICAL_OINTMENT | CUTANEOUS | Status: DC | PRN
  Administered 2017-01-24: 1 via TOPICAL

## 2017-01-24 MED ORDER — BISACODYL 10 MG RE SUPP: 10.0000 mg | Freq: Every day | RECTAL | Status: DC | PRN

## 2017-01-24 MED ORDER — ONDANSETRON HCL 4 MG/2ML IJ SOLN
INTRAMUSCULAR | Status: AC
  Filled 2017-01-24: qty 2

## 2017-01-24 MED ORDER — BACITRACIN ZINC 500 UNIT/GM EX OINT
TOPICAL_OINTMENT | CUTANEOUS | Status: AC
  Filled 2017-01-24: qty 28.35

## 2017-01-24 MED ORDER — ROCURONIUM BROMIDE 10 MG/ML (PF) SYRINGE
PREFILLED_SYRINGE | INTRAVENOUS | Status: AC
  Filled 2017-01-24: qty 5

## 2017-01-24 MED ORDER — THROMBIN 5000 UNITS EX SOLR
CUTANEOUS | Status: AC
  Filled 2017-01-24: qty 5000

## 2017-01-24 MED ORDER — PHENYLEPHRINE HCL 10 MG/ML IJ SOLN
INTRAMUSCULAR | Status: DC | PRN
  Administered 2017-01-24: 80 ug via INTRAVENOUS

## 2017-01-24 MED ORDER — SODIUM CHLORIDE 0.9% FLUSH: 3.0000 mL | INTRAVENOUS | Status: DC | PRN

## 2017-01-24 MED ORDER — LACTATED RINGERS IV SOLN
INTRAVENOUS | Status: DC
  Administered 2017-01-24 (×3): via INTRAVENOUS

## 2017-01-24 MED ORDER — SODIUM CHLORIDE 0.9 % IR SOLN
Status: DC | PRN
  Administered 2017-01-24: 500 mL

## 2017-01-24 MED ORDER — ACETAMINOPHEN 325 MG PO TABS
650.0000 mg | ORAL_TABLET | ORAL | Status: DC | PRN
  Administered 2017-01-25: 650 mg via ORAL
  Filled 2017-01-24: qty 2

## 2017-01-24 MED ORDER — THROMBIN 20000 UNITS EX SOLR
CUTANEOUS | Status: DC | PRN
  Administered 2017-01-24: 20 mL via TOPICAL

## 2017-01-24 MED ORDER — THROMBIN 20000 UNITS EX SOLR
CUTANEOUS | Status: AC
  Filled 2017-01-24: qty 20000

## 2017-01-24 MED ORDER — BUPIVACAINE LIPOSOME 1.3 % IJ SUSP
20.0000 mL | INTRAMUSCULAR | Status: DC
  Filled 2017-01-24: qty 20

## 2017-01-24 MED ORDER — BUPIVACAINE-EPINEPHRINE (PF) 0.5% -1:200000 IJ SOLN
INTRAMUSCULAR | Status: AC
  Filled 2017-01-24: qty 30

## 2017-01-24 MED ORDER — SODIUM CHLORIDE 0.9 % IV SOLN
250.0000 mL | INTRAVENOUS | Status: DC
  Administered 2017-01-24: 250 mL via INTRAVENOUS

## 2017-01-24 MED ORDER — VANCOMYCIN HCL 1000 MG IV SOLR
INTRAVENOUS | Status: AC
  Filled 2017-01-24: qty 1000

## 2017-01-24 MED ORDER — ONDANSETRON HCL 4 MG PO TABS: 4.0000 mg | ORAL_TABLET | Freq: Four times a day (QID) | ORAL | Status: DC | PRN

## 2017-01-24 MED ORDER — PROPOFOL 10 MG/ML IV BOLUS
INTRAVENOUS | Status: AC
  Filled 2017-01-24: qty 20

## 2017-01-24 MED ORDER — LIDOCAINE HCL (CARDIAC) 20 MG/ML IV SOLN
INTRAVENOUS | Status: DC | PRN
  Administered 2017-01-24: 60 mg via INTRAVENOUS

## 2017-01-24 MED ORDER — CYCLOBENZAPRINE HCL 10 MG PO TABS: 10.0000 mg | ORAL_TABLET | Freq: Three times a day (TID) | ORAL | Status: DC | PRN

## 2017-01-24 MED ORDER — ROCURONIUM BROMIDE 100 MG/10ML IV SOLN
INTRAVENOUS | Status: DC | PRN
  Administered 2017-01-24 (×2): 20 mg via INTRAVENOUS
  Administered 2017-01-24: 50 mg via INTRAVENOUS
  Administered 2017-01-24: 10 mg via INTRAVENOUS
  Administered 2017-01-24: 20 mg via INTRAVENOUS

## 2017-01-24 MED ORDER — FENTANYL CITRATE (PF) 100 MCG/2ML IJ SOLN
100.0000 ug | Freq: Once | INTRAMUSCULAR | Status: AC
  Administered 2017-01-24: 100 ug via INTRAVENOUS
  Filled 2017-01-24: qty 2

## 2017-01-24 MED ORDER — OXYCODONE HCL 5 MG PO TABS
15.0000 mg | ORAL_TABLET | ORAL | Status: DC | PRN
  Administered 2017-01-24 – 2017-01-26 (×10): 15 mg via ORAL
  Filled 2017-01-24 (×10): qty 3

## 2017-01-24 MED ORDER — MIDAZOLAM HCL 5 MG/5ML IJ SOLN
INTRAMUSCULAR | Status: DC | PRN
  Administered 2017-01-24: 2 mg via INTRAVENOUS

## 2017-01-24 SURGICAL SUPPLY — 77 items
BAG DECANTER FOR FLEXI CONT (MISCELLANEOUS) ×3 IMPLANT
BENZOIN TINCTURE PRP APPL 2/3 (GAUZE/BANDAGES/DRESSINGS) ×3 IMPLANT
BLADE CLIPPER SURG (BLADE) IMPLANT
BUR MATCHSTICK NEURO 3.0 LAGG (BURR) ×3 IMPLANT
BUR PRECISION FLUTE 6.0 (BURR) ×3 IMPLANT
CAGE ALTERA 10X31X9-13 15D (Cage) ×2 IMPLANT
CAGE ALTERA 9-13-15-31MM (Cage) ×1 IMPLANT
CANISTER SUCT 3000ML PPV (MISCELLANEOUS) ×3 IMPLANT
CAP REVERE LOCKING (Cap) ×24 IMPLANT
CARTRIDGE OIL MAESTRO DRILL (MISCELLANEOUS) ×1 IMPLANT
CLOSURE WOUND 1/2 X4 (GAUZE/BANDAGES/DRESSINGS) ×1
CONN CROSSLINK REV 6.35 48-60 (Connector) ×3 IMPLANT
CONNECTOR CRSLNK REV6.35 48-60 (Connector) ×1 IMPLANT
CONT SPEC 4OZ CLIKSEAL STRL BL (MISCELLANEOUS) ×3 IMPLANT
COVER BACK TABLE 60X90IN (DRAPES) ×6 IMPLANT
DIFFUSER DRILL AIR PNEUMATIC (MISCELLANEOUS) ×3 IMPLANT
DRAPE C-ARM 42X72 X-RAY (DRAPES) ×6 IMPLANT
DRAPE HALF SHEET 40X57 (DRAPES) ×3 IMPLANT
DRAPE LAPAROTOMY 100X72X124 (DRAPES) ×3 IMPLANT
DRAPE POUCH INSTRU U-SHP 10X18 (DRAPES) ×3 IMPLANT
DRAPE SURG 17X23 STRL (DRAPES) ×12 IMPLANT
ELECT BLADE 4.0 EZ CLEAN MEGAD (MISCELLANEOUS) ×3
ELECT REM PT RETURN 9FT ADLT (ELECTROSURGICAL) ×3
ELECTRODE BLDE 4.0 EZ CLN MEGD (MISCELLANEOUS) ×1 IMPLANT
ELECTRODE REM PT RTRN 9FT ADLT (ELECTROSURGICAL) ×1 IMPLANT
EVACUATOR 1/8 PVC DRAIN (DRAIN) IMPLANT
GAUZE SPONGE 4X4 12PLY STRL (GAUZE/BANDAGES/DRESSINGS) ×3 IMPLANT
GAUZE SPONGE 4X4 12PLY STRL LF (GAUZE/BANDAGES/DRESSINGS) ×3 IMPLANT
GAUZE SPONGE 4X4 16PLY XRAY LF (GAUZE/BANDAGES/DRESSINGS) ×3 IMPLANT
GLOVE BIO SURGEON STRL SZ8 (GLOVE) ×6 IMPLANT
GLOVE BIO SURGEON STRL SZ8.5 (GLOVE) ×6 IMPLANT
GLOVE BIOGEL PI IND STRL 6.5 (GLOVE) ×1 IMPLANT
GLOVE BIOGEL PI IND STRL 7.5 (GLOVE) ×2 IMPLANT
GLOVE BIOGEL PI IND STRL 8 (GLOVE) ×1 IMPLANT
GLOVE BIOGEL PI INDICATOR 6.5 (GLOVE) ×2
GLOVE BIOGEL PI INDICATOR 7.5 (GLOVE) ×4
GLOVE BIOGEL PI INDICATOR 8 (GLOVE) ×2
GLOVE ECLIPSE 7.0 STRL STRAW (GLOVE) ×3 IMPLANT
GLOVE ECLIPSE 7.5 STRL STRAW (GLOVE) ×3 IMPLANT
GLOVE ECLIPSE 9.0 STRL (GLOVE) ×3 IMPLANT
GLOVE EXAM NITRILE LRG STRL (GLOVE) IMPLANT
GLOVE EXAM NITRILE XL STR (GLOVE) IMPLANT
GLOVE EXAM NITRILE XS STR PU (GLOVE) IMPLANT
GLOVE SURG SS PI 6.5 STRL IVOR (GLOVE) ×6 IMPLANT
GLOVE SURG SS PI 7.0 STRL IVOR (GLOVE) ×3 IMPLANT
GOWN STRL REUS W/ TWL LRG LVL3 (GOWN DISPOSABLE) ×1 IMPLANT
GOWN STRL REUS W/ TWL XL LVL3 (GOWN DISPOSABLE) ×3 IMPLANT
GOWN STRL REUS W/TWL 2XL LVL3 (GOWN DISPOSABLE) ×6 IMPLANT
GOWN STRL REUS W/TWL LRG LVL3 (GOWN DISPOSABLE) ×2
GOWN STRL REUS W/TWL XL LVL3 (GOWN DISPOSABLE) ×9
HEMOSTAT POWDER KIT SURGIFOAM (HEMOSTASIS) ×3 IMPLANT
KIT BASIN OR (CUSTOM PROCEDURE TRAY) ×3 IMPLANT
KIT ROOM TURNOVER OR (KITS) ×3 IMPLANT
NEEDLE HYPO 21X1.5 SAFETY (NEEDLE) IMPLANT
NEEDLE HYPO 22GX1.5 SAFETY (NEEDLE) ×3 IMPLANT
NS IRRIG 1000ML POUR BTL (IV SOLUTION) ×3 IMPLANT
OIL CARTRIDGE MAESTRO DRILL (MISCELLANEOUS) ×3
PACK LAMINECTOMY NEURO (CUSTOM PROCEDURE TRAY) ×3 IMPLANT
PAD ARMBOARD 7.5X6 YLW CONV (MISCELLANEOUS) ×9 IMPLANT
PATTIES SURGICAL .5 X1 (DISPOSABLE) IMPLANT
ROD CURVED 100MM (Rod) ×6 IMPLANT
SCREW REVERE 6.35 75X55MM (Screw) ×6 IMPLANT
SLEEVE SURGEON STRL (DRAPES) ×3 IMPLANT
SPACER ALTERA 10X31 8-12MM-8 (Spacer) IMPLANT
SPONGE LAP 4X18 X RAY DECT (DISPOSABLE) IMPLANT
SPONGE NEURO XRAY DETECT 1X3 (DISPOSABLE) IMPLANT
SPONGE SURGIFOAM ABS GEL 100 (HEMOSTASIS) ×3 IMPLANT
STRIP BIOACTIVE 20CC 25X100X8 (Miscellaneous) ×3 IMPLANT
STRIP CLOSURE SKIN 1/2X4 (GAUZE/BANDAGES/DRESSINGS) ×2 IMPLANT
SUT VIC AB 1 CT1 18XBRD ANBCTR (SUTURE) ×2 IMPLANT
SUT VIC AB 1 CT1 8-18 (SUTURE) ×6
SUT VIC AB 2-0 CP2 18 (SUTURE) ×6 IMPLANT
TAPE CLOTH SURG 4X10 WHT LF (GAUZE/BANDAGES/DRESSINGS) ×3 IMPLANT
TOWEL GREEN STERILE (TOWEL DISPOSABLE) ×3 IMPLANT
TOWEL GREEN STERILE FF (TOWEL DISPOSABLE) ×3 IMPLANT
TRAY FOLEY W/METER SILVER 16FR (SET/KITS/TRAYS/PACK) ×3 IMPLANT
WATER STERILE IRR 1000ML POUR (IV SOLUTION) ×3 IMPLANT
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
## 2017-01-24 NOTE — Anesthesia Postprocedure Evaluation (Signed)
Anesthesia Post Note  Patient: [name]  Procedure(s) Performed: Procedure(s) (LRB): POSTERIOR LUMBAR INTERBODY FUSION,INTERBODY PROSTHESIS,POSTERIOR LATERAL ARTHRODESIS,POSTERIOR SEGMENTAL INSTRUMENTATION, EXPLORE OLD FUSION LUMBAR [place] (N/A)     Patient location during evaluation: PACU Anesthesia Type: General Level of consciousness: awake and alert Pain management: pain level controlled Vital Signs Assessment: post-procedure vital signs reviewed and stable Respiratory status: spontaneous breathing, nonlabored ventilation, respiratory function stable and patient connected to nasal cannula oxygen Cardiovascular status: blood pressure returned to baseline and stable Postop Assessment: no signs of nausea or vomiting Anesthetic complications: no    Last Vitals:  Vitals:   01/24/17 1805 01/24/17 1850  BP: (!) 154/93 (!) 163/93  Pulse: 71 80  Resp: 12 16  Temp: 36.7 C 36.9 C    Last Pain:  Vitals:   01/24/17 1750  TempSrc:   PainSc: Asleep                 Sreeja Spies P Austine Wiedeman

## 2017-01-24 NOTE — Op Note (Signed)
I forgot to mention that because of the angle of the head of the right  S1 pedicle screw , I was unable to place a cap on that screw.

## 2017-01-24 NOTE — Anesthesia Procedure Notes (Signed)
Procedure Name: Intubation Date/Time: 01/24/2017 12:42 PM Performed by: Izora Gala Pre-anesthesia Checklist: Patient identified, Emergency Drugs available, Suction available and Patient being monitored Patient Re-evaluated:Patient Re-evaluated prior to inductionOxygen Delivery Method: Circle system utilized Preoxygenation: Pre-oxygenation with 100% oxygen Intubation Type: IV induction Ventilation: Mask ventilation without difficulty Laryngoscope Size: Miller and 3 Grade View: Grade I Tube type: Oral Tube size: 8.0 mm Number of attempts: 1 Airway Equipment and Method: Stylet Placement Confirmation: ETT inserted through vocal cords under direct vision,  positive ETCO2 and breath sounds checked- equal and bilateral

## 2017-01-24 NOTE — Transfer of Care (Signed)
Immediate Anesthesia Transfer of Care Note  Patient: [name]  Procedure(s) Performed: Procedure(s): POSTERIOR LUMBAR INTERBODY FUSION,INTERBODY PROSTHESIS,POSTERIOR LATERAL ARTHRODESIS,POSTERIOR SEGMENTAL INSTRUMENTATION, EXPLORE OLD FUSION LUMBAR [place] (N/A)  Patient Location: PACU  Anesthesia Type:General  Level of Consciousness: awake, alert , oriented and patient cooperative  Airway & Oxygen Therapy: Patient Spontanous Breathing and Patient connected to face mask oxygen  Post-op Assessment: Report given to RN, Post -op Vital signs reviewed and stable and Patient moving all extremities X 4  Post vital signs: Reviewed and stable  Last Vitals:  Vitals:   01/24/17 0959  BP: 125/77  Pulse: 77  Resp: 20  Temp: 37.2 C    Last Pain:  Vitals:   01/24/17 1223  TempSrc:   PainSc: 9       Patients Stated Pain Goal: 3 (44/46/19 0122)  Complications: No apparent anesthesia complications

## 2017-01-24 NOTE — Anesthesia Procedure Notes (Signed)
Procedure Name: Intubation Date/Time: 01/24/2017 12:42 PM Performed by: Izora Gala Pre-anesthesia Checklist: Patient identified, Emergency Drugs available, Patient being monitored and Suction available Patient Re-evaluated:Patient Re-evaluated prior to inductionOxygen Delivery Method: Circle system utilized Preoxygenation: Pre-oxygenation with 100% oxygen Intubation Type: IV induction Ventilation: Mask ventilation without difficulty Laryngoscope Size: Miller and 3 Grade View: Grade I Tube type: Oral Tube size: 8.0 mm Number of attempts: 1 Placement Confirmation: positive ETCO2,  ETT inserted through vocal cords under direct vision and breath sounds checked- equal and bilateral Secured at: 22 cm Tube secured with: Tape Dental Injury: Teeth and Oropharynx as per pre-operative assessment

## 2017-01-24 NOTE — Op Note (Signed)
Brief history: The patient is a 66 year old black male on whom I previously performed an L4-5 and L5-S1 decompression, instrumentation, and fusion. The patient has developed recurrent back and left greater than right leg pain consistent with neurogenic claudication. He has failed medical management. He was worked up with a lumbar myelo CT which demonstrated severe stenosis at L3-4. I discussed the various treatment options with the patient including surgery. He has weighed the risks, benefits, and alternatives to surgery and decided proceed with an exploration of his lumbar fusion with an L3-4 decompression, instrumentation, and fusion.  Preoperative diagnosis: L3-4 herniated disc, spinal stenosis compressing both the L3 and the L4 nerve roots; lumbago; lumbar radiculopathy; neurogenic claudication  Postoperative diagnosis: The same  Procedure: Bilateral L3-4 Laminotomy/foraminotomies to decompress the bilateral L3 and L4 nerve roots(the work required to do this was in addition to the work required to do the posterior lumbar interbody fusion because of the patient's spinal stenosis, facet arthropathy. Etc. requiring a wide decompression of the nerve roots.); L3-4 transforaminal lumbar interbody fusion with local morselized autograft bone and Kinnex graft extender; insertion of interbody prosthesis at L3-4 (globus peek expandable interbody prosthesis); posterior segmental instrumentation from L3-S1 with globus titanium pedicle screws and rods; posterior lateral arthrodesis at L3-4 with local morselized autograft bone and Kinnex bone graft extender.  Surgeon: Dr. Earle Gell  Asst.: Dr. Annette Stable  Anesthesia: Gen. endotracheal  Estimated blood loss: 200 mL  Drains: None  Complications: None  Description of procedure: The patient was brought to the operating room by the anesthesia team. General endotracheal anesthesia was induced. The patient was turned to the prone position on the Wilson frame. The  patient's lumbosacral region was then prepared with Betadine scrub and Betadine solution. Sterile drapes were applied.  I then injected the area to be incised with Marcaine with epinephrine solution. I then used the scalpel to make a linear midline incision over the L3-4, L4-5 and L5-S1 interspace. I then used electrocautery to perform a bilateral subperiosteal dissection exposing the spinous process and lamina of L3 and L4, and to expose the old hardware from L4 to the sacrum bilaterally.  We then inserted the Verstrac retractor to provide exposure.  I explored the fusion by removing the caps from the old pedicle screws from L4 to the sacrum bilaterally. We then removed the cross connectors and the rods. We inspected the arthrodesis at L4-5 and L5-S1. It appeared solid.  I began the decompression by using the high speed drill to perform laminotomies at L3-4 bilaterally. We then used the Kerrison punches to widen the laminotomy and removed the ligamentum flavum at L3-4 bilaterally. We used the Kerrison punches to remove the medial facets at L3-4 bilaterally. We performed wide foraminotomies about the bilateral L3 and L4 nerve roots completing the decompression.  We now turned our attention to the posterior lumbar interbody fusion. I used a scalpel to incise the intervertebral disc at L3-4 bilaterally. I then performed a partial intervertebral discectomy at L3-4 bilaterally using the pituitary forceps. We prepared the vertebral endplates at G5-0 bilaterally for the fusion by removing the soft tissues with the curettes. We then used the trial spacers to pick the appropriate sized interbody prosthesis. We prefilled his prosthesis with a combination of local morselized autograft bone that we obtained during the decompression as well as Kinnex bone graft extender. We inserted the prefilled prosthesis into the interspace at L3-4, we then expanded the prosthesis. There was a good snug fit of the prosthesis in the  interspace. We then filled and the remainder of the intervertebral disc space with local morselized autograft bone and Kinnex. This completed the posterior lumbar interbody arthrodesis.  We now turned attention to the instrumentation. Under fluoroscopic guidance we cannulated the bilateral L3 pedicles with the bone probe. We then removed the bone probe. We then tapped the pedicle with a 6.5 millimeter tap. We then removed the tap. We probed inside the tapped pedicle with a ball probe to rule out cortical breaches. We then inserted a 7.5 x 55 millimeter pedicle screw into the L3 pedicles bilaterally under fluoroscopic guidance. We then palpated along the medial aspect of the pedicles to rule out cortical breaches. There were none. The nerve roots were not injured. We then connected the unilateral pedicle screws from L3 to the sacrum bilaterally with a lordotic rod. We compressed the construct and secured the rod in place with the caps. We placed a cross connector between the rods. We then tightened the caps appropriately. This completed the instrumentation from L3 to the sacrum.  We now turned our attention to the posterior lateral arthrodesis at L3-4 bilaterally. We used the high-speed drill to decorticate the remainder of the facets, pars, transverse process at L3-4 bilaterally. We then applied a combination of local morselized autograft bone and Kinnex bone graft extender over these decorticated posterior lateral structures. This completed the posterior lateral arthrodesis.  We then obtained hemostasis using bipolar electrocautery. We irrigated the wound out with bacitracin solution. We inspected the thecal sac and nerve roots and noted they were well decompressed. We then removed the retractor. We placed vancomycin powder in the wound. We reapproximated patient's thoracolumbar fascia with interrupted #1 Vicryl suture. We reapproximated patient's subcutaneous tissue with interrupted 2-0 Vicryl suture. The  reapproximated patient's skin with Steri-Strips and benzoin. The wound was then coated with bacitracin ointment. A sterile dressing was applied. The drapes were removed. The patient was subsequently returned to the supine position where they were extubated by the anesthesia team. He was then transported to the post anesthesia care unit in stable condition. All sponge instrument and needle counts were reportedly correct at the end of this case.

## 2017-01-25 LAB — CBC
HEMATOCRIT: 42.2 % (ref 39.0–52.0)
Hemoglobin: 14.1 g/dL (ref 13.0–17.0)
MCH: 30.7 pg (ref 26.0–34.0)
MCHC: 33.4 g/dL (ref 30.0–36.0)
MCV: 91.9 fL (ref 78.0–100.0)
PLATELETS: 234 10*3/uL (ref 150–400)
RBC: 4.59 MIL/uL (ref 4.22–5.81)
RDW: 13.3 % (ref 11.5–15.5)
WBC: 12.6 10*3/uL — AB (ref 4.0–10.5)

## 2017-01-25 LAB — BASIC METABOLIC PANEL
ANION GAP: 7 (ref 5–15)
BUN: 10 mg/dL (ref 6–20)
CO2: 29 mmol/L (ref 22–32)
Calcium: 9.5 mg/dL (ref 8.9–10.3)
Chloride: 101 mmol/L (ref 101–111)
Creatinine, Ser: 1.11 mg/dL (ref 0.61–1.24)
Glucose, Bld: 132 mg/dL — ABNORMAL HIGH (ref 65–99)
Potassium: 5.3 mmol/L — ABNORMAL HIGH (ref 3.5–5.1)
SODIUM: 137 mmol/L (ref 135–145)

## 2017-01-25 MED ORDER — TAMSULOSIN HCL 0.4 MG PO CAPS
0.4000 mg | ORAL_CAPSULE | Freq: Every day | ORAL | Status: DC
  Administered 2017-01-26: 0.4 mg via ORAL
  Filled 2017-01-25: qty 1

## 2017-01-25 MED ORDER — TAMSULOSIN HCL 0.4 MG PO CAPS
0.8000 mg | ORAL_CAPSULE | Freq: Once | ORAL | Status: AC
  Administered 2017-01-25: 0.8 mg via ORAL
  Filled 2017-01-25: qty 2

## 2017-01-25 MED FILL — Sodium Chloride IV Soln 0.9%: INTRAVENOUS | Qty: 1000 | Status: AC

## 2017-01-25 MED FILL — Heparin Sodium (Porcine) Inj 1000 Unit/ML: INTRAMUSCULAR | Qty: 30 | Status: AC

## 2017-01-25 NOTE — Progress Notes (Signed)
Patient ID: [name], male   DOB: 01/18/1951, 66 y.o.   MRN: [id number] Subjective:  The patient is alert and pleasant. He looks and feels well. His pain is already much better. He has ambulated the halls well. His weakness has completely resolved.  Objective: Vital signs in last 24 hours: Temp:  [97.7 F (36.5 C)-98.9 F (37.2 C)] 97.7 F (36.5 C) (06/26 0400) Pulse Rate:  [71-95] 71 (06/26 0400) Resp:  [12-27] 17 (06/26 0400) BP: (125-166)/(75-106) 131/75 (06/26 0400) SpO2:  [96 %-100 %] 97 % (06/26 0400) Weight:  [82.6 kg (182 lb)] 82.6 kg (182 lb) (06/25 0959)  Intake/Output from previous day: 06/25 0701 - 06/26 0700 In: 3201.2 [P.O.:640; I.V.:2461.2; IV Piggyback:100] Out: 2775 [Urine:2475; Blood:300] Intake/Output this shift: No intake/output data recorded.  Physical exam the patient is alert and oriented. His dressing has a small bloodstained. His strength is grossly normal his bilateral lower extremities.  Lab Results:  Recent Labs  01/25/17 0430  WBC 12.6*  HGB 14.1  HCT 42.2  PLT 234   BMET  Recent Labs  01/25/17 0430  NA 137  K 5.3*  CL 101  CO2 29  GLUCOSE 132*  BUN 10  CREATININE 1.11  CALCIUM 9.5    Studies/Results: Dg Lumbar Spine 2-3 Views  Result Date: 01/24/2017 CLINICAL DATA:  L3-4 fusion.  Prior L4-5 and L5-S1 fusion. EXAM: Operative LUMBAR SPINE - 2-3 VIEW COMPARISON:  Lumbar CT myelogram 06/09/2016. Intraoperative lumbar spine images 03/05/2015. FINDINGS: AP and lateral images of the lumbar spine obtained with the C-arm fluoroscopic device are submitted for interpretation postoperatively. Interbody fusion prosthesis is appropriately positioned in the L3-4 disc space. Bilateral pedicle screws are present at L3. The mild compression fracture of the upper endplate of L3 is again noted. The radiologic technologist documented 14 seconds of fluoroscopy time. IMPRESSION: Images obtained during L3-4 fusion demonstrating bilateral L3 pedicle screws  and an interbody fusion prosthesis appropriately positioned in the L3-4 disc space. Electronically Signed   By: Evangeline Dakin M.D.   On: 01/24/2017 16:05   Dg C-arm 1-60 Min  Result Date: 01/24/2017 CLINICAL DATA:  L3-4 fusion.  Prior L4-5 and L5-S1 fusion. EXAM: Operative LUMBAR SPINE - 2-3 VIEW COMPARISON:  Lumbar CT myelogram 06/09/2016. Intraoperative lumbar spine images 03/05/2015. FINDINGS: AP and lateral images of the lumbar spine obtained with the C-arm fluoroscopic device are submitted for interpretation postoperatively. Interbody fusion prosthesis is appropriately positioned in the L3-4 disc space. Bilateral pedicle screws are present at L3. The mild compression fracture of the upper endplate of L3 is again noted. The radiologic technologist documented 14 seconds of fluoroscopy time. IMPRESSION: Images obtained during L3-4 fusion demonstrating bilateral L3 pedicle screws and an interbody fusion prosthesis appropriately positioned in the L3-4 disc space. Electronically Signed   By: Evangeline Dakin M.D.   On: 01/24/2017 16:05    Assessment/Plan: Postop day #1: The patient is doing well. He is interested in skilled nursing facility placement for further rehabilitation. I'll ask care management to work on this.  LOS: 1 day     Guinevere Stephenson D 01/25/2017, 7:35 AM

## 2017-01-25 NOTE — Clinical Social Work Note (Signed)
Clinical Social Work Assessment  Patient Details  Name: [name] MRN: [id number] Date of Birth: [birth year]  Date of referral:  01/25/17               Reason for consult:  Facility Placement                Permission sought to share information with:  Facility Art therapist granted to share information::  Yes, Verbal Permission Granted  Name::     Jahred Tatar  Agency::  SNFs  Relationship::  Daughter   Contact Information:  [phone]  Housing/Transportation Living arrangements for the past 2 months:  Single Family Home Source of Information:  Patient Patient Interpreter Needed:  None Criminal Activity/Legal Involvement Pertinent to Current Situation/Hospitalization:  No - Comment as needed Significant Relationships:  Adult Children, Friend Lives with:  Friends Do you feel safe going back to the place where you live?  No Need for family participation in patient care:  No (Coment)  Care giving concerns: Patient was living in his mother's house after she passed away, but the house was foreclosed. Patient then started staying with a friend but does not feel comfortable going back to friend's house after discharge. PT recommends SNF.   Social Worker assessment / plan: CSW met with patient at bedside. Patient is agreeable to SNF placement. Patient explained living situation and requested assistance with permanent housing for when he is discharged from SNF. CSW will send out SNF referrals and provide patient with housing resources.  Employment status:  Retired Forensic scientist:  Medicaid In [place] PT Recommendations:  [place] / Referral to community resources:  [place]  Patient/Family's Response to care: Patient understanding of CSW role and discharge process. Patient appreciative of care.  Patient/Family's Understanding of and Emotional Response to Diagnosis, Current Treatment, and Prognosis: Patient with  appropriate and pleasant affect. Patient understanding of his medical conditions and hopeful for mobility recovery in SNF. Patient indicated he does not have many friends and family around due to history of substance use.  Emotional Assessment Appearance:  Appears stated age Attitude/Demeanor/Rapport:  Other (appropriate) Affect (typically observed):  Pleasant, Calm Orientation:  Oriented to Self, Oriented to Place, Oriented to  Time, Oriented to Situation Alcohol / Substance use:  Not Applicable Psych involvement (Current and /or in the community):  No (Comment)  Discharge Needs  Concerns to be addressed:  Discharge Planning Concerns Readmission within the last 30 days:  No Current discharge risk:  Homeless, Dependent with Mobility Barriers to Discharge:  Continued Medical Work up   [employer], LCSW 01/25/2017, 11:07 AM

## 2017-01-25 NOTE — Evaluation (Signed)
Physical Therapy Evaluation Patient Details Name: [name] MRN: [id number] DOB: 25-Dec-1950 Today's Date: 01/25/2017   History of Present Illness  Pt is a 66 y/o male who presents s/p L3-L4 TLIF on 01/24/17.  Clinical Impression  Pt admitted with above diagnosis. Pt currently with functional limitations due to the deficits listed below (see PT Problem List). At the time of PT eval pt was able to perform transfers and ambulation with close, hands-on guarding for balance support and safety with the RW. Pt reports to this therapist that he is essentially homeless and does not have a reliable residence to d/c to. Feel he would benefit from continued rehab at the SNF level to maximize functional independence and return to PLOF. Pt will benefit from skilled PT to increase their independence and safety with mobility to allow discharge to the venue listed below.       Follow Up Recommendations SNF;Supervision/Assistance - 24 hour    Equipment Recommendations  Rolling walker with 5" wheels;3in1 (PT)    Recommendations for Other Services       Precautions / Restrictions Precautions Precautions: Fall;Back Precaution Booklet Issued: Yes (comment) Precaution Comments: Reviewed handout in detail. Pt was cued for precautions during functional mobility.  Required Braces or Orthoses: Spinal Brace Spinal Brace: Lumbar corset;Applied in sitting position Restrictions Weight Bearing Restrictions: No      Mobility  Bed Mobility Overal bed mobility: Needs Assistance Bed Mobility: Rolling;Sidelying to Sit Rolling: Supervision Sidelying to sit: Min guard       General bed mobility comments: Close guard for safety as pt transitioned to EOB. VC's for maintenance of back precautions.   Transfers Overall transfer level: Needs assistance   Transfers: Sit to/from Stand Sit to Stand: Min guard         General transfer comment: Close guard for safety as pt powered up to full standing position. Pt  practiced x3 during session for technique.   Ambulation/Gait Ambulation/Gait assistance: Min guard Ambulation Distance (Feet): 200 Feet Assistive device: Rolling walker (2 wheeled) Gait Pattern/deviations: Step-through pattern;Decreased stride length;Trunk flexed Gait velocity: Decreased Gait velocity interpretation: Below normal speed for age/gender General Gait Details: VC's for improved posture and general safety/positioning with the RW. Pt with grossly flexed knees and had difficulty maintaining knee extension in standing. Was able to improve posture for short bouts but unable to maintain.   Stairs            Wheelchair Mobility    Modified Rankin (Stroke Patients Only)       Balance Overall balance assessment: Needs assistance Sitting-balance support: Feet supported;No upper extremity supported Sitting balance-Leahy Scale: Good     Standing balance support: Bilateral upper extremity supported;During functional activity Standing balance-Leahy Scale: Poor Standing balance comment: Requires UE support for safe dynamic standing balance                             Pertinent Vitals/Pain Pain Assessment: Faces Faces Pain Scale: Hurts little more Pain Location: Incision site Pain Descriptors / Indicators: Operative site guarding;Aching Pain Intervention(s): Limited activity within patient's tolerance;Monitored during session;Repositioned    Home Living Family/patient expects to be discharged to:: Skilled nursing facility Living Arrangements: Alone               Additional Comments: Pt reports he is currently homeless and sleeping on a friends couch.     Prior Function Level of Independence: Independent  Comments: Pt reports he was independent PTA however was having difficulties managing pain as he was sleeping on a friends couch.      Hand Dominance   Dominant Hand: Right    Extremity/Trunk Assessment   Upper Extremity  Assessment Upper Extremity Assessment: Overall WFL for tasks assessed    Lower Extremity Assessment Lower Extremity Assessment: Generalized weakness    Cervical / Trunk Assessment Cervical / Trunk Assessment: Other exceptions Cervical / Trunk Exceptions: s/p surgery  Communication   Communication: No difficulties  Cognition Arousal/Alertness: Awake/alert Behavior During Therapy: WFL for tasks assessed/performed Overall Cognitive Status: Within Functional Limits for tasks assessed                                        General Comments      Exercises     Assessment/Plan    PT Assessment Patient needs continued PT services  PT Problem List Decreased strength;Decreased range of motion;Decreased activity tolerance;Decreased balance;Decreased mobility;Decreased knowledge of use of DME;Decreased safety awareness;Decreased knowledge of precautions;Pain       PT Treatment Interventions DME instruction;Gait training;Stair training;Functional mobility training;Therapeutic activities;Therapeutic exercise;Neuromuscular re-education;Patient/family education    PT Goals (Current goals can be found in the Care Plan section)  Acute Rehab PT Goals Patient Stated Goal: Go to rehab at d/c PT Goal Formulation: With patient Time For Goal Achievement: 02/08/17 Potential to Achieve Goals: Good    Frequency Min 5X/week   Barriers to discharge Decreased caregiver support;Other (comment) Pt essentially homeless and does not have a reliable d/c plan    Co-evaluation               AM-PAC PT "6 Clicks" Daily Activity  Outcome Measure Difficulty turning over in bed (including adjusting bedclothes, sheets and blankets)?: A Little Difficulty moving from lying on back to sitting on the side of the bed? : A Little Difficulty sitting down on and standing up from a chair with arms (e.g., wheelchair, bedside commode, etc,.)?: A Little Help needed moving to and from a bed to  chair (including a wheelchair)?: A Little Help needed walking in hospital room?: A Little Help needed climbing 3-5 steps with a railing? : A Little 6 Click Score: 18    End of Session Equipment Utilized During Treatment: Back brace;Gait belt Activity Tolerance: Patient tolerated treatment well Patient left: in chair;with call bell/phone within reach Nurse Communication: Mobility status PT Visit Diagnosis: Unsteadiness on feet (R26.81);Other symptoms and signs involving the nervous system (R29.898);Pain Pain - part of body:  (back)    Time: 3546-5681 PT Time Calculation (min) (ACUTE ONLY): 25 min   Charges:   PT Evaluation $PT Eval Moderate Complexity: 1 Procedure PT Treatments $Gait Training: 8-22 mins   PT G Codes:        Rolinda Roan, PT, DPT Acute Rehabilitation Services Pager: [phone]   Thelma Comp 01/25/2017, 12:55 PM

## 2017-01-25 NOTE — NC FL2 (Signed)
[place] LEVEL OF CARE SCREENING TOOL     IDENTIFICATION  Patient Name: [name] Birthdate: 1951/05/20 Sex: male Admission Date (Current Location): 01/24/2017  [facility] and [place] Number:  Herbalist and Address:  [facility] [place]. [facility], [place] [address], [place], [place] [address]      Provider Number: [id number]  Attending Physician Name and Address:  Newman Pies, MD  Relative Name and Phone Number:  Rasheen Bells, Daughter [phone]    Current Level of Care: Hospital Recommended Level of Care: [place] Prior Approval Number:    Date Approved/Denied:   PASRR Number:   [id number]  Discharge Plan: SNF    Current Diagnoses: Patient Active Problem List   Diagnosis Date Noted  . Lumbar stenosis with neurogenic claudication 01/24/2017  . Enlarged prostate with urinary obstruction 06/16/2015  . Lumbar degenerative disc disease 03/05/2015  . Chronic hepatitis C without hepatic coma ([place]) 12/09/2014  . Chronic low back pain 01/17/2014  . Osteoarthritis of left knee 09/21/2013  . Alcohol dependence ([place]) 06/12/2013  . Cocaine dependence ([place]) 06/12/2013  . Substance induced mood disorder ([place]) 06/12/2013    Orientation RESPIRATION BLADDER Height & Weight     Self, Time, Situation, Place  Normal Continent Weight: 182 lb (82.6 kg) Height:     BEHAVIORAL SYMPTOMS/MOOD NEUROLOGICAL BOWEL NUTRITION STATUS      Continent Diet (regular)  AMBULATORY STATUS COMMUNICATION OF NEEDS Skin   Limited Assist Verbally Surgical wounds (closed incision on back, gauze dressing)                       Personal Care Assistance Level of Assistance  Bathing, Feeding, Dressing Bathing Assistance: Limited assistance Feeding assistance: Independent Dressing Assistance: Limited assistance     Functional Limitations Info  Sight, Hearing, Speech Sight Info: Adequate Hearing Info: Adequate Speech Info: Adequate     SPECIAL CARE FACTORS FREQUENCY  PT (By licensed PT)     PT Frequency: 5x/week              Contractures Contractures Info: Not present    Additional Factors Info  Code Status, Allergies Code Status Info: Full Allergies Info: NKA           Current Medications (01/25/2017):  This is the current hospital active medication list Current Facility-Administered Medications  Medication Dose Route Frequency Provider Last Rate Last Dose  . 0.9 %  sodium chloride infusion  250 mL Intravenous Continuous Newman Pies, MD 1 mL/hr at 01/24/17 1850 250 mL at 01/24/17 1850  . acetaminophen (TYLENOL) tablet 650 mg  650 mg Oral Q4H PRN Newman Pies, MD   650 mg at 01/25/17 0913   Or  . acetaminophen (TYLENOL) suppository 650 mg  650 mg Rectal Q4H PRN Newman Pies, MD      . bisacodyl (DULCOLAX) suppository 10 mg  10 mg Rectal Daily PRN Newman Pies, MD      . cyclobenzaprine (FLEXERIL) tablet 10 mg  10 mg Oral TID PRN Newman Pies, MD   10 mg at 01/25/17 1428  . diphenhydrAMINE (BENADRYL) capsule 25 mg  25 mg Oral Q6H PRN Newman Pies, MD   25 mg at 01/25/17 0421  . docusate sodium (COLACE) capsule 100 mg  100 mg Oral BID Newman Pies, MD   100 mg at 01/25/17 0913  . lactated ringers infusion   Intravenous Continuous Newman Pies, MD 50 mL/hr at 01/24/17 1029    . menthol-cetylpyridinium (CEPACOL)  lozenge 3 mg  1 lozenge Oral PRN Newman Pies, MD       Or  . phenol [facility]) mouth spray 1 spray  1 spray Mouth/Throat PRN Newman Pies, MD      . morphine 4 MG/ML injection 4 mg  4 mg Intravenous Q2H PRN Newman Pies, MD      . ondansetron [facility]) tablet 4 mg  4 mg Oral Q6H PRN Newman Pies, MD       Or  . ondansetron [facility]) injection 4 mg  4 mg Intravenous Q6H PRN Newman Pies, MD      . oxyCODONE (Oxy IR/ROXICODONE) immediate release tablet 15 mg  15 mg Oral Q4H PRN Newman Pies, MD   15 mg at 01/25/17 1428  . sodium chloride  flush (NS) 0.9 % injection 3 mL  3 mL Intravenous Q12H Newman Pies, MD   3 mL at 01/24/17 2200  . sodium chloride flush (NS) 0.9 % injection 3 mL  3 mL Intravenous PRN Newman Pies, MD      . Derrill Memo ON 01/26/2017] tamsulosin (FLOMAX) capsule 0.4 mg  0.4 mg Oral Daily Newman Pies, MD         Discharge Medications: Please see discharge summary for a list of discharge medications.  Relevant Imaging Results:  Relevant Lab Results:   Additional Information SSN: [id number]  Estanislado Emms, LCSW

## 2017-01-26 MED ORDER — TAMSULOSIN HCL 0.4 MG PO CAPS: 0.4000 mg | ORAL_CAPSULE | Freq: Every day | ORAL | 1 refills | Status: DC

## 2017-01-26 MED ORDER — DOCUSATE SODIUM 100 MG PO CAPS: 100.0000 mg | ORAL_CAPSULE | Freq: Two times a day (BID) | ORAL | 0 refills | Status: DC

## 2017-01-26 MED ORDER — OXYCODONE HCL 15 MG PO TABS: 15.0000 mg | ORAL_TABLET | ORAL | 0 refills | Status: DC | PRN

## 2017-01-26 MED ORDER — CYCLOBENZAPRINE HCL 10 MG PO TABS: 10.0000 mg | ORAL_TABLET | Freq: Three times a day (TID) | ORAL | 1 refills | Status: DC | PRN

## 2017-01-26 NOTE — Progress Notes (Signed)
Report called to [facility] at [facility]. Pt D/C'd to SNF via ambulance per MD order. Pt is stable @ D/C and has no other needs at this time. Holli Humbles, RN

## 2017-01-26 NOTE — Discharge Summary (Signed)
Physician Discharge Summary  Patient ID: [name] MRN: [id number] DOB/AGE: [birth year] 64 y.o.  Admit date: 01/24/2017 Discharge date: 01/26/2017  Admission Diagnoses:L3-4 herniated disc, spinal stenosis, lumbar radiculopathy, lumbago, neurogenic claudication  Discharge Diagnoses: The same Active Problems:   Lumbar stenosis with neurogenic claudication   Discharged Condition: good  Hospital Course: I perform an exploration of patient's lumbar fusion with an L3-4 decompression, instrumentation, and fusion on 01/24/2017. The surgery went well.  The patient's postoperative course was unremarkable. On postoperative day #2 the patient was transferred to start rehabilitation. The patient was given written and oral discharge instructions. All his questions were answered.  Consults: Physical therapy Significant Diagnostic Studies: None Treatments: L3-4 decompression, agitation, fusion, exploration of fusion Discharge Exam: Blood pressure 118/80, pulse 99, temperature 98.8 F (37.1 C), resp. rate 16, weight 82.6 kg (182 lb), SpO2 100 %. The patient is alert and pleasant. His dressing is clean and dry. His strength is normal in his lower extremities.  Disposition: Skilled nursing facility  Discharge Instructions    Call MD for:  difficulty breathing, headache or visual disturbances    Complete by:  As directed    Call MD for:  extreme fatigue    Complete by:  As directed    Call MD for:  hives    Complete by:  As directed    Call MD for:  persistant dizziness or light-headedness    Complete by:  As directed    Call MD for:  persistant nausea and vomiting    Complete by:  As directed    Call MD for:  redness, tenderness, or signs of infection (pain, swelling, redness, odor or green/yellow discharge around incision site)    Complete by:  As directed    Call MD for:  severe uncontrolled pain    Complete by:  As directed    Call MD for:  temperature >100.4    Complete by:  As  directed    Diet - low sodium heart healthy    Complete by:  As directed    Discharge instructions    Complete by:  As directed    Call [phone] for a followup appointment. Take a stool softener while you are using pain medications.   Driving Restrictions    Complete by:  As directed    Do not drive for 2 weeks.   Increase activity slowly    Complete by:  As directed    Lifting restrictions    Complete by:  As directed    Do not lift more than 5 pounds. No excessive bending or twisting.   May shower / Bathe    Complete by:  As directed    He may shower after the pain she is removed 3 days after surgery. Leave the incision alone.   Remove dressing in 24 hours    Complete by:  As directed      Allergies as of 01/26/2017      Reactions   No Known Allergies       Medication List    STOP taking these medications   Naproxen Sodium 220 MG Caps   oxyCODONE-acetaminophen 10-325 MG tablet Commonly known as:  PERCOCET     TAKE these medications   cyclobenzaprine 10 MG tablet Commonly known as:  FLEXERIL Take 10 mg by mouth 3 (three) times daily as needed for muscle spasms. What changed:  Another medication with the same name was added. Make sure you understand how and when to take  each.   cyclobenzaprine 10 MG tablet Commonly known as:  FLEXERIL Take 1 tablet (10 mg total) by mouth 3 (three) times daily as needed for muscle spasms. What changed:  You were already taking a medication with the same name, and this prescription was added. Make sure you understand how and when to take each.   docusate sodium 100 MG capsule Commonly known as:  COLACE Take 1 capsule (100 mg total) by mouth 2 (two) times daily.   DULoxetine 60 MG capsule Commonly known as:  CYMBALTA Take 1 capsule (60 mg total) by mouth daily.   oxyCODONE 15 MG immediate release tablet Commonly known as:  ROXICODONE Take 1 tablet (15 mg total) by mouth every 4 (four) hours as needed for moderate pain.    tamsulosin 0.4 MG Caps capsule Commonly known as:  FLOMAX Take 1 capsule (0.4 mg total) by mouth daily. Start taking on:  01/27/2017      Contact information for after-discharge care    Destination    [facility] .   Specialty:  [place] information: [address] [place] [phone]              Signed: Ophelia Charter 01/26/2017, 1:25 PM

## 2017-01-26 NOTE — Progress Notes (Signed)
Physical Therapy Treatment Patient Details Name: [name] MRN: [id number] DOB: [birth year] Today's Date: 01/26/2017    History of Present Illness Pt is a 66 y/o male who presents s/p L3-L4 TLIF on 01/24/17.    PT Comments    Pt progressing towards physical therapy goals. Was able to ambulate in [place] today with increased pain due to increased activity earlier in the morning. Pt was educated on appropriate activity progression, pain control, brace application, and general safety and posture. Will continue to follow and progress as able per POC.    Follow Up Recommendations  SNF;Supervision/Assistance - 24 hour     Equipment Recommendations  Rolling walker with 5" wheels;3in1 (PT)    Recommendations for Other Services       Precautions / Restrictions Precautions Precautions: Fall;Back Precaution Booklet Issued: Yes (comment) Precaution Comments: Reviewed handout in detail. Pt was cued for precautions during functional mobility.  Required Braces or Orthoses: Spinal Brace Spinal Brace: Lumbar corset;Applied in sitting position Restrictions Weight Bearing Restrictions: No    Mobility  Bed Mobility Overal bed mobility: Needs Assistance Bed Mobility: Rolling;Sidelying to Sit Rolling: Supervision Sidelying to sit: Min guard       General bed mobility comments: Close guard for safety as pt transitioned to EOB. VC's for maintenance of back precautions.   Transfers Overall transfer level: Needs assistance Equipment used: Rolling walker (2 wheeled) Transfers: Sit to/from Stand Sit to Stand: Min guard         General transfer comment: Close guard for safety as pt powered up to full standing position. Pt practiced x3 during session for technique.   Ambulation/Gait Ambulation/Gait assistance: Min guard   Assistive device: Rolling walker (2 wheeled) Gait Pattern/deviations: Step-through pattern;Decreased stride length;Trunk flexed Gait velocity: Decreased   General Gait  Details: VC's for improved posture and general safety/positioning with the RW. Pt with grossly flexed knees and had difficulty maintaining knee extension in standing. Was able to improve posture for short bouts but unable to maintain.    Stairs            Wheelchair Mobility    Modified Rankin (Stroke Patients Only)       Balance Overall balance assessment: Needs assistance Sitting-balance support: Feet supported;No upper extremity supported Sitting balance-Leahy Scale: Good     Standing balance support: Bilateral upper extremity supported;During functional activity Standing balance-Leahy Scale: Poor Standing balance comment: Requires UE support for safe dynamic standing balance                            Cognition Arousal/Alertness: Awake/alert Behavior During Therapy: WFL for tasks assessed/performed Overall Cognitive Status: Within Functional Limits for tasks assessed                                        Exercises      General Comments        Pertinent Vitals/Pain Pain Assessment: Faces Faces Pain Scale: Hurts little more Pain Location: Incision site Pain Descriptors / Indicators: Operative site guarding;Aching;Tiring Pain Intervention(s): Monitored during session    Home Living                      Prior Function            PT Goals (current goals can now be found in the care plan section) Acute Rehab  PT Goals Patient Stated Goal: Go to rehab at d/c PT Goal Formulation: With patient Time For Goal Achievement: 02/08/17 Potential to Achieve Goals: Good Progress towards PT goals: Progressing toward goals    Frequency    Min 5X/week      PT Plan Current plan remains appropriate    Co-evaluation              AM-PAC PT "6 Clicks" Daily Activity  Outcome Measure  Difficulty turning over in bed (including adjusting bedclothes, sheets and blankets)?: A Little Difficulty moving from lying on back to  sitting on the side of the bed? : A Little Difficulty sitting down on and standing up from a chair with arms (e.g., wheelchair, bedside commode, etc,.)?: A Little Help needed moving to and from a bed to chair (including a wheelchair)?: A Little Help needed walking in hospital room?: A Little Help needed climbing 3-5 steps with a railing? : A Little 6 Click Score: 18    End of Session Equipment Utilized During Treatment: Back brace;Gait belt Activity Tolerance: Patient tolerated treatment well Patient left: in chair;with call bell/phone within reach Nurse Communication: Mobility status PT Visit Diagnosis: Unsteadiness on feet (R26.81);Other symptoms and signs involving the nervous system (R29.898);Pain Pain - part of body:  (back)     Time: 6546-5035 PT Time Calculation (min) (ACUTE ONLY): 19 min  Charges:  $Gait Training: 8-22 mins                    G Codes:       [name], PT, DPT Acute Rehabilitation Services Pager: [phone] \   [name] 01/26/2017, 2:41 PM

## 2017-01-26 NOTE — Progress Notes (Signed)
Patient will DC to: [facility] Anticipated DC date: 01/26/17 Family notified: N/A patient asking for belongings to be picked up by family Transport by: Corey Harold   Per MD patient ready for DC to [place]. RN, patient, patient's family, and facility notified of DC. Discharge Summary sent to facility. RN given number for report. DC packet on chart. Ambulance transport requested for patient.   CSW signing off.  Cedric Fishman, [place] Social Worker [phone]

## 2017-01-26 NOTE — Clinical Social Work Placement (Signed)
   CLINICAL SOCIAL WORK PLACEMENT  NOTE  Date:  01/26/2017  Patient Details  Name: [name] MRN: [id number] Date of Birth: June 26, 1951  Clinical Social Work is seeking post-discharge placement for this patient at the [place] level of care (*CSW will initial, date and re-position this form in  chart as items are completed):  Yes   Patient/family provided with [facility] Work Department's list of facilities offering this level of care within the geographic area requested by the patient (or if unable, by the patient's family).  Yes   Patient/family informed of their freedom to choose among providers that offer the needed level of care, that participate in Medicare, Medicaid or managed care program needed by the patient, have an available bed and are willing to accept the patient.  Yes   Patient/family informed of [facility]'s ownership interest in [facility] and [facility], as well as of the fact that they are under no obligation to receive care at these facilities.  PASRR submitted to EDS on 01/26/17     PASRR number received on 01/26/17     Existing PASRR number confirmed on       FL2 transmitted to all facilities in geographic area requested by pt/family on 01/26/17     FL2 transmitted to all facilities within larger geographic area on       Patient informed that his/her managed care company has contracts with or will negotiate with certain facilities, including the following:  [facility]     Yes   Patient/family informed of bed offers received.  Patient chooses bed at [place] [facility]     Physician recommends and patient chooses bed at [facility]    Patient to be transferred to [facility] on 01/26/17.  Patient to be transferred to facility by [facility]     Patient family notified on 01/26/17 of transfer.  Name of family member notified:         PHYSICIAN Please sign FL2, Please prepare priority discharge summary, including medications     Additional Comment:    _______________________________________________ Estanislado Emms, LCSW 01/26/2017, 11:58 AM

## 2017-01-27 ENCOUNTER — Non-Acute Institutional Stay (SKILLED_NURSING_FACILITY): Payer: Medicaid Other | Admitting: Adult Health

## 2017-01-27 ENCOUNTER — Encounter: Payer: Self-pay | Admitting: Adult Health

## 2017-01-27 DIAGNOSIS — N138 Other obstructive and reflux uropathy: Secondary | ICD-10-CM | POA: Diagnosis not present

## 2017-01-27 DIAGNOSIS — T402X5A Adverse effect of other opioids, initial encounter: Secondary | ICD-10-CM

## 2017-01-27 DIAGNOSIS — K5903 Drug induced constipation: Secondary | ICD-10-CM | POA: Diagnosis not present

## 2017-01-27 DIAGNOSIS — M48062 Spinal stenosis, lumbar region with neurogenic claudication: Secondary | ICD-10-CM | POA: Diagnosis not present

## 2017-01-27 DIAGNOSIS — M5136 Other intervertebral disc degeneration, lumbar region: Secondary | ICD-10-CM

## 2017-01-27 DIAGNOSIS — N401 Enlarged prostate with lower urinary tract symptoms: Secondary | ICD-10-CM | POA: Diagnosis not present

## 2017-01-27 NOTE — Progress Notes (Signed)
Location:   [facility] Room Number: 127 A Place of Service:  SNF (31)   CODE STATUS: Full Code  Allergies  Allergen Reactions  . No Known Allergies     Chief Complaint  Patient presents with  . Hospitalization Follow-up    Hospital Follow up    HPI:  He has been hospitalized for an L4-S1 fusion. He is here for short term rehab; his goal is to return back home. He is complaining of back pain and left leg pain. He tells me that the pain is 11/10 and that he is not getting any relief.  There are no nursing concerns at this time.    Past Medical History:  Diagnosis Date  . Alcohol abuse   . Anxiety   . Arthritis   . BPH (benign prostatic hyperplasia)   . Chronic hepatitis C without hepatic coma ([place]) FOLLOWED BY INFECTOUS DISEASE DR COMER   POSITIVE ANTIBODY TEST THIS YEAR 2016--  CURRENT TX ON HARVONI PO  . Chronic low back pain   . Collapsed lung    right .Marland Kitchen..15 yrs ago, fell and hit some bricks  . DDD (degenerative disc disease), lumbosacral   . Depression    no meds now- med used for bladder  . Foley catheter in place   . GERD (gastroesophageal reflux disease)    hx none in long time  . History of cocaine abuse    per pt quit and last used 06-03-2013  . Urinary retention     Past Surgical History:  Procedure Laterality Date  . APPENDECTOMY  1980's  . BACK SURGERY    . CERVICAL DISC SURGERY    . CLOSED REDUCTION AND INTRAMAXILLARY FIXATION BILATERAL MENTAL FX'S/  PLACEMENT MAXILLARY STENT/ APERTURE WIRE PLACEMENT  01-04-2000  . JOINT REPLACEMENT    . POSTERIOR LUMBAR FUSION  03-05-2015   laminectomy /  nerve decompression--  L4 -- S1  . TOTAL KNEE ARTHROPLASTY Left 09/21/2013   Procedure: LEFT TOTAL KNEE ARTHROPLASTY;  Surgeon: Yvette Rack., MD;  Location: [facility];  Service: Orthopedics;  Laterality: Left;  . TRANSURETHRAL RESECTION OF PROSTATE N/A 06/16/2015   Procedure: TRANSURETHRAL RESECTION OF THE PROSTATE WITH GYRUS INSTRUMENTS;  Surgeon:  Franchot Gallo, MD;  Location: [facility];  Service: Urology;  Laterality: N/A;    Social History   Social History  . Marital status: Divorced    Spouse name: N/A  . Number of children: N/A  . Years of education: N/A   Occupational History  . Not on file.   Social History Main Topics  . Smoking status: Never Smoker  . Smokeless tobacco: Never Used  . Alcohol use 3.6 oz/week    6 Cans of beer per week  . Drug use: No     Comment: per pt quit and last used crack 06-03-2013  . Sexual activity: Not on file   Other Topics Concern  . Not on file   Social History Narrative  . No narrative on file   History reviewed. No pertinent family history.    VITAL SIGNS BP 108/69   Pulse 92   Temp 97.4 F (36.3 C)   Resp 20   Ht 6' (1.829 m)   Wt 182 lb (82.6 kg)   SpO2 97%   BMI 24.68 kg/m   Patient's Medications  New Prescriptions   No medications on file  Previous Medications   CYCLOBENZAPRINE (FLEXERIL) 10 MG TABLET    Take 10 mg by mouth  3 (three) times daily as needed for muscle spasms.   DOCUSATE SODIUM (COLACE) 100 MG CAPSULE    Take 1 capsule (100 mg total) by mouth 2 (two) times daily.   DULOXETINE (CYMBALTA) 60 MG CAPSULE    Take 1 capsule (60 mg total) by mouth daily.   OXYCODONE (ROXICODONE) 15 MG IMMEDIATE RELEASE TABLET    Take 1 tablet (15 mg total) by mouth every 4 (four) hours as needed for moderate pain.   TAMSULOSIN (FLOMAX) 0.4 MG CAPS CAPSULE    Take 1 capsule (0.4 mg total) by mouth daily.  Modified Medications   No medications on file  Discontinued Medications   CYCLOBENZAPRINE (FLEXERIL) 10 MG TABLET    Take 1 tablet (10 mg total) by mouth 3 (three) times daily as needed for muscle spasms.     SIGNIFICANT DIAGNOSTIC EXAMS   01-24-17: lumbar x-ray: Images obtained during L3-4 fusion demonstrating bilateral L3 pedicle screws and an interbody fusion prosthesis appropriately positioned in the L3-4 disc space.   LABS REVIEWED:    01-17-17: wbc 8.0; hgb 16.7; hct 48.8; mcv 92.1; plt 238; glucose 93; bun 9; creat 1.09; k+ 4.5; na++ 139; ca 9.8; liver normal albumin 4.2     Review of Systems  Constitutional: Negative for malaise/fatigue.  Respiratory: Negative for cough and shortness of breath.   Cardiovascular: Negative for chest pain, palpitations and leg swelling.  Gastrointestinal: Negative for abdominal pain, constipation and heartburn.  Musculoskeletal: Positive for back pain and myalgias. Negative for joint pain.       Has back pain Left leg pain tingling in nature  Skin: Negative.        Surgical incision   Neurological: Negative for dizziness.  Psychiatric/Behavioral: The patient is not nervous/anxious.     Physical Exam  Constitutional: He is oriented to person, place, and time. No distress.  Eyes: Conjunctivae are normal.  Neck: Neck supple. No JVD present. No thyromegaly present.  Cardiovascular: Normal rate, regular rhythm and intact distal pulses.   Respiratory: Effort normal and breath sounds normal. No respiratory distress. He has no wheezes.  GI: Soft. Bowel sounds are normal. He exhibits no distension. There is no tenderness.  Musculoskeletal: He exhibits no edema.  Able to move all extremities  Has back brace has lower extremity weakness   Lymphadenopathy:    He has no cervical adenopathy.  Neurological: He is alert and oriented to person, place, and time.  Skin: Skin is warm and dry. He is not diaphoretic.  Has bruising on lower back   Psychiatric: He has a normal mood and affect.      ASSESSMENT/ PLAN:  1. BPH with urinary obstruction: has a history of turp: will continue flomax 0.4 mg daily   2. Substance induced mood disorder: will continue cymbalta 60 mg daily   3. Constipation: will continue colace twice daily   4. Lumbar stenosis with neurogenic claudication: is status post lumbar fusion: will continue therapy as directed; will continue oxycodone 15 mg every 4 hours as  needed will change flexeril 10 mg every 8 hours routinely and will begin neurontin 300 mg nightly and will monitor   Time spent with patient  50  minutes >50% time spent counseling; reviewing medical record; tests; labs; and developing future plan of care   MD is aware of resident's narcotic use and is in agreement with current plan of care. We will attempt to wean resident as [place] NP [facility]  Contact [phone]  Monday through Friday 8am- 5pm  After hours call [phone]

## 2017-01-28 ENCOUNTER — Non-Acute Institutional Stay (SKILLED_NURSING_FACILITY): Payer: Medicaid Other | Admitting: Adult Health

## 2017-01-28 ENCOUNTER — Encounter: Payer: Self-pay | Admitting: Adult Health

## 2017-01-28 DIAGNOSIS — M4326 Fusion of spine, lumbar region: Secondary | ICD-10-CM

## 2017-01-28 DIAGNOSIS — M48062 Spinal stenosis, lumbar region with neurogenic claudication: Secondary | ICD-10-CM | POA: Diagnosis not present

## 2017-01-28 NOTE — Progress Notes (Signed)
Location:   [place] Room Number: 127 A Place of Service:  SNF (31)   CODE STATUS: Full Code  Allergies  Allergen Reactions  . No Known Allergies     Chief Complaint  Patient presents with  . Acute Visit    Care Planning    HPI:  We are having a care plan meeting to discuss his discharge options. He states that he has no place to return to upon discharge from the facility. His housing fell through. He states that he is doing well at this time and has no complaints. He does continue to participate in therapy.   Past Medical History:  Diagnosis Date  . Alcohol abuse   . Anxiety   . Arthritis   . BPH (benign prostatic hyperplasia)   . 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 Chronic low back pain   . Collapsed lung    right .Marland Kitchen..15 yrs ago, fell and hit some bricks  . DDD (degenerative disc disease), lumbosacral   . Depression    no meds now- med used for bladder  . Foley catheter in place   . GERD (gastroesophageal reflux disease)    hx none in long time  . History of cocaine abuse    per pt quit and last used 06-03-2013  . Urinary retention     Past Surgical History:  Procedure Laterality Date  . APPENDECTOMY  1980's  . BACK SURGERY    . CERVICAL DISC SURGERY    . CLOSED REDUCTION AND INTRAMAXILLARY FIXATION BILATERAL MENTAL FX'S/  PLACEMENT MAXILLARY STENT/ APERTURE WIRE PLACEMENT  01-04-2000  . JOINT REPLACEMENT    . POSTERIOR LUMBAR FUSION  03-05-2015   laminectomy /  nerve decompression--  L4 -- S1  . TOTAL KNEE ARTHROPLASTY Left 09/21/2013   Procedure: LEFT TOTAL KNEE ARTHROPLASTY;  Surgeon: Yvette Rack., MD;  Location: [facility];  Service: Orthopedics;  Laterality: Left;  . TRANSURETHRAL RESECTION OF PROSTATE N/A 06/16/2015   Procedure: TRANSURETHRAL RESECTION OF THE PROSTATE WITH GYRUS INSTRUMENTS;  Surgeon: Franchot Gallo,  MD;  Location: [facility];  Service: Urology;  Laterality: N/A;    Social History   Social History  . Marital status: Divorced    Spouse name: N/A  . Number of children: N/A  . Years of education: N/A   Occupational History  . Not on file.   Social History Main Topics  . Smoking status: Never Smoker  . Smokeless tobacco: Never Used  . Alcohol use 3.6 oz/week    6 Cans of beer per week  . Drug use: No     Comment: per pt quit and last used crack 06-03-2013  . Sexual activity: Not on file   Other Topics Concern  . Not on file   Social History Narrative  . No narrative on file   History reviewed. No pertinent family history.    VITAL SIGNS BP 112/70   Pulse 73   Temp (!) 96.9 F (36.1 C)   Resp 18   Ht 6' (1.829 m)   Wt 182 lb (82.6 kg)   SpO2 94%   BMI 24.68 kg/m   Patient's Medications  New Prescriptions   No medications on file  Previous Medications   CYCLOBENZAPRINE (FLEXERIL) 10 MG TABLET    Take 10 mg by mouth 3 (three) times daily as needed  for muscle spasms.   DOCUSATE SODIUM (COLACE) 100 MG CAPSULE    Take 1 capsule (100 mg total) by mouth 2 (two) times daily.   DULOXETINE (CYMBALTA) 60 MG CAPSULE    Take 1 capsule (60 mg total) by mouth daily.   GABAPENTIN (NEURONTIN) 300 MG CAPSULE    Take 300 mg by mouth at bedtime.   OXYCODONE (ROXICODONE) 15 MG IMMEDIATE RELEASE TABLET    Take 1 tablet (15 mg total) by mouth every 4 (four) hours as needed for moderate pain.   TAMSULOSIN (FLOMAX) 0.4 MG CAPS CAPSULE    Take 1 capsule (0.4 mg total) by mouth daily.  Modified Medications   No medications on file  Discontinued Medications   No medications on file     SIGNIFICANT DIAGNOSTIC EXAMS  01-24-17: lumbar x-ray: Images obtained during L3-4 fusion demonstrating bilateral L3 pedicle screws and an interbody fusion prosthesis appropriately positioned in the L3-4 disc space.   LABS REVIEWED:   01-17-17: wbc 8.0; hgb 16.7; hct 48.8; mcv 92.1;  plt 238; glucose 93; bun 9; creat 1.09; k+ 4.5; na++ 139; ca 9.8; liver normal albumin 4.2     Review of Systems  Constitutional: Negative for malaise/fatigue.  Respiratory: Negative for cough and shortness of breath.   Cardiovascular: Negative for chest pain, palpitations and leg swelling.  Gastrointestinal: Negative for abdominal pain, constipation and heartburn.  Musculoskeletal: Positive for back pain and myalgias. Negative for joint pain.       Has back pain Left leg pain tingling in nature  Skin: Negative.        Surgical incision   Neurological: Negative for dizziness.  Psychiatric/Behavioral: The patient is not nervous/anxious.     Physical Exam  Constitutional: He is oriented to person, place, and time. No distress.  Eyes: Conjunctivae are normal.  Neck: Neck supple. No JVD present. No thyromegaly present.  Cardiovascular: Normal rate, regular rhythm and intact distal pulses.   Respiratory: Effort normal and breath sounds normal. No respiratory distress. He has no wheezes.  GI: Soft. Bowel sounds are normal. He exhibits no distension. There is no tenderness.  Musculoskeletal: He exhibits no edema.  Able to move all extremities  Has back brace has lower extremity weakness   Lymphadenopathy:    He has no cervical adenopathy.  Neurological: He is alert and oriented to person, place, and time.  Skin: Skin is warm and dry. He is not diaphoretic.  Has bruising on lower back   Psychiatric: He has a normal mood and affect.      ASSESSMENT/ PLAN:  1. Lumbar stenosis with neurogenic claudication: is status post lumbar fusion: will continue therapy as directed; will continue oxycodone 15 mg every 4 hours as needed will change flexeril 10 mg every 8 hours routinely and  neurontin 300 mg nightly and will monitor   Will have social service begin to work on his housing post discharge and will continue to monitor his status.   Time spent with patient  30  minutes >50% time  spent counseling; reviewing medical record; tests; labs; and developing future plan of care   MD is aware of resident's narcotic use and is in agreement with current plan of care. We will attempt to wean resident as apropriate    Ok Edwards NP [facility]  Contact [phone] Monday through Friday 8am- 5pm  After hours call [phone]

## 2017-01-31 ENCOUNTER — Encounter (HOSPITAL_COMMUNITY): Payer: Self-pay | Admitting: Emergency Medicine

## 2017-01-31 ENCOUNTER — Emergency Department (HOSPITAL_COMMUNITY)
Admission: EM | Admit: 2017-01-31 | Discharge: 2017-01-31 | Disposition: A | Discharge status: Home / Self Care | Payer: Medicaid Other | Attending: Emergency Medicine | Admitting: Emergency Medicine

## 2017-01-31 ENCOUNTER — Emergency Department (HOSPITAL_COMMUNITY): Payer: Medicaid Other

## 2017-01-31 ENCOUNTER — Emergency Department (HOSPITAL_COMMUNITY)
Admission: EM | Admit: 2017-01-31 | Discharge: 2017-02-01 | Disposition: A | Discharge status: Home / Self Care | Payer: Medicaid Other | Source: Home / Self Care | Attending: Emergency Medicine | Admitting: Emergency Medicine

## 2017-01-31 ENCOUNTER — Non-Acute Institutional Stay (SKILLED_NURSING_FACILITY): Payer: Medicaid Other | Admitting: Internal Medicine

## 2017-01-31 ENCOUNTER — Encounter: Payer: Self-pay | Admitting: Internal Medicine

## 2017-01-31 DIAGNOSIS — Z9889 Other specified postprocedural states: Secondary | ICD-10-CM

## 2017-01-31 DIAGNOSIS — K5903 Drug induced constipation: Secondary | ICD-10-CM | POA: Diagnosis not present

## 2017-01-31 DIAGNOSIS — M79604 Pain in right leg: Secondary | ICD-10-CM

## 2017-01-31 DIAGNOSIS — R338 Other retention of urine: Secondary | ICD-10-CM

## 2017-01-31 DIAGNOSIS — N138 Other obstructive and reflux uropathy: Secondary | ICD-10-CM

## 2017-01-31 DIAGNOSIS — Z79899 Other long term (current) drug therapy: Secondary | ICD-10-CM | POA: Insufficient documentation

## 2017-01-31 DIAGNOSIS — B182 Chronic viral hepatitis C: Secondary | ICD-10-CM

## 2017-01-31 DIAGNOSIS — Z87898 Personal history of other specified conditions: Secondary | ICD-10-CM

## 2017-01-31 DIAGNOSIS — F419 Anxiety disorder, unspecified: Secondary | ICD-10-CM | POA: Diagnosis not present

## 2017-01-31 DIAGNOSIS — R339 Retention of urine, unspecified: Secondary | ICD-10-CM | POA: Insufficient documentation

## 2017-01-31 DIAGNOSIS — R1084 Generalized abdominal pain: Secondary | ICD-10-CM | POA: Diagnosis not present

## 2017-01-31 DIAGNOSIS — N401 Enlarged prostate with lower urinary tract symptoms: Secondary | ICD-10-CM

## 2017-01-31 DIAGNOSIS — F1011 Alcohol abuse, in remission: Secondary | ICD-10-CM

## 2017-01-31 DIAGNOSIS — T402X5A Adverse effect of other opioids, initial encounter: Secondary | ICD-10-CM | POA: Diagnosis not present

## 2017-01-31 DIAGNOSIS — R39198 Other difficulties with micturition: Secondary | ICD-10-CM

## 2017-01-31 DIAGNOSIS — Z96652 Presence of left artificial knee joint: Secondary | ICD-10-CM | POA: Insufficient documentation

## 2017-01-31 DIAGNOSIS — G8918 Other acute postprocedural pain: Secondary | ICD-10-CM | POA: Diagnosis not present

## 2017-01-31 DIAGNOSIS — F1411 Cocaine abuse, in remission: Secondary | ICD-10-CM

## 2017-01-31 LAB — URINALYSIS, ROUTINE W REFLEX MICROSCOPIC
BACTERIA UA: NONE SEEN
BILIRUBIN URINE: NEGATIVE
Glucose, UA: NEGATIVE mg/dL
KETONES UR: NEGATIVE mg/dL
LEUKOCYTES UA: NEGATIVE
NITRITE: NEGATIVE
PROTEIN: NEGATIVE mg/dL
SPECIFIC GRAVITY, URINE: 1.01 (ref 1.005–1.030)
Squamous Epithelial / LPF: NONE SEEN
pH: 8 (ref 5.0–8.0)

## 2017-01-31 LAB — CBC WITH DIFFERENTIAL/PLATELET
BASOS PCT: 0 %
Basophils Absolute: 0 10*3/uL (ref 0.0–0.1)
EOS PCT: 5 %
Eosinophils Absolute: 0.4 10*3/uL (ref 0.0–0.7)
HCT: 37.8 % — ABNORMAL LOW (ref 39.0–52.0)
HEMOGLOBIN: 13.2 g/dL (ref 13.0–17.0)
Lymphocytes Relative: 26 %
Lymphs Abs: 2.3 10*3/uL (ref 0.7–4.0)
MCH: 31.6 pg (ref 26.0–34.0)
MCHC: 34.9 g/dL (ref 30.0–36.0)
MCV: 90.4 fL (ref 78.0–100.0)
MONOS PCT: 7 %
Monocytes Absolute: 0.6 10*3/uL (ref 0.1–1.0)
NEUTROS PCT: 62 %
Neutro Abs: 5.7 10*3/uL (ref 1.7–7.7)
PLATELETS: 336 10*3/uL (ref 150–400)
RBC: 4.18 MIL/uL — AB (ref 4.22–5.81)
RDW: 12.8 % (ref 11.5–15.5)
WBC: 9.1 10*3/uL (ref 4.0–10.5)

## 2017-01-31 LAB — COMPREHENSIVE METABOLIC PANEL
ALBUMIN: 3.4 g/dL — AB (ref 3.5–5.0)
ALK PHOS: 53 U/L (ref 38–126)
ALT: 15 U/L — ABNORMAL LOW (ref 17–63)
ANION GAP: 8 (ref 5–15)
AST: 21 U/L (ref 15–41)
BUN: 10 mg/dL (ref 6–20)
CALCIUM: 9.3 mg/dL (ref 8.9–10.3)
CHLORIDE: 99 mmol/L — AB (ref 101–111)
CO2: 28 mmol/L (ref 22–32)
Creatinine, Ser: 0.87 mg/dL (ref 0.61–1.24)
GFR calc non Af Amer: >60 mL/min (ref >60)
GLUCOSE: 99 mg/dL (ref 65–99)
Potassium: 4.1 mmol/L (ref 3.5–5.1)
SODIUM: 135 mmol/L (ref 135–145)
Total Bilirubin: 0.5 mg/dL (ref 0.3–1.2)
Total Protein: 7.6 g/dL (ref 6.5–8.1)

## 2017-01-31 LAB — LIPASE, BLOOD: Lipase: 15 U/L (ref 11–51)

## 2017-01-31 MED ORDER — MORPHINE SULFATE (PF) 4 MG/ML IV SOLN
4.0000 mg | Freq: Once | INTRAVENOUS | Status: AC
  Administered 2017-01-31: 4 mg via INTRAVENOUS
  Filled 2017-01-31: qty 1

## 2017-01-31 MED ORDER — ONDANSETRON HCL 4 MG/2ML IJ SOLN
4.0000 mg | Freq: Once | INTRAMUSCULAR | Status: AC
  Administered 2017-01-31: 4 mg via INTRAVENOUS
  Filled 2017-01-31: qty 2

## 2017-01-31 MED ORDER — ORPHENADRINE CITRATE ER 100 MG PO TB12: 100.0000 mg | ORAL_TABLET | Freq: Two times a day (BID) | ORAL | 0 refills | Status: DC

## 2017-01-31 MED ORDER — METHOCARBAMOL 1000 MG/10ML IJ SOLN
1000.0000 mg | Freq: Once | INTRAVENOUS | Status: AC
  Administered 2017-01-31: 1000 mg via INTRAVENOUS
  Filled 2017-01-31: qty 10

## 2017-01-31 MED ORDER — IOPAMIDOL (ISOVUE-300) INJECTION 61%
100.0000 mL | Freq: Once | INTRAVENOUS | Status: AC | PRN
  Administered 2017-01-31: 100 mL via INTRAVENOUS

## 2017-01-31 MED ORDER — IOPAMIDOL (ISOVUE-300) INJECTION 61%
INTRAVENOUS | Status: AC
  Filled 2017-01-31: qty 100

## 2017-01-31 MED ORDER — SODIUM CHLORIDE 0.9 % IV BOLUS (SEPSIS)
1000.0000 mL | Freq: Once | INTRAVENOUS | Status: AC
  Administered 2017-02-01: 1000 mL via INTRAVENOUS

## 2017-01-31 MED ORDER — SODIUM CHLORIDE 0.9 % IV BOLUS (SEPSIS)
1000.0000 mL | Freq: Once | INTRAVENOUS | Status: AC
  Administered 2017-01-31: 1000 mL via INTRAVENOUS

## 2017-01-31 MED ORDER — MORPHINE SULFATE (PF) 2 MG/ML IV SOLN
4.0000 mg | Freq: Once | INTRAVENOUS | Status: AC
  Administered 2017-02-01: 4 mg via INTRAVENOUS
  Filled 2017-01-31: qty 2

## 2017-01-31 MED ORDER — MORPHINE SULFATE (PF) 2 MG/ML IV SOLN
6.0000 mg | Freq: Once | INTRAVENOUS | Status: AC
  Administered 2017-01-31: 6 mg via INTRAVENOUS
  Filled 2017-01-31: qty 3

## 2017-01-31 NOTE — Discharge Instructions (Signed)
Get the prescription for oxycodone filled, and take that as needed for pain control.

## 2017-01-31 NOTE — Progress Notes (Signed)
Patient ID: [name], male   DOB: [birth year], 66 y.o.   MRN: [id number]    HISTORY AND PHYSICAL   DATE:  01/31/2017   Location:    [facility] Room Number: 127 A Place of Service: SNF (31)   Extended Emergency Contact Information Primary Emergency Contact: Gambrill,Melanie Address: [place], [place] [address] [place] Phone: [phone] Mobile Phone: [phone] Relation: Daughter Secondary Emergency Contact: Grissinger,Robert          Laren Boom, [place] Phone: [phone] Mobile Phone: [phone] Relation: Uncle  Advanced Directive information Does Patient Have a Medical Advance Directive?: No (Full Code), Would patient like information on creating a medical advance directive?: No - Patient declined  Chief Complaint  Patient presents with  . New Admit To SNF    Admission    HPI:  66 yo male seen today as a new admission following hospital stay for lumbar stenosis with neurogenic claudication, hx Etoh/cocaine abuse, anxiety/depression, arthritis, BPH with hx TURP, chronic Hep C  (Genotype 1a) without coma. He was taken to the OR 01/24/17 for L3-L4 decompression, instrumentation and fusion by Dr Arnoldo Morale. No postop complications. WBC 12.6K; K 5.3; Cr 1.11 at d/c. He presents to SNF for short term rehab.  Today he reports severe throbbing pain 9/10 on scale in RLE from lower back. He states he is unable to void and strains to urinate. He is voiding BID. He has a hx BPH but is s/p TURP. Nursing staff has had to cath him before and got back blood. No f/c. No upcoming appt with neurosx. sugical bandage changed at ED due to bloody. He was taken to the ED last night for uncontrolled postop pain radiating from low back --> LLE (prior to sx pain radiated into RLE). Norflex 170m BID added to pain regimen. Pain is unchanged at this time. He is taking oxycodone 15 mg every 4 hours as needed and neurontin 300 mg  nightly.  Appetite reduced. Sleeps sporadic.  Hx Hep C without coma - he completed 12 weeks of Harvoni but never f/u with ID. He states he saw his PCP and "all numbers" resolved.   BPH with urinary obstruction - s/p TURP. He has difficulty voiding. He takes flomax 0.4 mg daily   Substance induced mood disorder/anxiety/hx Etoh abuse and cocaine abuse - stable on cymbalta 60 mg daily. Per pt, he no longer drinks Etoh and stopped using cocaine some time ago.  Constipation - takes colace twice daily   Past Medical History:  Diagnosis Date  . Alcohol abuse   . Anxiety   . Arthritis   . BPH (benign prostatic hyperplasia)   . Chronic hepatitis C without hepatic coma ([place] FOLLOWED BY INFECTOUS DISEASE DR COMER   POSITIVE ANTIBODY TEST THIS YEAR 2016--  CURRENT TX ON HARVONI PO  . Chronic low back pain   . Collapsed lung    right ..Marland Kitchen.15 yrs ago, fell and hit some bricks  . DDD (degenerative disc disease), lumbosacral   . Depression    no meds now- med used for bladder  . Foley catheter in place   . GERD (gastroesophageal reflux disease)    hx none in long time  . History of cocaine abuse    per pt quit and last used 06-03-2013  . Urinary retention     Past Surgical History:  Procedure Laterality Date  .  APPENDECTOMY  1980's  . BACK SURGERY    . CERVICAL DISC SURGERY    . CLOSED REDUCTION AND INTRAMAXILLARY FIXATION BILATERAL MENTAL FX'S/  PLACEMENT MAXILLARY STENT/ APERTURE WIRE PLACEMENT  01-04-2000  . JOINT REPLACEMENT    . POSTERIOR LUMBAR FUSION  03-05-2015   laminectomy /  nerve decompression--  L4 -- S1  . TOTAL KNEE ARTHROPLASTY Left 09/21/2013   Procedure: LEFT TOTAL KNEE ARTHROPLASTY;  Surgeon: Yvette Rack., MD;  Location: [facility];  Service: Orthopedics;  Laterality: Left;  . TRANSURETHRAL RESECTION OF PROSTATE N/A 06/16/2015   Procedure: TRANSURETHRAL RESECTION OF THE PROSTATE WITH GYRUS INSTRUMENTS;  Surgeon: Franchot Gallo, MD;  [facility];   Service: Urology;  Laterality: N/A;    Patient Care Team: Kerin Perna, NP as PCP - General (Internal Medicine) Newman Pies, MD as Consulting Physician (Neurosurgery)  Social History   Social History  . Marital status: Divorced    Spouse name: N/A  . Number of children: N/A  . Years of education: N/A   Occupational History  . Not on file.   Social History Main Topics  . Smoking status: Never Smoker  . Smokeless tobacco: Never Used  . Alcohol use 3.6 oz/week    6 Cans of beer per week  . Drug use: No     Comment: per pt quit and last used crack 06-03-2013  . Sexual activity: Not on file   Other Topics Concern  . Not on file   Social History Narrative  . No narrative on file     reports that he has never smoked. He has never used smokeless tobacco. He reports that he drinks about 3.6 oz of alcohol per week . He reports that he does not use drugs.  History reviewed. No pertinent family history. Family Status  Relation Status  . Mother Alive  . Father Deceased    Immunization History  Administered Date(s) Administered  . PPD Test 01/26/2017  . Pneumococcal Polysaccharide-23 03/07/2015    Allergies  Allergen Reactions  . No Known Allergies     Medications: Patient's Medications  New Prescriptions   No medications on file  Previous Medications   DOCUSATE SODIUM (COLACE) 100 MG CAPSULE    Take 1 capsule (100 mg total) by mouth 2 (two) times daily.   DULOXETINE (CYMBALTA) 60 MG CAPSULE    Take 1 capsule (60 mg total) by mouth daily.   GABAPENTIN (NEURONTIN) 300 MG CAPSULE    Take 300 mg by mouth at bedtime.   IVERMECTIN (STROMECTOL) 3 MG TABS TABLET    Give 5 tablets by mouth one time only for two days   ORPHENADRINE (NORFLEX) 100 MG TABLET    Take 1 tablet (100 mg total) by mouth 2 (two) times daily.   OXYCODONE (ROXICODONE) 15 MG IMMEDIATE RELEASE TABLET    Take 1 tablet (15 mg total) by mouth every 4 (four) hours as needed for moderate pain.    TAMSULOSIN (FLOMAX) 0.4 MG CAPS CAPSULE    Take 1 capsule (0.4 mg total) by mouth daily.  Modified Medications   No medications on file  Discontinued Medications   CYCLOBENZAPRINE (FLEXERIL) 10 MG TABLET    Take 10 mg by mouth 3 (three) times daily as needed for muscle spasms.    Review of Systems  Gastrointestinal: Positive for abdominal pain.  Genitourinary: Positive for decreased urine volume and difficulty urinating.  Musculoskeletal: Positive for arthralgias, back pain and gait problem.  All other systems  reviewed and are negative.   Vitals:   01/31/17 1105  BP: 112/74  Pulse: 87  Resp: 17  Temp: 98.3 F (36.8 C)  TempSrc: Oral  SpO2: 96%  Weight: 187 lb 3.2 oz (84.9 kg)  Height: 6' 2" (1.88 m)   Body mass index is 24.04 kg/m.  Physical Exam  Constitutional: He is oriented to person, place, and time. He appears well-developed and well-nourished.  Sitting on edge of bed in NAD, looks uncomfortable  HENT:  Mouth/Throat: Oropharynx is clear and moist.  MMM; no oral thrush  Eyes: Pupils are equal, round, and reactive to light. No scleral icterus.  Neck: Neck supple. Carotid bruit is not present. No thyromegaly present.  Cardiovascular: Normal rate, regular rhythm and intact distal pulses.  Exam reveals no gallop and no friction rub.   Murmur (1/6 SEM) heard. Pulses:      Femoral pulses are 2+ on the right side, and 2+ on the left side.      Popliteal pulses are 2+ on the right side, and 2+ on the left side.       Dorsalis pedis pulses are 2+ on the right side, and 2+ on the left side.       Posterior tibial pulses are 2+ on the right side, and 2+ on the left side.  No distal LE edema. No calf TTP  Pulmonary/Chest: Effort normal and breath sounds normal. He has no wheezes. He has no rales. He exhibits no tenderness.  Abdominal: Soft. Normal appearance and bowel sounds are normal. He exhibits distension (at suprapubic). He exhibits no abdominal bruit, no pulsatile midline  mass and no mass. There is no hepatomegaly. There is tenderness (suprapubic). There is no rigidity, no rebound and no guarding. No hernia.  Musculoskeletal: He exhibits edema and tenderness (right inguinal area).       Back:  No ASIS TTP  Lymphadenopathy:    He has no cervical adenopathy.  Neurological: He is alert and oriented to person, place, and time.  Strength 4/5 in LLE. Gait antalgic  Skin: Skin is warm and dry. No rash noted.  Psychiatric: He has a normal mood and affect. His behavior is normal. Judgment and thought content normal.     Labs reviewed: Admission on 01/24/2017, Discharged on 01/26/2017  Component Date Value Ref Range Status  . WBC 01/25/2017 12.6* 4.0 - 10.5 K/uL Final  . RBC 01/25/2017 4.59  4.22 - 5.81 MIL/uL Final  . Hemoglobin 01/25/2017 14.1  13.0 - 17.0 g/dL Final  . HCT 01/25/2017 42.2  39.0 - 52.0 % Final  . MCV 01/25/2017 91.9  78.0 - 100.0 fL Final  . MCH 01/25/2017 30.7  26.0 - 34.0 pg Final  . MCHC 01/25/2017 33.4  30.0 - 36.0 g/dL Final  . RDW 01/25/2017 13.3  11.5 - 15.5 % Final  . Platelets 01/25/2017 234  150 - 400 K/uL Final  . Sodium 01/25/2017 137  135 - 145 mmol/L Final  . Potassium 01/25/2017 5.3* 3.5 - 5.1 mmol/L Final  . Chloride 01/25/2017 101  101 - 111 mmol/L Final  . CO2 01/25/2017 29  22 - 32 mmol/L Final  . Glucose, Bld 01/25/2017 132* 65 - 99 mg/dL Final  . BUN 01/25/2017 10  6 - 20 mg/dL Final  . Creatinine, Ser 01/25/2017 1.11  0.61 - 1.24 mg/dL Final  . Calcium 01/25/2017 9.5  8.9 - 10.3 mg/dL Final  . GFR calc non Af Amer 01/25/2017 >60  >60 mL/min Final  .  GFR calc Af Amer 01/25/2017 >60  >60 mL/min Final   Comment: (NOTE) The eGFR has been calculated using the CKD EPI equation. This calculation has not been validated in all clinical situations. eGFR's persistently <60 mL/min signify possible Chronic Kidney Disease.   Georgiann Hahn gap 01/25/2017 7  5 - 15 Final  Hospital Outpatient Visit on 01/17/2017  Component Date  Value Ref Range Status  . MRSA, PCR 01/17/2017 NEGATIVE  NEGATIVE Final  . Staphylococcus aureus 01/17/2017 NEGATIVE  NEGATIVE Final   Comment:        The Xpert SA Assay (FDA approved for NASAL specimens in patients over 70 years of age), is one component of a comprehensive surveillance program.  Test performance has been validated by [facility] for patients greater than or equal to 47 year old. It is not intended to diagnose infection nor to guide or monitor treatment.   . Sodium 01/17/2017 139  135 - 145 mmol/L Final  . Potassium 01/17/2017 4.5  3.5 - 5.1 mmol/L Final  . Chloride 01/17/2017 104  101 - 111 mmol/L Final  . CO2 01/17/2017 27  22 - 32 mmol/L Final  . Glucose, Bld 01/17/2017 93  65 - 99 mg/dL Final  . BUN 01/17/2017 9  6 - 20 mg/dL Final  . Creatinine, Ser 01/17/2017 1.09  0.61 - 1.24 mg/dL Final  . Calcium 01/17/2017 9.8  8.9 - 10.3 mg/dL Final  . Total Protein 01/17/2017 7.9  6.5 - 8.1 g/dL Final  . Albumin 01/17/2017 4.2  3.5 - 5.0 g/dL Final  . AST 01/17/2017 23  15 - 41 U/L Final  . ALT 01/17/2017 15* 17 - 63 U/L Final  . Alkaline Phosphatase 01/17/2017 75  38 - 126 U/L Final  . Total Bilirubin 01/17/2017 0.6  0.3 - 1.2 mg/dL Final  . GFR calc non Af Amer 01/17/2017 >60  >60 mL/min Final  . GFR calc Af Amer 01/17/2017 >60  >60 mL/min Final   Comment: (NOTE) The eGFR has been calculated using the CKD EPI equation. This calculation has not been validated in all clinical situations. eGFR's persistently <60 mL/min signify possible Chronic Kidney Disease.   . Anion gap 01/17/2017 8  5 - 15 Final  . WBC 01/17/2017 8.8  4.0 - 10.5 K/uL Final  . RBC 01/17/2017 5.30  4.22 - 5.81 MIL/uL Final  . Hemoglobin 01/17/2017 16.7  13.0 - 17.0 g/dL Final  . HCT 01/17/2017 48.8  39.0 - 52.0 % Final  . MCV 01/17/2017 92.1  78.0 - 100.0 fL Final  . MCH 01/17/2017 31.5  26.0 - 34.0 pg Final  . MCHC 01/17/2017 34.2  30.0 - 36.0 g/dL Final  . RDW 01/17/2017 13.4  11.5 - 15.5  % Final  . Platelets 01/17/2017 238  150 - 400 K/uL Final  . ABO/RH(D) 01/17/2017 A POS   Final  . Antibody Screen 01/17/2017 NEG   Final  . Sample Expiration 01/17/2017 01/31/2017   Final  . Extend sample reason 01/17/2017 NO TRANSFUSIONS OR PREGNANCY IN THE PAST 3 MONTHS   Final    Dg Lumbar Spine 2-3 Views  Result Date: 01/24/2017 CLINICAL DATA:  L3-4 fusion.  Prior L4-5 and L5-S1 fusion. EXAM: Operative LUMBAR SPINE - 2-3 VIEW COMPARISON:  Lumbar CT myelogram 06/09/2016. Intraoperative lumbar spine images 03/05/2015. FINDINGS: AP and lateral images of the lumbar spine obtained with the C-arm fluoroscopic device are submitted for interpretation postoperatively. Interbody fusion prosthesis is appropriately positioned in the L3-4 disc space. Bilateral  pedicle screws are present at L3. The mild compression fracture of the upper endplate of L3 is again noted. The radiologic technologist documented 14 seconds of fluoroscopy time. IMPRESSION: Images obtained during L3-4 fusion demonstrating bilateral L3 pedicle screws and an interbody fusion prosthesis appropriately positioned in the L3-4 disc space. Electronically Signed   By: Evangeline Dakin M.D.   On: 01/24/2017 16:05   Dg C-arm 1-60 Min  Result Date: 01/24/2017 CLINICAL DATA:  L3-4 fusion.  Prior L4-5 and L5-S1 fusion. EXAM: Operative LUMBAR SPINE - 2-3 VIEW COMPARISON:  Lumbar CT myelogram 06/09/2016. Intraoperative lumbar spine images 03/05/2015. FINDINGS: AP and lateral images of the lumbar spine obtained with the C-arm fluoroscopic device are submitted for interpretation postoperatively. Interbody fusion prosthesis is appropriately positioned in the L3-4 disc space. Bilateral pedicle screws are present at L3. The mild compression fracture of the upper endplate of L3 is again noted. The radiologic technologist documented 14 seconds of fluoroscopy time. IMPRESSION: Images obtained during L3-4 fusion demonstrating bilateral L3 pedicle screws and an  interbody fusion prosthesis appropriately positioned in the L3-4 disc space. Electronically Signed   By: Evangeline Dakin M.D.   On: 01/24/2017 16:05     Assessment/Plan   ICD-10-CM   1. Difficulty voiding - failing to change as expected R39.198   2. Enlarged prostate with urinary obstruction N40.1    N13.8   3. S/P lumbar spine operation Z98.890    01/24/17 for L3-L4 decompression, instrumentation and fusion  4. Right leg pain M79.604   5. Constipation due to opioid therapy K59.03    T40.2X5A   6. Chronic hepatitis C without hepatic coma ([place]) - treated with Harvoni B18.2   7. Anxiety F41.9   8. History of alcohol abuse Z87.898   9. History of cocaine abuse Z87.898     Obtain bladder scan/US to evaluate for urinary retention  Insert foley cath and DTG for urinary retention  Refer to urology for difficulty voiding; urinary retention  F/u with neurosx as scheduled. May need appt sooner if bladder sx's continue &/or bleeding continues  Check UA c&s  Cont other meds as ordered  PT/OT/ST as ordered  GOAL: short term rehab and d/c home when medically appropriate. Communicated with pt and nursing.  Will follow  Karthikeya Funke S. Perlie Gold  [facility] [address] [place], [place] [address] [phone] Cell (Monday-Friday 8 AM - 5 PM) [phone] After 5 PM and follow prompts

## 2017-01-31 NOTE — ED Provider Notes (Signed)
[facility] DEPT Provider Note   CSN: [id number] Arrival date & time: 01/31/17  0414     History   Chief Complaint Chief Complaint  Patient presents with  . Back Pain  . Post-op Problem    HPI [name] is a 66 y.o. male.  The history is provided by the patient.  He had back surgery 5 days ago and had been doing reasonably well until this evening when he started having increased pain in the lower back which is radiating down his right leg. Before the surgery, he had pain radiating down his left leg. His left leg is doing well. He denies any saddle anesthesia. Denies difficulty with bowels or bladder. He has taken Aleve at home with slight relief of pain. He had a prescription for oxycodone 15 mg, but had not gone it filled. He currently rates pain at 9/10.  Past Medical History:  Diagnosis Date  . Alcohol abuse   . Anxiety   . Arthritis   . BPH (benign prostatic hyperplasia)   . Chronic hepatitis C without hepatic coma ([place]) FOLLOWED BY INFECTOUS DISEASE DR COMER   POSITIVE ANTIBODY TEST THIS YEAR 2016--  CURRENT TX ON HARVONI PO  . Chronic low back pain   . Collapsed lung    right .Marland Kitchen..15 yrs ago, fell and hit some bricks  . DDD (degenerative disc disease), lumbosacral   . Depression    no meds now- med used for bladder  . Foley catheter in place   . GERD (gastroesophageal reflux disease)    hx none in long time  . History of cocaine abuse    per pt quit and last used 06-03-2013  . Urinary retention     Patient Active Problem List   Diagnosis Date Noted  . Constipation due to opioid therapy 01/27/2017  . Lumbar stenosis with neurogenic claudication 01/24/2017  . Enlarged prostate with urinary obstruction 06/16/2015  . Lumbar degenerative disc disease 03/05/2015  . Chronic hepatitis C without hepatic coma ([place]) 12/09/2014  . Chronic low back pain 01/17/2014  . Osteoarthritis of left knee 09/21/2013  . Alcohol dependence ([place]) 06/12/2013  . Cocaine dependence  ([place]) 06/12/2013  . Substance induced mood disorder ([place]) 06/12/2013    Past Surgical History:  Procedure Laterality Date  . APPENDECTOMY  1980's  . BACK SURGERY    . CERVICAL DISC SURGERY    . CLOSED REDUCTION AND INTRAMAXILLARY FIXATION BILATERAL MENTAL FX'S/  PLACEMENT MAXILLARY STENT/ APERTURE WIRE PLACEMENT  01-04-2000  . JOINT REPLACEMENT    . POSTERIOR LUMBAR FUSION  03-05-2015   laminectomy /  nerve decompression--  L4 -- S1  . TOTAL KNEE ARTHROPLASTY Left 09/21/2013   Procedure: LEFT TOTAL KNEE ARTHROPLASTY;  Surgeon: Yvette Rack., MD;  Location: [facility];  Service: Orthopedics;  Laterality: Left;  . TRANSURETHRAL RESECTION OF PROSTATE N/A 06/16/2015   Procedure: TRANSURETHRAL RESECTION OF THE PROSTATE WITH GYRUS INSTRUMENTS;  Surgeon: Franchot Gallo, MD;  Location: [facility];  Service: Urology;  Laterality: N/A;       Home Medications    Prior to Admission medications   Medication Sig Start Date End Date Taking? Authorizing Provider  cyclobenzaprine (FLEXERIL) 10 MG tablet Take 10 mg by mouth 3 (three) times daily as needed for muscle spasms.    [provider]  docusate sodium (COLACE) 100 MG capsule Take 1 capsule (100 mg total) by mouth 2 (two) times daily. 01/26/17   Newman Pies, MD  DULoxetine (CYMBALTA) 60  MG capsule Take 1 capsule (60 mg total) by mouth daily. 01/17/14   Marcial Pacas, MD  gabapentin (NEURONTIN) 300 MG capsule Take 300 mg by mouth at bedtime.    [provider]  oxyCODONE (ROXICODONE) 15 MG immediate release tablet Take 1 tablet (15 mg total) by mouth every 4 (four) hours as needed for moderate pain. 01/26/17   Newman Pies, MD  tamsulosin (FLOMAX) 0.4 MG CAPS capsule Take 1 capsule (0.4 mg total) by mouth daily. 01/27/17   Newman Pies, MD    Family History No family history on file.  Social History Social History  Substance Use Topics  . Smoking status: Never Smoker  . Smokeless tobacco: Never Used   . Alcohol use 3.6 oz/week    6 Cans of beer per week     Allergies   No known allergies   Review of Systems Review of Systems  All other systems reviewed and are negative.    Physical Exam Updated Vital Signs BP 124/69 (BP Location: Left Arm)   Pulse 83   Temp 97.7 F (36.5 C) (Oral)   Resp 18   SpO2 93%   Physical Exam  Nursing note and vitals reviewed.  66 year old male, resting comfortably and in no acute distress. Vital signs are normal. Oxygen saturation is 93%, which is normal. Head is normocephalic and atraumatic. PERRLA, EOMI. Oropharynx is clear. Neck is nontender and supple without adenopathy or JVD. Back: Dressing is present over lumbar surgical site and is not removed. There is moderate tenderness in this area. There is mild bilateral paralumbar spasm. Straight leg raise is positive on the right at 30. Lungs are clear without rales, wheezes, or rhonchi. Chest is nontender. Heart has regular rate and rhythm without murmur. Abdomen is soft, flat, nontender without masses or hepatosplenomegaly and peristalsis is normoactive. Extremities have no cyanosis or edema, full range of motion is present. Skin is warm and dry without rash. Neurologic: Mental status is normal, cranial nerves are intact, there are no motor or sensory deficits.  ED Treatments / Results   Procedures Procedures (including critical care time)  Medications Ordered in ED Medications  methocarbamol (ROBAXIN) 1,000 mg in dextrose 5 % 50 mL IVPB (0 mg Intravenous Stopped 01/31/17 0522)  morphine 4 MG/ML injection 4 mg (4 mg Intravenous Given 01/31/17 0458)     Initial Impression / Assessment and Plan / ED Course  I have reviewed the triage vital signs and the nursing notes.  Pertinent labs & imaging results that were available during my care of the patient were reviewed by me and considered in my medical decision making (see chart for details).  Increased low back pain with right-sided  sciatica in postoperative period. Old records are reviewed, and he did have a lumbar fusion performed on June 25. No objective neurologic deficit. I suspect that this is normal postoperative pain. He does not have his narcotic prescription at home. I do not see any indication for acute imaging. He'll be given intravenous methocarbamol and morphine and reassessed.  He feels much better following above-noted treatment. He has a prescription for oxycodone which has not yet been filled. He is encouraged to fill it. He is given a prescription for orphenadrine and is to follow-up with his neurosurgeon.  Final Clinical Impressions(s) / ED Diagnoses   Final diagnoses:  Post-op pain    New Prescriptions New Prescriptions   No medications on file     Delora Fuel, MD 40/08/67 [phone]

## 2017-01-31 NOTE — ED Triage Notes (Signed)
Patient is s/p lumbar surgery, bleeding from incision site, bilateral leg pain that started around 2am this morning.  Patient is CAOx4.  Surgery was June 25th.

## 2017-01-31 NOTE — ED Notes (Signed)
Bed: YO45 Expected date:  Expected time:  Means of arrival:  Comments: EMS-abd pain

## 2017-01-31 NOTE — ED Provider Notes (Signed)
[facility] DEPT Provider Note   CSN: [id number] Arrival date & time: 01/31/17  2008     History   Chief Complaint Chief Complaint  Patient presents with  . Urinary Retention    HPI [name] is a 66 y.o. male.  HPI  66 year old male with past medical history as below including history of BPH and recent lumbar surgery who presents with urinary retention. Patient underwent lumbar fusion and arthrodesis on 6/25. He states he was recovering very well until the last 2-3 days. Over this time period, the patient has had progressively worsening difficulty voiding. He states that he feels as though he has to strain to urinate and subsequently has developed aching, throbbing, fall, abdominal pain. He was seen by his primary care doctor yesterday and a Foley catheter was placed. The catheter apparently became clotted with a blood clot and he had no urine output throughout the day. He was subsequent sent for evaluation today. Currently, the patient versus abdominal fullness and tenderness. No nausea or vomiting. He has back pain but states this is improving since his surgery. He does admit to a small amount of clear drainage from his operative site that is being followed by his doctor.   Past Medical History:  Diagnosis Date  . Alcohol abuse   . Anxiety   . Arthritis   . BPH (benign prostatic hyperplasia)   . Chronic hepatitis C without hepatic coma ([place]) FOLLOWED BY INFECTOUS DISEASE DR COMER   POSITIVE ANTIBODY TEST THIS YEAR 2016--  CURRENT TX ON HARVONI PO  . Chronic low back pain   . Collapsed lung    right .Marland Kitchen..15 yrs ago, fell and hit some bricks  . DDD (degenerative disc disease), lumbosacral   . Depression    no meds now- med used for bladder  . Foley catheter in place   . GERD (gastroesophageal reflux disease)    hx none in long time  . History of cocaine abuse    per pt quit and last used 06-03-2013  . Urinary retention     Patient Active Problem List   Diagnosis  Date Noted  . Constipation due to opioid therapy 01/27/2017  . Lumbar stenosis with neurogenic claudication 01/24/2017  . Enlarged prostate with urinary obstruction 06/16/2015  . Lumbar degenerative disc disease 03/05/2015  . Chronic hepatitis C without hepatic coma ([place]) 12/09/2014  . Chronic low back pain 01/17/2014  . Osteoarthritis of left knee 09/21/2013  . Alcohol dependence ([place]) 06/12/2013  . Cocaine dependence ([place]) 06/12/2013  . Substance induced mood disorder ([place]) 06/12/2013    Past Surgical History:  Procedure Laterality Date  . APPENDECTOMY  1980's  . BACK SURGERY    . CERVICAL DISC SURGERY    . CLOSED REDUCTION AND INTRAMAXILLARY FIXATION BILATERAL MENTAL FX'S/  PLACEMENT MAXILLARY STENT/ APERTURE WIRE PLACEMENT  01-04-2000  . JOINT REPLACEMENT    . POSTERIOR LUMBAR FUSION  03-05-2015   laminectomy /  nerve decompression--  L4 -- S1  . TOTAL KNEE ARTHROPLASTY Left 09/21/2013   Procedure: LEFT TOTAL KNEE ARTHROPLASTY;  Surgeon: Yvette Rack., MD;  Location: [facility];  Service: Orthopedics;  Laterality: Left;  . TRANSURETHRAL RESECTION OF PROSTATE N/A 06/16/2015   Procedure: TRANSURETHRAL RESECTION OF THE PROSTATE WITH GYRUS INSTRUMENTS;  Surgeon: Franchot Gallo, MD;  Location: [facility];  Service: Urology;  Laterality: N/A;       Home Medications    Prior to Admission medications   Medication Sig Start Date End  Date Taking? Authorizing Provider  docusate sodium (COLACE) 100 MG capsule Take 1 capsule (100 mg total) by mouth 2 (two) times daily. 01/26/17  Yes Newman Pies, MD  DULoxetine (CYMBALTA) 60 MG capsule Take 1 capsule (60 mg total) by mouth daily. 01/17/14  Yes Marcial Pacas, MD  gabapentin (NEURONTIN) 300 MG capsule Take 300 mg by mouth at bedtime.   Yes [provider]  orphenadrine (NORFLEX) 100 MG tablet Take 1 tablet (100 mg total) by mouth 2 (two) times daily. 08/06/38  Yes Delora Fuel, MD  oxyCODONE (ROXICODONE) 15 MG  immediate release tablet Take 1 tablet (15 mg total) by mouth every 4 (four) hours as needed for moderate pain. 01/26/17  Yes Newman Pies, MD  tamsulosin (FLOMAX) 0.4 MG CAPS capsule Take 1 capsule (0.4 mg total) by mouth daily. 02/01/17   Duffy Bruce, MD    Family History History reviewed. No pertinent family history.  Social History Social History  Substance Use Topics  . Smoking status: Never Smoker  . Smokeless tobacco: Never Used  . Alcohol use 3.6 oz/week    6 Cans of beer per week     Allergies   No known allergies   Review of Systems Review of Systems  Constitutional: Negative for chills, fatigue and fever.  HENT: Negative for congestion and rhinorrhea.   Eyes: Negative for visual disturbance.  Respiratory: Negative for cough, shortness of breath and wheezing.   Cardiovascular: Negative for chest pain and leg swelling.  Gastrointestinal: Positive for abdominal distention, abdominal pain and nausea. Negative for diarrhea and vomiting.  Genitourinary: Positive for difficulty urinating. Negative for dysuria and flank pain.  Musculoskeletal: Negative for neck pain and neck stiffness.  Skin: Negative for rash and wound.  Allergic/Immunologic: Negative for immunocompromised state.  Neurological: Negative for syncope, weakness and headaches.  All other systems reviewed and are negative.    Physical Exam Updated Vital Signs BP 115/78 (BP Location: Left Arm)   Pulse 83   Temp 98.8 F (37.1 C) (Oral)   Resp 18   Ht 6' (1.829 m)   Wt 84.8 kg (187 lb)   SpO2 99%   BMI 25.36 kg/m   Physical Exam  Constitutional: He is oriented to person, place, and time. He appears well-developed and well-nourished. No distress.  HENT:  Head: Normocephalic and atraumatic.  Eyes: Conjunctivae are normal.  Neck: Neck supple.  Cardiovascular: Normal rate, regular rhythm and normal heart sounds.  Exam reveals no friction rub.   No murmur heard. Pulmonary/Chest: Effort normal  and breath sounds normal. No respiratory distress. He has no wheezes. He has no rales.  Abdominal: Soft. He exhibits no distension. There is tenderness.  Significant suprapubic tenderness and fullness. No rebound or guarding.  Musculoskeletal: He exhibits no edema.  Lumbar operative site appears clean, dry, and intact with Steri-Strips in place. There is a small amount of serous, dried drainage along the inferior aspect of the wound with no active bleeding. No erythema or fluctuance. No expressible purulence.  Neurological: He is alert and oriented to person, place, and time. He exhibits normal muscle tone.  Skin: Skin is warm. Capillary refill takes less than 2 seconds.  Psychiatric: He has a normal mood and affect.  Nursing note and vitals reviewed.    ED Treatments / Results  Labs (all labs ordered are listed, but only abnormal results are displayed) Labs Reviewed  CBC WITH DIFFERENTIAL/PLATELET - Abnormal; Notable for the following:       Result Value  RBC 4.18 (*)    HCT 37.8 (*)    All other components within normal limits  COMPREHENSIVE METABOLIC PANEL - Abnormal; Notable for the following:    Chloride 99 (*)    Albumin 3.4 (*)    ALT 15 (*)    All other components within normal limits  URINALYSIS, ROUTINE W REFLEX MICROSCOPIC - Abnormal; Notable for the following:    Color, Urine STRAW (*)    Hgb urine dipstick SMALL (*)    All other components within normal limits  LIPASE, BLOOD    EKG  EKG Interpretation None       Radiology Ct Abdomen Pelvis W Contrast  Result Date: 01/31/2017 CLINICAL DATA:  Acute onset of generalized abdominal pain and urinary retention. Initial encounter. EXAM: CT ABDOMEN AND PELVIS WITH CONTRAST TECHNIQUE: Multidetector CT imaging of the abdomen and pelvis was performed using the standard protocol following bolus administration of intravenous contrast. CONTRAST:  15mL ISOVUE-300 IOPAMIDOL (ISOVUE-300) INJECTION 61% COMPARISON:  CT dated  lumbar spine performed 06/09/2016 FINDINGS: Lower chest: Minimal bibasilar atelectasis or scarring is noted. The visualized portions of the mediastinum are unremarkable. Hepatobiliary: The liver is unremarkable in appearance. The gallbladder is unremarkable in appearance. The common bile duct remains normal in caliber. Pancreas: The pancreas is within normal limits. Spleen: The spleen is unremarkable in appearance. Adrenals/Urinary Tract: There is mild wall thickening at the left renal pelvis, raising question for mild ureteritis. A 4 mm nonobstructing stone is noted at the upper pole of the left kidney. The kidneys are otherwise unremarkable. There is no evidence of hydronephrosis. No obstructing ureteral stones are identified. No perinephric stranding is appreciated. The adrenal glands are unremarkable in appearance. Stomach/Bowel: The stomach is unremarkable in appearance. The small bowel is within normal limits. The patient is status post appendectomy. The colon is unremarkable in appearance. Vascular/Lymphatic: The abdominal aorta is unremarkable in appearance. The inferior vena cava is grossly unremarkable. No retroperitoneal lymphadenopathy is seen. No pelvic sidewall lymphadenopathy is identified. Reproductive: The bladder is significantly distended and grossly unremarkable. The prostate remains normal in size, with minimal calcification. Other: No additional soft tissue abnormalities are seen. Musculoskeletal: No acute osseous abnormalities are identified. The patient is status post lumbar spinal fusion at L3-S1, with chronic compression deformity of L3. The visualized musculature is unremarkable in appearance. A new 12 cm collection of fluid is noted posterior to the lumbar spine, with mild surrounding soft tissue inflammation, possibly reflecting evolving postoperative seroma given recent surgery. IMPRESSION: 1. Mild wall thickening at the left renal pelvis raises question for mild ureteritis. 2. 4 mm  nonobstructing stone at the upper pole of the left kidney. 3. New 12 cm collection of fluid noted posterior to the lumbar spine, with mild surrounding soft tissue inflammation, possibly reflecting evolving postoperative seroma given recent surgery. Electronically Signed   By: Garald Balding M.D.   On: 01/31/2017 22:15    Procedures Procedures (including critical care time)  Medications Ordered in ED Medications  morphine 2 MG/ML injection 6 mg (6 mg Intravenous Given 01/31/17 2100)  ondansetron (ZOFRAN) injection 4 mg (4 mg Intravenous Given 01/31/17 2059)  sodium chloride 0.9 % bolus 1,000 mL (0 mLs Intravenous Stopped 01/31/17 2314)  iopamidol (ISOVUE-300) 61 % injection 100 mL (100 mLs Intravenous Contrast Given 01/31/17 2155)  morphine 2 MG/ML injection 4 mg (4 mg Intravenous Given 02/01/17 0012)  sodium chloride 0.9 % bolus 1,000 mL (0 mLs Intravenous Stopped 02/01/17 0152)  oxyCODONE (Oxy IR/ROXICODONE) immediate release tablet  10 mg (10 mg Oral Given 02/01/17 0012)     Initial Impression / Assessment and Plan / ED Course  I have reviewed the triage vital signs and the nursing notes.  Pertinent labs & imaging results that were available during my care of the patient were reviewed by me and considered in my medical decision making (see chart for details).    66 year old male here with postoperative urinary retention and abdominal distention. He has a history of BPH I suspect this is secondary to his catheterization during his surgery and hospitalization. I suspect his decreased urine output from earlier today was secondary to an obstructed catheter as a catheter was placed today with return of over 1.5 L of urine. Patient given 2 L to accommodate for these urinary losses. Electrolytes and lab work is otherwise largely unremarkable. He has no evidence of UTI. CT scan obtained given his significant degree of pain, and shows no acute labnormality. He does have a postoperative seroma but no apparent  evidence of infection and I suspect this will drain as the wound is currently draining a small amount of serous drainage that does not appear infected. There is no wound dehiscence. His back pain is actually improving. He has no lower extremities weakness, numbness, or evidence to suggest cord compression or cauda equina. Will d/c with foley in place, advise bowel regimen, and outpt follow-up.  Final Clinical Impressions(s) / ED Diagnoses   Final diagnoses:  Acute urinary retention    New Prescriptions Discharge Medication List as of 02/01/2017 12:09 AM       Duffy Bruce, MD 02/02/17 1104

## 2017-01-31 NOTE — ED Triage Notes (Signed)
Patient comes from [facility] for abdominal pain /possible urinary retention. EMS reports pt had a catheter in place that was not having output. facility removed catheter, a blood clot was noted upon removal. Facility was unsuccessful when attempting to reinsert. Patient had recent lower lumbar surgery.

## 2017-01-31 NOTE — ED Notes (Signed)
[facility] called for transport.  

## 2017-02-01 MED ORDER — TAMSULOSIN HCL 0.4 MG PO CAPS: 0.4000 mg | ORAL_CAPSULE | Freq: Every day | ORAL | 0 refills | Status: DC

## 2017-02-01 MED ORDER — OXYCODONE HCL 5 MG PO TABS
10.0000 mg | ORAL_TABLET | Freq: Once | ORAL | Status: AC
  Administered 2017-02-01: 10 mg via ORAL
  Filled 2017-02-01: qty 2

## 2017-02-01 NOTE — ED Notes (Signed)
[facility] [employer] notified of need for transport of pt back to residence.  

## 2017-02-01 NOTE — Discharge Instructions (Signed)
I suspect [name] abdominal pain is due to urinary retention.  He should keep the foley in place AT [place] for the next 3-5 days, then follow-up with a Urologist for removal. Continue Flomax 0.4 mg daily.  Continue pain medications as prescribed

## 2017-02-03 ENCOUNTER — Encounter: Payer: Self-pay | Admitting: Adult Health

## 2017-02-03 ENCOUNTER — Non-Acute Institutional Stay (SKILLED_NURSING_FACILITY): Payer: Medicaid Other | Admitting: Adult Health

## 2017-02-03 DIAGNOSIS — N401 Enlarged prostate with lower urinary tract symptoms: Secondary | ICD-10-CM

## 2017-02-03 DIAGNOSIS — N138 Other obstructive and reflux uropathy: Secondary | ICD-10-CM

## 2017-02-03 DIAGNOSIS — M5136 Other intervertebral disc degeneration, lumbar region: Secondary | ICD-10-CM

## 2017-02-03 NOTE — Progress Notes (Signed)
Location:   [facility] Room Number: 127 A Place of Service:  SNF (31)   CODE STATUS: Full Code  Allergies  Allergen Reactions  . No Known Allergies     Chief Complaint  Patient presents with  . Acute Visit    ED Follow up    HPI:  He has recently has lumbar fusion on 01-24-17. He was taken to the ED due to inability to void. He did have a foley inserted in this facility; however the foley became clogged.  He has returned with the foley. He tells me that he is feeling much better and is having less back pain.    Past Medical History:  Diagnosis Date  . Alcohol abuse   . Anxiety   . Arthritis   . BPH (benign prostatic hyperplasia)   . Chronic hepatitis C without hepatic coma ([place]) FOLLOWED BY INFECTOUS DISEASE DR COMER   POSITIVE ANTIBODY TEST THIS YEAR 2016--  CURRENT TX ON HARVONI PO  . Chronic low back pain   . Collapsed lung    right .Marland Kitchen..15 yrs ago, fell and hit some bricks  . DDD (degenerative disc disease), lumbosacral   . Depression    no meds now- med used for bladder  . Foley catheter in place   . GERD (gastroesophageal reflux disease)    hx none in long time  . History of cocaine abuse    per pt quit and last used 06-03-2013  . Urinary retention     Past Surgical History:  Procedure Laterality Date  . APPENDECTOMY  1980's  . BACK SURGERY    . CERVICAL DISC SURGERY    . CLOSED REDUCTION AND INTRAMAXILLARY FIXATION BILATERAL MENTAL FX'S/  PLACEMENT MAXILLARY STENT/ APERTURE WIRE PLACEMENT  01-04-2000  . JOINT REPLACEMENT    . POSTERIOR LUMBAR FUSION  03-05-2015   laminectomy /  nerve decompression--  L4 -- S1  . TOTAL KNEE ARTHROPLASTY Left 09/21/2013   Procedure: LEFT TOTAL KNEE ARTHROPLASTY;  Surgeon: Yvette Rack., MD;  Location: [facility];  Service: Orthopedics;  Laterality: Left;  . TRANSURETHRAL RESECTION OF PROSTATE N/A 06/16/2015   Procedure: TRANSURETHRAL RESECTION OF THE PROSTATE WITH GYRUS INSTRUMENTS;  Surgeon: Franchot Gallo,  MD;  Location: [facility];  Service: Urology;  Laterality: N/A;    Social History   Social History  . Marital status: Divorced    Spouse name: N/A  . Number of children: N/A  . Years of education: N/A   Occupational History  . Not on file.   Social History Main Topics  . Smoking status: Never Smoker  . Smokeless tobacco: Never Used  . Alcohol use 3.6 oz/week    6 Cans of beer per week  . Drug use: No     Comment: per pt quit and last used crack 06-03-2013  . Sexual activity: Not on file   Other Topics Concern  . Not on file   Social History Narrative  . No narrative on file   History reviewed. No pertinent family history.    VITAL SIGNS BP 129/83   Pulse 70   Temp 98.1 F (36.7 C)   Resp 16   Ht 6\' 2"  (1.88 m)   Wt 187 lb 12.8 oz (85.2 kg)   SpO2 97%   BMI 24.11 kg/m   Patient's Medications  New Prescriptions   No medications on file  Previous Medications   DOCUSATE SODIUM (COLACE) 100 MG CAPSULE    Take 1 capsule (  100 mg total) by mouth 2 (two) times daily.   DULOXETINE (CYMBALTA) 60 MG CAPSULE    Take 1 capsule (60 mg total) by mouth daily.   GABAPENTIN (NEURONTIN) 300 MG CAPSULE    Take 300 mg by mouth at bedtime.   ORPHENADRINE (NORFLEX) 100 MG TABLET    Take 1 tablet (100 mg total) by mouth 2 (two) times daily.   OXYCODONE (ROXICODONE) 15 MG IMMEDIATE RELEASE TABLET    Take 1 tablet (15 mg total) by mouth every 4 (four) hours as needed for moderate pain.   TAMSULOSIN (FLOMAX) 0.4 MG CAPS CAPSULE    Take 1 capsule (0.4 mg total) by mouth daily.  Modified Medications   No medications on file  Discontinued Medications   No medications on file     SIGNIFICANT DIAGNOSTIC EXAMS   01-24-17: lumbar x-ray: Images obtained during L3-4 fusion demonstrating bilateral L3 pedicle screws and an interbody fusion prosthesis appropriately positioned in the L3-4 disc space.   LABS REVIEWED:   01-17-17: wbc 8.0; hgb 16.7; hct 48.8; mcv 92.1; plt  238; glucose 93; bun 9; creat 1.09; k+ 4.5; na++ 139; ca 9.8; liver normal albumin 4.2    Review of Systems  Constitutional: Negative for malaise/fatigue.  Respiratory: Negative for cough and shortness of breath.   Cardiovascular: Negative for chest pain, palpitations and leg swelling.  Gastrointestinal: Negative for abdominal pain, constipation and heartburn.  Genitourinary:       Has foley and is feeling better   Musculoskeletal: Negative for back pain, joint pain and myalgias.  Skin: Negative.   Neurological: Negative for dizziness.  Psychiatric/Behavioral: The patient is not nervous/anxious.     Physical Exam  Constitutional: He is oriented to person, place, and time. No distress.  Eyes: Conjunctivae are normal.  Neck: Neck supple. No JVD present. No thyromegaly present.  Cardiovascular: Normal rate, regular rhythm and intact distal pulses.   Respiratory: Effort normal and breath sounds normal. No respiratory distress. He has no wheezes.  GI: Soft. Bowel sounds are normal. He exhibits no distension. There is no tenderness.  Genitourinary:  Genitourinary Comments: Has foley   Musculoskeletal: He exhibits no edema.  Able to move all extremities   Lymphadenopathy:    He has no cervical adenopathy.  Neurological: He is alert and oriented to person, place, and time.  Skin: Skin is warm and dry. He is not diaphoretic.  Incision line without signs of infection present   Psychiatric: He has a normal mood and affect.    ASSESSMENT/ PLAN:  1. Urinary retention: will keep foley and will setup a urology consult. Will continue to monitor   2. Lumbar degenerative disease: is status post lumbar fusion: will continue current plan of care and will monitor   Time spent with patient  40   minutes >50% time spent counseling; reviewing medical record; tests; labs; and developing future plan of care   MD is aware of resident's narcotic use and is in agreement with current plan of care. We  will attempt to wean resident as apropriate   Ok Edwards NP [facility]  Contact [phone] Monday through Friday 8am- 5pm  After hours call [phone]

## 2017-02-07 DIAGNOSIS — M4326 Fusion of spine, lumbar region: Secondary | ICD-10-CM | POA: Insufficient documentation

## 2017-02-10 ENCOUNTER — Non-Acute Institutional Stay (SKILLED_NURSING_FACILITY): Payer: Medicaid Other | Admitting: Adult Health

## 2017-02-10 ENCOUNTER — Encounter: Payer: Self-pay | Admitting: Adult Health

## 2017-02-10 DIAGNOSIS — H8110 Benign paroxysmal vertigo, unspecified ear: Secondary | ICD-10-CM | POA: Diagnosis not present

## 2017-02-10 NOTE — Progress Notes (Signed)
Location:   [facility] Room Number: 127 A  Place of Service:  SNF (31)   CODE STATUS: full code   Allergies  Allergen Reactions  . No Known Allergies     Chief Complaint  Patient presents with  . Acute Visit    Blood Pressure changes    HPI:  For the past couple of days he has been having vertigo. He tells me that the room is spinning. The vertigo gets better when he is laying still and moving makes the vertigo worse. He has not had any syncopal episodes. He denies any chest pain or shortness of breath He has been orthostatic: laying  B/p 110/68 P 84; sitting b/p 108/70 P 81; standing: b/pp 88/50 P 88   Past Medical History:  Diagnosis Date  . Alcohol abuse   . Anxiety   . Arthritis   . BPH (benign prostatic hyperplasia)   . Chronic hepatitis C without hepatic coma ([facility]) FOLLOWED BY INFECTOUS DISEASE DR COMER   POSITIVE ANTIBODY TEST THIS YEAR 2016--  CURRENT TX ON HARVONI PO  . Chronic low back pain   . Collapsed lung    right .Marland Kitchen..15 yrs ago, fell and hit some bricks  . DDD (degenerative disc disease), lumbosacral   . Depression    no meds now- med used for bladder  . Foley catheter in place   . GERD (gastroesophageal reflux disease)    hx none in long time  . History of cocaine abuse    per pt quit and last used 06-03-2013  . Urinary retention     Past Surgical History:  Procedure Laterality Date  . APPENDECTOMY  1980's  . BACK SURGERY    . CERVICAL DISC SURGERY    . CLOSED REDUCTION AND INTRAMAXILLARY FIXATION BILATERAL MENTAL FX'S/  PLACEMENT MAXILLARY STENT/ APERTURE WIRE PLACEMENT  01-04-2000  . JOINT REPLACEMENT    . POSTERIOR LUMBAR FUSION  03-05-2015   laminectomy /  nerve decompression--  L4 -- S1  . TOTAL KNEE ARTHROPLASTY Left 09/21/2013   Procedure: LEFT TOTAL KNEE ARTHROPLASTY;  Surgeon: Yvette Rack., MD;  Location: [place];  Service: Orthopedics;  Laterality: Left;  . TRANSURETHRAL RESECTION OF PROSTATE N/A 06/16/2015   Procedure: TRANSURETHRAL RESECTION OF THE PROSTATE WITH GYRUS INSTRUMENTS;  Surgeon: Franchot Gallo, MD;  Location: [facility];  Service: Urology;  Laterality: N/A;    Social History   Social History  . Marital status: Divorced    Spouse name: N/A  . Number of children: N/A  . Years of education: N/A   Occupational History  . Not on file.   Social History Main Topics  . Smoking status: Never Smoker  . Smokeless tobacco: Never Used  . Alcohol use 3.6 oz/week    6 Cans of beer per week  . Drug use: No     Comment: per pt quit and last used crack 06-03-2013  . Sexual activity: Not on file   Other Topics Concern  . Not on file   Social History Narrative  . No narrative on file   History reviewed. No pertinent family history.    VITAL SIGNS Ht 6\' 2"  (1.88 m)   Wt 187 lb (84.8 kg)   BMI 24.01 kg/m   Patient's Medications  New Prescriptions   No medications on file  Previous Medications   DOCUSATE SODIUM (COLACE) 100 MG CAPSULE    Take 1 capsule (100 mg total) by mouth 2 (two) times  daily.   DULOXETINE (CYMBALTA) 60 MG CAPSULE    Take 1 capsule (60 mg total) by mouth daily.   GABAPENTIN (NEURONTIN) 300 MG CAPSULE    Take 300 mg by mouth at bedtime.   ORPHENADRINE (NORFLEX) 100 MG TABLET    Take 1 tablet (100 mg total) by mouth 2 (two) times daily.   OXYCODONE (ROXICODONE) 15 MG IMMEDIATE RELEASE TABLET    Take 1 tablet (15 mg total) by mouth every 4 (four) hours as needed for moderate pain.   PHENYLEPHRINE-SHARK LIVER OIL-MINERAL OIL-PETROLATUM (PREPARATION H) 0.25-14-74.9 % RECTAL OINTMENT    Place 1 application rectally 2 (two) times daily.   TAMSULOSIN (FLOMAX) 0.4 MG CAPS CAPSULE    Take 1 capsule (0.4 mg total) by mouth daily.  Modified Medications   No medications on file  Discontinued Medications   METHYLPREDNISOLONE (MEDROL DOSEPAK) 4 MG TBPK TABLET    Take 4 mg by mouth.     SIGNIFICANT DIAGNOSTIC EXAMS  PREVIOUS   01-24-17: lumbar  x-ray: Images obtained during L3-4 fusion demonstrating bilateral L3 pedicle screws and an interbody fusion prosthesis appropriately positioned in the L3-4 disc space.   NO NEW EXAMS   LABS REVIEWED: PREVIOUS   01-17-17: wbc 8.0; hgb 16.7; hct 48.8; mcv 92.1; plt 238; glucose 93; bun 9; creat 1.09; k+ 4.5; na++ 139; ca 9.8; liver normal albumin 4.2   NO NEW LABS     Review of Systems  Constitutional: Negative for malaise/fatigue.  Respiratory: Negative for cough and shortness of breath.   Cardiovascular: Negative for chest pain, palpitations and leg swelling.  Gastrointestinal: Negative for abdominal pain, constipation and heartburn.  Musculoskeletal: Positive for back pain. Negative for joint pain and myalgias.  Skin: Negative.   Neurological: Positive for dizziness.  Psychiatric/Behavioral: The patient is not nervous/anxious.     Physical Exam  Constitutional: He is oriented to person, place, and time. He appears well-developed and well-nourished. No distress.  Eyes: Conjunctivae are normal.  Neck: Neck supple. No JVD present. No thyromegaly present.  Cardiovascular: Normal rate, regular rhythm and intact distal pulses.   Respiratory: Effort normal and breath sounds normal. No respiratory distress. He has no wheezes.  GI: Soft. Bowel sounds are normal. He exhibits no distension. There is no tenderness.  Musculoskeletal: He exhibits no edema.  Able to move all extremities   Lymphadenopathy:    He has no cervical adenopathy.  Neurological: He is alert and oriented to person, place, and time. He displays no atrophy and no tremor. No cranial nerve deficit or sensory deficit. He exhibits normal muscle tone. He displays a negative Romberg sign.  Negative Guillot pike maneuver   Skin: Skin is warm and dry. He is not diaphoretic.  Psychiatric: He has a normal mood and affect.    ASSESSMENT/ PLAN:  1. Benign positional vertigo: does have a component of orthostatic blood pressure: will  begin meclizine 25 mg every 6 hours for 72 hours then every 6 hours as needed. Will continue to monitor his status  He is ambulating independently through out the facility easily .   MD is aware of resident's narcotic use and is in agreement with current plan of care. We will attempt to wean resident as apropriate   Ok Edwards NP [facility]  Contact [phone] Monday through Friday 8am- 5pm  After hours call [phone]

## 2017-02-22 ENCOUNTER — Encounter: Payer: Self-pay | Admitting: Adult Health

## 2017-02-22 ENCOUNTER — Non-Acute Institutional Stay (SKILLED_NURSING_FACILITY): Payer: Medicaid Other | Admitting: Adult Health

## 2017-02-22 DIAGNOSIS — N138 Other obstructive and reflux uropathy: Secondary | ICD-10-CM

## 2017-02-22 DIAGNOSIS — T402X5A Adverse effect of other opioids, initial encounter: Secondary | ICD-10-CM | POA: Diagnosis not present

## 2017-02-22 DIAGNOSIS — F1994 Other psychoactive substance use, unspecified with psychoactive substance-induced mood disorder: Secondary | ICD-10-CM

## 2017-02-22 DIAGNOSIS — K5903 Drug induced constipation: Secondary | ICD-10-CM

## 2017-02-22 DIAGNOSIS — N401 Enlarged prostate with lower urinary tract symptoms: Secondary | ICD-10-CM

## 2017-02-22 DIAGNOSIS — M48062 Spinal stenosis, lumbar region with neurogenic claudication: Secondary | ICD-10-CM

## 2017-02-22 NOTE — Progress Notes (Signed)
Location:   [place] Room Number: 127 A Place of Service:  SNF (31)   CODE STATUS: Full Code  Allergies  Allergen Reactions  . No Known Allergies     Chief Complaint  Patient presents with  . Medical Management of Chronic Issues    1 month follow up    HPI:  He is a 66 year old short term resident of this facility being seen for the management of his chronic illnesses: lumbar stenosis; mood disorder; bph; constipation. He is doing well; he is ambulating independently. He says that he does have some occasional vertigo; but is getting better. He is independent with his adls. There are no nursing concerns at this time.  He has seen urology and has had his foley removed. There are no nursing concerns at this time.    Past Medical History:  Diagnosis Date  . Alcohol abuse   . Anxiety   . Arthritis   . BPH (benign prostatic hyperplasia)   . Chronic hepatitis C without hepatic coma ([place]) FOLLOWED BY INFECTOUS DISEASE DR COMER   POSITIVE ANTIBODY TEST THIS YEAR 2016--  CURRENT TX ON HARVONI PO  . Chronic low back pain   . Collapsed lung    right .Marland Kitchen..15 yrs ago, fell and hit some bricks  . DDD (degenerative disc disease), lumbosacral   . Depression    no meds now- med used for bladder  . Foley catheter in place   . GERD (gastroesophageal reflux disease)    hx none in long time  . History of cocaine abuse    per pt quit and last used 06-03-2013  . Urinary retention     Past Surgical History:  Procedure Laterality Date  . APPENDECTOMY  1980's  . BACK SURGERY    . CERVICAL DISC SURGERY    . CLOSED REDUCTION AND INTRAMAXILLARY FIXATION BILATERAL MENTAL FX'S/  PLACEMENT MAXILLARY STENT/ APERTURE WIRE PLACEMENT  01-04-2000  . JOINT REPLACEMENT    . POSTERIOR LUMBAR FUSION  03-05-2015   laminectomy /  nerve decompression--  L4 -- S1  . TOTAL KNEE ARTHROPLASTY Left 09/21/2013   Procedure: LEFT TOTAL KNEE ARTHROPLASTY;  Surgeon: Yvette Rack., MD;  Location: [place];  Service: Orthopedics;  Laterality: Left;  . TRANSURETHRAL RESECTION OF PROSTATE N/A 06/16/2015   Procedure: TRANSURETHRAL RESECTION OF THE PROSTATE WITH GYRUS INSTRUMENTS;  Surgeon: Franchot Gallo, MD;  Location: [facility];  Service: Urology;  Laterality: N/A;    Social History   Social History  . Marital status: Divorced    Spouse name: N/A  . Number of children: N/A  . Years of education: N/A   Occupational History  . Not on file.   Social History Main Topics  . Smoking status: Never Smoker  . Smokeless tobacco: Never Used  . Alcohol use 3.6 oz/week    6 Cans of beer per week  . Drug use: No     Comment: per pt quit and last used crack 06-03-2013  . Sexual activity: Not on file   Other Topics Concern  . Not on file   Social History Narrative  . No narrative on file   History reviewed. No pertinent family history.    VITAL SIGNS BP 99/69   Pulse 78   Temp 98 F (36.7 C)   Resp 20   Ht 6\' 2"  (1.88 m)   Wt 187 lb 12.8 oz (85.2 kg)   SpO2 97%   BMI 24.11 kg/m  Patient's Medications  New Prescriptions   No medications on file  Previous Medications   DOCUSATE SODIUM (COLACE) 100 MG CAPSULE    Take 1 capsule (100 mg total) by mouth 2 (two) times daily.   DULOXETINE (CYMBALTA) 60 MG CAPSULE    Take 1 capsule (60 mg total) by mouth daily.   GABAPENTIN (NEURONTIN) 300 MG CAPSULE    Take 300 mg by mouth at bedtime.   MECLIZINE (ANTIVERT) 25 MG TABLET    Take 25 mg by mouth every 6 (six) hours as needed for dizziness.   ORPHENADRINE (NORFLEX) 100 MG TABLET    Take 1 tablet (100 mg total) by mouth 2 (two) times daily.   OXYCODONE (ROXICODONE) 15 MG IMMEDIATE RELEASE TABLET    Take 1 tablet (15 mg total) by mouth every 4 (four) hours as needed for moderate pain.   PHENYLEPHRINE-SHARK LIVER OIL-MINERAL OIL-PETROLATUM (PREPARATION H) 0.25-14-74.9 % RECTAL OINTMENT    Place 1 application rectally 2 (two) times daily.   TAMSULOSIN (FLOMAX) 0.4 MG  CAPS CAPSULE    Take 1 capsule (0.4 mg total) by mouth daily.  Modified Medications   No medications on file  Discontinued Medications   METHYLPREDNISOLONE (MEDROL DOSEPAK) 4 MG TBPK TABLET    Take 4 mg by mouth.     SIGNIFICANT DIAGNOSTIC EXAMS   PREVIOUS   01-24-17: lumbar x-ray: Images obtained during L3-4 fusion demonstrating bilateral L3 pedicle screws and an interbody fusion prosthesis appropriately positioned in the L3-4 disc space.   TODAY:  02-01-17: pelvic ultrasound: no post void residual    LABS REVIEWED: PREVIOUS   01-17-17: wbc 8.0; hgb 16.7; hct 48.8; mcv 92.1; plt 238; glucose 93; bun 9; creat 1.09; k+ 4.5; na++ 139; ca 9.8; liver normal albumin 4.2   NO NEW LABS    Review of Systems  Constitutional: Negative for malaise/fatigue.  Respiratory: Negative for cough and shortness of breath.   Cardiovascular: Negative for chest pain, palpitations and leg swelling.  Gastrointestinal: Negative for abdominal pain, constipation and heartburn.  Musculoskeletal: Positive for back pain. Negative for joint pain and myalgias.       Is being managed   Skin: Negative.   Neurological: Negative for dizziness.  Psychiatric/Behavioral: The patient is not nervous/anxious.     Physical Exam  Constitutional: He is oriented to person, place, and time. No distress.  Eyes: Conjunctivae are normal.  Neck: Neck supple. No JVD present. No thyromegaly present.  Cardiovascular: Normal rate, regular rhythm and intact distal pulses.   Respiratory: Effort normal and breath sounds normal. No respiratory distress. He has no wheezes.  GI: Soft. Bowel sounds are normal. He exhibits no distension. There is no tenderness.  Musculoskeletal: He exhibits no edema.  Able to move all extremities  Wears back brace Is ambulatory   Lymphadenopathy:    He has no cervical adenopathy.  Neurological: He is alert and oriented to person, place, and time.  Skin: Skin is warm and dry. He is not diaphoretic.    Psychiatric: He has a normal mood and affect.     ASSESSMENT/ PLAN:  TODAY  1. BPH with urinary obstruction: has a history of turp: is stable will continue flomax 0.4 mg daily   2. Substance induced mood disorder: is stable  will continue cymbalta 60 mg daily   3. Constipation:is stable  will continue colace twice daily   4. Lumbar stenosis with neurogenic claudication: is status post lumbar fusion: is doing well; will continue oxycodone 15 mg every 4  hours as needed neurontin 300 mg nightly and norflex 100 mg twice daily and will monitor  PREVIOUS  5. Vertigo: is stable will continue meclizine 25 mg every 6 hours as needed  6. External hemorrhoids: will continue preparation H twice daily    MD is aware of resident's narcotic use and is in agreement with current plan of care. We will attempt to wean resident as apropriate   Ok Edwards NP [facility]  Contact [phone] Monday through Friday 8am- 5pm  After hours call [phone]

## 2017-03-01 ENCOUNTER — Encounter: Payer: Self-pay | Admitting: Adult Health

## 2017-03-01 ENCOUNTER — Non-Acute Institutional Stay (SKILLED_NURSING_FACILITY): Payer: Medicaid Other | Admitting: Adult Health

## 2017-03-01 DIAGNOSIS — M4326 Fusion of spine, lumbar region: Secondary | ICD-10-CM | POA: Diagnosis not present

## 2017-03-01 DIAGNOSIS — M5440 Lumbago with sciatica, unspecified side: Secondary | ICD-10-CM

## 2017-03-01 DIAGNOSIS — M5136 Other intervertebral disc degeneration, lumbar region: Secondary | ICD-10-CM | POA: Diagnosis not present

## 2017-03-01 DIAGNOSIS — G8929 Other chronic pain: Secondary | ICD-10-CM

## 2017-03-01 NOTE — Progress Notes (Signed)
Location:   [place] Room Number: 127 A Place of Service:  SNF (31)    CODE STATUS: Full Code    Allergies  Allergen Reactions  . No Known Allergies     Chief Complaint  Patient presents with  . Discharge Note    Discharging to Home    HPI:  He had been hospitalized for lumbar back surgery. He was admitted to this facility for short term rehab from his surgery. He is now ready to return back home. He will need home health  for RN. He will not need dme he is independent with his adl's. He will need to follow up with his medical provider and will need his prescriptions written. He has no complaints and feels ready to go home.    Past Medical History:  Diagnosis Date  . Alcohol abuse   . Anxiety   . Arthritis   . BPH (benign prostatic hyperplasia)   . Chronic hepatitis C without hepatic coma ([place]) FOLLOWED BY INFECTOUS DISEASE DR COMER   POSITIVE ANTIBODY TEST THIS YEAR 2016--  CURRENT TX ON HARVONI PO  . Chronic low back pain   . Collapsed lung    right .Marland Kitchen..15 yrs ago, fell and hit some bricks  . DDD (degenerative disc disease), lumbosacral   . Depression    no meds now- med used for bladder  . Foley catheter in place   . GERD (gastroesophageal reflux disease)    hx none in long time  . History of cocaine abuse    per pt quit and last used 06-03-2013  . Urinary retention     Past Surgical History:  Procedure Laterality Date  . APPENDECTOMY  1980's  . BACK SURGERY    . CERVICAL DISC SURGERY    . CLOSED REDUCTION AND INTRAMAXILLARY FIXATION BILATERAL MENTAL FX'S/  PLACEMENT MAXILLARY STENT/ APERTURE WIRE PLACEMENT  01-04-2000  . JOINT REPLACEMENT    . POSTERIOR LUMBAR FUSION  03-05-2015   laminectomy /  nerve decompression--  L4 -- S1  . TOTAL KNEE ARTHROPLASTY Left 09/21/2013   Procedure: LEFT TOTAL KNEE ARTHROPLASTY;  Surgeon: Yvette Rack., MD;  Location: [place];  Service: Orthopedics;  Laterality: Left;  . TRANSURETHRAL RESECTION OF PROSTATE  N/A 06/16/2015   Procedure: TRANSURETHRAL RESECTION OF THE PROSTATE WITH GYRUS INSTRUMENTS;  Surgeon: Franchot Gallo, MD;  Location: [facility];  Service: Urology;  Laterality: N/A;    Social History   Social History  . Marital status: Divorced    Spouse name: N/A  . Number of children: N/A  . Years of education: N/A   Occupational History  . Not on file.   Social History Main Topics  . Smoking status: Never Smoker  . Smokeless tobacco: Never Used  . Alcohol use 3.6 oz/week    6 Cans of beer per week  . Drug use: No     Comment: per pt quit and last used crack 06-03-2013  . Sexual activity: Not on file   Other Topics Concern  . Not on file   Social History Narrative  . No narrative on file   History reviewed. No pertinent family history.  VITAL SIGNS BP 104/74   Pulse 84   Temp 98 F (36.7 C)   Resp 17   Ht 6\' 2"  (1.88 m)   Wt 187 lb 12.8 oz (85.2 kg)   SpO2 94%   BMI 24.11 kg/m   Patient's Medications  New Prescriptions   No  medications on file  Previous Medications   CIPROFLOXACIN (CIPRO) 500 MG TABLET    Take 500 mg by mouth 2 (two) times daily. X 7 days   DOCUSATE SODIUM (COLACE) 100 MG CAPSULE    Take 1 capsule (100 mg total) by mouth 2 (two) times daily.   DULOXETINE (CYMBALTA) 60 MG CAPSULE    Take 1 capsule (60 mg total) by mouth daily.   GABAPENTIN (NEURONTIN) 300 MG CAPSULE    Take 300 mg by mouth at bedtime.   MECLIZINE (ANTIVERT) 25 MG TABLET    Take 25 mg by mouth every 6 (six) hours as needed for dizziness.   ORPHENADRINE (NORFLEX) 100 MG TABLET    Take 1 tablet (100 mg total) by mouth 2 (two) times daily.   OXYCODONE (ROXICODONE) 15 MG IMMEDIATE RELEASE TABLET    Take 1 tablet (15 mg total) by mouth every 4 (four) hours as needed for moderate pain.   PHENYLEPHRINE-SHARK LIVER OIL-MINERAL OIL-PETROLATUM (PREPARATION H) 0.25-14-74.9 % RECTAL OINTMENT    Place 1 application rectally 2 (two) times daily.   TAMSULOSIN (FLOMAX) 0.4  MG CAPS CAPSULE    Take 1 capsule (0.4 mg total) by mouth daily.  Modified Medications   No medications on file  Discontinued Medications   No medications on file     SIGNIFICANT DIAGNOSTIC EXAMS  PREVIOUS   01-24-17: lumbar x-ray: Images obtained during L3-4 fusion demonstrating bilateral L3 pedicle screws and an interbody fusion prosthesis appropriately positioned in the L3-4 disc space.   02-01-17: pelvic ultrasound: no post void residual    NO NEW EXAMS  LABS REVIEWED: PREVIOUS   01-17-17: wbc 8.0; hgb 16.7; hct 48.8; mcv 92.1; plt 238; glucose 93; bun 9; creat 1.09; k+ 4.5; na++ 139; ca 9.8; liver normal albumin 4.2   NO NEW LABS   Review of Systems  Constitutional: Negative for malaise/fatigue.  Respiratory: Negative for cough and shortness of breath.   Cardiovascular: Negative for chest pain, palpitations and leg swelling.  Gastrointestinal: Negative for abdominal pain, constipation and heartburn.  Musculoskeletal: Negative for back pain, joint pain and myalgias.  Skin: Negative.   Neurological: Negative for dizziness.  Psychiatric/Behavioral: The patient is not nervous/anxious.     Physical Exam  Constitutional: He is oriented to person, place, and time. He appears well-developed and well-nourished. No distress.  Eyes: Conjunctivae are normal.  Neck: Neck supple. No JVD present. No thyromegaly present.  Cardiovascular: Normal rate, regular rhythm and intact distal pulses.   Respiratory: Effort normal and breath sounds normal. No respiratory distress. He has no wheezes.  GI: Soft. Bowel sounds are normal. He exhibits no distension. There is no tenderness.  Musculoskeletal: He exhibits no edema.  Able to move all extremities  Is ambulatory   Lymphadenopathy:    He has no cervical adenopathy.  Neurological: He is alert and oriented to person, place, and time.  Skin: Skin is warm and dry. He is not diaphoretic.  Psychiatric: He has a normal mood and affect.       ASSESSMENT/PLAN  Patient is being discharged with the following home health services:  RN to evaluate and treat as indicated for medication management   Patient is being discharged with the following durable medical equipment:  None required he is independent with his adls.   Patient has been advised to f/u with their PCP in 1-2 weeks to bring them up to date on their rehab stay.  Social services at facility was responsible for arranging this appointment.  Pt was provided with a 30 day supply of prescriptions for medications and refills must be obtained from their PCP.  For controlled substances, a more limited supply may be provided adequate until PCP appointment only.  #30 oxycodone 15 mg tabs    Time spent with the discharge process: 40 minutes    Ok Edwards NP [facility]  Contact [phone] Monday through Friday 8am- 5pm  After hours call [phone]

## 2017-05-02 DIAGNOSIS — G510 Bell's palsy: Secondary | ICD-10-CM

## 2017-05-02 HISTORY — DX: Bell's palsy: G51.0

## 2017-05-16 ENCOUNTER — Emergency Department (HOSPITAL_COMMUNITY)
Admission: EM | Admit: 2017-05-16 | Discharge: 2017-05-16 | Disposition: A | Discharge status: Home / Self Care | Payer: Medicaid Other | Attending: Emergency Medicine | Admitting: Emergency Medicine

## 2017-05-16 ENCOUNTER — Encounter (HOSPITAL_COMMUNITY): Payer: Self-pay | Admitting: Emergency Medicine

## 2017-05-16 DIAGNOSIS — Z79899 Other long term (current) drug therapy: Secondary | ICD-10-CM | POA: Diagnosis not present

## 2017-05-16 DIAGNOSIS — Z96652 Presence of left artificial knee joint: Secondary | ICD-10-CM | POA: Insufficient documentation

## 2017-05-16 DIAGNOSIS — G510 Bell's palsy: Secondary | ICD-10-CM | POA: Insufficient documentation

## 2017-05-16 DIAGNOSIS — R29810 Facial weakness: Secondary | ICD-10-CM | POA: Diagnosis present

## 2017-05-16 MED ORDER — POLYVINYL ALCOHOL 1.4 % OP SOLN
1.0000 [drp] | Freq: Once | OPHTHALMIC | Status: AC
  Administered 2017-05-16: 1 [drp] via OPHTHALMIC
  Filled 2017-05-16: qty 15

## 2017-05-16 MED ORDER — ARTIFICIAL TEARS OPHTHALMIC OINT
TOPICAL_OINTMENT | Freq: Once | OPHTHALMIC | Status: DC
  Filled 2017-05-16: qty 3.5

## 2017-05-16 MED ORDER — ARTIFICIAL TEARS OPHTHALMIC OINT: TOPICAL_OINTMENT | OPHTHALMIC | 0 refills | Status: DC | PRN

## 2017-05-16 MED ORDER — PREDNISONE 20 MG PO TABS
60.0000 mg | ORAL_TABLET | Freq: Once | ORAL | Status: AC
  Administered 2017-05-16: 60 mg via ORAL
  Filled 2017-05-16: qty 3

## 2017-05-16 MED ORDER — PREDNISONE 20 MG PO TABS: 40.0000 mg | ORAL_TABLET | Freq: Every day | ORAL | 0 refills | Status: DC

## 2017-05-16 NOTE — ED Notes (Signed)
Pt verbalized understanding of d/c instructions and has no further questions. VSS, NAD. Pt provided with eye drops, and tape to go home with.

## 2017-05-16 NOTE — ED Triage Notes (Signed)
Pt arrives to ED with eye injury that happen 3 days ago when something went into his right eye. Pt states yesterday the right side of his face started drooping. MD Kohut asked to come to triage to have a look at pt for further prders.

## 2017-05-16 NOTE — Discharge Instructions (Signed)
You have Bell's palsy. Essentially the muscles on the R side of your face are paralyzed. It may take weeks to months to improve but sometimes it does not improve completely.   You're eye is very irritated because the muscles to close it are not working well. It is important to keep it lubricated. You can use eye drops, but an ointment like the one being prescribed may be more beneficial. You may gently tape your eye closed with a light weight tape when sleeping. Do not do this during the day.

## 2017-05-18 NOTE — ED Provider Notes (Signed)
[facility]ERG[facility] DEPARTMENT Provider Note   CSN: [id number] Arrival date & time: 05/16/17  1856     History   Chief Complaint Chief Complaint  Patient presents with  . Facial Droop  . Eye Injury    HPI [name] is a 66 y.o. male.  HPI   66 year old male with right facial droop and right eye pain. Onset 3 days ago. Persistent since then. Patient is a constant sensation that there is ligament side. Some tearing. R face drooping to point of affecting speech. Some food dribbling from mouth when he eats. No numbness,tingling or loss of strength otherwise.   Past Medical History:  Diagnosis Date  . Alcohol abuse   . Anxiety   . Arthritis   . BPH (benign prostatic hyperplasia)   . Chronic hepatitis C without hepatic coma ([place]) FOLLOWED BY INFECTOUS DISEASE DR COMER   POSITIVE ANTIBODY TEST THIS YEAR 2016--  CURRENT TX ON HARVONI PO  . Chronic low back pain   . Collapsed lung    right .Marland Kitchen..15 yrs ago, fell and hit some bricks  . DDD (degenerative disc disease), lumbosacral   . Depression    no meds now- med used for bladder  . Foley catheter in place   . GERD (gastroesophageal reflux disease)    hx none in long time  . History of cocaine abuse    per pt quit and last used 06-03-2013  . Urinary retention     Patient Active Problem List   Diagnosis Date Noted  . Fusion of lumbar spine 02/07/2017  . Constipation due to opioid therapy 01/27/2017  . Lumbar stenosis with neurogenic claudication 01/24/2017  . Enlarged prostate with urinary obstruction 06/16/2015  . Lumbar degenerative disc disease 03/05/2015  . Chronic hepatitis C without hepatic coma ([place]) 12/09/2014  . Chronic low back pain 01/17/2014  . Osteoarthritis of left knee 09/21/2013  . Alcohol dependence ([place]) 06/12/2013  . Cocaine dependence ([place]) 06/12/2013  . Substance induced mood disorder ([place]) 06/12/2013    Past Surgical History:  Procedure Laterality Date  . APPENDECTOMY   1980's  . BACK SURGERY    . CERVICAL DISC SURGERY    . CLOSED REDUCTION AND INTRAMAXILLARY FIXATION BILATERAL MENTAL FX'S/  PLACEMENT MAXILLARY STENT/ APERTURE WIRE PLACEMENT  01-04-2000  . JOINT REPLACEMENT    . POSTERIOR LUMBAR FUSION  03-05-2015   laminectomy /  nerve decompression--  L4 -- S1  . TOTAL KNEE ARTHROPLASTY Left 09/21/2013   Procedure: LEFT TOTAL KNEE ARTHROPLASTY;  Surgeon: Yvette Rack., MD;  Location: [facility];  Service: Orthopedics;  Laterality: Left;  . TRANSURETHRAL RESECTION OF PROSTATE N/A 06/16/2015   Procedure: TRANSURETHRAL RESECTION OF THE PROSTATE WITH GYRUS INSTRUMENTS;  Surgeon: Franchot Gallo, MD;  Location: [facility];  Service: Urology;  Laterality: N/A;       Home Medications    Prior to Admission medications   Medication Sig Start Date End Date Taking? Authorizing Provider  artificial tears (LACRILUBE) OINT ophthalmic ointment Place into both eyes every 3 (three) hours as needed for dry eyes. 05/16/17   Virgel Manifold, MD  docusate sodium (COLACE) 100 MG capsule Take 1 capsule (100 mg total) by mouth 2 (two) times daily. 01/26/17   Newman Pies, MD  DULoxetine (CYMBALTA) 60 MG capsule Take 1 capsule (60 mg total) by mouth daily. 01/17/14   Marcial Pacas, MD  gabapentin (NEURONTIN) 300 MG capsule Take 300 mg by mouth at bedtime.  [provider]  meclizine (ANTIVERT) 25 MG tablet Take 25 mg by mouth every 6 (six) hours as needed for dizziness.    [provider]  orphenadrine (NORFLEX) 100 MG tablet Take 1 tablet (100 mg total) by mouth 2 (two) times daily. 11/03/30   Delora Fuel, MD  oxyCODONE (ROXICODONE) 15 MG immediate release tablet Take 1 tablet (15 mg total) by mouth every 4 (four) hours as needed for moderate pain. 01/26/17   Newman Pies, MD  phenylephrine-shark liver oil-mineral oil-petrolatum (PREPARATION H) 0.25-14-74.9 % rectal ointment Place 1 application rectally 2 (two) times daily.    [provider]  predniSONE (DELTASONE) 20 MG tablet Take 2 tablets (40 mg total) by mouth daily. 05/16/17   Virgel Manifold, MD  tamsulosin (FLOMAX) 0.4 MG CAPS capsule Take 1 capsule (0.4 mg total) by mouth daily. 02/01/17   Duffy Bruce, MD    Family History No family history on file.  Social History Social History  Substance Use Topics  . Smoking status: Never Smoker  . Smokeless tobacco: Never Used  . Alcohol use 3.6 oz/week    6 Cans of beer per week     Allergies   No known allergies   Review of Systems Review of Systems  All systems reviewed and negative, other than as noted in HPI.  Physical Exam Updated Vital Signs BP (!) 146/86   Pulse 84   Temp 97.9 F (36.6 C) (Oral)   Resp 18   Ht 6\' 2"  (1.88 m)   Wt 83.9 kg (185 lb)   SpO2 100%   BMI 23.75 kg/m   Physical Exam  Constitutional: He appears well-developed and well-nourished. No distress.  HENT:  Head: Normocephalic and atraumatic.  Eyes: Conjunctivae are normal. Right eye exhibits no discharge. Left eye exhibits no discharge.  Left eye with conjunctival injection.  Neck: Neck supple.  Cardiovascular: Normal rate, regular rhythm and normal heart sounds.  Exam reveals no gallop and no friction rub.   No murmur heard. Pulmonary/Chest: Effort normal and breath sounds normal. No respiratory distress.  Abdominal: Soft. He exhibits no distension. There is no tenderness.  Musculoskeletal: He exhibits no edema or tenderness.  Neurological: He is alert.  Very dense right facial nerve palsy. Right forehead clearly does not wrinkle. Cranial nerves II through XII are otherwise intact. Strength is 5 out of 5 bilateral upper lower extremities.  Skin: Skin is warm and dry.  Psychiatric: He has a normal mood and affect. His behavior is normal. Thought content normal.  Nursing note and vitals reviewed.    ED Treatments / Results  Labs (all labs ordered are listed, but only abnormal results are displayed) Labs  Reviewed - No data to display  EKG  EKG Interpretation None       Radiology No results found.  Procedures Procedures (including critical care time)  Medications Ordered in ED Medications  predniSONE (DELTASONE) tablet 60 mg (60 mg Oral Given 05/16/17 2048)  polyvinyl alcohol (LIQUIFILM TEARS) 1.4 % ophthalmic solution 1 drop (1 drop Right Eye Given 05/16/17 2048)     Initial Impression / Assessment and Plan / ED Course  I have reviewed the triage vital signs and the nursing notes.  Pertinent labs & imaging results that were available during my care of the patient were reviewed by me and considered in my medical decision making (see chart for details).     66 year old male with right facial weakness and right eye pain. Clinically has a Bell's palsy.  I pain is most likely secondary to irritation due to inability to fully close his side. Advised to use lubricating drops or ointment. He was provided with likely tape shown how to tape his eye shut at night. He'll be started on steroids. Outpatient follow-up.    Final Clinical Impressions(s) / ED Diagnoses   Final diagnoses:  Bell's palsy    New Prescriptions Discharge Medication List as of 05/16/2017  7:43 PM    START taking these medications   Details  artificial tears (LACRILUBE) OINT ophthalmic ointment Place into both eyes every 3 (three) hours as needed for dry eyes., Starting Mon 05/16/2017, Print    predniSONE (DELTASONE) 20 MG tablet Take 2 tablets (40 mg total) by mouth daily., Starting Mon 05/16/2017, Print         Virgel Manifold, MD 05/18/17 [phone]

## 2017-09-05 ENCOUNTER — Encounter (HOSPITAL_COMMUNITY): Payer: Self-pay

## 2017-09-05 ENCOUNTER — Other Ambulatory Visit: Payer: Self-pay

## 2017-09-05 ENCOUNTER — Emergency Department (HOSPITAL_COMMUNITY): Payer: Medicaid Other

## 2017-09-05 ENCOUNTER — Emergency Department (HOSPITAL_COMMUNITY)
Admission: EM | Admit: 2017-09-05 | Discharge: 2017-09-05 | Disposition: A | Discharge status: Home / Self Care | Payer: Medicaid Other | Attending: Emergency Medicine | Admitting: Emergency Medicine

## 2017-09-05 DIAGNOSIS — Y9389 Activity, other specified: Secondary | ICD-10-CM | POA: Diagnosis not present

## 2017-09-05 DIAGNOSIS — Z96652 Presence of left artificial knee joint: Secondary | ICD-10-CM | POA: Diagnosis not present

## 2017-09-05 DIAGNOSIS — Z79899 Other long term (current) drug therapy: Secondary | ICD-10-CM | POA: Diagnosis not present

## 2017-09-05 DIAGNOSIS — W010XXA Fall on same level from slipping, tripping and stumbling without subsequent striking against object, initial encounter: Secondary | ICD-10-CM | POA: Insufficient documentation

## 2017-09-05 DIAGNOSIS — S299XXA Unspecified injury of thorax, initial encounter: Secondary | ICD-10-CM | POA: Diagnosis present

## 2017-09-05 DIAGNOSIS — Y999 Unspecified external cause status: Secondary | ICD-10-CM | POA: Diagnosis not present

## 2017-09-05 DIAGNOSIS — Y929 Unspecified place or not applicable: Secondary | ICD-10-CM | POA: Diagnosis not present

## 2017-09-05 DIAGNOSIS — S2232XA Fracture of one rib, left side, initial encounter for closed fracture: Secondary | ICD-10-CM | POA: Diagnosis not present

## 2017-09-05 MED ORDER — HYDROCODONE-ACETAMINOPHEN 5-325 MG PO TABS: 1.0000 | ORAL_TABLET | Freq: Four times a day (QID) | ORAL | 0 refills | Status: DC | PRN

## 2017-09-05 MED ORDER — HYDROCODONE-ACETAMINOPHEN 5-325 MG PO TABS
1.0000 | ORAL_TABLET | Freq: Once | ORAL | Status: AC
  Administered 2017-09-05: 1 via ORAL
  Filled 2017-09-05: qty 1

## 2017-09-05 MED ORDER — ACETAMINOPHEN 325 MG PO TABS: 650.0000 mg | ORAL_TABLET | Freq: Once | ORAL | Status: DC

## 2017-09-05 NOTE — ED Triage Notes (Signed)
Per Pt, Pt is coming from home with complaints of fall on Saturday night down some stairs. Pt reports that he is having left rib pain where he hit it coming down. Hit his head, but denies and LOC. Alert and Oriented x4. Ambulatory on Arrival.

## 2017-09-05 NOTE — ED Provider Notes (Signed)
Patient placed in Quick Look pathway, seen and evaluated   Chief Complaint: Left-sided rib pain from fall.  HPI:   67 year old male with no pertinent past medical history presents to the ED with complaints of left rib pain after mechanical fall 2 days ago.  The patient states that he tripped down the stairs and landed on his left rib.  Does report hitting his head but denies any LOC.  Patient reports pain to his left ribs that is worse with palpation and inspiration.  He has not been taking medications at home for his pain.  Nothing makes better.   Pt denies any fever, chill, ha, vision changes, lightheadedness, dizziness, congestion, neck pain, cp, sob, cough, abd pain, n/v/d, urinary symptoms, change in bowel habits, melena, hematochezia, lower extremity paresthesias.  Patient is not on blood thinners.   ROS: Reports associated left rib pain and some shortness of breath.  Patient denies any associated headache, vision changes, neck pain, abdominal pain, back pain, vision changes, syncope.  (one)  Physical Exam:   Gen: No distress  Neuro: Awake and Alert  Skin: Warm    Focused Exam: no skull depression, no hematoma. No septal hematoma, no bilateral hemotypanum. No raccon eyes, no battle signs. No c spine tenderness. Pain with palpation of the left lateral rib cage without any deformity, step-offs, ecchymosis, edema noted.  Lungs clear to auscultation bilaterally.  Decreased breath sounds due to the pain.  Heart regular rate and rhythm with no rubs murmurs or gallops.  Abdominal exam is benign without any focal tenderness.  No midline t spine or l spine tenderness.   Initiation of care has begun. The patient has been counseled on the process, plan, and necessity for staying for the completion/evaluation, and the remainder of the medical screening examination. Workup includes xray of chest and rib.    Doristine Devoid, PA-C 09/05/17 1551    Virgel Manifold, MD 09/06/17 [phone]

## 2017-09-05 NOTE — Discharge Instructions (Signed)
Please read attached information. If you experience any new or worsening signs or symptoms please return to the emergency room for evaluation. Please follow-up with your primary care provider or specialist as discussed. Please use medication prescribed only as directed and discontinue taking if you have any concerning signs or symptoms.   °

## 2017-09-05 NOTE — ED Notes (Signed)
Declined W/C at D/C and was escorted to lobby by RN. 

## 2017-09-05 NOTE — ED Provider Notes (Signed)
[facility] EMERGENCY DEPARTMENT Provider Note   CSN: [id number] Arrival date & time: 09/05/17  1328     History   Chief Complaint Chief Complaint  Patient presents with  . Fall    HPI [name] is a 67 y.o. male.  HPI   67 year old male presents status post fall.  Patient notes that 2 days ago he was walking down stairs when he tripped after his right knee gave out on him.  He landed on the left side causing immediate pain to the left lateral ribs.  Patient denies any loss of consciousness, denies any neck or head pain.  He reports pain only in the left lateral ribs worse with movement worse with palpation.  Patient notes a very minimal cough, denies any fever or significant shortness of breath.  Patient has not tried any medication at home for the pain.  He denies any neurological deficits or any other musculoskeletal pain.  He is not taking any anticoagulation.  Patient denies any recent alcohol use reporting he has not had a beer in over a year.   Past Medical History:  Diagnosis Date  . Alcohol abuse   . Anxiety   . Arthritis   . BPH (benign prostatic hyperplasia)   . Chronic hepatitis C without hepatic coma ([place]) FOLLOWED BY INFECTOUS DISEASE DR COMER   POSITIVE ANTIBODY TEST THIS YEAR 2016--  CURRENT TX ON HARVONI PO  . Chronic low back pain   . Collapsed lung    right .Marland Kitchen..15 yrs ago, fell and hit some bricks  . DDD (degenerative disc disease), lumbosacral   . Depression    no meds now- med used for bladder  . Foley catheter in place   . GERD (gastroesophageal reflux disease)    hx none in long time  . History of cocaine abuse    per pt quit and last used 06-03-2013  . Urinary retention     Patient Active Problem List   Diagnosis Date Noted  . Fusion of lumbar spine 02/07/2017  . Constipation due to opioid therapy 01/27/2017  . Lumbar stenosis with neurogenic claudication 01/24/2017  . Enlarged prostate with urinary obstruction 06/16/2015   . Lumbar degenerative disc disease 03/05/2015  . Chronic hepatitis C without hepatic coma ([place]) 12/09/2014  . Chronic low back pain 01/17/2014  . Osteoarthritis of left knee 09/21/2013  . Alcohol dependence ([place]) 06/12/2013  . Cocaine dependence ([place]) 06/12/2013  . Substance induced mood disorder ([place]) 06/12/2013    Past Surgical History:  Procedure Laterality Date  . APPENDECTOMY  1980's  . BACK SURGERY    . CERVICAL DISC SURGERY    . CLOSED REDUCTION AND INTRAMAXILLARY FIXATION BILATERAL MENTAL FX'S/  PLACEMENT MAXILLARY STENT/ APERTURE WIRE PLACEMENT  01-04-2000  . JOINT REPLACEMENT    . POSTERIOR LUMBAR FUSION  03-05-2015   laminectomy /  nerve decompression--  L4 -- S1  . TOTAL KNEE ARTHROPLASTY Left 09/21/2013   Procedure: LEFT TOTAL KNEE ARTHROPLASTY;  Surgeon: Yvette Rack., MD;  Location: [facility];  Service: Orthopedics;  Laterality: Left;  . TRANSURETHRAL RESECTION OF PROSTATE N/A 06/16/2015   Procedure: TRANSURETHRAL RESECTION OF THE PROSTATE WITH GYRUS INSTRUMENTS;  Surgeon: Franchot Gallo, MD;  Location[facility];  Service: Urology;  Laterality: N/A;       Home Medications    Prior to Admission medications   Medication Sig Start Date End Date Taking? Authorizing Provider  artificial tears (LACRILUBE) OINT ophthalmic ointment Place into  both eyes every 3 (three) hours as needed for dry eyes. 05/16/17   Virgel Manifold, MD  docusate sodium (COLACE) 100 MG capsule Take 1 capsule (100 mg total) by mouth 2 (two) times daily. 01/26/17   Newman Pies, MD  DULoxetine (CYMBALTA) 60 MG capsule Take 1 capsule (60 mg total) by mouth daily. 01/17/14   Marcial Pacas, MD  gabapentin (NEURONTIN) 300 MG capsule Take 300 mg by mouth at bedtime.    [provider]  HYDROcodone-acetaminophen (NORCO/VICODIN) 5-325 MG tablet Take 1 tablet by mouth every 6 (six) hours as needed. 09/05/17   Kimba Lottes, Dellis Filbert, PA-C  meclizine (ANTIVERT) 25 MG tablet Take 25 mg by mouth  every 6 (six) hours as needed for dizziness.    [provider]  orphenadrine (NORFLEX) 100 MG tablet Take 1 tablet (100 mg total) by mouth 2 (two) times daily. 08/09/82   Delora Fuel, MD  oxyCODONE (ROXICODONE) 15 MG immediate release tablet Take 1 tablet (15 mg total) by mouth every 4 (four) hours as needed for moderate pain. 01/26/17   Newman Pies, MD  phenylephrine-shark liver oil-mineral oil-petrolatum (PREPARATION H) 0.25-14-74.9 % rectal ointment Place 1 application rectally 2 (two) times daily.    [provider]  predniSONE (DELTASONE) 20 MG tablet Take 2 tablets (40 mg total) by mouth daily. 05/16/17   Virgel Manifold, MD  tamsulosin (FLOMAX) 0.4 MG CAPS capsule Take 1 capsule (0.4 mg total) by mouth daily. 02/01/17   Duffy Bruce, MD    Family History No family history on file.  Social History Social History   Tobacco Use  . Smoking status: Never Smoker  . Smokeless tobacco: Never Used  Substance Use Topics  . Alcohol use: Yes    Alcohol/week: 3.6 oz    Types: 6 Cans of beer per week  . Drug use: No    Comment: per pt quit and last used crack 06-03-2013     Allergies   No known allergies   Review of Systems Review of Systems  All other systems reviewed and are negative.  Physical Exam Updated Vital Signs BP 138/84 (BP Location: Right Arm)   Pulse 85   Temp 97.7 F (36.5 C) (Oral)   Resp 16   Ht 6\' 1"  (1.854 m)   Wt 86.2 kg (190 lb)   SpO2 100%   BMI 25.07 kg/m   Physical Exam  Constitutional: He is oriented to person, place, and time. He appears well-developed and well-nourished.  HENT:  Head: Normocephalic and atraumatic.  Eyes: Conjunctivae are normal. Pupils are equal, round, and reactive to light. Right eye exhibits no discharge. Left eye exhibits no discharge. No scleral icterus.  Neck: Normal range of motion. No JVD present. No tracheal deviation present.  Cardiovascular: Normal rate, regular rhythm, normal heart sounds and  intact distal pulses.  Pulmonary/Chest: Effort normal. No stridor. No respiratory distress. He has no wheezes. He has no rales. He exhibits tenderness.  Tenderness palpation of left lateral ribs, no crepitus, lung sounds clear throughout no adventitious lung  Neurological: He is alert and oriented to person, place, and time. Coordination normal.  Psychiatric: He has a normal mood and affect. His behavior is normal. Judgment and thought content normal.  Nursing note and vitals reviewed.    ED Treatments / Results  Labs (all labs ordered are listed, but only abnormal results are displayed) Labs Reviewed - No data to display  EKG  EKG Interpretation None       Radiology Dg Ribs Unilateral  W/chest Left  Result Date: 09/05/2017 CLINICAL DATA:  Left-sided chest pain after falling 2 days ago. Tripped down the stairs. EXAM: LEFT RIBS AND CHEST - 3+ VIEW COMPARISON:  04/13/2013 FINDINGS: Heart and mediastinal shadows are normal. The lungs are clear. No pneumothorax or hemothorax. Left rib films show nondisplaced fracture of the posterior tenth rib. IMPRESSION: Nondisplaced fracture of the posterior/posterolateral left tenth rib. No pneumothorax or hemothorax. Electronically Signed   By: Nelson Chimes M.D.   On: 09/05/2017 16:05    Procedures Procedures (including critical care time)  Medications Ordered in ED Medications  HYDROcodone-acetaminophen (NORCO/VICODIN) 5-325 MG per tablet 1 tablet (1 tablet Oral Given 09/05/17 1754)     Initial Impression / Assessment and Plan / ED Course  I have reviewed the triage vital signs and the nursing notes.  Pertinent labs & imaging results that were available during my care of the patient were reviewed by me and considered in my medical decision making (see chart for details).     Final Clinical Impressions(s) / ED Diagnoses   Final diagnoses:  Closed fracture of one rib of left side, initial encounter    Labs:   Imaging: DG ribs unilateral  chest left-nondisplaced fracture of the proximal posterior lateral left 10th rib  Consults:  Therapeutics: norco  Discharge Meds: Norco  Assessment/Plan: 67 year old male presents today status post fall.  This was 2 days ago, he has what appears to be a acute fracture, no respiratory distress, no signs of pneumothorax on physical or imaging studies.  Patient without any other acute injury status post fall.  He has been given a short course of pain medication.  Chart review shows history of alcohol abuse, patient reports he has not had a drink in 2 months.  Due to the acutely painful nature of his injury he will be given pain medication, counseled on not drinking alcohol or using drugs.  Patient encouraged follow-up with his primary care for reassessment this week, return to the emergency room if he develops any new or worsening signs or symptoms.  Patient verbalized understanding and agreement to today's plan had no further questions or concerns at the time of discharge.      ED Discharge Orders        Ordered    HYDROcodone-acetaminophen (NORCO/VICODIN) 5-325 MG tablet  Every 6 hours PRN     09/05/17 1806       Okey Regal, Hershal Coria 09/05/17 1806    Mabe, Forbes Cellar, MD 09/05/17 [phone]

## 2018-02-21 ENCOUNTER — Other Ambulatory Visit: Payer: Self-pay

## 2018-02-21 ENCOUNTER — Encounter (HOSPITAL_COMMUNITY): Payer: Self-pay | Admitting: *Deleted

## 2018-02-21 ENCOUNTER — Emergency Department (HOSPITAL_COMMUNITY)
Admission: EM | Admit: 2018-02-21 | Discharge: 2018-02-21 | Disposition: A | Discharge status: Home / Self Care | Payer: Medicare Other | Attending: Emergency Medicine | Admitting: Emergency Medicine

## 2018-02-21 ENCOUNTER — Emergency Department (HOSPITAL_COMMUNITY): Payer: Medicare Other

## 2018-02-21 DIAGNOSIS — H9311 Tinnitus, right ear: Secondary | ICD-10-CM | POA: Insufficient documentation

## 2018-02-21 DIAGNOSIS — Z79899 Other long term (current) drug therapy: Secondary | ICD-10-CM | POA: Diagnosis not present

## 2018-02-21 DIAGNOSIS — R590 Localized enlarged lymph nodes: Secondary | ICD-10-CM

## 2018-02-21 DIAGNOSIS — Z7984 Long term (current) use of oral hypoglycemic drugs: Secondary | ICD-10-CM | POA: Diagnosis not present

## 2018-02-21 DIAGNOSIS — R600 Localized edema: Secondary | ICD-10-CM | POA: Diagnosis present

## 2018-02-21 DIAGNOSIS — Z96652 Presence of left artificial knee joint: Secondary | ICD-10-CM | POA: Insufficient documentation

## 2018-02-21 LAB — CBC WITH DIFFERENTIAL/PLATELET
Abs Immature Granulocytes: 0 10*3/uL (ref 0.0–0.1)
Basophils Absolute: 0 10*3/uL (ref 0.0–0.1)
Basophils Relative: 0 %
Eosinophils Absolute: 0.4 10*3/uL (ref 0.0–0.7)
Eosinophils Relative: 5 %
HCT: 46 % (ref 39.0–52.0)
Hemoglobin: 14.5 g/dL (ref 13.0–17.0)
Immature Granulocytes: 0 %
Lymphocytes Relative: 48 %
Lymphs Abs: 3.3 10*3/uL (ref 0.7–4.0)
MCH: 29.1 pg (ref 26.0–34.0)
MCHC: 31.5 g/dL (ref 30.0–36.0)
MCV: 92.2 fL (ref 78.0–100.0)
Monocytes Absolute: 0.6 10*3/uL (ref 0.1–1.0)
Monocytes Relative: 8 %
Neutro Abs: 2.8 10*3/uL (ref 1.7–7.7)
Neutrophils Relative %: 39 %
Platelets: 272 10*3/uL (ref 150–400)
RBC: 4.99 MIL/uL (ref 4.22–5.81)
RDW: 13 % (ref 11.5–15.5)
WBC: 7.1 10*3/uL (ref 4.0–10.5)

## 2018-02-21 LAB — COMPREHENSIVE METABOLIC PANEL
ALT: 19 U/L (ref 0–44)
AST: 21 U/L (ref 15–41)
Albumin: 3.7 g/dL (ref 3.5–5.0)
Alkaline Phosphatase: 71 U/L (ref 38–126)
Anion gap: 9 (ref 5–15)
BUN: 8 mg/dL (ref 8–23)
CO2: 26 mmol/L (ref 22–32)
Calcium: 9.4 mg/dL (ref 8.9–10.3)
Chloride: 107 mmol/L (ref 98–111)
Creatinine, Ser: 1.01 mg/dL (ref 0.61–1.24)
GFR calc Af Amer: >60 mL/min (ref >60)
GFR calc non Af Amer: >60 mL/min (ref >60)
Glucose, Bld: 102 mg/dL — ABNORMAL HIGH (ref 70–99)
Potassium: 4 mmol/L (ref 3.5–5.1)
Sodium: 142 mmol/L (ref 135–145)
Total Bilirubin: 0.4 mg/dL (ref 0.3–1.2)
Total Protein: 7 g/dL (ref 6.5–8.1)

## 2018-02-21 MED ORDER — ACETAMINOPHEN 500 MG PO TABS
1000.0000 mg | ORAL_TABLET | Freq: Once | ORAL | Status: AC
  Administered 2018-02-21: 1000 mg via ORAL
  Filled 2018-02-21: qty 2

## 2018-02-21 MED ORDER — IOHEXOL 300 MG/ML  SOLN
75.0000 mL | Freq: Once | INTRAMUSCULAR | Status: AC | PRN
  Administered 2018-02-21: 75 mL via INTRAVENOUS

## 2018-02-21 NOTE — ED Triage Notes (Signed)
Pt arrived by [facility], reports right swollen gland for several weeks. Has been on amoxicillin x 2 with no relief. Now has increase in pain, difficulty swallowing and reports pain to right ear and eye. Airway intact at triage.

## 2018-02-21 NOTE — ED Notes (Signed)
ED Provider at bedside. 

## 2018-02-21 NOTE — Discharge Instructions (Addendum)
Contact a health care provider if: Your lymph nodes are still swollen after 2 weeks. Your swelling increases or spreads to other areas. Your lymph nodes are hard, seem fixed to the skin, or are growing rapidly. Your skin over the lymph nodes is red and inflamed. You have a fever. You have chills. You have fatigue. You develop a sore throat. You have abdominal pain. You have weight loss. You have night sweats. Get help right away if: You notice fluid leaking from the area of the enlarged lymph node. You have severe pain in any area of your body. You have chest pain. You have shortness of breath.

## 2018-02-21 NOTE — ED Notes (Signed)
Pt verbalizes understanding of d/c instructions. Pt ambulatory at d/c with all belongings.   

## 2018-02-21 NOTE — ED Provider Notes (Signed)
[facility] EMERGENCY DEPARTMENT Provider Note   CSN: [id number] Arrival date & time: 02/21/18  1800     History   Chief Complaint Chief Complaint  Patient presents with  . Facial Swelling    HPI [name] is a 67 y.o. male presents emergency department with chief complaint of swelling to the right side of his neck.  She states that it began as a marble sized swelling about 3 weeks ago.  He saw his primary care physician and completed a course of amoxicillin followed by a course of Augmentin.  He states that now it has gotten extremely large and painful.  He saw his PCP who took lab work today.  He complains of pain with swallowing and turning of the neck.  He also has ringing in the right ear.  He denies cough, hemoptysis, unexplained weight loss, soaking night sweats.  He denies a history of tobacco abuse or smoking.  He does have a history of cocaine and alcohol abuse.  HPI  Past Medical History:  Diagnosis Date  . Alcohol abuse   . Anxiety   . Arthritis   . BPH (benign prostatic hyperplasia)   . Chronic hepatitis C without hepatic coma ([place]) FOLLOWED BY INFECTOUS DISEASE DR COMER   POSITIVE ANTIBODY TEST THIS YEAR 2016--  CURRENT TX ON HARVONI PO  . Chronic low back pain   . Collapsed lung    right .Marland Kitchen..15 yrs ago, fell and hit some bricks  . DDD (degenerative disc disease), lumbosacral   . Depression    no meds now- med used for bladder  . Foley catheter in place   . GERD (gastroesophageal reflux disease)    hx none in long time  . History of cocaine abuse    per pt quit and last used 06-03-2013  . Urinary retention     Patient Active Problem List   Diagnosis Date Noted  . Fusion of lumbar spine 02/07/2017  . Constipation due to opioid therapy 01/27/2017  . Lumbar stenosis with neurogenic claudication 01/24/2017  . Enlarged prostate with urinary obstruction 06/16/2015  . Lumbar degenerative disc disease 03/05/2015  . Chronic hepatitis C  without hepatic coma ([place]) 12/09/2014  . Chronic low back pain 01/17/2014  . Osteoarthritis of left knee 09/21/2013  . Alcohol dependence ([place]) 06/12/2013  . Cocaine dependence ([place]) 06/12/2013  . Substance induced mood disorder ([place]) 06/12/2013    Past Surgical History:  Procedure Laterality Date  . APPENDECTOMY  1980's  . BACK SURGERY    . CERVICAL DISC SURGERY    . CLOSED REDUCTION AND INTRAMAXILLARY FIXATION BILATERAL MENTAL FX'S/  PLACEMENT MAXILLARY STENT/ APERTURE WIRE PLACEMENT  01-04-2000  . JOINT REPLACEMENT    . POSTERIOR LUMBAR FUSION  03-05-2015   laminectomy /  nerve decompression--  L4 -- S1  . TOTAL KNEE ARTHROPLASTY Left 09/21/2013   Procedure: LEFT TOTAL KNEE ARTHROPLASTY;  Surgeon: Yvette Rack., MD;  Location: [facility];  Service: Orthopedics;  Laterality: Left;  . TRANSURETHRAL RESECTION OF PROSTATE N/A 06/16/2015   Procedure: TRANSURETHRAL RESECTION OF THE PROSTATE WITH GYRUS INSTRUMENTS;  Surgeon: Franchot Gallo, MD;  Location: [facility];  Service: Urology;  Laterality: N/A;        Home Medications    Prior to Admission medications   Medication Sig Start Date End Date Taking? Authorizing Provider  amoxicillin-clavulanate (AUGMENTIN) 875-125 MG tablet Take 1 tablet by mouth 2 (two) times daily.   Yes [provider]  atorvastatin (LIPITOR) 20 MG tablet Take 20 mg by mouth daily.   Yes [provider]  cyclobenzaprine (FLEXERIL) 10 MG tablet Take 10 mg by mouth at bedtime as needed for muscle spasms.   Yes [provider]  gabapentin (NEURONTIN) 300 MG capsule Take 300 mg by mouth 3 (three) times daily.   Yes [provider]  meclizine (ANTIVERT) 25 MG tablet Take 25 mg by mouth 2 (two) times daily.   Yes [provider]  metFORMIN (GLUCOPHAGE) 500 MG tablet Take 1,000 mg by mouth 2 (two) times daily with a meal.   Yes [provider]  tamsulosin (FLOMAX) 0.4 MG CAPS capsule Take 1 capsule  (0.4 mg total) by mouth daily. 02/01/17  Yes Duffy Bruce, MD  artificial tears (LACRILUBE) OINT ophthalmic ointment Place into both eyes every 3 (three) hours as needed for dry eyes. Patient not taking: Reported on 02/21/2018 05/16/17   Virgel Manifold, MD  docusate sodium (COLACE) 100 MG capsule Take 1 capsule (100 mg total) by mouth 2 (two) times daily. Patient not taking: Reported on 02/21/2018 01/26/17   Newman Pies, MD  DULoxetine (CYMBALTA) 60 MG capsule Take 1 capsule (60 mg total) by mouth daily. Patient not taking: Reported on 02/21/2018 01/17/14   Marcial Pacas, MD  HYDROcodone-acetaminophen (NORCO/VICODIN) 5-325 MG tablet Take 1 tablet by mouth every 6 (six) hours as needed. Patient not taking: Reported on 02/21/2018 09/05/17   Hedges, Dellis Filbert, PA-C  orphenadrine (NORFLEX) 100 MG tablet Take 1 tablet (100 mg total) by mouth 2 (two) times daily. Patient not taking: Reported on 3/41/9622 09/10/77   Delora Fuel, MD  oxyCODONE (ROXICODONE) 15 MG immediate release tablet Take 1 tablet (15 mg total) by mouth every 4 (four) hours as needed for moderate pain. Patient not taking: Reported on 02/21/2018 01/26/17   Newman Pies, MD  predniSONE (DELTASONE) 20 MG tablet Take 2 tablets (40 mg total) by mouth daily. Patient not taking: Reported on 02/21/2018 05/16/17   Virgel Manifold, MD    Family History History reviewed. No pertinent family history.  Social History Social History   Tobacco Use  . Smoking status: Never Smoker  . Smokeless tobacco: Never Used  Substance Use Topics  . Alcohol use: Yes    Alcohol/week: 3.6 oz    Types: 6 Cans of beer per week  . Drug use: No    Types: Cocaine, "Crack" cocaine    Comment: per pt quit and last used crack 06-03-2013     Allergies   Patient has no known allergies.   Review of Systems Review of Systems  Ten systems reviewed and are negative for acute change, except as noted in the HPI.   Physical Exam Updated Vital Signs BP (!) 147/82  (BP Location: Right Arm)   Pulse 95   Temp 98.2 F (36.8 C) (Oral)   Resp 16   SpO2 100%   Physical Exam  Constitutional: He appears well-developed and well-nourished. No distress.  HENT:  Head: Normocephalic and atraumatic.  Eyes: Pupils are equal, round, and reactive to light. Conjunctivae and EOM are normal. No scleral icterus.  Neck: Normal range of motion. Neck supple.  Extremely large palpable and tender node on the right side of the neck.  Pain with right and left lateral rotation  Cardiovascular: Normal rate, regular rhythm and normal heart sounds.  Pulmonary/Chest: Effort normal and breath sounds normal. No respiratory distress.  Abdominal: Soft. There is no tenderness.  Musculoskeletal: He exhibits no edema.  Lymphadenopathy:  He has cervical adenopathy.  Neurological: He is alert.  Skin: Skin is warm and dry. He is not diaphoretic.  Psychiatric: His behavior is normal.  Nursing note and vitals reviewed.    ED Treatments / Results  Labs (all labs ordered are listed, but only abnormal results are displayed) Labs Reviewed  COMPREHENSIVE METABOLIC PANEL - Abnormal; Notable for the following components:      Result Value   Glucose, Bld 102 (*)    All other components within normal limits  CBC WITH DIFFERENTIAL/PLATELET    EKG None  Radiology Ct Soft Tissue Neck W Contrast  Result Date: 02/21/2018 CLINICAL DATA:  RIGHT neck swelling for several weeks, not improved on antibiotics. Increasing pain and dysphagia. RIGHT ear and eye pain. History of substance abuse, cervical spine surgery. EXAM: CT NECK WITH CONTRAST TECHNIQUE: Multidetector CT imaging of the neck was performed using the standard protocol following the bolus administration of intravenous contrast. CONTRAST:  37mL OMNIPAQUE IOHEXOL 300 MG/ML  SOLN COMPARISON:  None. FINDINGS: PHARYNX AND LARYNX: Normal.  Widely patent airway. SALIVARY GLANDS: Normal. THYROID: Normal. LYMPH NODES: RIGHT level 2 a  heterogeneous 2.3 x 2.5 cm nodal conglomeration. Subcentimeter LEFT level 2 a lymph node. VASCULAR: 2 vessel aortic arch is a normal variant. Patent cervical vessels with mild intimal thickening. LIMITED INTRACRANIAL: Normal. VISUALIZED ORBITS: Normal. MASTOIDS AND VISUALIZED PARANASAL SINUSES: RIGHT middle ear and mastoid effusion without air cell coalescence. Soft tissue RIGHT external auditory canal, probable cerumen. SKELETON: Nonacute. Old RIGHT anterior maxillary wall fracture. Multiple absent teeth. C4-5 ACDF, grade 1 C3-4 retrolisthesis. Severe canal stenosis C3-4. Multilevel severe neural foraminal narrowing. Old nonunited T1 spinous process fracture. UPPER CHEST: Lung apices are clear. No superior mediastinal lymphadenopathy. OTHER: None. IMPRESSION: 1. RIGHT level 2a 2.3 x 2.5 cm pathologic nodal conglomeration highly concerning for metastatic disease. No identified primary within the head or neck. Recommend PET-CT and/or histopathologic correlation. 2. RIGHT middle ear and mastoid effusion. 3. Status post C4-5 ACDF with C3-4 adjacent segment disease resulting in severe canal stenosis. Are non emergent MRI of the cervical spine indicated to assess cord compression or cord edema. 4. Acute findings discussed with and reconfirmed by SOFIA on 02/21/2018 at 8:26 pm. Electronically Signed   By: Elon Alas M.D.   On: 02/21/2018 20:26    Procedures Procedures (including critical care time)  Medications Ordered in ED Medications  iohexol (OMNIPAQUE) 300 MG/ML solution 75 mL (75 mLs Intravenous Contrast Given 02/21/18 1955)     Initial Impression / Assessment and Plan / ED Course  I have reviewed the triage vital signs and the nursing notes.  Pertinent labs & imaging results that were available during my care of the patient were reviewed by me and considered in my medical decision making (see chart for details).     Patient CT scan with large conglomerate ration of lymph nodes concerning for  metastases.  I discussed the case with Dr. Janace Hoard he states that he may be seen in the office this week for needle aspiration.  I also discussed the findings of the CT scan with the patient and I have know that the differential does include cancer but also inflammatory infectious etiologies.  Patient understands and understands the necessity for follow-up.  He has patent airway and is able to tolerate soft foods.  He was given applesauce.  Patient appears appropriate for discharge with outpatient follow-up.  Final Clinical Impressions(s) / ED Diagnoses   Final diagnoses:  Cervical lymphadenopathy  ED Discharge Orders    None       Margarita Mail, PA-C 02/22/18 0000    Tegeler, Gwenyth Allegra, MD 02/22/18 [phone]

## 2018-02-21 NOTE — ED Notes (Addendum)
Pt called for vitals x3. 

## 2018-02-28 ENCOUNTER — Other Ambulatory Visit (HOSPITAL_COMMUNITY): Payer: Self-pay | Admitting: Otolaryngology

## 2018-02-28 DIAGNOSIS — R221 Localized swelling, mass and lump, neck: Secondary | ICD-10-CM

## 2018-03-03 NOTE — Pre-Procedure Instructions (Signed)
[name]  03/03/2018      [place], [place] [address] [place] [address] Phone: [phone] Fax: [phone]    Your procedure is scheduled on Fri., Sept. 20, 2019 from 7:30AM-9:45AM  Report to [facility] Admitting Entrance "A" at 5:30AM  Call this number if you have problems the morning of surgery:  [phone]   Remember:  Do not eat or drink after midnight on Sept. 19th    Take these medicines the morning of surgery with A SIP OF WATER: Gabapentin (NEURONTIN), Tamsulosin (FLOMAX), and Meclizine (ANTIVERT)   7 days before surgery (9/13), stop taking all Other Aspirin Products, Vitamins, Fish oils, and Herbal medications. Also stop all NSAIDS i.e. Advil, Ibuprofen, Motrin, Aleve, Anaprox, Naproxen, BC, Goody Powders, and all Supplements.  How to Manage Your Diabetes Before and After Surgery  Why is it important to control my blood sugar before and after surgery? . Improving blood sugar levels before and after surgery helps healing and can limit problems. . A way of improving blood sugar control is eating a healthy diet by: o  Eating less sugar and carbohydrates o  Increasing activity/exercise o  Talking with your doctor about reaching your blood sugar goals . High blood sugars (greater than 180 mg/dL) can raise your risk of infections and slow your recovery, so you will need to focus on controlling your diabetes during the weeks before surgery. . Make sure that the doctor who takes care of your diabetes knows about your planned surgery including the date and location.  How do I manage my blood sugar before surgery? . Check your blood sugar at least 4 times a day, starting 2 days before surgery, to make sure that the level is not too high or low. o Check your blood sugar the morning of your surgery when you wake up and every 2 hours until you get to the Short Stay unit. . If your blood sugar is  less than 70 mg/dL, you will need to treat for low blood sugar: o Do not take insulin. o Treat a low blood sugar (less than 70 mg/dL) with  cup of clear juice (cranberry or apple), 4 glucose tablets, OR glucose gel. Recheck blood sugar in 15 minutes after treatment (to make sure it is greater than 70 mg/dL). If your blood sugar is not greater than 70 mg/dL on recheck, call [phone] o  for further instructions. .  If your CBG is greater than 220 mg/dL, inform the staff upon arrival to Short Stay.  . If you are admitted to the hospital after surgery: o Your blood sugar will be checked by the staff and you will probably be given insulin after surgery (instead of oral diabetes medicines) to make sure you have good blood sugar levels. o The goal for blood sugar control after surgery is 80-180 mg/dL.  WHAT DO I DO ABOUT MY DIABETES MEDICATION?  Marland Kitchen Do not take MetFORMIN (GLUCOPHAGE) the morning of surgery.  Reviewed and Endorsed by [facility] Patient Education Committee, August 2015    Do not wear jewelry.  Do not wear lotions, powders, colognes, or deodorant.  Do not shave 48 hours prior to surgery.  Men may shave face.  Do not bring valuables to the hospital.  [facility] is not responsible for any belongings or valuables.  Contacts, dentures or bridgework may not be worn into surgery.  Leave your suitcase in the car.  After surgery it may be brought to your room.  For patients admitted to the hospital, discharge time will be determined by your treatment team.  Patients discharged the day of surgery will not be allowed to drive home.   Special instructions:   [facility]- Preparing For Surgery  Before surgery, you can play an important role. Because skin is not sterile, your skin needs to be as free of germs as possible. You can reduce the number of germs on your skin by washing with CHG (chlorahexidine gluconate) Soap before surgery.  CHG is an antiseptic cleaner which kills germs  and bonds with the skin to continue killing germs even after washing.    Oral Hygiene is also important to reduce your risk of infection.  Remember - BRUSH YOUR TEETH THE MORNING OF SURGERY WITH YOUR REGULAR TOOTHPASTE  Please do not use if you have an allergy to CHG or antibacterial soaps. If your skin becomes reddened/irritated stop using the CHG.  Do not shave (including legs and underarms) for at least 48 hours prior to first CHG shower. It is OK to shave your face.  Please follow these instructions carefully.   1. Shower the NIGHT BEFORE SURGERY and the MORNING OF SURGERY with CHG.   2. If you chose to wash your hair, wash your hair first as usual with your normal shampoo.  3. After you shampoo, rinse your hair and body thoroughly to remove the shampoo.  4. Use CHG as you would any other liquid soap. You can apply CHG directly to the skin and wash gently with a scrungie or a clean washcloth.   5. Apply the CHG Soap to your body ONLY FROM THE NECK DOWN.  Do not use on open wounds or open sores. Avoid contact with your eyes, ears, mouth and genitals (private parts). Wash Face and genitals (private parts)  with your normal soap.  6. Wash thoroughly, paying special attention to the area where your surgery will be performed.  7. Thoroughly rinse your body with warm water from the neck down.  8. DO NOT shower/wash with your normal soap after using and rinsing off the CHG Soap.  9. Pat yourself dry with a CLEAN TOWEL.  10. Wear CLEAN PAJAMAS to bed the night before surgery, wear comfortable clothes the morning of surgery  11. Place CLEAN SHEETS on your bed the night of your first shower and DO NOT SLEEP WITH PETS.  Day of Surgery:  Do not apply any deodorants/lotions.  Please wear clean clothes to the hospital/surgery center.   Remember to brush your teeth WITH YOUR REGULAR TOOTHPASTE.  Please read over the following fact sheets that you were given. Pain Booklet, Coughing and Deep  Breathing, MRSA Information and Surgical Site Infection Prevention

## 2018-03-06 ENCOUNTER — Encounter (HOSPITAL_COMMUNITY)
Admission: RE | Admit: 2018-03-06 | Discharge: 2018-03-06 | Disposition: A | Discharge status: Home / Self Care | Payer: Medicare Other | Source: Ambulatory Visit | Attending: Orthopedic Surgery | Admitting: Orthopedic Surgery

## 2018-03-06 ENCOUNTER — Other Ambulatory Visit: Payer: Self-pay | Admitting: Otolaryngology

## 2018-03-07 ENCOUNTER — Other Ambulatory Visit: Payer: Self-pay | Admitting: Radiology

## 2018-03-08 ENCOUNTER — Other Ambulatory Visit: Payer: Self-pay | Admitting: Radiology

## 2018-03-09 ENCOUNTER — Encounter (HOSPITAL_COMMUNITY): Payer: Self-pay

## 2018-03-09 ENCOUNTER — Other Ambulatory Visit: Payer: Self-pay

## 2018-03-09 ENCOUNTER — Ambulatory Visit (HOSPITAL_COMMUNITY)
Admission: RE | Admit: 2018-03-09 | Discharge: 2018-03-09 | Disposition: A | Discharge status: Home / Self Care | Payer: Medicare Other | Source: Ambulatory Visit | Attending: Otolaryngology | Admitting: Otolaryngology

## 2018-03-09 DIAGNOSIS — R221 Localized swelling, mass and lump, neck: Secondary | ICD-10-CM

## 2018-03-09 DIAGNOSIS — G8929 Other chronic pain: Secondary | ICD-10-CM | POA: Insufficient documentation

## 2018-03-09 DIAGNOSIS — N400 Enlarged prostate without lower urinary tract symptoms: Secondary | ICD-10-CM | POA: Diagnosis not present

## 2018-03-09 DIAGNOSIS — Z79899 Other long term (current) drug therapy: Secondary | ICD-10-CM | POA: Diagnosis not present

## 2018-03-09 DIAGNOSIS — Z7952 Long term (current) use of systemic steroids: Secondary | ICD-10-CM | POA: Diagnosis not present

## 2018-03-09 DIAGNOSIS — F329 Major depressive disorder, single episode, unspecified: Secondary | ICD-10-CM | POA: Diagnosis not present

## 2018-03-09 DIAGNOSIS — B182 Chronic viral hepatitis C: Secondary | ICD-10-CM | POA: Insufficient documentation

## 2018-03-09 DIAGNOSIS — M5137 Other intervertebral disc degeneration, lumbosacral region: Secondary | ICD-10-CM | POA: Diagnosis not present

## 2018-03-09 DIAGNOSIS — F419 Anxiety disorder, unspecified: Secondary | ICD-10-CM | POA: Diagnosis not present

## 2018-03-09 DIAGNOSIS — Z792 Long term (current) use of antibiotics: Secondary | ICD-10-CM | POA: Insufficient documentation

## 2018-03-09 DIAGNOSIS — Z96652 Presence of left artificial knee joint: Secondary | ICD-10-CM | POA: Insufficient documentation

## 2018-03-09 DIAGNOSIS — Z7984 Long term (current) use of oral hypoglycemic drugs: Secondary | ICD-10-CM | POA: Diagnosis not present

## 2018-03-09 DIAGNOSIS — Z9889 Other specified postprocedural states: Secondary | ICD-10-CM | POA: Diagnosis not present

## 2018-03-09 DIAGNOSIS — R590 Localized enlarged lymph nodes: Secondary | ICD-10-CM | POA: Insufficient documentation

## 2018-03-09 DIAGNOSIS — Z79891 Long term (current) use of opiate analgesic: Secondary | ICD-10-CM | POA: Insufficient documentation

## 2018-03-09 LAB — CBC
HEMATOCRIT: 45.2 % (ref 39.0–52.0)
HEMOGLOBIN: 14.8 g/dL (ref 13.0–17.0)
MCH: 29.2 pg (ref 26.0–34.0)
MCHC: 32.7 g/dL (ref 30.0–36.0)
MCV: 89.3 fL (ref 78.0–100.0)
Platelets: 285 10*3/uL (ref 150–400)
RBC: 5.06 MIL/uL (ref 4.22–5.81)
RDW: 12.7 % (ref 11.5–15.5)
WBC: 6.7 10*3/uL (ref 4.0–10.5)

## 2018-03-09 LAB — PROTIME-INR
INR: 1.04
Prothrombin Time: 13.5 seconds (ref 11.4–15.2)

## 2018-03-09 LAB — GLUCOSE, CAPILLARY: GLUCOSE-CAPILLARY: 98 mg/dL (ref 70–99)

## 2018-03-09 MED ORDER — LIDOCAINE HCL (PF) 1 % IJ SOLN
INTRAMUSCULAR | Status: AC
  Filled 2018-03-09: qty 30

## 2018-03-09 MED ORDER — HYDROCODONE-ACETAMINOPHEN 5-325 MG PO TABS: 1.0000 | ORAL_TABLET | ORAL | Status: DC | PRN

## 2018-03-09 MED ORDER — SODIUM CHLORIDE 0.9 % IV SOLN: INTRAVENOUS | Status: DC

## 2018-03-09 MED ORDER — FENTANYL CITRATE (PF) 100 MCG/2ML IJ SOLN
INTRAMUSCULAR | Status: AC
  Filled 2018-03-09: qty 2

## 2018-03-09 MED ORDER — MIDAZOLAM HCL 2 MG/2ML IJ SOLN
INTRAMUSCULAR | Status: AC | PRN
  Administered 2018-03-09: 1 mg via INTRAVENOUS

## 2018-03-09 MED ORDER — MIDAZOLAM HCL 2 MG/2ML IJ SOLN
INTRAMUSCULAR | Status: AC
  Filled 2018-03-09: qty 2

## 2018-03-09 MED ORDER — FENTANYL CITRATE (PF) 100 MCG/2ML IJ SOLN
INTRAMUSCULAR | Status: AC | PRN
  Administered 2018-03-09: 50 ug via INTRAVENOUS

## 2018-03-09 MED ORDER — SODIUM CHLORIDE 0.9 % IV SOLN
INTRAVENOUS | Status: AC | PRN
  Administered 2018-03-09: 10 mL/h via INTRAVENOUS

## 2018-03-09 NOTE — H&P (Signed)
Chief Complaint: Patient was seen in consultation today for biopsy of right neck adenopathy at the request of Jerrell Belfast  Referring Physician(s): Jerrell Belfast  Supervising Physician: Arne Cleveland  Patient Status: [facility] - Out-pt  History of Present Illness: [name] is a 67 y.o. male being worked up for adenopathy of the right neck. His CT is reviewed and IR is requested to do perc biopsy. The pt actually reports that he thinks the nodule has gotten some smaller but biopsy is still requested. PMHx, meds, labs, imaging, allergies reviewed. Feels well, no recent fevers, chills, illness. Has been NPO today as directed. Family at bedside.   Past Medical History:  Diagnosis Date  . Alcohol abuse   . Anxiety   . Arthritis   . BPH (benign prostatic hyperplasia)   . Chronic hepatitis C without hepatic coma ([place]) FOLLOWED BY INFECTOUS DISEASE DR COMER   POSITIVE ANTIBODY TEST THIS YEAR 2016--  CURRENT TX ON HARVONI PO  . Chronic low back pain   . Collapsed lung    right .Marland Kitchen..15 yrs ago, fell and hit some bricks  . DDD (degenerative disc disease), lumbosacral   . Depression    no meds now- med used for bladder  . Foley catheter in place   . GERD (gastroesophageal reflux disease)    hx none in long time  . History of cocaine abuse    per pt quit and last used 06-03-2013  . Urinary retention     Past Surgical History:  Procedure Laterality Date  . APPENDECTOMY  1980's  . BACK SURGERY    . CERVICAL DISC SURGERY    . CLOSED REDUCTION AND INTRAMAXILLARY FIXATION BILATERAL MENTAL FX'S/  PLACEMENT MAXILLARY STENT/ APERTURE WIRE PLACEMENT  01-04-2000  . JOINT REPLACEMENT    . POSTERIOR LUMBAR FUSION  03-05-2015   laminectomy /  nerve decompression--  L4 -- S1  . TOTAL KNEE ARTHROPLASTY Left 09/21/2013   Procedure: LEFT TOTAL KNEE ARTHROPLASTY;  Surgeon: Yvette Rack., MD;  Location: [facility];  Service: Orthopedics;  Laterality: Left;  . TRANSURETHRAL RESECTION OF  PROSTATE N/A 06/16/2015   Procedure: TRANSURETHRAL RESECTION OF THE PROSTATE WITH GYRUS INSTRUMENTS;  Surgeon: Franchot Gallo, MD;  Location: [facility];  Service: Urology;  Laterality: N/A;    Allergies: Patient has no known allergies.  Medications: Prior to Admission medications   Medication Sig Start Date End Date Taking? Authorizing Provider  amoxicillin-clavulanate (AUGMENTIN) 875-125 MG tablet Take 1 tablet by mouth 2 (two) times daily.    [provider]  artificial tears (LACRILUBE) OINT ophthalmic ointment Place into both eyes every 3 (three) hours as needed for dry eyes. Patient not taking: Reported on 02/21/2018 05/16/17   Virgel Manifold, MD  atorvastatin (LIPITOR) 20 MG tablet Take 20 mg by mouth daily.    [provider]  cyclobenzaprine (FLEXERIL) 10 MG tablet Take 10 mg by mouth at bedtime as needed for muscle spasms.    [provider]  docusate sodium (COLACE) 100 MG capsule Take 1 capsule (100 mg total) by mouth 2 (two) times daily. Patient not taking: Reported on 02/21/2018 01/26/17   Newman Pies, MD  DULoxetine (CYMBALTA) 60 MG capsule Take 1 capsule (60 mg total) by mouth daily. Patient not taking: Reported on 02/21/2018 01/17/14   Marcial Pacas, MD  gabapentin (NEURONTIN) 300 MG capsule Take 300 mg by mouth 3 (three) times daily.    [provider]  HYDROcodone-acetaminophen (NORCO/VICODIN) 5-325 MG tablet Take  1 tablet by mouth every 6 (six) hours as needed. Patient not taking: Reported on 02/21/2018 09/05/17   Hedges, Dellis Filbert, PA-C  meclizine (ANTIVERT) 25 MG tablet Take 25 mg by mouth 2 (two) times daily.    [provider]  metFORMIN (GLUCOPHAGE) 500 MG tablet Take 1,000 mg by mouth 2 (two) times daily with a meal.    [provider]  orphenadrine (NORFLEX) 100 MG tablet Take 1 tablet (100 mg total) by mouth 2 (two) times daily. Patient not taking: Reported on 1/61/0960 11/05/38   Delora Fuel, MD    oxyCODONE (ROXICODONE) 15 MG immediate release tablet Take 1 tablet (15 mg total) by mouth every 4 (four) hours as needed for moderate pain. Patient not taking: Reported on 02/21/2018 01/26/17   Newman Pies, MD  predniSONE (DELTASONE) 20 MG tablet Take 2 tablets (40 mg total) by mouth daily. Patient not taking: Reported on 02/21/2018 05/16/17   Virgel Manifold, MD  tamsulosin (FLOMAX) 0.4 MG CAPS capsule Take 1 capsule (0.4 mg total) by mouth daily. 02/01/17   Duffy Bruce, MD     History reviewed. No pertinent family history.  Social History   Socioeconomic History  . Marital status: Divorced    Spouse name: Not on file  . Number of children: Not on file  . Years of education: Not on file  . Highest education level: Not on file  Occupational History  . Not on file  Social Needs  . Financial resource strain: Not on file  . Food insecurity:    Worry: Not on file    Inability: Not on file  . Transportation needs:    Medical: Not on file    Non-medical: Not on file  Tobacco Use  . Smoking status: Never Smoker  . Smokeless tobacco: Never Used  Substance and Sexual Activity  . Alcohol use: Yes    Alcohol/week: 6.0 standard drinks    Types: 6 Cans of beer per week  . Drug use: No    Types: Cocaine, "Crack" cocaine    Comment: per pt quit and last used crack 06-03-2013  . Sexual activity: Not on file  Lifestyle  . Physical activity:    Days per week: Not on file    Minutes per session: Not on file  . Stress: Not on file  Relationships  . Social connections:    Talks on phone: Not on file    Gets together: Not on file    Attends religious service: Not on file    Active member of club or organization: Not on file    Attends meetings of clubs or organizations: Not on file    Relationship status: Not on file  Other Topics Concern  . Not on file  Social History Narrative  . Not on file     Review of Systems: A 12 point ROS discussed and pertinent positives are  indicated in the HPI above.  All other systems are negative.  Review of Systems  Vital Signs: BP (!) 135/92   Pulse 95   Temp 97.9 F (36.6 C) (Oral)   Ht 6\' 1"  (1.854 m)   Wt 99.8 kg   SpO2 97%   BMI 29.03 kg/m   Physical Exam  Constitutional: He is oriented to person, place, and time. He appears well-developed and well-nourished. No distress.  HENT:  Head: Normocephalic.  Mouth/Throat: Oropharynx is clear and moist.  Neck: Normal range of motion. No JVD present. No tracheal deviation present.  Palpable, mildly tender (  R)superior cervical adenopathy  Cardiovascular: Normal rate, regular rhythm and normal heart sounds.  Pulmonary/Chest: Effort normal and breath sounds normal. No respiratory distress.  Neurological: He is alert and oriented to person, place, and time.  Skin: Skin is warm and dry.  Psychiatric: He has a normal mood and affect.    Imaging: Ct Soft Tissue Neck W Contrast  Result Date: 02/21/2018 CLINICAL DATA:  RIGHT neck swelling for several weeks, not improved on antibiotics. Increasing pain and dysphagia. RIGHT ear and eye pain. History of substance abuse, cervical spine surgery. EXAM: CT NECK WITH CONTRAST TECHNIQUE: Multidetector CT imaging of the neck was performed using the standard protocol following the bolus administration of intravenous contrast. CONTRAST:  77mL OMNIPAQUE IOHEXOL 300 MG/ML  SOLN COMPARISON:  None. FINDINGS: PHARYNX AND LARYNX: Normal.  Widely patent airway. SALIVARY GLANDS: Normal. THYROID: Normal. LYMPH NODES: RIGHT level 2 a heterogeneous 2.3 x 2.5 cm nodal conglomeration. Subcentimeter LEFT level 2 a lymph node. VASCULAR: 2 vessel aortic arch is a normal variant. Patent cervical vessels with mild intimal thickening. LIMITED INTRACRANIAL: Normal. VISUALIZED ORBITS: Normal. MASTOIDS AND VISUALIZED PARANASAL SINUSES: RIGHT middle ear and mastoid effusion without air cell coalescence. Soft tissue RIGHT external auditory canal, probable cerumen.  SKELETON: Nonacute. Old RIGHT anterior maxillary wall fracture. Multiple absent teeth. C4-5 ACDF, grade 1 C3-4 retrolisthesis. Severe canal stenosis C3-4. Multilevel severe neural foraminal narrowing. Old nonunited T1 spinous process fracture. UPPER CHEST: Lung apices are clear. No superior mediastinal lymphadenopathy. OTHER: None. IMPRESSION: 1. RIGHT level 2a 2.3 x 2.5 cm pathologic nodal conglomeration highly concerning for metastatic disease. No identified primary within the head or neck. Recommend PET-CT and/or histopathologic correlation. 2. RIGHT middle ear and mastoid effusion. 3. Status post C4-5 ACDF with C3-4 adjacent segment disease resulting in severe canal stenosis. Are non emergent MRI of the cervical spine indicated to assess cord compression or cord edema. 4. Acute findings discussed with and reconfirmed by SOFIA on 02/21/2018 at 8:26 pm. Electronically Signed   By: Elon Alas M.D.   On: 02/21/2018 20:26    Labs:  CBC: Recent Labs    02/21/18 1840  WBC 7.1  HGB 14.5  HCT 46.0  PLT 272    COAGS: No results for input(s): INR, APTT in the last 8760 hours.  BMP: Recent Labs    02/21/18 1840  NA 142  K 4.0  CL 107  CO2 26  GLUCOSE 102*  BUN 8  CALCIUM 9.4  CREATININE 1.01  GFRNONAA >60  GFRAA >60    LIVER FUNCTION TESTS: Recent Labs    02/21/18 1840  BILITOT 0.4  AST 21  ALT 19  ALKPHOS 71  PROT 7.0  ALBUMIN 3.7    TUMOR MARKERS: No results for input(s): AFPTM, CEA, CA199, CHROMGRNA in the last 8760 hours.  Assessment and Plan: (R)neck adenopathy For US guided biopsy Labs pending Risks and benefits discussed with the patient including, but not limited to bleeding, infection, damage to adjacent structures or low yield requiring additional tests.  All of the patient's questions were answered, patient is agreeable to proceed. Consent signed and in chart.    Thank you for this interesting consult.  I greatly enjoyed meeting JEVEN TOPPER and  look forward to participating in their care.  A copy of this report was sent to the requesting provider on this date.  Electronically Signed: Ascencion Dike, PA-C 03/09/2018, 10:53 AM   I spent a total of 20 minutes in face to face in clinical consultation,  greater than 50% of which was counseling/coordinating care for LN biopsy

## 2018-03-09 NOTE — Discharge Instructions (Addendum)
Moderate Conscious Sedation, Adult, Care After These instructions provide you with information about caring for yourself after your procedure. Your health care provider may also give you more specific instructions. Your treatment has been planned according to current medical practices, but problems sometimes occur. Call your health care provider if you have any problems or questions after your procedure. What can I expect after the procedure? After your procedure, it is common:  To feel sleepy for several hours.  To feel clumsy and have poor balance for several hours.  To have poor judgment for several hours.  To vomit if you eat too soon.  Follow these instructions at home: For at least 24 hours after the procedure:   Do not: ? Participate in activities where you could fall or become injured. ? Drive. ? Use heavy machinery. ? Drink alcohol. ? Take sleeping pills or medicines that cause drowsiness. ? Make important decisions or sign legal documents. ? Take care of children on your own.  Rest. Eating and drinking  Follow the diet recommended by your health care provider.  If you vomit: ? Drink water, juice, or soup when you can drink without vomiting. ? Make sure you have little or no nausea before eating solid foods. General instructions  Have a responsible adult stay with you until you are awake and alert.  Take over-the-counter and prescription medicines only as told by your health care provider.  If you smoke, do not smoke without supervision.  Keep all follow-up visits as told by your health care provider. This is important. Contact a health care provider if:  You keep feeling nauseous or you keep vomiting.  You feel light-headed.  You develop a rash.  You have a fever. Get help right away if:  You have trouble breathing. This information is not intended to replace advice given to you by your health care provider. Make sure you discuss any questions you have  with your health care provider. Document Released: 05/09/2013 Document Revised: 12/22/2015 Document Reviewed: 11/08/2015 Elsevier Interactive Patient Education  2018 [place]. Lymph Node Biopsy, Care After Refer to this sheet in the next few weeks. These instructions provide you with information about caring for yourself after your procedure. Your health care provider may also give you more specific instructions. Your treatment has been planned according to current medical practices, but problems sometimes occur. Call your health care provider if you have any problems or questions after your procedure. What can I expect after the procedure? After the procedure, it is common to have:  Bruising.  Soreness.  Mild swelling.  Follow these instructions at home: Medicines  Take over-the-counter and prescription medicines only as told by your health care provider.  If you were prescribed an antibiotic medicine, take it as told by your health care provider. Do not stop taking the antibiotic even if you start to feel better. Incision care   Follow instructions from your health care provider about how to take care of your incision. Make sure you: ? Wash your hands with soap and water before you change your bandage (dressing). If soap and water are not available, use hand sanitizer. ? Change your dressing as told by your health care provider. ? Leave stitches (sutures), skin glue, or adhesive strips in place. These skin closures may need to stay in place for 2 weeks or longer. If adhesive strip edges start to loosen and curl up, you may trim the loose edges. Do not remove adhesive strips completely unless your health  care provider tells you to do that.  Check your incision area every day for signs of infection. Check for: ? More redness, swelling, or pain. ? More fluid or blood. ? Warmth. ? Pus or a bad smell. Driving  Do not drive for 24 hours if you received a sedative.  Do not drive  or operate heavy machinery while taking prescription pain medicine. General instructions   It is your responsibility to get the results of your procedure. Ask your health care provider or the department performing the procedure when your results will be ready.  Return to your normal activities as told by your health care provider. Ask your health care provider what activities are safe for you.  Do not take baths, swim, or use a hot tub until your health care provider approves.  Wear compression stockings as told by your health care provider. These stockings help to prevent blood clots and reduce swelling in your legs.  Keep all follow-up visits as told by your health care provider. This is important. Contact a health care provider if:  You have more redness, swelling, or pain around your incision.  You have more fluid or blood coming from your incision.  Your incision feels warm to the touch.  You have pus or a bad smell coming from your incision.  You have a fever.  You have pain or numbness that gets worse or lasts longer than a few days. This information is not intended to replace advice given to you by your health care provider. Make sure you discuss any questions you have with your health care provider. Document Released: 08/15/2015 Document Revised: 12/25/2015 Document Reviewed: 11/13/2014 Elsevier Interactive Patient Education  [employer].

## 2018-03-09 NOTE — Sedation Documentation (Signed)
02 d/c 

## 2018-03-09 NOTE — Sedation Documentation (Signed)
Patient denies pain and is resting comfortably.  

## 2018-03-09 NOTE — Sedation Documentation (Signed)
Patient is resting comfortably. 

## 2018-03-09 NOTE — Procedures (Signed)
  Procedure: [place] core R cerv LAN 18g x3 EBL:   minimal Complications:  none immediate  See full dictation in [facility].  Dillard Cannon MD Main # [phone] Pager  [phone]

## 2018-03-09 NOTE — Sedation Documentation (Addendum)
Patient is resting comfortably. 

## 2018-03-21 ENCOUNTER — Other Ambulatory Visit (HOSPITAL_COMMUNITY): Payer: Self-pay | Admitting: Otolaryngology

## 2018-03-21 DIAGNOSIS — R221 Localized swelling, mass and lump, neck: Secondary | ICD-10-CM

## 2018-03-31 ENCOUNTER — Ambulatory Visit (HOSPITAL_COMMUNITY)
Admission: RE | Admit: 2018-03-31 | Discharge: 2018-03-31 | Disposition: A | Discharge status: Home / Self Care | Payer: Medicare Other | Source: Ambulatory Visit | Attending: Otolaryngology | Admitting: Otolaryngology

## 2018-03-31 DIAGNOSIS — R221 Localized swelling, mass and lump, neck: Secondary | ICD-10-CM | POA: Diagnosis not present

## 2018-03-31 DIAGNOSIS — C801 Malignant (primary) neoplasm, unspecified: Secondary | ICD-10-CM | POA: Insufficient documentation

## 2018-03-31 DIAGNOSIS — Z9889 Other specified postprocedural states: Secondary | ICD-10-CM | POA: Insufficient documentation

## 2018-03-31 LAB — GLUCOSE, CAPILLARY: Glucose-Capillary: 107 mg/dL — ABNORMAL HIGH (ref 70–99)

## 2018-03-31 MED ORDER — FLUDEOXYGLUCOSE F - 18 (FDG) INJECTION
10.9000 | Freq: Once | INTRAVENOUS | Status: AC
  Administered 2018-03-31: 10.9 via INTRAVENOUS

## 2018-04-05 ENCOUNTER — Encounter: Payer: Self-pay | Admitting: Internal Medicine

## 2018-04-05 ENCOUNTER — Ambulatory Visit (INDEPENDENT_AMBULATORY_CARE_PROVIDER_SITE_OTHER): Payer: Medicare HMO | Admitting: Internal Medicine

## 2018-04-05 VITALS — BP 116/76 | HR 102 | Temp 97.6°F | Ht 73.0 in | Wt 212.0 lb

## 2018-04-05 DIAGNOSIS — K7400 Hepatic fibrosis, unspecified: Secondary | ICD-10-CM

## 2018-04-05 DIAGNOSIS — B182 Chronic viral hepatitis C: Secondary | ICD-10-CM

## 2018-04-05 NOTE — Assessment & Plan Note (Signed)
F2/3 on elastography.  No indication for [facility] screening

## 2018-04-05 NOTE — Progress Notes (Signed)
   Subjective:    Patient ID: [name], male    DOB: 07/06/1951, 67 y.o.   MRN: [id number]  HPI Here for follow up of hepatitis C I treated him about 3 years ago with Harvoni for genotype 1a disease for 12 weeks.  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 He though never returned for follow up after starting but did finish treatment.  He was sent here needing verification of treatment prior to elective knee replacement by Dr. French Ana.  His antibody test was positive at his PCP so sent here.  Genotype was sent as well but no HCV virus to do gentoype   Review of Systems  Constitutional: Negative for fatigue.  Gastrointestinal: Negative for diarrhea and nausea.       Objective:   Physical Exam  Constitutional: He appears well-developed and well-nourished. No distress.  HENT:  Mouth/Throat: No oropharyngeal exudate.  Cardiovascular: Normal rate, regular rhythm and normal heart sounds.  Skin: No rash noted.          Assessment & Plan:

## 2018-04-05 NOTE — Assessment & Plan Note (Signed)
Will check his HCV RNA to confirm cure.  If negative, no follow up indicated.  Genotype negative recently suggesting his RNA is negative.

## 2018-04-08 LAB — HEPATITIS C RNA QUANTITATIVE
HCV Quantitative Log: <1.18 Log IU/mL
HCV RNA, PCR, QN: NOT DETECTED [IU]/mL

## 2018-04-17 ENCOUNTER — Encounter (HOSPITAL_COMMUNITY)
Admission: RE | Admit: 2018-04-17 | Discharge: 2018-04-17 | Disposition: A | Discharge status: Home / Self Care | Payer: Medicare HMO | Source: Ambulatory Visit | Attending: Otolaryngology | Admitting: Otolaryngology

## 2018-04-17 ENCOUNTER — Other Ambulatory Visit: Payer: Self-pay | Admitting: Otolaryngology

## 2018-04-17 ENCOUNTER — Other Ambulatory Visit: Payer: Self-pay

## 2018-04-17 ENCOUNTER — Encounter (HOSPITAL_COMMUNITY): Payer: Self-pay

## 2018-04-17 DIAGNOSIS — Z01818 Encounter for other preprocedural examination: Secondary | ICD-10-CM | POA: Insufficient documentation

## 2018-04-17 DIAGNOSIS — R221 Localized swelling, mass and lump, neck: Secondary | ICD-10-CM | POA: Diagnosis not present

## 2018-04-17 DIAGNOSIS — E119 Type 2 diabetes mellitus without complications: Secondary | ICD-10-CM | POA: Insufficient documentation

## 2018-04-17 HISTORY — DX: Male erectile dysfunction, unspecified: N52.9

## 2018-04-17 HISTORY — DX: Bell's palsy: G51.0

## 2018-04-17 HISTORY — DX: Type 2 diabetes mellitus without complications: E11.9

## 2018-04-17 LAB — COMPREHENSIVE METABOLIC PANEL
ALBUMIN: 3.9 g/dL (ref 3.5–5.0)
ALK PHOS: 64 U/L (ref 38–126)
ALT: 18 U/L (ref 0–44)
AST: 23 U/L (ref 15–41)
Anion gap: 7 (ref 5–15)
BILIRUBIN TOTAL: 0.5 mg/dL (ref 0.3–1.2)
BUN: 14 mg/dL (ref 8–23)
CALCIUM: 9.7 mg/dL (ref 8.9–10.3)
CO2: 27 mmol/L (ref 22–32)
Chloride: 106 mmol/L (ref 98–111)
Creatinine, Ser: 0.93 mg/dL (ref 0.61–1.24)
GFR calc Af Amer: >60 mL/min (ref >60)
GFR calc non Af Amer: >60 mL/min (ref >60)
GLUCOSE: 101 mg/dL — AB (ref 70–99)
Potassium: 5 mmol/L (ref 3.5–5.1)
Sodium: 140 mmol/L (ref 135–145)
TOTAL PROTEIN: 7.4 g/dL (ref 6.5–8.1)

## 2018-04-17 LAB — GLUCOSE, CAPILLARY: Glucose-Capillary: 99 mg/dL (ref 70–99)

## 2018-04-17 NOTE — Pre-Procedure Instructions (Signed)
[name]  04/17/2018      Your procedure is scheduled on Friday, September 20.  Report to [facility] Admitting at 7:40 AM               Your surgery or procedure is scheduled for 9:40 AM.    Remember:  Do not eat or drink after midnight Thursday, September 19.    Take these medicines the morning of surgery with A SIP OF WATER :  >>> Do not take  Pills Medication for diabetes the morning of surgery.<<<<  Special instructions:   [facility]- Preparing For Surgery  Before surgery, you can play an important role. Because skin is not sterile, your skin needs to be as free of germs as possible. You can reduce the number of germs on your skin by washing with CHG (chlorahexidine gluconate) Soap before surgery.  CHG is an antiseptic cleaner which kills germs and bonds with the skin to continue killing germs even after washing.    Oral Hygiene is also important to reduce your risk of infection.  Remember - BRUSH YOUR TEETH THE MORNING OF SURGERY WITH YOUR REGULAR TOOTHPASTE  Please do not use if you have an allergy to CHG or antibacterial soaps. If your skin becomes reddened/irritated stop using the CHG.  Do not shave (including legs and underarms) for at least 48 hours prior to first CHG shower. It is OK to shave your face.  Please follow these instructions carefully.   1. Shower the NIGHT BEFORE SURGERY and the MORNING OF SURGERY with CHG.   2. If you chose to wash your hair, wash your hair first as usual with your normal shampoo.  3. After you shampoo: Wash your face and private area with the soap you use at home, then  rinse your hair and body thoroughly to remove the shampoo.  4. Use CHG as you would any other liquid soap. You can apply CHG directly to the skin and wash gently with a scrungie or a clean washcloth.   5. Apply the CHG Soap to your body ONLY FROM THE NECK DOWN.  Do not use on open wounds or open sores. Avoid contact with your eyes, ears, mouth and  genitals (private parts). Wash Face and genitals (private parts)  with your normal soap.  6. Wash thoroughly, paying special attention to the area where your surgery will be performed.  7. Thoroughly rinse your body with warm water from the neck down.  8. DO NOT shower/wash with your normal soap after using and rinsing off the CHG Soap.  9. Pat yourself dry with a CLEAN TOWEL.  10. Wear CLEAN PAJAMAS to bed the night before surgery, wear comfortable clothes the morning of surgery  11. Place CLEAN SHEETS on your bed the night of your first shower and DO NOT SLEEP WITH PETS.  Day of Surgery:  Shower As Above Do not apply any deodorants/lotions, powders or colognes..  Please wear clean clothes to the hospital/surgery center.   Remember to brush your teeth WITH YOUR REGULAR TOOTHPASTE.  Do not wear jewelry, make-up or nail polish.  Do not shave 48 hours prior to surgery.  Men may shave face and neck.  Do not bring valuables to the hospital.  [facility] is not responsible for any belongings or valuables.  Contacts, dentures or bridgework may not be worn into surgery.  Leave your suitcase in the car.  After surgery it may be brought to your room.  For patients admitted to the hospital, discharge time will be determined by your treatment team.  Patients discharged the day of surgery will not be allowed to drive home.   Please read over the following fact sheets that you were given:  Pain Booklet, Coughing and Deep breathing. Spirometry, Surgical Site Infections.

## 2018-04-17 NOTE — Progress Notes (Signed)
   How to Manage Your Diabetes  Before and After Surgery  WHAT DO I DO ABOUT MY DIABETES MEDICATION?  Marland Kitchen Do not take oral diabetes medicines (pills) the morning of surgery. Why is it important to control my blood sugar before and after surgery? . Improving blood sugar levels before and after surgery helps healing and can limit problems. . A way of improving blood sugar control is eating a healthy diet by: o  Eating less sugar and carbohydrates o  Increasing activity/exercise o  Talking with your doctor about reaching your blood sugar goals . High blood sugars (greater than 180 mg/dL) can raise your risk of infections and slow your recovery, so you will need to focus on controlling your diabetes during the weeks before surgery. . Make sure that the doctor who takes care of your diabetes knows about your planned surgery including the date and location.  How do I manage my blood sugar before surgery? . Check your blood sugar at least 4 times a day, starting 2 days before surgery, to make sure that the level is not too high or low. o Check your blood sugar the morning of your surgery when you wake up and every 2 hours until you get to the Short Stay unit. . If your blood sugar is less than 70 mg/dL, you will need to treat for low blood sugar: o Do not take insulin. o Treat a low blood sugar (less than 70 mg/dL) with  cup of clear juice (cranberry or apple), 4 glucose tablets, OR glucose gel. Recheck blood sugar in 15 minutes after treatment (to make sure it is greater than 70 mg/dL). If your blood sugar is not greater than 70 mg/dL on recheck, call [phone] o  for further instructions. . Report your blood sugar to the short stay nurse when you get to Short Stay.  . If you are admitted to the hospital after surgery: o Your blood sugar will be checked by the staff and you will probably be given insulin after surgery (instead of oral diabetes medicines) to make sure you have good blood sugar  levels. o The goal for blood sugar control after surgery is 80-180 mg/dL.              WHAT DO I DO ABOUT MY DIABETES MEDICATION?  Marland Kitchen Do not take oral diabetes medicines (pills) the morning of surgery.

## 2018-04-17 NOTE — Pre-Procedure Instructions (Addendum)
[name]  04/17/2018      Your procedure is scheduled on Friday, September 20.  Report to [facility] Admitting at 7:40 AM               Your surgery or procedure is scheduled for 9:40 AM. For questions, problems or if you are running late, please call the pre surgery desk: [phone]   Remember:  Do not eat or drink after midnight Thursday, September 19.    Take these medicines the morning of surgery with A SIP OF WATER :  None>>> Do not take  Pills Medication for diabetes the morning of surgery.<<<<   Special instructions:   [place]- Preparing For Surgery  Before surgery, you can play an important role. Because skin is not sterile, your skin needs to be as free of germs as possible. You can reduce the number of germs on your skin by washing with CHG (chlorahexidine gluconate) Soap before surgery.  CHG is an antiseptic cleaner which kills germs and bonds with the skin to continue killing germs even after washing.    Oral Hygiene is also important to reduce your risk of infection.  Remember - BRUSH YOUR TEETH THE MORNING OF SURGERY WITH YOUR REGULAR TOOTHPASTE  Please do not use if you have an allergy to CHG or antibacterial soaps. If your skin becomes reddened/irritated stop using the CHG.  Do not shave (including legs and underarms) for at least 48 hours prior to first CHG shower. It is OK to shave your face.  Please follow these instructions carefully.   1. Shower the NIGHT BEFORE SURGERY and the MORNING OF SURGERY with CHG.   2. If you chose to wash your hair, wash your hair first as usual with your normal shampoo.  3. After you shampoo: Wash your face and private area with the soap you use at home, then  rinse your hair and body thoroughly to remove the shampoo.  4. Use CHG as you would any other liquid soap. You can apply CHG directly to the skin and wash gently with a scrungie or a clean washcloth.   5. Apply the CHG Soap to your body ONLY FROM THE  NECK DOWN.  Do not use on open wounds or open sores. Avoid contact with your eyes, ears, mouth and genitals (private parts). Wash Face and genitals (private parts)  with your normal soap.  6. Wash thoroughly, paying special attention to the area where your surgery will be performed.  7. Thoroughly rinse your body with warm water from the neck down.  8. DO NOT shower/wash with your normal soap after using and rinsing off the CHG Soap.  9. Pat yourself dry with a CLEAN TOWEL.  10. Wear CLEAN PAJAMAS to bed the night before surgery, wear comfortable clothes the morning of surgery  11. Place CLEAN SHEETS on your bed the night of your first shower and DO NOT SLEEP WITH PETS.  Day of Surgery:  Shower As Above Do not apply any deodorants/lotions, powders or colognes..  Please wear clean clothes to the hospital/surgery center.   Remember to brush your teeth WITH YOUR REGULAR TOOTHPASTE.  Do not wear jewelry, make-up or nail polish.  Do not shave 48 hours prior to surgery.  Men may shave face and neck.  Do not bring valuables to the hospital.  [facility] is not responsible for any belongings or valuables.  Contacts, dentures or bridgework may not be worn into surgery.  Leave your suitcase in the car.  After surgery it may be brought to your room.  For patients admitted to the hospital, discharge time will be determined by your treatment team.  Patients discharged the day of surgery will not be allowed to drive home.   Please read over the following fact sheets that you were given:  Pain Booklet, Coughing and Deep breathing. Spirometry, Surgical Site Infections.

## 2018-04-17 NOTE — Progress Notes (Signed)
Mr Karge denies chest pain or shortness of breath.  Patient states he has not seen a cardiologist.  [facility]  Patient does not check CBG nor have a machine to check it. CBG 99 this am fasting.  A1C will be drawn.  (Patient reportst hat ge has not had labs drawn in the past 2  months

## 2018-04-18 LAB — HEMOGLOBIN A1C
Hgb A1c MFr Bld: 6.3 % — ABNORMAL HIGH (ref 4.8–5.6)
Mean Plasma Glucose: 134 mg/dL

## 2018-04-21 ENCOUNTER — Ambulatory Visit (HOSPITAL_COMMUNITY): Payer: Medicare HMO | Admitting: Certified Registered Nurse Anesthetist

## 2018-04-21 ENCOUNTER — Encounter (HOSPITAL_COMMUNITY)
Admission: RE | Disposition: A | Discharge status: Home / Self Care | Payer: Self-pay | Source: Ambulatory Visit | Attending: Otolaryngology

## 2018-04-21 ENCOUNTER — Ambulatory Visit (HOSPITAL_COMMUNITY)
Admission: RE | Admit: 2018-04-21 | Discharge: 2018-04-21 | Disposition: A | Discharge status: Home / Self Care | Payer: Medicare HMO | Source: Ambulatory Visit | Attending: Otolaryngology | Admitting: Otolaryngology

## 2018-04-21 ENCOUNTER — Encounter (HOSPITAL_COMMUNITY): Payer: Self-pay | Admitting: *Deleted

## 2018-04-21 DIAGNOSIS — M545 Low back pain: Secondary | ICD-10-CM | POA: Insufficient documentation

## 2018-04-21 DIAGNOSIS — E119 Type 2 diabetes mellitus without complications: Secondary | ICD-10-CM | POA: Insufficient documentation

## 2018-04-21 DIAGNOSIS — Z79899 Other long term (current) drug therapy: Secondary | ICD-10-CM | POA: Insufficient documentation

## 2018-04-21 DIAGNOSIS — F419 Anxiety disorder, unspecified: Secondary | ICD-10-CM | POA: Diagnosis not present

## 2018-04-21 DIAGNOSIS — G8929 Other chronic pain: Secondary | ICD-10-CM | POA: Insufficient documentation

## 2018-04-21 DIAGNOSIS — Z7984 Long term (current) use of oral hypoglycemic drugs: Secondary | ICD-10-CM | POA: Insufficient documentation

## 2018-04-21 DIAGNOSIS — Z981 Arthrodesis status: Secondary | ICD-10-CM | POA: Insufficient documentation

## 2018-04-21 DIAGNOSIS — C801 Malignant (primary) neoplasm, unspecified: Secondary | ICD-10-CM | POA: Diagnosis not present

## 2018-04-21 DIAGNOSIS — M1990 Unspecified osteoarthritis, unspecified site: Secondary | ICD-10-CM | POA: Diagnosis not present

## 2018-04-21 DIAGNOSIS — N529 Male erectile dysfunction, unspecified: Secondary | ICD-10-CM | POA: Insufficient documentation

## 2018-04-21 DIAGNOSIS — C7989 Secondary malignant neoplasm of other specified sites: Secondary | ICD-10-CM | POA: Diagnosis present

## 2018-04-21 DIAGNOSIS — Z96652 Presence of left artificial knee joint: Secondary | ICD-10-CM | POA: Insufficient documentation

## 2018-04-21 DIAGNOSIS — B182 Chronic viral hepatitis C: Secondary | ICD-10-CM | POA: Diagnosis not present

## 2018-04-21 DIAGNOSIS — K219 Gastro-esophageal reflux disease without esophagitis: Secondary | ICD-10-CM | POA: Insufficient documentation

## 2018-04-21 DIAGNOSIS — F329 Major depressive disorder, single episode, unspecified: Secondary | ICD-10-CM | POA: Diagnosis not present

## 2018-04-21 DIAGNOSIS — R221 Localized swelling, mass and lump, neck: Secondary | ICD-10-CM

## 2018-04-21 DIAGNOSIS — N400 Enlarged prostate without lower urinary tract symptoms: Secondary | ICD-10-CM | POA: Insufficient documentation

## 2018-04-21 HISTORY — PX: TONSILLECTOMY: SHX5217

## 2018-04-21 HISTORY — PX: DIRECT LARYNGOSCOPY: SHX5326

## 2018-04-21 LAB — GLUCOSE, CAPILLARY
GLUCOSE-CAPILLARY: 127 mg/dL — AB (ref 70–99)
Glucose-Capillary: 92 mg/dL (ref 70–99)

## 2018-04-21 LAB — CBC
HCT: 44.4 % (ref 39.0–52.0)
Hemoglobin: 14.6 g/dL (ref 13.0–17.0)
MCH: 30 pg (ref 26.0–34.0)
MCHC: 32.9 g/dL (ref 30.0–36.0)
MCV: 91.4 fL (ref 78.0–100.0)
PLATELETS: 244 10*3/uL (ref 150–400)
RBC: 4.86 MIL/uL (ref 4.22–5.81)
RDW: 13.5 % (ref 11.5–15.5)
WBC: 5.6 10*3/uL (ref 4.0–10.5)

## 2018-04-21 SURGERY — LARYNGOSCOPY, DIRECT
Anesthesia: General | Site: Mouth

## 2018-04-21 MED ORDER — ROCURONIUM BROMIDE 10 MG/ML (PF) SYRINGE
PREFILLED_SYRINGE | INTRAVENOUS | Status: DC | PRN
  Administered 2018-04-21: 40 mg via INTRAVENOUS

## 2018-04-21 MED ORDER — ONDANSETRON HCL 4 MG/2ML IJ SOLN
INTRAMUSCULAR | Status: DC | PRN
  Administered 2018-04-21: 4 mg via INTRAVENOUS

## 2018-04-21 MED ORDER — ESMOLOL HCL 100 MG/10ML IV SOLN
INTRAVENOUS | Status: AC
  Filled 2018-04-21: qty 10

## 2018-04-21 MED ORDER — AMOXICILLIN-POT CLAVULANATE 250-62.5 MG/5ML PO SUSR: 10.0000 mL | Freq: Two times a day (BID) | ORAL | 0 refills | Status: AC

## 2018-04-21 MED ORDER — FENTANYL CITRATE (PF) 250 MCG/5ML IJ SOLN
INTRAMUSCULAR | Status: DC | PRN
  Administered 2018-04-21 (×3): 50 ug via INTRAVENOUS

## 2018-04-21 MED ORDER — HYDROCODONE-ACETAMINOPHEN 7.5-325 MG PO TABS: 1.0000 | ORAL_TABLET | Freq: Once | ORAL | Status: DC | PRN

## 2018-04-21 MED ORDER — FENTANYL CITRATE (PF) 100 MCG/2ML IJ SOLN
25.0000 ug | INTRAMUSCULAR | Status: DC | PRN
  Administered 2018-04-21: 50 ug via INTRAVENOUS

## 2018-04-21 MED ORDER — ESMOLOL HCL 100 MG/10ML IV SOLN
INTRAVENOUS | Status: DC | PRN
  Administered 2018-04-21: 20 mg via INTRAVENOUS
  Administered 2018-04-21: 30 mg via INTRAVENOUS
  Administered 2018-04-21: 20 mg via INTRAVENOUS

## 2018-04-21 MED ORDER — LABETALOL HCL 5 MG/ML IV SOLN
INTRAVENOUS | Status: DC | PRN
  Administered 2018-04-21: 5 mg via INTRAVENOUS

## 2018-04-21 MED ORDER — PROPOFOL 10 MG/ML IV BOLUS
INTRAVENOUS | Status: DC | PRN
  Administered 2018-04-21: 150 mg via INTRAVENOUS

## 2018-04-21 MED ORDER — ONDANSETRON HCL 4 MG/2ML IJ SOLN
INTRAMUSCULAR | Status: AC
  Filled 2018-04-21: qty 2

## 2018-04-21 MED ORDER — LIDOCAINE 2% (20 MG/ML) 5 ML SYRINGE
INTRAMUSCULAR | Status: DC | PRN
  Administered 2018-04-21: 80 mg via INTRAVENOUS

## 2018-04-21 MED ORDER — CHLORHEXIDINE GLUCONATE CLOTH 2 % EX PADS: 6.0000 | MEDICATED_PAD | Freq: Once | CUTANEOUS | Status: DC

## 2018-04-21 MED ORDER — MEPERIDINE HCL 50 MG/ML IJ SOLN: 6.2500 mg | INTRAMUSCULAR | Status: DC | PRN

## 2018-04-21 MED ORDER — LABETALOL HCL 5 MG/ML IV SOLN
INTRAVENOUS | Status: AC
  Filled 2018-04-21: qty 4

## 2018-04-21 MED ORDER — MIDAZOLAM HCL 2 MG/2ML IJ SOLN
INTRAMUSCULAR | Status: DC | PRN
  Administered 2018-04-21: 2 mg via INTRAVENOUS

## 2018-04-21 MED ORDER — LIDOCAINE 2% (20 MG/ML) 5 ML SYRINGE
INTRAMUSCULAR | Status: AC
  Filled 2018-04-21: qty 5

## 2018-04-21 MED ORDER — EPINEPHRINE HCL (NASAL) 0.1 % NA SOLN
NASAL | Status: AC
  Filled 2018-04-21: qty 30

## 2018-04-21 MED ORDER — FENTANYL CITRATE (PF) 100 MCG/2ML IJ SOLN
INTRAMUSCULAR | Status: AC
  Filled 2018-04-21: qty 2

## 2018-04-21 MED ORDER — CEFAZOLIN SODIUM-DEXTROSE 2-4 GM/100ML-% IV SOLN
2.0000 g | INTRAVENOUS | Status: AC
  Administered 2018-04-21: 2 g via INTRAVENOUS
  Filled 2018-04-21: qty 100

## 2018-04-21 MED ORDER — LACTATED RINGERS IV SOLN
INTRAVENOUS | Status: DC
  Administered 2018-04-21: 08:00:00 via INTRAVENOUS

## 2018-04-21 MED ORDER — 0.9 % SODIUM CHLORIDE (POUR BTL) OPTIME
TOPICAL | Status: DC | PRN
  Administered 2018-04-21: 1000 mL

## 2018-04-21 MED ORDER — SUGAMMADEX SODIUM 200 MG/2ML IV SOLN
INTRAVENOUS | Status: DC | PRN
  Administered 2018-04-21: 400 mg via INTRAVENOUS

## 2018-04-21 MED ORDER — FENTANYL CITRATE (PF) 250 MCG/5ML IJ SOLN
INTRAMUSCULAR | Status: AC
  Filled 2018-04-21: qty 5

## 2018-04-21 MED ORDER — ONDANSETRON HCL 4 MG/2ML IJ SOLN: 4.0000 mg | Freq: Once | INTRAMUSCULAR | Status: DC | PRN

## 2018-04-21 MED ORDER — DEXAMETHASONE SODIUM PHOSPHATE 10 MG/ML IJ SOLN
10.0000 mg | Freq: Once | INTRAMUSCULAR | Status: AC
  Administered 2018-04-21: 10 mg via INTRAVENOUS
  Filled 2018-04-21: qty 1

## 2018-04-21 MED ORDER — ROCURONIUM BROMIDE 50 MG/5ML IV SOSY
PREFILLED_SYRINGE | INTRAVENOUS | Status: AC
  Filled 2018-04-21: qty 5

## 2018-04-21 MED ORDER — MIDAZOLAM HCL 2 MG/2ML IJ SOLN
INTRAMUSCULAR | Status: AC
  Filled 2018-04-21: qty 2

## 2018-04-21 MED ORDER — HYDROCODONE-ACETAMINOPHEN 7.5-325 MG/15ML PO SOLN: 10.0000 mL | Freq: Four times a day (QID) | ORAL | 0 refills | Status: AC | PRN

## 2018-04-21 MED ORDER — OXYMETAZOLINE HCL 0.05 % NA SOLN
NASAL | Status: AC
  Filled 2018-04-21: qty 15

## 2018-04-21 MED ORDER — PROPOFOL 10 MG/ML IV BOLUS
INTRAVENOUS | Status: AC
  Filled 2018-04-21: qty 20

## 2018-04-21 SURGICAL SUPPLY — 23 items
BALLN PULM 15 16.5 18X75 (BALLOONS)
BALLOON PULM 15 16.5 18X75 (BALLOONS) IMPLANT
CANISTER SUCT 3000ML PPV (MISCELLANEOUS) ×3 IMPLANT
CATH ROBINSON RED A/P 18FR (CATHETERS) ×6 IMPLANT
COAGULATOR SUCT 8FR VV (MISCELLANEOUS) ×3 IMPLANT
CONT SPEC 4OZ CLIKSEAL STRL BL (MISCELLANEOUS) ×21 IMPLANT
COVER BACK TABLE 60X90IN (DRAPES) ×3 IMPLANT
COVER MAYO STAND STRL (DRAPES) ×3 IMPLANT
DRAPE HALF SHEET 40X57 (DRAPES) ×3 IMPLANT
GAUZE 4X4 16PLY RFD (DISPOSABLE) ×3 IMPLANT
GLOVE BIOGEL M 7.0 STRL (GLOVE) ×3 IMPLANT
GUARD TEETH (MISCELLANEOUS) ×3 IMPLANT
KIT BASIN OR (CUSTOM PROCEDURE TRAY) ×3 IMPLANT
KIT TURNOVER KIT B (KITS) ×3 IMPLANT
NEEDLE 18GX1X1/2 (RX/OR ONLY) (NEEDLE) ×3 IMPLANT
NEEDLE HYPO 25GX1X1/2 BEV (NEEDLE) IMPLANT
NS IRRIG 1000ML POUR BTL (IV SOLUTION) ×3 IMPLANT
PAD ARMBOARD 7.5X6 YLW CONV (MISCELLANEOUS) ×6 IMPLANT
PATTIES SURGICAL .5 X3 (DISPOSABLE) IMPLANT
SURGILUBE 2OZ TUBE FLIPTOP (MISCELLANEOUS) ×3 IMPLANT
TOWEL OR 17X24 6PK STRL BLUE (TOWEL DISPOSABLE) ×6 IMPLANT
TUBE CONNECTING 12X1/4 (SUCTIONS) ×3 IMPLANT
WATER STERILE IRR 1000ML POUR (IV SOLUTION) ×3 IMPLANT

## 2018-04-21 NOTE — Anesthesia Preprocedure Evaluation (Signed)
Anesthesia Evaluation  Patient identified by MRN, date of birth, ID band Patient awake    Reviewed: Allergy & Precautions, NPO status , Patient's Chart, lab work & pertinent test results  Airway Mallampati: II  TM Distance: >3 FB Neck ROM: Full    Dental no notable dental hx. (+) Poor Dentition, Missing,    Pulmonary neg pulmonary ROS,    Pulmonary exam normal breath sounds clear to auscultation       Cardiovascular negative cardio ROS Normal cardiovascular exam Rhythm:Regular Rate:Normal     Neuro/Psych PSYCHIATRIC DISORDERS Anxiety Depression Hx/o Bell's palsy  Neuromuscular disease    GI/Hepatic GERD  Controlled and Medicated,(+)     substance abuse  cocaine use and IV drug use, Hepatitis -, CLast alcohol and cocaine use 2014 Last IV drug use greater than 30 years ago   Endo/Other  diabetes, Well Controlled, Type 2, Oral Hypoglycemic AgentsHyperlipidemia  Renal/GU negative Renal ROS   ED BPH    Musculoskeletal  (+) Arthritis , Osteoarthritis,  Chronic low back pain S/P multiple back surgeries Right neck mass   Abdominal   Peds  Hematology   Anesthesia Other Findings   Reproductive/Obstetrics                             Anesthesia Physical Anesthesia Plan  ASA: III  Anesthesia Plan: General   Post-op Pain Management:    Induction: Intravenous  PONV Risk Score and Plan: 4 or greater and Ondansetron, Dexamethasone, Midazolam and Treatment may vary due to age or medical condition  Airway Management Planned: Oral ETT  Additional Equipment:   Intra-op Plan:   Post-operative Plan: Extubation in OR  Informed Consent: I have reviewed the patients History and Physical, chart, labs and discussed the procedure including the risks, benefits and alternatives for the proposed anesthesia with the patient or authorized representative who has indicated his/her understanding and  acceptance.   Dental advisory given  Plan Discussed with: CRNA and Surgeon  Anesthesia Plan Comments:         Anesthesia Quick Evaluation

## 2018-04-21 NOTE — Anesthesia Postprocedure Evaluation (Signed)
Anesthesia Post Note  Patient: [name]  Procedure(s) Performed: DIRECT LARYNGOSCOPY WITH BIOPSY OF TONGUE BASE AND NASOPHARYNX (N/A Mouth) RIGHT TONSILLECTOMY (Mouth)     Patient location during evaluation: PACU Anesthesia Type: General Level of consciousness: awake and alert Pain management: pain level controlled Vital Signs Assessment: post-procedure vital signs reviewed and stable Respiratory status: spontaneous breathing, nonlabored ventilation and respiratory function stable Cardiovascular status: blood pressure returned to baseline and stable Postop Assessment: no apparent nausea or vomiting Anesthetic complications: no    Last Vitals:  Vitals:   04/21/18 1100 04/21/18 1117  BP: 128/83 136/90  Pulse: 75 76  Resp: 12 16  Temp:    SpO2: 97% 96%    Last Pain:  Vitals:   04/21/18 1117  TempSrc:   PainSc: 4                  Shonette Rhames A.

## 2018-04-21 NOTE — Transfer of Care (Signed)
Immediate Anesthesia Transfer of Care Note  Patient: [name]  Procedure(s) Performed: DIRECT LARYNGOSCOPY WITH BIOPSY OF TONGUE BASE AND NASOPHARYNX (N/A Mouth) RIGHT TONSILLECTOMY (Mouth)  Patient Location: PACU  Anesthesia Type:General  Level of Consciousness: awake and alert   Airway & Oxygen Therapy: Patient Spontanous Breathing and Patient connected to nasal cannula oxygen  Post-op Assessment: Report given to RN and Post -op Vital signs reviewed and stable  Post vital signs: Reviewed and stable  Last Vitals:  Vitals Value Taken Time  BP 137/82 04/21/2018 10:21 AM  Temp 36.3 C 04/21/2018 10:21 AM  Pulse 85 04/21/2018 10:27 AM  Resp 19 04/21/2018 10:27 AM  SpO2 92 % 04/21/2018 10:27 AM  Vitals shown include unvalidated device data.  Last Pain:  Vitals:   04/21/18 0736  TempSrc:   PainSc: 3          Complications: No apparent anesthesia complications

## 2018-04-21 NOTE — H&P (Signed)
[name] is an 67 y.o. male.   Chief Complaint: Right Neck Mass HPI: The patient presents with a history of asymptomatic right neck mass.  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  PET scan performed and primary source for right neck metastasis unclear.  Evaluation including flexible laryngoscopy showed no evidence of mucosal lesion or ulcer.  Past Medical History:  Diagnosis Date  . Alcohol abuse   . Anxiety   . Arthritis   . Bell's palsy 05/2017  . BPH (benign prostatic hyperplasia)   . Chronic hepatitis C without hepatic coma ([place]) FOLLOWED BY INFECTOUS DISEASE DR COMER   POSITIVE ANTIBODY TEST THIS YEAR 2016--  CURRENT TX ON HARVONI PO- completeed 2016  . Chronic low back pain   . Collapsed lung    right .Marland Kitchen..15 yrs ago, fell and hit some bricks  . DDD (degenerative disc disease), lumbosacral   . Depression    no meds now- med used for bladder  . 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 ED (erectile dysfunction)   . Foley catheter in place    post lumbar surgery- cath left in throughh rehab- had 2 UTI's  . GERD (gastroesophageal reflux disease)    hx none in long time  . History of cocaine abuse    per pt quit and last used 06-03-2013  . Urinary retention     Past Surgical History:  Procedure Laterality Date  . APPENDECTOMY  1980's  . [place]  . CLOSED REDUCTION AND INTRAMAXILLARY FIXATION BILATERAL MENTAL FX'S/  PLACEMENT MAXILLARY STENT/ APERTURE WIRE PLACEMENT  01-04-2000  . JOINT REPLACEMENT Left    knee  . POSTERIOR LUMBAR FUSION  03-05-2015   laminectomy /  nerve decompression--  L4 -- S1  . TOTAL KNEE ARTHROPLASTY Left 09/21/2013   Procedure: LEFT TOTAL KNEE ARTHROPLASTY;  Surgeon: Yvette Rack., MD;  Location: [place];  Service: Orthopedics;  Laterality: Left;  . TRANSURETHRAL RESECTION OF PROSTATE N/A 06/16/2015   Procedure: TRANSURETHRAL  RESECTION OF THE PROSTATE WITH GYRUS INSTRUMENTS;  Surgeon: Franchot Gallo, MD;  Location: [facility];  Service: Urology;  Laterality: N/A;    History reviewed. No pertinent family history. Social History:  reports that he has never smoked. He has never used smokeless tobacco. He reports that he drank alcohol. He reports that he has current or past drug history.  Allergies: No Known Allergies  Medications Prior to Admission  Medication Sig Dispense Refill  . artificial tears (LACRILUBE) OINT ophthalmic ointment Place into both eyes every 3 (three) hours as needed for dry eyes. 7 g 0  . atorvastatin (LIPITOR) 20 MG tablet Take 20 mg by mouth daily.    . cyclobenzaprine (FLEXERIL) 10 MG tablet Take 10 mg by mouth at bedtime as needed for muscle spasms.    Marland Kitchen gabapentin (NEURONTIN) 300 MG capsule Take 300 mg by mouth 3 (three) times daily.    Marland Kitchen HYDROcodone-acetaminophen (NORCO/VICODIN) 5-325 MG tablet Take 1 tablet by mouth every 12 (twelve) hours as needed for pain.    Marland Kitchen ibuprofen (ADVIL,MOTRIN) 800 MG tablet Take 800 mg by mouth every 8 (eight) hours as needed for moderate pain.    . metFORMIN (GLUCOPHAGE) 1000 MG tablet Take 1,000 mg by mouth 2 (two) times daily with a meal.    . naproxen sodium (ALEVE) 220 MG tablet Take 220 mg by mouth 2 (two) times daily as needed (  knee pain).    . predniSONE (DELTASONE) 10 MG tablet Take 10 mg by mouth at bedtime.   0  . tamsulosin (FLOMAX) 0.4 MG CAPS capsule Take 1 capsule (0.4 mg total) by mouth daily. 14 capsule 0  . docusate sodium (COLACE) 100 MG capsule Take 1 capsule (100 mg total) by mouth 2 (two) times daily. (Patient not taking: Reported on 02/21/2018) 60 capsule 0  . DULoxetine (CYMBALTA) 60 MG capsule Take 1 capsule (60 mg total) by mouth daily. (Patient not taking: Reported on 02/21/2018) 30 capsule 12  . HYDROcodone-acetaminophen (NORCO/VICODIN) 5-325 MG tablet Take 1 tablet by mouth every 6 (six) hours as needed. (Patient not  taking: Reported on 02/21/2018) 10 tablet 0  . meclizine (ANTIVERT) 25 MG tablet Take 25 mg by mouth 2 (two) times daily as needed for dizziness.     . orphenadrine (NORFLEX) 100 MG tablet Take 1 tablet (100 mg total) by mouth 2 (two) times daily. (Patient not taking: Reported on 02/21/2018) 30 tablet 0    Results for orders placed or performed during the hospital encounter of 04/21/18 (from the past 48 hour(s))  Glucose, capillary     Status: None   Collection Time: 04/21/18  7:23 AM  Result Value Ref Range   Glucose-Capillary 92 70 - 99 mg/dL   Comment 1 Notify RN   CBC     Status: None   Collection Time: 04/21/18  7:42 AM  Result Value Ref Range   WBC 5.6 4.0 - 10.5 K/uL   RBC 4.86 4.22 - 5.81 MIL/uL   Hemoglobin 14.6 13.0 - 17.0 g/dL   HCT 44.4 39.0 - 52.0 %   MCV 91.4 78.0 - 100.0 fL   MCH 30.0 26.0 - 34.0 pg   MCHC 32.9 30.0 - 36.0 g/dL   RDW 13.5 11.5 - 15.5 %   Platelets 244 150 - 400 K/uL    Comment: Performed at [facility], [place] [address] [place], [place] [address]   No results found.  Review of Systems  Constitutional: Negative.   HENT: Negative.   Respiratory: Negative.   Cardiovascular: Negative.     Blood pressure 134/79, pulse 74, temperature 97.6 F (36.4 C), temperature source Oral, resp. rate 17, SpO2 100 %. Physical Exam  Constitutional: He appears well-developed and well-nourished.  Neck: Normal range of motion.  Cardiovascular: Normal rate.  Respiratory: Effort normal.  Lymphadenopathy:    He has cervical adenopathy.     Assessment/Plan Patient admitted for direct laryngoscopy, biopsy of the tongue base and tonsil region and biopsy of the nasopharynx as an outpatient under general anesthesia.  Yvonne Stopher, MD 04/21/2018, 8:56 AM

## 2018-04-21 NOTE — Op Note (Signed)
Operative Note: DIRECT LARYNGOSCOPY/BIOPSY   Patient: [name] record number: [id number]  Date:04/21/2018  Pre-operative Indications: Right neck metastatic squamous cell carcinoma  Postoperative Indications: Same  Surgical Procedure: 1. Direct Laryngoscopy with Biopsy     2. Nasopharyngoscopy with Biopsy    3. Right Tonsillectomy  Anesthesia: GET  Surgeon: Delsa Bern, M.D.  Assist: None  Complications: None  EBL: <50cc   Brief History: The patient is a 67 y.o. male with a history of right lateral neck mass.  Work-up included ultrasound guided needle biopsy which was consistent for metastatic squamous cell carcinoma.  The patient underwent a PET scan to identify possible primary source, PET scan negative for any additional abnormal activity.  Examination the office showed no evidence of mucosal mass or lesion. Given the patient's history and findings I recommended laryngoscopy with biopsy of the tongue base and tonsil region, nasopharyngoscopy with biopsy of the nasopharyngeal mucosa for identification of possible primary site for metastatic right neck disease under general anesthesia, risks and benefits were discussed in detail with the patient. They understand and agree with our plan for surgery which is scheduled at [facility] on an elective basis.  Surgical Procedure: The patient is brought to the operating room on 04/21/2018 and placed in supine position on the operating table. General endotracheal anesthesia was established without difficulty. When the patient was adequately anesthetized, surgical timeout was performed and correct identification of the patient and the surgical procedure. The patient was positioned and prepped and draped in sterile fashion.  A laryngoscope was used to examine the patient's oral cavity, oropharynx and larynx.  Findings include normal-appearing mucosa without evidence of mass, ulcer or lesion.  Bilateral tongue base  biopsy was taken.  There was no significant bleeding and the patient's airway was stable.  With the patient positioned for surgery a Phill Mutter mouth gag was inserted without difficulty, there were no loose or broken teeth and the hard soft palate were intact.  Right tonsillectomy was then performed, using Bovie cautery and dissecting in a subcapsular fashion the entire right tonsil was removed from superior pole to tongue base.  The tonsillar fossa was gently abraded with dry tonsil sponge and several small areas of point hemorrhage were cauterized with suction cautery. The Crowe-Davis mouth gag was released and reapplied there is no active bleeding. Oral cavity and nasopharynx were irrigated with saline.  Nasopharyngoscopy was then performed with direct visualization of the nasopharynx.  No evidence of mucosal ulcer or mass, four-quadrant were taken of the nasopharyngeal mucosa and underlying soft tissue.  These were sent to pathology for permanent evaluation.  An orogastric tube was passed and stomach contents were aspirated.  The patient's oral cavity oropharynx were irrigated and suctioned, no active bleeding.  Patient was awakened from anesthetic and transferred from the operating room to the recovery room in stable condition. There were no complications and blood loss was less than 50 cc.   Delsa Bern, M.D. [facility] 04/21/2018

## 2018-04-21 NOTE — Anesthesia Procedure Notes (Signed)
Procedure Name: Intubation Date/Time: 04/21/2018 9:27 AM Performed by: Valda Favia, CRNA Pre-anesthesia Checklist: Patient identified, Emergency Drugs available, Suction available and Patient being monitored Patient Re-evaluated:Patient Re-evaluated prior to induction Oxygen Delivery Method: Circle System Utilized Preoxygenation: Pre-oxygenation with 100% oxygen Induction Type: IV induction Ventilation: Mask ventilation without difficulty Laryngoscope Size: Mac and 4 Grade View: Grade I Tube type: Oral Tube size: 7.5 mm Number of attempts: 1 Airway Equipment and Method: Stylet and Oral airway Placement Confirmation: ETT inserted through vocal cords under direct vision,  positive ETCO2 and breath sounds checked- equal and bilateral Secured at: 22 cm Tube secured with: marked for ENT surgeon. Dental Injury: Teeth and Oropharynx as per pre-operative assessment

## 2018-04-21 NOTE — Progress Notes (Signed)
Pt. Reports he bumped his head 3 times at work on Wednesday, He has 3 small areas 1/2 inch long, scabbed over.

## 2018-04-22 ENCOUNTER — Encounter (HOSPITAL_COMMUNITY): Payer: Self-pay | Admitting: Otolaryngology

## 2018-04-29 ENCOUNTER — Emergency Department (HOSPITAL_COMMUNITY)
Admission: EM | Admit: 2018-04-29 | Discharge: 2018-04-29 | Disposition: A | Discharge status: Home / Self Care | Payer: Medicare HMO | Attending: Emergency Medicine | Admitting: Emergency Medicine

## 2018-04-29 ENCOUNTER — Other Ambulatory Visit: Payer: Self-pay

## 2018-04-29 DIAGNOSIS — E119 Type 2 diabetes mellitus without complications: Secondary | ICD-10-CM | POA: Insufficient documentation

## 2018-04-29 DIAGNOSIS — Z79899 Other long term (current) drug therapy: Secondary | ICD-10-CM | POA: Insufficient documentation

## 2018-04-29 DIAGNOSIS — G8918 Other acute postprocedural pain: Secondary | ICD-10-CM | POA: Insufficient documentation

## 2018-04-29 DIAGNOSIS — Z7984 Long term (current) use of oral hypoglycemic drugs: Secondary | ICD-10-CM | POA: Insufficient documentation

## 2018-04-29 MED ORDER — HYDROCODONE-ACETAMINOPHEN 5-325 MG PO TABS
1.0000 | ORAL_TABLET | Freq: Once | ORAL | Status: AC
  Administered 2018-04-29: 1 via ORAL
  Filled 2018-04-29: qty 1

## 2018-04-29 MED ORDER — IBUPROFEN 600 MG PO TABS: 600.0000 mg | ORAL_TABLET | Freq: Four times a day (QID) | ORAL | 0 refills | Status: DC | PRN

## 2018-04-29 NOTE — ED Triage Notes (Addendum)
Patient had tongue/cheek/tonsil biopsy one week ago and is not out of pain meds and c/o pain. Denies drainage or swelling, but c/o warmth. Started new abx yesterday.

## 2018-04-29 NOTE — Discharge Instructions (Addendum)
Return for facial/neck swelling, difficulty tolerating secretions, fever, vomiting or if develop worsening/new concerning symptoms you can return to the emergency department for re-evaluation.

## 2018-04-29 NOTE — ED Provider Notes (Signed)
[facility] EMERGENCY DEPARTMENT Provider Note   CSN: [id number] Arrival date & time: 04/29/18  2250     History   Chief Complaint Chief Complaint  Patient presents with  . Post-op Problem    HPI  [name] is a 67 y.o. male with a history of alcohol abuse, cocaine dependence, substance induced mood disorder who presents emergency department today for pain control.  Patient reports that he had a biopsy done of his tongue/cheek/tonsil 1 week ago by Dr. Wilburn Cornelia.  He reports that he was discharged home with hydrocodone that has ran out today.  He is requesting something for pain.  He notes his pain is over the areas of biopsy.  He denies any fever, N/V, facial swelling, neck swelling, difficulty swallowing, drainage.  He is currently on Augmentin suspension for his symptoms.  He has follow-up with Dr. Wilburn Cornelia on Monday.  He has not tried Tylenol or ibuprofen for his symptoms.  HPI  Past Medical History:  Diagnosis Date  . Alcohol abuse   . Anxiety   . Arthritis   . Bell's palsy 05/2017  . BPH (benign prostatic hyperplasia)   . Chronic hepatitis C without hepatic coma ([place]) FOLLOWED BY INFECTOUS DISEASE DR COMER   POSITIVE ANTIBODY TEST THIS YEAR 2016--  CURRENT TX ON HARVONI PO- completeed 2016  . Chronic low back pain   . Collapsed lung    right .Marland Kitchen..15 yrs ago, fell and hit some bricks  . DDD (degenerative disc disease), lumbosacral   . Depression    no meds now- med used for bladder  . 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 ED (erectile dysfunction)   . Foley catheter in place    post lumbar surgery- cath left in throughh rehab- had 2 UTI's  . GERD (gastroesophageal reflux disease)    hx none in long time  . History of cocaine abuse    per pt quit and last used 06-03-2013  . Urinary retention     Patient Active Problem List   Diagnosis Date Noted  . Neck mass 04/21/2018  . Liver fibrosis 04/05/2018  . Fusion of lumbar  spine 02/07/2017  . Constipation due to opioid therapy 01/27/2017  . Lumbar stenosis with neurogenic claudication 01/24/2017  . Enlarged prostate with urinary obstruction 06/16/2015  . Lumbar degenerative disc disease 03/05/2015  . Chronic hepatitis C without hepatic coma ([place]) 12/09/2014  . Chronic low back pain 01/17/2014  . Osteoarthritis of left knee 09/21/2013  . Alcohol dependence ([place]) 06/12/2013  . Cocaine dependence ([place]) 06/12/2013  . Substance induced mood disorder ([place]) 06/12/2013    Past Surgical History:  Procedure Laterality Date  . APPENDECTOMY  1980's  . [place]  . CLOSED REDUCTION AND INTRAMAXILLARY FIXATION BILATERAL MENTAL FX'S/  PLACEMENT MAXILLARY STENT/ APERTURE WIRE PLACEMENT  01-04-2000  . DIRECT LARYNGOSCOPY N/A 04/21/2018   Procedure: DIRECT LARYNGOSCOPY WITH BIOPSY OF TONGUE BASE AND NASOPHARYNX;  Surgeon: Jerrell Belfast, MD;  Location: [facility];  Service: ENT;  Laterality: N/A;  Direct laryngoscopy with biopsy of tongue base and nasopharynx  . JOINT REPLACEMENT Left    knee  . POSTERIOR LUMBAR FUSION  03-05-2015   laminectomy /  nerve decompression--  L4 -- S1  . TONSILLECTOMY  04/21/2018   Procedure: RIGHT TONSILLECTOMY;  Surgeon: Jerrell Belfast, MD;  Location: [facility];  Service: ENT;;  . TOTAL KNEE ARTHROPLASTY Left 09/21/2013   Procedure: LEFT TOTAL KNEE ARTHROPLASTY;  Surgeon: Yvette Rack., MD;  Location: [facility];  Service: Orthopedics;  Laterality: Left;  . TRANSURETHRAL RESECTION OF PROSTATE N/A 06/16/2015   Procedure: TRANSURETHRAL RESECTION OF THE PROSTATE WITH GYRUS INSTRUMENTS;  Surgeon: Franchot Gallo, MD;  Location[facility];  Service: Urology;  Laterality: N/A;        Home Medications    Prior to Admission medications   Medication Sig Start Date End Date Taking? Authorizing Provider  amoxicillin-clavulanate (AUGMENTIN) 250-62.5 MG/5ML suspension Take 10 mLs by mouth 2 (two) times daily for 10 days.  04/21/18 05/01/18  Jerrell Belfast, MD  artificial tears (LACRILUBE) OINT ophthalmic ointment Place into both eyes every 3 (three) hours as needed for dry eyes. 05/16/17   Virgel Manifold, MD  atorvastatin (LIPITOR) 20 MG tablet Take 20 mg by mouth daily.    [provider]  cyclobenzaprine (FLEXERIL) 10 MG tablet Take 10 mg by mouth at bedtime as needed for muscle spasms.    [provider]  docusate sodium (COLACE) 100 MG capsule Take 1 capsule (100 mg total) by mouth 2 (two) times daily. Patient not taking: Reported on 02/21/2018 01/26/17   Newman Pies, MD  DULoxetine (CYMBALTA) 60 MG capsule Take 1 capsule (60 mg total) by mouth daily. Patient not taking: Reported on 02/21/2018 01/17/14   Marcial Pacas, MD  gabapentin (NEURONTIN) 300 MG capsule Take 300 mg by mouth 3 (three) times daily.    [provider]  ibuprofen (ADVIL,MOTRIN) 800 MG tablet Take 800 mg by mouth every 8 (eight) hours as needed for moderate pain.    [provider]  meclizine (ANTIVERT) 25 MG tablet Take 25 mg by mouth 2 (two) times daily as needed for dizziness.     [provider]  metFORMIN (GLUCOPHAGE) 1000 MG tablet Take 1,000 mg by mouth 2 (two) times daily with a meal.    [provider]  naproxen sodium (ALEVE) 220 MG tablet Take 220 mg by mouth 2 (two) times daily as needed (knee pain).    [provider]  orphenadrine (NORFLEX) 100 MG tablet Take 1 tablet (100 mg total) by mouth 2 (two) times daily. Patient not taking: Reported on 9/39/0300 04/03/32   Delora Fuel, MD  predniSONE (DELTASONE) 10 MG tablet Take 10 mg by mouth at bedtime.  02/03/18   [provider]  tamsulosin (FLOMAX) 0.4 MG CAPS capsule Take 1 capsule (0.4 mg total) by mouth daily. 02/01/17   Duffy Bruce, MD    Family History No family history on file.  Social History Social History   Tobacco Use  . Smoking status: Never Smoker  . Smokeless tobacco: Never Used  Substance  Use Topics  . Alcohol use: Not Currently    Comment: occ  . Drug use: Not Currently    Comment: per pt quit and last used crack 06-03-2013     Allergies   Patient has no known allergies.   Review of Systems Review of Systems  Constitutional: Negative for fever.  HENT: Negative for drooling, facial swelling, trouble swallowing and voice change.   Gastrointestinal: Negative for nausea and vomiting.  Musculoskeletal: Negative for neck pain and neck stiffness.     Physical Exam Updated Vital Signs BP (!) 155/87 (BP Location: Right Arm)   Pulse 91   Temp 97.6 F (36.4 C) (Oral)   Resp 16   Ht 6\' 1"  (1.854 m)   Wt 101.6 kg   SpO2 99%   BMI 29.55 kg/m   Physical  Exam  Constitutional: He appears well-developed and well-nourished.  HENT:  Head: Normocephalic and atraumatic.  Right Ear: Tympanic membrane, external ear and ear canal normal.  Left Ear: Tympanic membrane, external ear and ear canal normal.  Nose: Nose normal.  Mouth/Throat: Uvula is midline, oropharynx is clear and moist and mucous membranes are normal. No tonsillar exudate.  The patient has normal phonation and is in control of secretions. No stridor.  Midline uvula without edema. Soft palate rises symmetrically.  No tonsillar erythema or exudates. No PTA. Tongue protrusion is normal. No noted areas of swelling, erythema, drainage or lesions of the tongue. No floor edema. No trismus. No creptius on neck palpation. No gingival erythema or fluctuance noted. Mucus membranes moist.   Eyes: Conjunctivae are normal. Right eye exhibits no discharge. Left eye exhibits no discharge. No scleral icterus.  Neck:  No nuchal rigidity or meningismus. No crepitus. No edema or swelling. No pain on palpation.   Pulmonary/Chest: Effort normal. No respiratory distress.  Neurological: He is alert.  Skin: No pallor.  Psychiatric: He has a normal mood and affect.  Nursing note and vitals reviewed.    ED Treatments / Results   Labs (all labs ordered are listed, but only abnormal results are displayed) Labs Reviewed - No data to display  EKG None  Radiology No results found.  Procedures Procedures (including critical care time)  Medications Ordered in ED Medications  HYDROcodone-acetaminophen (NORCO/VICODIN) 5-325 MG per tablet 1 tablet (1 tablet Oral Given 04/29/18 2347)     Initial Impression / Assessment and Plan / ED Course  I have reviewed the triage vital signs and the nursing notes.  Pertinent labs & imaging results that were available during my care of the patient were reviewed by me and considered in my medical decision making (see chart for details).     67 y.o. male with recent bx by ENT. Reports his hydrocodone has run out today and is requesting refill. He is currently on abx.  There is no areas of infection noted & no concern for spread of infection at this time.  He is in control of his secretions and has normal phonation.  No trismus.  No lesions, fluctuance, drainage or skin changes noted.  No crepitus on neck palpation.  No facial or neck edema.  No meningeal signs.  Exam overall reassuring.  Discussed with him we can control his pain in the department but I am not able to discharge home with pain medication.  He states understanding.  He will follow-up with his ENT doctor on Monday as scheduled.  Recommended ibuprofen as needed for pain.  His last creatinine was within normal limits this month.  No history of GI bleed.  He does have a history of alcohol dependence.  Will recommend avoiding Tylenol.  Return precautions were discussed.  Patient appears safe for discharge.  Final Clinical Impressions(s) / ED Diagnoses   Final diagnoses:  Post-operative pain    ED Discharge Orders         Ordered    ibuprofen (ADVIL,MOTRIN) 600 MG tablet  Every 6 hours PRN     04/29/18 2352           Lorelle Gibbs 04/29/18 2352    Pattricia Boss, MD 05/01/18 [phone]

## 2018-05-08 ENCOUNTER — Telehealth: Payer: Self-pay | Admitting: *Deleted

## 2018-05-08 ENCOUNTER — Encounter: Payer: Self-pay | Admitting: Radiation Oncology

## 2018-05-08 ENCOUNTER — Telehealth: Payer: Self-pay | Admitting: Radiation Oncology

## 2018-05-08 NOTE — Telephone Encounter (Addendum)
Oncology Nurse Navigator Documentation  Placed introductory call to new referral patient Mr. Seabrook.  Introduced myself as the [facility] H&N oncology nurse navigator where he has been referred by ENT Dr. Wilburn Cornelia to see [place] and [place].        He confirmed understanding of referral, he accepted appt to see Dr. Isidore Moos, RadOnc, this Friday 10/11 0730/0800, voiced understanding MedOnc appt is pending.  Briefly explained my role as his navigator, indicated I would be joining him during appt.  I explained the purpose of a dental evaluation prior to starting RT, indicated he wd be contacted by [facility] to arrange an appt within a day or so of his appt with Dr. Isidore Moos.   Confirmed understanding of [place] location, explained arrival and RadOnc registration process.  Provided my contact information, encouraged him/her to call with questions/concerns before next week.  He verbalized understanding of information provided, expressed appreciation for my call.  Navigator Needs Assessment . Employment status:  Working PT, hours somewhat flexible. . Support system:  Living with friends at this time, has family in [place]. . Transportation:  Relies on public transportation/medical transport. Marland Kitchen PCP:  Correctly listed as Care Team member.  She is not aware of confirmed dx. Marland Kitchen PCD:  Up until 3 weeks ago, did not see dentist.  Began seeing DDS Sheppard Penton re fitting for dentures.  He noted he has 7 teeth (3 front lower, 2 upper molars R&L) which she does not plan to extract.  Navigator Interventions  Firefighter notified.  [place] notified for pre-radiotherapy dental evaluation.  Gayleen Orem, RN, BSN Head & Neck Oncology Nurse [place] at [place] [phone]

## 2018-05-08 NOTE — Progress Notes (Signed)
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 Tongue, biopsy, right base - BENIGN SQUAMOUS MUCOSA WITH NO SPECIFIC HISTOPATHOLOGIC CHANGES. - NO EVIDENCE OF MALIGNANCY. 2. Tongue, biopsy, left base - BENIGN SQUAMOUS MUCOSA WITH NO SPECIFIC HISTOPATHOLOGIC CHANGES. - NO EVIDENCE OF MALIGNANCY. 3. Nasopharynx, biopsy, left superior - BENIGN SINONASAL MUCOSA. - NO EVIDENCE OF MALIGNANCY. 4. Nasopharynx, biopsy, right superior - BENIGN SINONASAL MUCOSA. - NO EVIDENCE OF MALIGNANCY. 5. Nasopharynx, biopsy, left inferior - BENIGN SINONASAL MUCOSA. - NO EVIDENCE OF MALIGNANCY. 6. Nasopharynx, biopsy, right inferior - BENIGN SINONASAL MUCOSA. - NO EVIDENCE OF MALIGNANCY. 7. Tonsil, right - TONSIL WITH NO SPECIFIC HISTOPATHOLOGIC CHANGES. - NO EVIDENCE OF MALIGNANCY.  Patient presented months ago with symptoms of: He presented to the ED on 02/21/18 with a 3 week history of swelling to his right neck.   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   Nutrition Status Yes No Comments  Weight changes? [x]  []  He has lost a few lbs since symptoms appeared  Swallowing concerns? [x]  []  He has some pain when swallowing after his biopsy. He is eating softer foods, food will get stuck at times in his throat.   PEG? []  [x]     Referrals Yes No Comments  Social Work? []  [x]    Dentistry? [x]  []  Dr. Enrique Sack 05/11/18  Swallowing therapy? []  [x]    Nutrition? []  [x]    Med/Onc? []  [x]     Safety Issues Yes No Comments  Prior radiation? []  [x]    Pacemaker/ICD? []  [x]    Possible current pregnancy? []  [x]    Is the patient on methotrexate? []  [x]     Tobacco/Marijuana/Snuff/ETOH use: He has a history of crack cocaine abuse quitting in 2014 for a while. He then quit again in 04/2017. He does not drink alcohol or smoke cigarettes.    Past/Anticipated interventions by otolaryngology, if any:  04/21/18 Surgical Procedure:    1. Direct Laryngoscopy with Biopsy                                        2. Nasopharyngoscopy with Biopsy                                     3. Right Tonsillectomy Surgeon: Delsa Bern, M.D.  Past/Anticipated interventions by medical oncology, if any:  None scheduled.    Current Complaints / other details:   He reports pain to his right face, ear area. He is taking ibuprofen/ tylenol. He has run out of hydrocodone provided by Dr. Wilburn Cornelia  BP (!) 144/85 (BP Location: Left Arm, Patient Position: Sitting)   Pulse 81   Temp 98 F (36.7 C) (Oral)   Resp 18   Ht 6\' 1"  (1.854 m)   Wt 221 lb 6 oz (100.4 kg)   SpO2 99%   BMI 29.21 kg/m    Wt Readings from Last 3 Encounters:  05/12/18 221 lb 6 oz (100.4 kg)  05/10/18 219 lb 9.6 oz (99.6 kg)  04/29/18 224 lb (101.6 kg)

## 2018-05-08 NOTE — Telephone Encounter (Signed)
Spoke with patient regarding the transportation after a referral from [facility]. Patient expressed a need for transportation and I have gotten him set up with our program. He is scheduled for a ride to his appointments on 10/11.

## 2018-05-10 ENCOUNTER — Telehealth: Payer: Self-pay | Admitting: *Deleted

## 2018-05-10 ENCOUNTER — Encounter: Payer: Self-pay | Admitting: Hematology

## 2018-05-10 ENCOUNTER — Inpatient Hospital Stay: Payer: Medicare HMO | Attending: Hematology | Admitting: Hematology

## 2018-05-10 ENCOUNTER — Encounter: Payer: Self-pay | Admitting: *Deleted

## 2018-05-10 ENCOUNTER — Telehealth: Payer: Self-pay | Admitting: Hematology

## 2018-05-10 ENCOUNTER — Other Ambulatory Visit: Payer: Self-pay

## 2018-05-10 VITALS — BP 139/80 | HR 76 | Temp 97.8°F | Resp 18 | Ht 73.0 in | Wt 219.6 lb

## 2018-05-10 DIAGNOSIS — C770 Secondary and unspecified malignant neoplasm of lymph nodes of head, face and neck: Secondary | ICD-10-CM | POA: Insufficient documentation

## 2018-05-10 DIAGNOSIS — M4802 Spinal stenosis, cervical region: Secondary | ICD-10-CM

## 2018-05-10 DIAGNOSIS — Z79899 Other long term (current) drug therapy: Secondary | ICD-10-CM

## 2018-05-10 DIAGNOSIS — Z7984 Long term (current) use of oral hypoglycemic drugs: Secondary | ICD-10-CM | POA: Diagnosis not present

## 2018-05-10 DIAGNOSIS — C801 Malignant (primary) neoplasm, unspecified: Secondary | ICD-10-CM

## 2018-05-10 DIAGNOSIS — E119 Type 2 diabetes mellitus without complications: Secondary | ICD-10-CM

## 2018-05-10 DIAGNOSIS — C7989 Secondary malignant neoplasm of other specified sites: Secondary | ICD-10-CM | POA: Insufficient documentation

## 2018-05-10 DIAGNOSIS — F1421 Cocaine dependence, in remission: Secondary | ICD-10-CM

## 2018-05-10 DIAGNOSIS — F1021 Alcohol dependence, in remission: Secondary | ICD-10-CM

## 2018-05-10 NOTE — Telephone Encounter (Signed)
Oncology Nurse Navigator Documentation  Spoke with [facility], requested per Dr. Maylon Peppers EBV and p16 stains for 03/09/18 bx.  She voiced understanding.  Gayleen Orem, RN, BSN Head & Neck Oncology Nurse [place] at [place] [phone]

## 2018-05-10 NOTE — Progress Notes (Addendum)
[facility] NOTE  Patient Care Team: Eston Esters, NP as PCP - General (Family Medicine) Newman Pies, MD as Consulting Physician (Neurosurgery)  BRIEF ONCOLOGIC SUMMARY: 1. 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 -Given isolated R cervical LN disease, upfront surgical resection, followed by adjuvant RT or chemorRT as indicated based on the final pathology, likely offer the best long-term disease control and chance of cure.  -We will discuss the case at the H&N tumor board next week regarding the best approach in this case. -I have requested EBV and HPV stain to be performed on the cervical lymph node biopsy -If the patient is deemed not a surgical candidate, then he will likely require chemoradiation due to the positive LN disease.  -I discussed at length with patient regarding the rationale and some of the potential side effects from chemoradiation in the definitive and adjuvant setting. -If he requires chemoradiation, the following will need to be arranged in preparation for treatment:  - Referral to Radiation Oncology for consultation (currently scheduled on 05/12/2018)  - Referral to dentist for full dental evaluation and possible dental extraction   - Referral to Speech Pathologist    - Referral to Nutritionist   - Referral to Social Worker  - Once the patient completes dental evaluation, he will require feeding tube and port placement prior to starting treatment   - Chemotherapy class to learn about practical tips while on treatment  Right neck pain -Secondary to recent tonsillectomy -Patient requested refill from ENT but was instructed to contact [facility] [place] -I discussed w/ the patient that as the next treatment step was not yet determined, I would prescribe him 2 weeks of tramadol for pain control until we can determine whether he would proceed with surgery or chemoradiation -He understands that if he proceeds with surgery, then his pain management will be under the ENT guideline -Tramadol q6hrs PRN x 2 weeks E-scribed to his pharmacy  Hx of substance abuse, including EtOH and cocaine  -Patient has history of alcohol and cocaine abuse. -We discussed about the role of pain management if the patient proceeds with chemoradiation.  -The prescribed pain regimen is for short term use only, and would be usually tapered off by 3-4 months after completion of chemoradiation. -Furthermore, patient understands that he cannot use illicit substance or receive prescriptions from multiple providers.   Severe cervical spinal stenosis -CT neck in July 2019 showed severe canal stenosis at C3-4; s/p C4-5 ACDF -Neurologic exam intact; no sign or symptom of cord compression -Patient already has an established neurosurgeon because of previous cervical spinal surgeries; I encouraged the patient to contact his neurosurgeon for a follow-up visit to review the recent CT neck images and to determine if any additional work-up is indicated   Type II DM -On metformin; currently well controlled -I emphasized with the patient about the importance of having  PCP to manage his other comorbidities, especially if he receives radiation or chemotherapy, as the treatment can exacerbate his chronic medical  conditions, such as diabetes   All questions were answered. The patient knows to call the clinic with any problems, questions or concerns.  A total of more than 60 minutes were spent face-to-face with the patient during this encounter and over half of that time was spent on counseling and coordination of care as outlined above.   No return to clinic appt scheduled today, pending discussion at the head and neck tumor board on 05/17/2018. H&N navigator to coordinate follow-up based on that discussion.    Tish Men, MD 05/10/18 10:23 AM   CHIEF COMPLAINTS/PURPOSE OF CONSULTATION:  "I am here for my neck cancer "  HISTORY OF PRESENTING ILLNESS:  [name] 67 y.o. male is here because of newly diagnosed squamous cell carcinoma of the right cervical lymph node. According to the patient, the first initial presentation was due to right neck pain and swelling, for which he presented to the emergency department in late July 2019.  At that time, CT neck showed enlarged level 2 cervical lymph node measuring 2.5 x 2.3 cm, concerning for malignancy.  He subsequently underwent right cervical lymph node biopsy, which showed squamous cell carcinoma. The patient reports intermittent, sharp, nonradiating, pain in the right jaw area, associated with odynophagia and 10 pound weight loss over the past several weeks due to pain with eating.  He uses boost 1-2 times per day, in addition to maintaining oral hydration. Patient was seen by ENT and underwent direct laryngoscopy with target of the biopsies of nasopharynx, tongue, and right tonsillectomy, which did not reveal any primary site of disease. He underwent PET scan that showed isolated hypermetabolic right level 2 lymph node disease, but did not show any evidence of primary head and neck or metastatic disease.  I have reviewed his chart and materials related to his cancer extensively and collaborated history with the patient. 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 No identified primary within the head or neck. Recommend PET-CT and/or histopathologic correlation.    02/21/2018 Miscellaneous    Presented to ED with 3 weeks of right neck swelling    03/09/2018 Pathology Results    Right cervical LN biopsy: (Accession: [id number]) Metastatic squamous cell carcinoma     03/31/2018 Imaging    PET: 1. Moderately hypermetabolic RIGHT level 2 lymph node corresponds to biopsied lymph node (squamous cell carcinoma). 2. No evidence of primary head neck carcinoma identified. 3. No evidence of distant metastatic disease.    04/21/2018 Surgery    (At [facility] by Dr. Wilburn Cornelia) 1. Direct Laryngoscopy with Biopsy    2. Nasopharyngoscopy with Biopsy 3. 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 Alcohol abuse   . Anxiety   . Arthritis   . Bell's palsy 05/2017  . BPH (benign prostatic hyperplasia)   . Chronic hepatitis C without hepatic  coma ([place]) FOLLOWED BY INFECTOUS DISEASE DR COMER   POSITIVE ANTIBODY TEST THIS YEAR 2016--  CURRENT TX ON HARVONI PO- completeed 2016  . Chronic low back pain   . Collapsed lung    right .Marland Kitchen..15 yrs ago, fell and hit some bricks  . DDD (degenerative disc disease), lumbosacral   . Depression    no meds now- med used for bladder  . 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 ED (erectile dysfunction)   . Foley catheter in place    post lumbar surgery- cath left in  throughh rehab- had 2 UTI's  . GERD (gastroesophageal reflux disease)    hx none in long time  . History of cocaine abuse ([place])    per pt quit and last used 06-03-2013  . Urinary retention     SURGICAL HISTORY: Past Surgical History:  Procedure Laterality Date  . APPENDECTOMY  1980's  . [place]  . CLOSED REDUCTION AND INTRAMAXILLARY FIXATION BILATERAL MENTAL FX'S/  PLACEMENT MAXILLARY STENT/ APERTURE WIRE PLACEMENT  01-04-2000  . DIRECT LARYNGOSCOPY N/A 04/21/2018   Procedure: DIRECT LARYNGOSCOPY WITH BIOPSY OF TONGUE BASE AND NASOPHARYNX;  Surgeon: Jerrell Belfast, MD;  Location: [facility];  Service: ENT;  Laterality: N/A;  Direct laryngoscopy with biopsy of tongue base and nasopharynx  . JOINT REPLACEMENT Left    knee  . POSTERIOR LUMBAR FUSION  03-05-2015   laminectomy /  nerve decompression--  L4 -- S1  . TONSILLECTOMY  04/21/2018   Procedure: RIGHT TONSILLECTOMY;  Surgeon: Jerrell Belfast, MD;  Location: [facility];  Service: ENT;;  . TOTAL KNEE ARTHROPLASTY Left 09/21/2013   Procedure: LEFT TOTAL KNEE ARTHROPLASTY;  Surgeon: Yvette Rack., MD;  Location: [facility];  Service: Orthopedics;  Laterality: Left;  . TRANSURETHRAL RESECTION OF PROSTATE N/A 06/16/2015   Procedure: TRANSURETHRAL RESECTION OF THE PROSTATE WITH GYRUS INSTRUMENTS;  Surgeon: Franchot Gallo, MD;  Location: [facility];  Service: Urology;  Laterality: N/A;    SOCIAL HISTORY: Social History   Socioeconomic History  . Marital status: Divorced    Spouse name: Not on file  . Number of children: Not on file  . Years of education: Not on file  . Highest education level: Not on file  Occupational History  . Not on file  Social Needs  . Financial resource strain: Not on file  . Food insecurity:    Worry: Not on file    Inability: Not on file  . Transportation needs:    Medical: Not on file    Non-medical: Not on file  Tobacco Use  . Smoking status: Never Smoker  . Smokeless  tobacco: Never Used  Substance and Sexual Activity  . Alcohol use: Not Currently    Comment: occ  . Drug use: Not Currently    Comment: per pt quit and last used crack 06-03-2013  . Sexual activity: Not on file    Comment: declined condoms  Lifestyle  . Physical activity:    Days per week: Not on file    Minutes per session: Not on file  . Stress: Not on file  Relationships  . Social connections:    Talks on phone: Not on file    Gets together: Not on file    Attends religious service: Not on file    Active member of club or organization: Not on file    Attends meetings of clubs or organizations: Not on file  Relationship status: Not on file  . Intimate partner violence:    Fear of current or ex partner: Not on file    Emotionally abused: Not on file    Physically abused: Not on file    Forced sexual activity: Not on file  Other Topics Concern  . Not on file  Social History Narrative  . Not on file    FAMILY HISTORY: History reviewed. No pertinent family history.  ALLERGIES:   has No Known Allergies.  MEDICATIONS:  Current Outpatient Medications  Medication Sig Dispense Refill  . artificial tears (LACRILUBE) OINT ophthalmic ointment Place into both eyes every 3 (three) hours as needed for dry eyes. 7 g 0  . atorvastatin (LIPITOR) 20 MG tablet Take 20 mg by mouth daily.    . cyclobenzaprine (FLEXERIL) 10 MG tablet Take 10 mg by mouth at bedtime as needed for muscle spasms.    Marland Kitchen gabapentin (NEURONTIN) 300 MG capsule Take 300 mg by mouth 3 (three) times daily.    Marland Kitchen ibuprofen (ADVIL,MOTRIN) 600 MG tablet Take 1 tablet (600 mg total) by mouth every 6 (six) hours as needed. 16 tablet 0  . metFORMIN (GLUCOPHAGE) 1000 MG tablet Take 1,000 mg by mouth 2 (two) times daily with a meal.    . naproxen sodium (ALEVE) 220 MG tablet Take 220 mg by mouth 2 (two) times daily as needed (knee pain).    . predniSONE (DELTASONE) 10 MG tablet Take 10 mg by mouth at bedtime.   0  .  tamsulosin (FLOMAX) 0.4 MG CAPS capsule Take 1 capsule (0.4 mg total) by mouth daily. 14 capsule 0  . docusate sodium (COLACE) 100 MG capsule Take 1 capsule (100 mg total) by mouth 2 (two) times daily. (Patient not taking: Reported on 02/21/2018) 60 capsule 0  . DULoxetine (CYMBALTA) 60 MG capsule Take 1 capsule (60 mg total) by mouth daily. (Patient not taking: Reported on 02/21/2018) 30 capsule 12  . meclizine (ANTIVERT) 25 MG tablet Take 25 mg by mouth 2 (two) times daily as needed for dizziness.     . orphenadrine (NORFLEX) 100 MG tablet Take 1 tablet (100 mg total) by mouth 2 (two) times daily. (Patient not taking: Reported on 02/21/2018) 30 tablet 0   No current facility-administered medications for this visit.     REVIEW OF SYSTEMS:   Constitutional: ( - ) fevers, ( - )  chills , ( - ) night sweats Eyes: ( - ) blurriness of vision, ( - ) double vision, ( - ) watery eyes Ears, nose, mouth, throat, and face: ( - ) mucositis, ( +) sore throat Respiratory: ( - ) cough, ( - ) dyspnea, ( - ) wheezes Cardiovascular: ( - ) palpitation, ( - ) chest discomfort, ( - ) lower extremity swelling Gastrointestinal:  ( - ) nausea, ( - ) heartburn, ( - ) change in bowel habits Skin: ( - ) abnormal skin rashes Lymphatics: ( - ) new lymphadenopathy, ( - ) easy bruising Neurological: ( - ) numbness, ( - ) tingling, ( - ) new weaknesses Behavioral/Psych: ( - ) mood change, ( - ) new changes  All other systems were reviewed with the patient and are negative.  PHYSICAL EXAMINATION: ECOG PERFORMANCE STATUS: 1 - Symptomatic but completely ambulatory  Vitals:   05/10/18 0906  BP: 139/80  Pulse: 76  Resp: 18  Temp: 97.8 F (36.6 C)  SpO2: 98%   Filed Weights   05/10/18 0906  Weight: 219 lb 9.6 oz (  99.6 kg)    GENERAL: alert, no distress and comfortable SKIN: skin color, texture, turgor are normal, no rashes or significant lesions EYES: conjunctiva are pink and non-injected, sclera clear OROPHARYNX:  no exudate, no erythema; lips, buccal mucosa, and tongue normal  NECK: supple LYMPH:  ~2.5cm tender mobile R Level II cervical LN palpated; no other cervical or axillary lymphadenopathy LUNGS: clear to auscultation and percussion with normal breathing effort HEART: regular rate & rhythm and no murmurs and no lower extremity edema ABDOMEN: soft, non-tender, non-distended, normal bowel sounds Musculoskeletal: no cyanosis of digits and no clubbing  PSYCH: alert & oriented x 3, fluent speech NEURO: no focal motor/sensory deficits  LABORATORY DATA:  I have reviewed the data as listed Lab Results  Component Value Date   WBC 5.6 04/21/2018   HGB 14.6 04/21/2018   HCT 44.4 04/21/2018   MCV 91.4 04/21/2018   PLT 244 04/21/2018   Lab Results  Component Value Date   NA 140 04/17/2018   K 5.0 04/17/2018   CL 106 04/17/2018   CO2 27 04/17/2018    RADIOGRAPHIC STUDIES: I have personally reviewed the radiological images as listed and agreed with the findings in the report.  PATHOLOGY: I have reviewed the pathology reports as listed above.

## 2018-05-10 NOTE — Telephone Encounter (Signed)
No return to clinic appointment yet, pending tumor board discussion per 10/9 los

## 2018-05-10 NOTE — Progress Notes (Signed)
Oncology Nurse Navigator Documentation  Met with Mr. Mcgue during initial consult with Dr. Maylon Peppers. He was unaccompanied. . Further introduced myself as his Navigator, explained my role as a member of the Care Team.  He voiced understanding:  Of discussion re tmt options  His case will be presented at next week's H&N TB meeting for consensus recommendation.  Importance of pre-radiotherapy dental evaluation  I explained I will join him during appt with Dr. Isidore Moos on Friday.  He met with [place] after this appt.  I encouraged him to repeat calls to ENT [facility] re analgesic Rx for R neck pain.  (I LVMM late yesterday afternoon on his behalf.)  Gayleen Orem, RN, BSN Head & Neck Oncology Nurse [place] at [place] [phone]

## 2018-05-11 ENCOUNTER — Ambulatory Visit (HOSPITAL_COMMUNITY): Payer: Medicare HMO | Admitting: Dentistry

## 2018-05-11 ENCOUNTER — Encounter (HOSPITAL_COMMUNITY): Payer: Self-pay | Admitting: Dentistry

## 2018-05-11 ENCOUNTER — Telehealth: Payer: Self-pay | Admitting: *Deleted

## 2018-05-11 ENCOUNTER — Other Ambulatory Visit: Payer: Self-pay | Admitting: Hematology

## 2018-05-11 ENCOUNTER — Encounter: Payer: Self-pay | Admitting: Radiation Oncology

## 2018-05-11 VITALS — BP 134/90 | HR 75 | Temp 98.3°F

## 2018-05-11 DIAGNOSIS — K036 Deposits [accretions] on teeth: Secondary | ICD-10-CM

## 2018-05-11 DIAGNOSIS — K08409 Partial loss of teeth, unspecified cause, unspecified class: Secondary | ICD-10-CM

## 2018-05-11 DIAGNOSIS — K06010 Localized gingival recession, unspecified: Secondary | ICD-10-CM

## 2018-05-11 DIAGNOSIS — Z01818 Encounter for other preprocedural examination: Secondary | ICD-10-CM

## 2018-05-11 DIAGNOSIS — K0530 Chronic periodontitis, unspecified: Secondary | ICD-10-CM

## 2018-05-11 DIAGNOSIS — C7989 Secondary malignant neoplasm of other specified sites: Secondary | ICD-10-CM

## 2018-05-11 DIAGNOSIS — C801 Malignant (primary) neoplasm, unspecified: Principal | ICD-10-CM

## 2018-05-11 DIAGNOSIS — M264 Malocclusion, unspecified: Secondary | ICD-10-CM

## 2018-05-11 DIAGNOSIS — K0889 Other specified disorders of teeth and supporting structures: Secondary | ICD-10-CM

## 2018-05-11 MED ORDER — TRAMADOL HCL 50 MG PO TABS: 50.0000 mg | ORAL_TABLET | Freq: Four times a day (QID) | ORAL | 0 refills | Status: AC | PRN

## 2018-05-11 MED ORDER — SODIUM FLUORIDE 1.1 % DT CREA: TOPICAL_CREAM | DENTAL | 99 refills | Status: DC

## 2018-05-11 NOTE — Progress Notes (Addendum)
Radiation Oncology         [phone] ________________________________  Initial Outpatient Consultation  Name: [name] MRN: [id number]  Date: 05/12/2018  DOB: [birth year]  [id number], Jeremy Layman, NP  Jerrell Belfast, MD   REFERRING PHYSICIAN: Jerrell Belfast, MD  DIAGNOSIS:    ICD-10-CM   1. 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 Secondary malignancy of lymph nodes of head, face and neck (HCC) C77.0 Amb Referral to Nutrition and Diabetic E    Ambulatory referral to Physical Therapy    Referral to Neuro Rehab    Ambulatory referral to Social Work    TSH    T4, free  3. Rectal bleeding K62.5 Ambulatory referral to Gastroenterology  4. Screening for hypothyroidism Z13.29 TSH    T4, free  5. Loss of weight R63.4 TSH    T4, free   Head and neck Cancer, unknown primary, Estimated stage: TXN2M0  CHIEF COMPLAINT: Here to discuss management of head and neck cancer  HISTORY OF PRESENT ILLNESS::[name] is a 67 y.o. male who presented with swelling to the right side of his neck. He notes the lymph node went from the "size of his pinky" to the "size of a baseball within 48 hours." His PCP prescribed antibiotics with no improvement. CT performed in the ED on 02/21/18 revealed a right level 2a 2.3 x 2.5 cm pathologic nodal conglomeration highly concerning for metastatic disease, with no identified primary within the head or neck.  Subsequently, the patient saw Dr. Wilburn Cornelia on 02/22/18 who ordered a biopsy. The biopsy of a right cervical lymph node on 03/09/18 revealed: metastatic squamous cell carcinoma. HPV, EBV testing ordered by Dr Maylon Peppers.  The patient underwent laryngoscopy with biopsy of tongue base and nasopharynx with right tonsillectomy on 04/21/18. None of the needle core samples revealed a primary disease.  Pertinent imaging thus far includes PET scan  performed on 03/31/18 revealing moderately hypermetabolic right level 2 lymph node corresponding to biopsied lymph node, with no evidence of primary head/neck carcinoma or distant metastatic disease. I have personally reviewed his imaging. Enlarged lymph node in right neck appears greater than 3 cm when measured superiorly to inferiorly (approximately 3.3 cm).  Swallowing issues, if any: He states some pain when swallowing since his biopsy. He is eating softer foods, food will get stuck at times in his throat  Weight Changes: He reports losing a few lbs since symptoms appeared.  Pain status: He reports pain to his right face/ear area  Other symptoms: migraines that come and go with his ear pain, rectal bleeding streaking BMs Upper abdominal midline pain x 92yr  Tobacco history, if any: none  ETOH abuse, if any: heavy drinking in the past, not for at least one year.  Reports history of crack cocaine smoking, sober for >82yr.  Prior cancers, if any: none  PREVIOUS RADIATION THERAPY: No  PAST MEDICAL HISTORY:  has a past medical history of Alcohol abuse, Arthritis, Bell's palsy (05/2017), BPH (benign prostatic hyperplasia), Chronic hepatitis C without hepatic coma (HCC) (FOLLOWED BY INFECTOUS DISEASE DR COMER), Chronic low back pain, Collapsed lung, DDD (degenerative disc disease), lumbosacral, Depression, Diabetes mellitus without complication ([place]), ED (erectile dysfunction), Foley catheter in place, GERD (gastroesophageal reflux disease), History of cocaine abuse ([place]), and Urinary retention.    PAST SURGICAL HISTORY: Past Surgical History:  Procedure Laterality Date  . APPENDECTOMY  1980's  .  [place]  . CLOSED REDUCTION AND INTRAMAXILLARY FIXATION BILATERAL MENTAL FX'S/  PLACEMENT MAXILLARY STENT/ APERTURE WIRE PLACEMENT  01-04-2000  . DIRECT LARYNGOSCOPY N/A 04/21/2018   Procedure: DIRECT LARYNGOSCOPY WITH BIOPSY OF TONGUE BASE AND NASOPHARYNX;  Surgeon: Jerrell Belfast, MD;  Location: [place];  Service: ENT;  Laterality: N/A;  Direct laryngoscopy with biopsy of tongue base and nasopharynx  . JOINT REPLACEMENT Left    knee  . POSTERIOR LUMBAR FUSION  03-05-2015   laminectomy /  nerve decompression--  L4 -- S1  . TONSILLECTOMY  04/21/2018   Procedure: RIGHT TONSILLECTOMY;  Surgeon: Jerrell Belfast, MD;  Location: [place];  Service: ENT;;  . TOTAL KNEE ARTHROPLASTY Left 09/21/2013   Procedure: LEFT TOTAL KNEE ARTHROPLASTY;  Surgeon: Yvette Rack., MD;  Location: [place];  Service: Orthopedics;  Laterality: Left;  . TRANSURETHRAL RESECTION OF PROSTATE N/A 06/16/2015   Procedure: TRANSURETHRAL RESECTION OF THE PROSTATE WITH GYRUS INSTRUMENTS;  Surgeon: Franchot Gallo, MD;  [facility];  Service: Urology;  Laterality: N/A;    FAMILY HISTORY:  Does not report similar cancers in family  SOCIAL HISTORY:  reports that he has never smoked. He has never used smokeless tobacco. He reports that he drank alcohol. He reports that he has current or past drug history. Drug: "Crack" cocaine.  ALLERGIES: Patient has no known allergies.  MEDICATIONS:  Current Outpatient Medications  Medication Sig Dispense Refill  . cyclobenzaprine (FLEXERIL) 10 MG tablet Take 10 mg by mouth at bedtime as needed for muscle spasms.    Marland Kitchen ibuprofen (ADVIL,MOTRIN) 600 MG tablet Take 1 tablet (600 mg total) by mouth every 6 (six) hours as needed. 16 tablet 0  . metFORMIN (GLUCOPHAGE) 1000 MG tablet Take 1,000 mg by mouth 2 (two) times daily with a meal.    . naproxen sodium (ALEVE) 220 MG tablet Take 220 mg by mouth 2 (two) times daily as needed (knee pain).    . sodium fluoride (PREVIDENT 5000 PLUS) 1.1 % CREA dental cream Apply cream to tooth brush. Brush teeth for 2 minutes. Spit out excess-DO NOT swallow. Repeat nightly. 1 Tube prn  . artificial tears (LACRILUBE) OINT ophthalmic ointment Place into both eyes every 3 (three) hours as needed for dry eyes. (Patient  not taking: Reported on 05/12/2018) 7 g 0  . atorvastatin (LIPITOR) 20 MG tablet Take 20 mg by mouth daily.    Marland Kitchen docusate sodium (COLACE) 100 MG capsule Take 1 capsule (100 mg total) by mouth 2 (two) times daily. (Patient not taking: Reported on 02/21/2018) 60 capsule 0  . DULoxetine (CYMBALTA) 60 MG capsule Take 1 capsule (60 mg total) by mouth daily. (Patient not taking: Reported on 05/12/2018) 30 capsule 12  . gabapentin (NEURONTIN) 300 MG capsule Take 300 mg by mouth 3 (three) times daily.    Marland Kitchen HYDROcodone-acetaminophen (HYCET) 7.5-325 mg/15 ml solution     . HYDROcodone-acetaminophen (NORCO/VICODIN) 5-325 MG tablet   0  . meclizine (ANTIVERT) 25 MG tablet Take 25 mg by mouth 2 (two) times daily as needed for dizziness.     . orphenadrine (NORFLEX) 100 MG tablet Take 1 tablet (100 mg total) by mouth 2 (two) times daily. (Patient not taking: Reported on 02/21/2018) 30 tablet 0  . tamsulosin (FLOMAX) 0.4 MG CAPS capsule Take 1 capsule (0.4 mg total) by mouth daily. (Patient not taking: Reported on 05/12/2018) 14 capsule 0  . traMADol (ULTRAM) 50 MG tablet Take 1 tablet (50 mg total)  by mouth every 6 (six) hours as needed for up to 14 days. (Patient not taking: Reported on 05/12/2018) 56 tablet 0   No current facility-administered medications for this encounter.     REVIEW OF SYSTEMS:  A 10+ POINT REVIEW OF SYSTEMS WAS OBTAINED including neurology, dermatology, psychiatry, cardiac, respiratory, lymph, extremities, GI, GU, Musculoskeletal, constitutional, breasts, reproductive, HEENT.  All pertinent positives are noted in the HPI.  All others are negative.   PHYSICAL EXAM:  height is 6\' 1"  (1.854 m) and weight is 221 lb 6 oz (100.4 kg). His oral temperature is 98 F (36.7 C). His blood pressure is 144/85 (abnormal) and his pulse is 81. His respiration is 18 and oxygen saturation is 99%.   General: Alert and oriented, in no acute distress HEENT: Head is normocephalic. Extraocular movements are  intact. Oropharynx is notable for healing from recent biopsies, but no masses appreciated. Missing most teeth Neck: Neck is notable for fairly subtle mass, approximately 3 cm, in the right level 2 region, anterior to the SCM muscle. No other masses appreciated. Heart: Regular in rate and rhythm with no murmurs, rubs, or gallops. Chest: Clear to auscultation bilaterally, with no rhonchi, wheezes, or rales. Abdomen: Soft, nontender, nondistended, with no rigidity or guarding. Tenderness to palpation in the upper midline of his abdomen, and states this has been present for about a year. Extremities: No cyanosis or edema. Lymphatics: see Neck Exam Skin: No concerning lesions. Musculoskeletal: symmetric strength and muscle tone throughout. Neurologic: Cranial nerves II through XII are grossly intact. No obvious focalities. Speech is fluent. Coordination is intact. Psychiatric: Judgment and insight are intact. Affect is appropriate.   ECOG = 0  0 - Asymptomatic (Fully active, able to carry on all predisease activities without restriction)  1 - Symptomatic but completely ambulatory (Restricted in physically strenuous activity but ambulatory and able to carry out work of a light or sedentary nature. For example, light housework, office work)  2 - Symptomatic, <50% in bed during the day (Ambulatory and capable of all self care but unable to carry out any work activities. Up and about more than 50% of waking hours)  3 - Symptomatic, >50% in bed, but not bedbound (Capable of only limited self-care, confined to bed or chair 50% or more of waking hours)  4 - Bedbound (Completely disabled. Cannot carry on any self-care. Totally confined to bed or chair)  5 - Death   Eustace Pen MM, Creech RH, Tormey DC, et al. [id number]). "Toxicity and response criteria of the [facility]". [place] Oncol. 5 (6): 649-55   LABORATORY DATA:  Lab Results  Component Value Date   WBC 5.6 04/21/2018    HGB 14.6 04/21/2018   HCT 44.4 04/21/2018   MCV 91.4 04/21/2018   PLT 244 04/21/2018   CMP     Component Value Date/Time   NA 140 04/17/2018 0806   K 5.0 04/17/2018 0806   CL 106 04/17/2018 0806   CO2 27 04/17/2018 0806   GLUCOSE 101 (H) 04/17/2018 0806   BUN 14 04/17/2018 0806   CREATININE 0.93 04/17/2018 0806   CREATININE 1.04 01/26/2016 1025   CALCIUM 9.7 04/17/2018 0806   PROT 7.4 04/17/2018 0806   ALBUMIN 3.9 04/17/2018 0806   AST 23 04/17/2018 0806   ALT 18 04/17/2018 0806   ALKPHOS 64 04/17/2018 0806   BILITOT 0.5 04/17/2018 0806   GFRNONAA >60 04/17/2018 0806   GFRNONAA 76 01/26/2016 1025   GFRAA >60 04/17/2018 4742  GFRAA 88 01/26/2016 1025      No results found for: TSH   RADIOGRAPHY: as above    IMPRESSION/PLAN: Head and Neck Squamous cell carcinoma of Unknown Primary  This is a delightful patient with head and neck cancer. As discussed with Dr. Maylon Peppers, it is reasonable for the patient to be considered for neck dissection followed by radiotherapy. This may help spare him from chemotherapy, though he understands that chemotherapy may still be necessary if surgery reveals more advanced disease than anticipated, such as extracapsular extension. While most panel members of the NCCN guidelines recommend up-front neck dissection, an alternative would be radiotherapy definitively or chemoradiotherapy. Whether radiotherapy is given up front or adjuvantly, I explained that it would take place over 6-7 weeks, and I would treat the bilateral neck and the oropharynx. We are still waiting for HPV and EBV testing.  We discussed the potential risks, benefits, and side effects of radiotherapy. We talked in detail about acute and late effects. We discussed that some of the most bothersome acute effects may be mucositis, dysgeusia, salivary changes, skin irritation, hair loss, dehydration, weight loss and fatigue. We talked about late effects which include but are not necessarily limited  to dysphagia, hypothyroidism, nerve injury, spinal cord injury, xerostomia, trismus, and neck edema. No guarantees of treatment were given. A consent form was signed and placed in the patient's medical record. The patient is enthusiastic about proceeding with treatment. I look forward to participating in the patient's care.    We also discussed that the treatment of head and neck cancer is a multidisciplinary process to maximize treatment outcomes and quality of life. For this reason the following referrals have been or will be made:  Medical oncology to discuss chemotherapy. Patient saw Dr. Maylon Peppers already.  Dentistry for dental evaluation, possible extractions in the radiation fields, and /or advice on reducing risk of cavities, osteoradionecrosis, or other oral issues. Patient saw Dr. Enrique Sack yesterday, 05/11/18 and has been cleared for radiation therapy. He may have dentures made in the future.  Nutritionist for nutrition support during and after treatment.  Speech language pathology for swallowing and/or speech therapy.  Social work for social support.   Physical therapy due to risk of lymphedema in neck and deconditioning.  Patient reports blood-streaked bowel movements and denies having a colonoscopy in the past. I have ordered this for him for the near future; I did explain that colon cancer is more common in patient with a history of head and neck caner.  I left a voicemail for Dr. Wilburn Cornelia regarding the patient to verify is he can perform a neck dissection up front. I will also send him an email.   __________________________________________   Eppie Gibson, MD  This document serves as a record of services personally performed by Eppie Gibson, MD. It was created on his behalf by Wilburn Mylar, a trained medical scribe. The creation of this record is based on the scribe's personal observations and the provider's statements to them. This document has been checked and approved by the  attending provider.

## 2018-05-11 NOTE — Telephone Encounter (Signed)
Oncology Nurse Navigator Documentation  LVMM for pt with Dental Medicine and his phone indicating tramadol Rx has been sent to his preferred pharmacy.  Gayleen Orem, RN, BSN Head & Neck Oncology Nurse [place] at [place] [phone]

## 2018-05-11 NOTE — Addendum Note (Signed)
Addended by: Tish Men on: 05/11/2018 09:13 AM   Modules accepted: Orders

## 2018-05-11 NOTE — Patient Instructions (Signed)

## 2018-05-11 NOTE — Telephone Encounter (Signed)
Oncology Nurse Navigator Documentation  Rec'd call from pt upon his arrival for 9:00 appt with Dental Medicine. He rec'd call from ENT Dr. Victorio Palm office yesterday late afternoon indicating because he  is now being seen at [facility], Dr. Wilburn Cornelia asks that physician team manages his pain.  [name] went on to say his ear/R side of head is extremely painful, to the point he cannot wear his glasses.   Dr. Maylon Peppers notified per patient's consult with him yesterday.  Gayleen Orem, RN, BSN Head & Neck Oncology Nurse [place] at [place] [phone]

## 2018-05-11 NOTE — Progress Notes (Signed)
DENTAL CONSULTATION  Date of Consultation:  05/11/2018 Patient Name:   [name] Date of Birth:   02/14/1951 Medical Record Number: [id number]  VITALS: BP 134/90 (BP Location: Right Arm)   Pulse 75   Temp 98.3 F (36.8 C)   CHIEF COMPLAINT: Patient is referred by Dr. Isidore Moos for a dental consultation.  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 Patient with possible neck surgery, radiation therapy, and chemotherapy as indicted. The patient is now seen as part of a pre-chemoradiation therapy dental protocol examination.  The patient currently denies acute toothaches, swellings, or abscesses. Patient has recently been seen by her dentist, Dr. Vale Haven, for an exam, radiographs, periodontal therapy, and extraction of tooth #24 on 04/26/2018. The patient also had impressions for the fabrication of upper lower acrylic partial dentures.  Prior to that, the patient had been seen by [facility] in 2014 for fabrication of upper and lower acrylic partial dentures. These partial dentures "never fit well" by patient report.  The patient indicates he has a history of a mandible fracture in the 1990s. Patient indicates he was treated by an oral surgeon on [employer] and had his "jaws wired together".  Patient indicates that he has had a malocclusion ever since then. The patient denies having dental phobia.  PROBLEM LIST: Patient Active Problem List   Diagnosis Date Noted  . 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 Cervical stenosis of spinal canal 05/10/2018  . Neck mass 04/21/2018  . Liver fibrosis 04/05/2018  . Fusion of lumbar spine 02/07/2017  . Constipation due to opioid therapy 01/27/2017  . Lumbar stenosis with neurogenic claudication 01/24/2017  . Enlarged prostate with urinary obstruction 06/16/2015  . Lumbar degenerative disc disease  03/05/2015  . Chronic hepatitis C without hepatic coma ([place]) 12/09/2014  . Chronic low back pain 01/17/2014  . Osteoarthritis of left knee 09/21/2013  . Alcohol dependence ([place]) 06/12/2013  . Cocaine dependence ([place]) 06/12/2013  . Substance induced mood disorder ([place]) 06/12/2013    PMH: Past Medical History:  Diagnosis Date  . Alcohol abuse   . Anxiety   . Arthritis   . Bell's palsy 05/2017  . BPH (benign prostatic hyperplasia)   . Chronic hepatitis C without hepatic coma ([place]) FOLLOWED BY INFECTOUS DISEASE DR COMER   POSITIVE ANTIBODY TEST THIS YEAR 2016--  CURRENT TX ON [facility] PO- completeed 2016  . Chronic low back pain   . Collapsed lung    right .Marland Kitchen..15 yrs ago, fell and hit some bricks  . DDD (degenerative disc disease), lumbosacral   . Depression    no meds now- med used for bladder  . 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 ED (erectile dysfunction)   . Foley catheter in place    post lumbar surgery- cath left in throughh rehab- had 2 UTI's  . GERD (gastroesophageal reflux disease)    hx none in long time  . History of cocaine abuse ([place])    per pt quit and last used 06-03-2013  . Urinary retention     PSH: Past Surgical History:  Procedure Laterality Date  . APPENDECTOMY  1980's  . [place]  . CLOSED REDUCTION AND INTRAMAXILLARY FIXATION BILATERAL MENTAL FX'S/  PLACEMENT MAXILLARY STENT/ APERTURE WIRE PLACEMENT  01-04-2000  . DIRECT LARYNGOSCOPY N/A 04/21/2018  Procedure: DIRECT LARYNGOSCOPY WITH BIOPSY OF TONGUE BASE AND NASOPHARYNX;  Surgeon: Jerrell Belfast, MD;  Location: [facility];  Service: ENT;  Laterality: N/A;  Direct laryngoscopy with biopsy of tongue base and nasopharynx  . JOINT REPLACEMENT Left    knee  . POSTERIOR LUMBAR FUSION  03-05-2015   laminectomy /  nerve decompression--  L4 -- S1  . TONSILLECTOMY  04/21/2018   Procedure: RIGHT TONSILLECTOMY;  Surgeon: Jerrell Belfast, MD;  Location: [facility];  Service:  ENT;;  . TOTAL KNEE ARTHROPLASTY Left 09/21/2013   Procedure: LEFT TOTAL KNEE ARTHROPLASTY;  Surgeon: Yvette Rack., MD;  Location: [facility];  Service: Orthopedics;  Laterality: Left;  . TRANSURETHRAL RESECTION OF PROSTATE N/A 06/16/2015   Procedure: TRANSURETHRAL RESECTION OF THE PROSTATE WITH GYRUS INSTRUMENTS;  Surgeon: Franchot Gallo, MD;  Location: [facility];  Service: Urology;  Laterality: N/A;    ALLERGIES: No Known Allergies  MEDICATIONS: Current Outpatient Medications  Medication Sig Dispense Refill  . artificial tears (LACRILUBE) OINT ophthalmic ointment Place into both eyes every 3 (three) hours as needed for dry eyes. 7 g 0  . atorvastatin (LIPITOR) 20 MG tablet Take 20 mg by mouth daily.    . cyclobenzaprine (FLEXERIL) 10 MG tablet Take 10 mg by mouth at bedtime as needed for muscle spasms.    Marland Kitchen docusate sodium (COLACE) 100 MG capsule Take 1 capsule (100 mg total) by mouth 2 (two) times daily. (Patient not taking: Reported on 02/21/2018) 60 capsule 0  . DULoxetine (CYMBALTA) 60 MG capsule Take 1 capsule (60 mg total) by mouth daily. (Patient not taking: Reported on 02/21/2018) 30 capsule 12  . gabapentin (NEURONTIN) 300 MG capsule Take 300 mg by mouth 3 (three) times daily.    Marland Kitchen ibuprofen (ADVIL,MOTRIN) 600 MG tablet Take 1 tablet (600 mg total) by mouth every 6 (six) hours as needed. 16 tablet 0  . meclizine (ANTIVERT) 25 MG tablet Take 25 mg by mouth 2 (two) times daily as needed for dizziness.     . metFORMIN (GLUCOPHAGE) 1000 MG tablet Take 1,000 mg by mouth 2 (two) times daily with a meal.    . naproxen sodium (ALEVE) 220 MG tablet Take 220 mg by mouth 2 (two) times daily as needed (knee pain).    Marland Kitchen orphenadrine (NORFLEX) 100 MG tablet Take 1 tablet (100 mg total) by mouth 2 (two) times daily. (Patient not taking: Reported on 02/21/2018) 30 tablet 0  . predniSONE (DELTASONE) 10 MG tablet Take 10 mg by mouth at bedtime.   0  . tamsulosin (FLOMAX) 0.4 MG CAPS  capsule Take 1 capsule (0.4 mg total) by mouth daily. 14 capsule 0  . traMADol (ULTRAM) 50 MG tablet Take 1 tablet (50 mg total) by mouth every 6 (six) hours as needed for up to 14 days. 56 tablet 0   No current facility-administered medications for this visit.     LABS: Lab Results  Component Value Date   WBC 5.6 04/21/2018   HGB 14.6 04/21/2018   HCT 44.4 04/21/2018   MCV 91.4 04/21/2018   PLT 244 04/21/2018      Component Value Date/Time   NA 140 04/17/2018 0806   K 5.0 04/17/2018 0806   CL 106 04/17/2018 0806   CO2 27 04/17/2018 0806   GLUCOSE 101 (H) 04/17/2018 0806   BUN 14 04/17/2018 0806   CREATININE 0.93 04/17/2018 0806   CREATININE 1.04 01/26/2016 1025   CALCIUM 9.7 04/17/2018 0806   GFRNONAA >60 04/17/2018  9833   GFRNONAA 76 01/26/2016 1025   GFRAA >60 04/17/2018 0806   GFRAA 88 01/26/2016 1025   Lab Results  Component Value Date   INR 1.04 03/09/2018   INR 1.05 11/12/2014   INR 1.08 09/13/2013   No results found for: PTT  SOCIAL HISTORY: Social History   Socioeconomic History  . Marital status: Divorced    Spouse name: Not on file  . Number of children: Not on file  . Years of education: Not on file  . Highest education level: Not on file  Occupational History  . Not on file  Social Needs  . Financial resource strain: Not on file  . Food insecurity:    Worry: Not on file    Inability: Not on file  . Transportation needs:    Medical: Not on file    Non-medical: Not on file  Tobacco Use  . Smoking status: Never Smoker  . Smokeless tobacco: Never Used  Substance and Sexual Activity  . Alcohol use: Not Currently    Comment: occ  . Drug use: Not Currently    Comment: per pt quit and last used crack 06-03-2013  . Sexual activity: Not on file    Comment: declined condoms  Lifestyle  . Physical activity:    Days per week: Not on file    Minutes per session: Not on file  . Stress: Not on file  Relationships  . Social connections:    Talks  on phone: Not on file    Gets together: Not on file    Attends religious service: Not on file    Active member of club or organization: Not on file    Attends meetings of clubs or organizations: Not on file    Relationship status: Not on file  . Intimate partner violence:    Fear of current or ex partner: Not on file    Emotionally abused: Not on file    Physically abused: Not on file    Forced sexual activity: Not on file  Other Topics Concern  . Not on file  Social History Narrative  . Not on file    FAMILY HISTORY: History reviewed. No pertinent family history.  REVIEW OF SYSTEMS: Reviewed with the patient as per History of present illness. Psych: Patient denies having dental phobia.  DENTAL HISTORY: CHIEF COMPLAINT: Patient is referred by Dr. Isidore Moos for a dental consultation.  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 Patient with possible neck surgery, radiation therapy, and chemotherapy as indicted. The patient is now seen as part of a pre-chemoradiation therapy dental protocol examination.  The patient currently denies acute toothaches, swellings, or abscesses. Patient has recently been seen by her dentist, Dr. Vale Haven, for an exam, radiographs, periodontal therapy, and extraction of tooth #24 on 04/26/2018. The patient also had impressions for the fabrication of upper lower acrylic partial dentures.  Prior to that, the patient had been seen by [facility] in 2014 for fabrication of upper and lower acrylic partial dentures. These partial dentures "never fit well" by patient report.  The patient indicates he has a history of a mandible fracture in the 1990s. Patient indicates he was treated by an oral surgeon on [employer] and had his "jaws wired together".  Patient indicates that he has had a malocclusion ever since then. The patient denies having dental  phobia.  DENTAL EXAMINATION: GENERAL:  The patient is a well-developed, well-nourished male in no acute distress. HEAD AND NECK:  The patient has right neck lymphadenopathy. There is no left neck lymphadenopathy palpated. Patient denies acute TMJ symptoms. Maximum interincisal opening is measured at 40 mm. INTRAORAL EXAM:  The patient has normal saliva. There is no evidence of oral abscess formation. The area of tooth number 24 is consistent with recent extraction on 04/26/2018. DENTITION:  Patient will multiple missing teeth with exception of tooth numbers 4, 11, 12, 25, 26, 27, and 28. There is atrophy of the edentulous alveolar ridges. PERIODONTAL:the patient has chronic periodontitis with minimal plaque accumulations, gingival recession, and incipient tooth mobility as charted. There is incipient to moderate bone loss noted. DENTAL CARIES/SUBOPTIMAL RESTORATIONS: No dental caries are noted. ENDODONTIC:  The patient denies symptoms of acute pulpitis. There is no evidence of periapical pathology or radiolucency. CROWN AND BRIDGE:  There are no crown or bridge restorations. PROSTHODONTIC: The patient is currently having upper and lower acrylic partial dentures fabricated by Dr. Vale Haven. OCCLUSION:  The patient has a poor occlusal scheme secondary to multiple missing teeth, supra-eruption and drifting of the unopposed teeth into the edentulous areas, and lack of replacement of missing teeth with dental prostheses. Patient also appears to have an acquired class III malocclusion.  RADIOGRAPHIC INTERPRETATION: An orthopantogram was taken today. There are multiple missing teeth. Tooth numbers 4, 11, 12, and 25-28 are present.There is moderate bone loss noted. There is atrophy of the edentulous alveolar ridges. There is pneumatization of the bilateral maxillary sinuses.   ASSESSMENTS: 1. Squamous cell carcinoma of the right neck with an unknown primary tumor 2. Pre-chemoradiation therapy dental  protocol 3. Multiple missing teeth 4. Supra-eruption and drifting of the unopposed teeth into the edentulous areas 5. Chronic periodontitis with bone loss 6. Accretions 7. Gingival recession 8. Mandibular anterior incipient tooth mobility 9. Malocclusion  PLAN/RECOMMENDATIONS: 1. I discussed the risks, benefits, and complications of various treatment options with the patient in relationship to his medical and dental conditions, anticipated chemoradiation therapy, chemotherapy ration therapy side effects to include xerostomia, radiation caries, trismus, mucositis, taste changes, gum and jawbone changes, and risk for infection and osteoradion. We discussed various treatment options to include no treatment, extraction of teeth in the primary field of radiation therapy, pre-prosthetic surgery as indicated, periodontal therapy, dental restorations, root canal therapy, crown and bridge therapy, implant therapy, and replacement of missing teeth as indicated. The anticipated radiation therapy ports and doses should not involve any of the teeth present in the mouth at this time.  This has been confirmed with Dr. Isidore Moos after review of the anticipated ports drawing. Patient will require fluoride therapy. Prescription for PreviDent 5000 will be sent to his pharmacy with refills for one year.  I discussed proceeding with fabrication of upper and lower acrylic partial dentures by Dr. Gilford Rile at this time. The patient should be able to have this procedure continued as long as proper adjustments are made for the patient if denture irritation is noted. Patient is aware that partial dentures may not be able to be worn during the delivery of chemoradiation therapy if mucositis is present.  Patient expresses understanding and will keep the dentures out of his mouth if significant mucositis present. Patient also indicates he will contact Dr. Gilford Rile if denture adjustments are needed.     2. Discussion of findings with  medical team and coordination of future medical and dental care as needed.  I spent in excess of  120  minutes during the conduct of this consultation and >50% of this time involved direct face-to-face encounter for counseling and/or coordination of the patient's care.    Lenn Cal, DDS

## 2018-05-12 ENCOUNTER — Other Ambulatory Visit: Payer: Self-pay

## 2018-05-12 ENCOUNTER — Encounter: Payer: Self-pay | Admitting: *Deleted

## 2018-05-12 ENCOUNTER — Encounter: Payer: Self-pay | Admitting: Radiation Oncology

## 2018-05-12 ENCOUNTER — Ambulatory Visit
Admission: RE | Admit: 2018-05-12 | Discharge: 2018-05-12 | Disposition: A | Discharge status: Home / Self Care | Payer: Medicare HMO | Source: Ambulatory Visit | Attending: Radiation Oncology | Admitting: Radiation Oncology

## 2018-05-12 ENCOUNTER — Telehealth: Payer: Self-pay | Admitting: Radiation Oncology

## 2018-05-12 VITALS — BP 144/85 | HR 81 | Temp 98.0°F | Resp 18 | Ht 73.0 in | Wt 221.4 lb

## 2018-05-12 DIAGNOSIS — C770 Secondary and unspecified malignant neoplasm of lymph nodes of head, face and neck: Secondary | ICD-10-CM

## 2018-05-12 DIAGNOSIS — K625 Hemorrhage of anus and rectum: Secondary | ICD-10-CM

## 2018-05-12 DIAGNOSIS — C7989 Secondary malignant neoplasm of other specified sites: Secondary | ICD-10-CM

## 2018-05-12 DIAGNOSIS — C801 Malignant (primary) neoplasm, unspecified: Principal | ICD-10-CM

## 2018-05-12 DIAGNOSIS — R634 Abnormal weight loss: Secondary | ICD-10-CM

## 2018-05-12 DIAGNOSIS — Z1329 Encounter for screening for other suspected endocrine disorder: Secondary | ICD-10-CM

## 2018-05-12 NOTE — Progress Notes (Signed)
Oncology Nurse Navigator Documentation  Met with Mr. Dinkins during initial consult with Dr. Isidore Moos.  He was unaccompanied. . Provided introductory explanation of radiation treatment including SIM planning and purpose of Aquaplast head and shoulder mask, showed him example.    He voiced understanding of Dr. Pearlie Oyster recommendation of neck dissection followed by adjuvant RT, understanding she will communicate further with ENT Dr. Wilburn Cornelia to establish plan. I encouraged him to contact me with questions/concerns.  Gayleen Orem, RN, BSN Head & Neck Oncology Nurse [place] at [place] [phone]

## 2018-05-12 NOTE — Telephone Encounter (Signed)
Scheduled appt per 10/11 sch message for Nutritionist - pt is aware of appt date and time.

## 2018-05-18 ENCOUNTER — Other Ambulatory Visit: Payer: Self-pay | Admitting: Otolaryngology

## 2018-05-19 ENCOUNTER — Telehealth: Payer: Self-pay | Admitting: *Deleted

## 2018-05-19 ENCOUNTER — Encounter: Payer: Self-pay | Admitting: General Practice

## 2018-05-19 NOTE — Telephone Encounter (Signed)
Oncology Nurse Navigator Documentation  Received call from pt indicating his neck dissection with ENT Dr. Wilburn Cornelia is scheduled for 10/30 11:45. Drs. Isidore Moos and Maylon Peppers updated.  Gayleen Orem, RN, BSN Head & Neck Oncology Nurse [place] [facility] at [place] [phone]

## 2018-05-19 NOTE — Progress Notes (Signed)
[facility] Psychosocial Distress Screening Clinical Social Work  Clinical Social Work was referred by distress screening protocol.  The patient scored a 9 on the Psychosocial Distress Thermometer which indicates severe distress. Clinical Social Worker contacted patient by phone to assess for distress and other psychosocial needs. Unable to reach patient, left VM requesting call back to discuss options for support available at [facility].    ONCBCN DISTRESS SCREENING 05/12/2018  Screening Type Initial Screening  Distress experienced in past week (1-10) 9  Practical problem type Housing  Family Problem type Children  Emotional problem type Adjusting to illness;Isolation/feeling alone;Adjusting to appearance changes  Spiritual/Religous concerns type Relating to God  Information Concerns Type Lack of info about treatment  Physical Problem type Pain;Sleep/insomnia    Clinical Social Worker follow up needed: No.  If yes, follow up plan:  Beverely Pace, [place], LCSW Clinical Social Worker Phone:  [phone]

## 2018-05-22 ENCOUNTER — Telehealth: Payer: Self-pay | Admitting: Radiation Oncology

## 2018-05-22 NOTE — Telephone Encounter (Signed)
Patient called and wanted to see if I could transport him to his surgery next week. I told him that we can only transport to appointments here at the cancer center and will set him up for a ride on 11/8.   He is really concerned about where he is going after his surgery next week. He is thinking that he is going to go to a rehabilitation place after his surgery. Messaged Gayleen Orem regarding this because he seemed sort of overwhelmed and confused for his next steps after surgery.   He kept telling me to wait to schedule his ride for his nutrition appointment on 11/8 because he doesn't know the address that he'll be picked up from.

## 2018-05-24 ENCOUNTER — Telehealth: Payer: Self-pay | Admitting: *Deleted

## 2018-05-24 ENCOUNTER — Other Ambulatory Visit: Payer: Self-pay | Admitting: Radiation Oncology

## 2018-05-24 DIAGNOSIS — Q7649 Other congenital malformations of spine, not associated with scoliosis: Secondary | ICD-10-CM

## 2018-05-24 NOTE — Telephone Encounter (Signed)
Oncology Nurse Navigator Documentation  Mr. Shackett called to inform per Dr. Newman Pies' office, Neurology, he needs referral to follow-up re findings of 02/21/18 CT Neck per mention during 10/10 consult with Dr. Isidore Moos because he hasn't been seen in over a year.  Dr. Isidore Moos informed.  Gayleen Orem, RN, BSN Head & Neck Oncology Nurse [place] at [place] [phone]

## 2018-05-25 ENCOUNTER — Telehealth: Payer: Self-pay | Admitting: *Deleted

## 2018-05-25 NOTE — Telephone Encounter (Signed)
CALLED PATIENT TO INFORM OF APPT. WITH MANSOURATY ON 05-29-18 - ARRIVAL TIME - 3:45 PM , ADDRESS - [address]. NO. - [phone], SPOKE WITH PATIENT AND HE IS AWARE OF THIS APPT.

## 2018-05-26 ENCOUNTER — Telehealth: Payer: Self-pay | Admitting: *Deleted

## 2018-05-26 NOTE — Telephone Encounter (Signed)
Oncology Nurse Navigator Documentation  Spoke with Mr. Hiemstra, reminded him of Monday's 8:00 pre-admission testing for [facility] neck surgery, 4:00 appt with [facility].  Informed him referral has been placed to Dr. Adline Mango office.  He voiced understanding.  Gayleen Orem, RN, BSN Head & Neck Oncology Nurse [place] at [place] [phone]

## 2018-05-26 NOTE — Pre-Procedure Instructions (Signed)
[name]  05/26/2018      [facility], [place] AT [address] [place] [address] Phone: [phone] Fax: [phone]    Your procedure is scheduled on October 30th.  Report to [facility] Admitting at 0930 A.M.  Call this number if you have problems the morning of surgery:  [phone]   Remember:  Do not eat or drink after midnight.    Take these medicines the morning of surgery with A SIP OF WATER   atorvastatin (LIPITOR)  gabapentin (NEURONTIN)  meclizine (ANTIVERT)   7 days prior to surgery STOP taking any Aspirin(unless otherwise instructed by your surgeon), Aleve, Naproxen, Ibuprofen, Motrin, Advil, Goody's, BC's, all herbal medications, fish oil, and all vitamins    WHAT DO I DO ABOUT MY DIABETES MEDICATION?   Marland Kitchen Do not take oral diabetes medicines (pills) the morning of surgery: Metformin.  . The day of surgery, do not take other diabetes injectables, including Byetta (exenatide), Bydureon (exenatide ER), Victoza (liraglutide), or Trulicity (dulaglutide).  . If your CBG is greater than 220 mg/dL, you may take  of your sliding scale (correction) dose of insulin.   How to Manage Your Diabetes Before and After Surgery  Why is it important to control my blood sugar before and after surgery? . Improving blood sugar levels before and after surgery helps healing and can limit problems. . A way of improving blood sugar control is eating a healthy diet by: o  Eating less sugar and carbohydrates o  Increasing activity/exercise o  Talking with your doctor about reaching your blood sugar goals . High blood sugars (greater than 180 mg/dL) can raise your risk of infections and slow your recovery, so you will need to focus on controlling your diabetes during the weeks before surgery. . Make sure that the doctor who takes care of your diabetes knows  about your planned surgery including the date and location.  How do I manage my blood sugar before surgery? . Check your blood sugar at least 4 times a day, starting 2 days before surgery, to make sure that the level is not too high or low. o Check your blood sugar the morning of your surgery when you wake up and every 2 hours until you get to the Short Stay unit. . If your blood sugar is less than 70 mg/dL, you will need to treat for low blood sugar: o Do not take insulin. o Treat a low blood sugar (less than 70 mg/dL) with  cup of clear juice (cranberry or apple), 4 glucose tablets, OR glucose gel. o Recheck blood sugar in 15 minutes after treatment (to make sure it is greater than 70 mg/dL). If your blood sugar is not greater than 70 mg/dL on recheck, call [phone] for further instructions. . Report your blood sugar to the short stay nurse when you get to Short Stay.  . If you are admitted to the hospital after surgery: o Your blood sugar will be checked by the staff and you will probably be given insulin after surgery (instead of oral diabetes medicines) to make sure you have good blood sugar levels. o The goal for blood sugar control after surgery is 80-180 mg/dL.    Do not wear jewelry.  Do not wear lotions, powders, or colognes, or deodorant.  Men may shave face and neck.  Do not bring valuables to the hospital.  [facility] is not responsible for any belongings or valuables.  Contacts, dentures or bridgework may not be worn into surgery.  Leave your suitcase in the car.  After surgery it may be brought to your room.  For patients admitted to the hospital, discharge time will be determined by your treatment team.  Patients discharged the day of surgery will not be allowed to drive home.    [facility]- Preparing For Surgery  Before surgery, you can play an important role. Because skin is not sterile, your skin needs to be as free of germs as possible. You can reduce the  number of germs on your skin by washing with CHG (chlorahexidine gluconate) Soap before surgery.  CHG is an antiseptic cleaner which kills germs and bonds with the skin to continue killing germs even after washing.    Oral Hygiene is also important to reduce your risk of infection.  Remember - BRUSH YOUR TEETH THE MORNING OF SURGERY WITH YOUR REGULAR TOOTHPASTE  Please do not use if you have an allergy to CHG or antibacterial soaps. If your skin becomes reddened/irritated stop using the CHG.  Do not shave (including legs and underarms) for at least 48 hours prior to first CHG shower. It is OK to shave your face.  Please follow these instructions carefully.   1. Shower the NIGHT BEFORE SURGERY and the MORNING OF SURGERY with CHG.   2. If you chose to wash your hair, wash your hair first as usual with your normal shampoo.  3. After you shampoo, rinse your hair and body thoroughly to remove the shampoo.  4. Use CHG as you would any other liquid soap. You can apply CHG directly to the skin and wash gently with a scrungie or a clean washcloth.   5. Apply the CHG Soap to your body ONLY FROM THE NECK DOWN.  Do not use on open wounds or open sores. Avoid contact with your eyes, ears, mouth and genitals (private parts). Wash Face and genitals (private parts)  with your normal soap.  6. Wash thoroughly, paying special attention to the area where your surgery will be performed.  7. Thoroughly rinse your body with warm water from the neck down.  8. DO NOT shower/wash with your normal soap after using and rinsing off the CHG Soap.  9. Pat yourself dry with a CLEAN TOWEL.  10. Wear CLEAN PAJAMAS to bed the night before surgery, wear comfortable clothes the morning of surgery  11. Place CLEAN SHEETS on your bed the night of your first shower and DO NOT SLEEP WITH PETS.    Day of Surgery:  Do not apply any deodorants/lotions.  Please wear clean clothes to the hospital/surgery center.   Remember  to brush your teeth WITH YOUR REGULAR TOOTHPASTE.    Please read over the following fact sheets that you were given.

## 2018-05-29 ENCOUNTER — Encounter: Payer: Self-pay | Admitting: Gastroenterology

## 2018-05-29 ENCOUNTER — Encounter (HOSPITAL_COMMUNITY): Payer: Self-pay

## 2018-05-29 ENCOUNTER — Ambulatory Visit (INDEPENDENT_AMBULATORY_CARE_PROVIDER_SITE_OTHER): Payer: Medicare HMO | Admitting: Gastroenterology

## 2018-05-29 ENCOUNTER — Other Ambulatory Visit: Payer: Self-pay

## 2018-05-29 ENCOUNTER — Encounter (HOSPITAL_COMMUNITY)
Admission: RE | Admit: 2018-05-29 | Discharge: 2018-05-29 | Disposition: A | Discharge status: Home / Self Care | Payer: Medicare HMO | Source: Ambulatory Visit | Attending: Otolaryngology | Admitting: Otolaryngology

## 2018-05-29 VITALS — BP 114/70 | HR 88 | Ht 73.0 in | Wt 216.2 lb

## 2018-05-29 DIAGNOSIS — Z791 Long term (current) use of non-steroidal anti-inflammatories (NSAID): Secondary | ICD-10-CM

## 2018-05-29 DIAGNOSIS — R195 Other fecal abnormalities: Secondary | ICD-10-CM

## 2018-05-29 DIAGNOSIS — K625 Hemorrhage of anus and rectum: Secondary | ICD-10-CM | POA: Diagnosis not present

## 2018-05-29 DIAGNOSIS — Z01812 Encounter for preprocedural laboratory examination: Secondary | ICD-10-CM | POA: Insufficient documentation

## 2018-05-29 LAB — BASIC METABOLIC PANEL
Anion gap: 6 (ref 5–15)
BUN: 16 mg/dL (ref 8–23)
CHLORIDE: 106 mmol/L (ref 98–111)
CO2: 25 mmol/L (ref 22–32)
CREATININE: 1 mg/dL (ref 0.61–1.24)
Calcium: 9.4 mg/dL (ref 8.9–10.3)
GFR calc Af Amer: >60 mL/min (ref >60)
GFR calc non Af Amer: >60 mL/min (ref >60)
Glucose, Bld: 93 mg/dL (ref 70–99)
Potassium: 3.9 mmol/L (ref 3.5–5.1)
SODIUM: 137 mmol/L (ref 135–145)

## 2018-05-29 LAB — CBC
HEMATOCRIT: 43.3 % (ref 39.0–52.0)
Hemoglobin: 14 g/dL (ref 13.0–17.0)
MCH: 29.5 pg (ref 26.0–34.0)
MCHC: 32.3 g/dL (ref 30.0–36.0)
MCV: 91.2 fL (ref 80.0–100.0)
Platelets: 251 10*3/uL (ref 150–400)
RBC: 4.75 MIL/uL (ref 4.22–5.81)
RDW: 13.3 % (ref 11.5–15.5)
WBC: 6.3 10*3/uL (ref 4.0–10.5)
nRBC: 0 % (ref 0.0–0.2)

## 2018-05-29 LAB — GLUCOSE, CAPILLARY: Glucose-Capillary: 82 mg/dL (ref 70–99)

## 2018-05-29 MED ORDER — PEG 3350-KCL-NA BICARB-NACL 420 G PO SOLR: 4000.0000 mL | ORAL | 0 refills | Status: DC

## 2018-05-29 NOTE — Patient Instructions (Signed)
You have been scheduled for a colonoscopy. Please follow written instructions given to you at your visit today.  Please pick up your prep supplies at the pharmacy within the next 1-3 days. If you use inhalers (even only as needed), please bring them with you on the day of your procedure. Your physician has requested that you go to www.startemmi.com and enter the access code given to you at your visit today. This web site gives a general overview about your procedure. However, you should still follow specific instructions given to you by our office regarding your preparation for the procedure.  Please start a fiber supplement 1-2 times daily. (Fibercon,Metamucil,Benefiber)  Thank you for entrusting me with your care and choosing [facility] care.  Dr Rush Landmark

## 2018-05-29 NOTE — Progress Notes (Signed)
[place] VISIT   Primary Care Provider Eston Esters, NP [address] [place] [address] [phone]  Referring Provider Eston Esters, [place] [address] [place], [place] [address] [phone]  Patient Profile: [name] is a 67 y.o. male with a pmh significant for HCV (s/p treatment), Alcohol Abuse (now sober), BPH, DM, GERD, recently diagnosed SCC of unclear primary.  The patient presents to the [facility] for an evaluation and management of problem(s) noted below:  Problem List 1. Dark stools   2. Rectal bleeding   3. NSAID long-term use     History of Present Illness: This is the patient's first visit to the [facility] GI clinic.  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  He is moving forward with treatment and has an upcoming surgery in the next couple of weeks.  Since the onset of his right-sided lymphadenopathy he has had some issues with dysphagia to solid foods.  However over the course of the last few weeks while being on high-dose nonsteroidals he has had improvement in that.  He describes no liquid dysphagia.  He had some weight gain no overt weight loss.  He describes for the last few months noting episodes of bright red blood per rectum with wiping.  This is always associated with brown stool and red blood on top of it and admixed at times.  No overt maroon stool or hematochezia has been noted.  3 weeks ago he noted 2 days of black stools.  This was at a time that he was taking significant nonsteroidals on a daily basis multiple times.  Around the time of his initial diagnosis.  He has a history of previously treated hepatitis C although we do not have all the records not clear that he is ever had evidence of advanced scarring.  As noted his family history there is a paternal uncle who has liver cancer as a result of alcohol disease as well as a  paternal grandmother who had gastric cancer.  He describes a pain that occurs in his midepigastrium 1-2 times per month lasting for 1 minute.  This relieves on its own.  There is no association with food intake.  There is no aggravating or alleviating factors.  There is no associated nausea or vomiting he has 1-3 bowel movements on a daily basis that are formed and brown.  Very infrequently will have constipation noted as not having a bowel movement.  He does not note any significant straining.  He is never had an upper or lower endoscopy.  He does have mild GERD symptoms that come and go and are controlled if he does not overindulge himself from dietary perspective.  GI Review of Systems Positive as above Negative for odynophagia, jaundice, bloating  Review of Systems General: Denies fevers/chills HEENT: Denies oral lesions Cardiovascular: Denies chest pain Pulmonary: Denies shortness of breath Gastroenterological: See HPI Genitourinary: Denies darkened urine Hematological: Denies easy bruising/bleeding Endocrine: Denies temperature intolerance Psychological: Mood is anxious about the next steps in his treatment for his cancer Musculoskeletal: Denies new arthralgias   Medications Current Outpatient Medications  Medication Sig Dispense Refill  . atorvastatin (LIPITOR) 20 MG tablet Take 20 mg by mouth daily at 6 PM.     . metFORMIN (GLUCOPHAGE) 1000 MG tablet Take 1,000 mg by mouth 2 (two) times daily with a meal.    . Naproxen Sod-diphenhydrAMINE (ALEVE PM) 220-25 MG TABS Take 2 tablets  by mouth as directed. Three times weekly for sleep    . sodium fluoride (PREVIDENT 5000 PLUS) 1.1 % CREA dental cream Apply cream to tooth brush. Brush teeth for 2 minutes. Spit out excess-DO NOT swallow. Repeat nightly. 1 Tube prn  . tamsulosin (FLOMAX) 0.4 MG CAPS capsule Take 1 capsule (0.4 mg total) by mouth daily. 14 capsule 0  . traMADol (ULTRAM) 50 MG tablet Take 50 mg by mouth every 6 (six) hours as  needed.    . gabapentin (NEURONTIN) 300 MG capsule Take 300 mg by mouth 3 (three) times daily.    . polyethylene glycol-electrolytes (NULYTELY/GOLYTELY) 420 g solution Take 4,000 mLs by mouth as directed. 4000 mL 0   No current facility-administered medications for this visit.     Allergies No Known Allergies  Histories Past Medical History:  Diagnosis Date  . Alcohol abuse    until age 71  . Arthritis   . Bell's palsy 05/2017  . BPH (benign prostatic hyperplasia)   . Chronic hepatitis C without hepatic coma ([place]) FOLLOWED BY INFECTOUS DISEASE DR COMER   POSITIVE ANTIBODY TEST THIS YEAR 2016--  CURRENT TX ON HARVONI PO- completeed 2016  . Chronic low back pain   . Collapsed lung    right .Marland Kitchen..15 yrs ago, fell and hit some bricks  . DDD (degenerative disc disease), lumbosacral   . Depression    no meds now- med used for bladder  . 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 ED (erectile dysfunction)   . Foley catheter in place    post lumbar surgery- cath left in throughh rehab- had 2 UTI's  . GERD (gastroesophageal reflux disease)    hx none in long time  . History of cocaine abuse ([place])    per pt quit and last used 06-03-2013  . Urinary retention    Past Surgical History:  Procedure Laterality Date  . APPENDECTOMY  1980's  . BACK SURGERY    . [place]  . CLOSED REDUCTION AND INTRAMAXILLARY FIXATION BILATERAL MENTAL FX'S/  PLACEMENT MAXILLARY STENT/ APERTURE WIRE PLACEMENT  01-04-2000  . DIRECT LARYNGOSCOPY N/A 04/21/2018   Procedure: DIRECT LARYNGOSCOPY WITH BIOPSY OF TONGUE BASE AND NASOPHARYNX;  Surgeon: Jerrell Belfast, MD;  Location: [place];  Service: ENT;  Laterality: N/A;  Direct laryngoscopy with biopsy of tongue base and nasopharynx  . POSTERIOR LUMBAR FUSION  03-05-2015   laminectomy /  nerve decompression--  L4 -- S1  . TONSILLECTOMY  04/21/2018   Procedure: RIGHT TONSILLECTOMY;  Surgeon: Jerrell Belfast, MD;  Location: [place] [place];   Service: ENT;;  . TOTAL KNEE ARTHROPLASTY Left 09/21/2013   Procedure: LEFT TOTAL KNEE ARTHROPLASTY;  Surgeon: Yvette Rack., MD;  Location: [facility];  Service: Orthopedics;  Laterality: Left;  . TRANSURETHRAL RESECTION OF PROSTATE N/A 06/16/2015   Procedure: TRANSURETHRAL RESECTION OF THE PROSTATE WITH GYRUS INSTRUMENTS;  Surgeon: Franchot Gallo, MD;  Location: [facility];  Service: Urology;  Laterality: N/A;   Social History   Socioeconomic History  . Marital status: Divorced    Spouse name: Not on file  . Number of children: Not on file  . Years of education: Not on file  . Highest education level: Not on file  Occupational History  . Occupation: [employer]  . Financial resource strain: Not on file  . Food insecurity:    Worry: Not on file    Inability: Not on file  .  Transportation needs:    Medical: Yes    Non-medical: Yes  Tobacco Use  . Smoking status: Never Smoker  . Smokeless tobacco: Never Used  Substance and Sexual Activity  . Alcohol use: Not Currently    Comment: occ, heavy drinker prior to age 5, completely stopped 14 months ago; none as off 05/29/18  . Drug use: Not Currently    Types: "Crack" cocaine    Comment: per pt quit and last used crack 04/16/17  . Sexual activity: Not on file    Comment: declined condoms  Lifestyle  . Physical activity:    Days per week: Not on file    Minutes per session: Not on file  . Stress: Not on file  Relationships  . Social connections:    Talks on phone: Not on file    Gets together: Not on file    Attends religious service: Not on file    Active member of club or organization: Not on file    Attends meetings of clubs or organizations: Not on file    Relationship status: Not on file  . Intimate partner violence:    Fear of current or ex partner: No    Emotionally abused: No    Physically abused: No    Forced sexual activity: No  Other Topics Concern  . Not on file  Social History  Narrative  . Not on file   Family History  Problem Relation Age of Onset  . Liver cancer Paternal Grandmother   . Bone cancer Paternal Grandfather   . Stomach cancer Paternal Uncle   . Colon cancer Neg Hx   . Esophageal cancer Neg Hx   . Pancreatic cancer Neg Hx   . Inflammatory bowel disease Neg Hx   . Rectal cancer Neg Hx    I have reviewed his medical, social, and family history in detail and updated the electronic medical record as necessary.    PHYSICAL EXAMINATION  BP 114/70   Pulse 88   Ht 6\' 1"  (1.854 m)   Wt 216 lb 3.2 oz (98.1 kg)   BMI 28.52 kg/m  Wt Readings from Last 3 Encounters:  05/29/18 217 lb 12.8 oz (98.8 kg)  05/29/18 216 lb 3.2 oz (98.1 kg)  05/12/18 221 lb 6 oz (100.4 kg)  GEN: NAD, appears stated age, doesn't appear chronically ill PSYCH: Cooperative, without pressured speech EYE: Conjunctivae pink, sclerae anicteric ENT: MMM, without oral ulcers, no erythema or exudates noted NECK: Supple, with enlarged lymphadenopathy on his right neck CV: RR without R/Gs  RESP: CTAB posteriorly, without wheezing GI: NABS, soft, NT/ND, without rebound or guarding, no HSM appreciated GU: DRE performed shows a single external hemorrhoid as well as likely internal hemorrhoid on palpation and normal perineal descent and no blood in the vault with brown stool MSK/EXT: No lower extremity edema SKIN: No jaundice, no spider angiomata NEURO:  Alert & Oriented x 3, no focal deficits   REVIEW OF DATA  I reviewed the following data at the time of this encounter:  GI Procedures and Studies  None to review  Laboratory Studies  Reviewed in epic  Imaging Studies  January 2018 CT abdomen pelvis with contrast IMPRESSION: 1. Mild wall thickening at the left renal pelvis raises question for mild ureteritis. 2. 4 mm nonobstructing stone at the upper pole of the left kidney. 3. New 12 cm collection of fluid noted posterior to the lumbar spine, with mild surrounding soft  tissue inflammation, possibly reflecting evolving postoperative  seroma given recent surgery.  September 2014 abdominal ultrasound IMPRESSION: Hyperechoic liver lesion consistent with hemangioma. Examination is otherwise unremarkable.   ASSESSMENT  Mr. Humbarger is a 67 y.o. male with a pmh significant for HCV (s/p treatment), Alcohol Abuse (now sober), BPH, DM, GERD, recently diagnosed SCC of unclear primary.  The patient is seen today for evaluation and management of:  1. Dark stools   2. Rectal bleeding   3. NSAID long-term use    This is a hemodynamically stable patient who is referred for issues of recent rectal bleeding of unclear etiology.  Based on his clinical exam and history it seems like this is most likely significant for hemorrhoidal disease.  He denies any significant pain with this so anal fissure seems less likely to remain in the differential diagnosis.  He has not had a lower endoscopy thus will etiologies for potential bleeding should remain in the differential diagnosis until these are excluded.  He describes the use of a colonoscopy to further delineate a diagnosis.  We also stated that he should begin initiation of fiber supplementation on a daily basis once to twice daily over-the-counter with desire for FiberCon Metamucil.  Interestingly in the timing of significant NSAID use a few weeks ago while he was dealing with neck pain he had 2 days worth of darker stools.  His hemogram is completely normal his BUN is normal as well.  I suspect he had a little bit of gastritis or gastropathy as a result of the NSAID use which is.  He has normal formed brown stools at this point in time.  We will hold on a upper endoscopic evaluation unless the patient has recurrence of his dark stools.  I will not send iron indices as he has a completely normal hemoglobin and normal MCV at this point in time.  The risks and benefits of endoscopic evaluation were discussed with the patient; these include but  are not limited to the risk of perforation, infection, bleeding, missed lesions, lack of diagnosis, severe illness requiring hospitalization, as well as anesthesia and sedation related illnesses.  The patient is agreeable to proceed.  All patient questions were answered, to the best of my ability, and the patient agrees to the aforementioned plan of action with follow-up as indicated.   PLAN  1. Rectal bleeding - Fiber supplementation - Diagnostic Colonoscopy  2. Dark stools - Resolved likely 2/2 to NSAID use and will hold on EGD for now  3. NSAID long-term use   Orders Placed This Encounter  Procedures  . Ambulatory referral to Gastroenterology    New Prescriptions   POLYETHYLENE GLYCOL-ELECTROLYTES (NULYTELY/GOLYTELY) 420 G SOLUTION    Take 4,000 mLs by mouth as directed.   Modified Medications   No medications on file    Planned Follow Up: No follow-ups on file.   Justice Britain, MD [facility] Office # [phone]

## 2018-05-29 NOTE — Progress Notes (Signed)
PCP: [facility]  Cardiologist: denies  DM: Type 2 (pre-diabetic)  Pt denies SOB, cough, fever, chest pain.  Pt states understanding of instructions given for day of surgery.

## 2018-05-30 ENCOUNTER — Encounter: Payer: Self-pay | Admitting: Gastroenterology

## 2018-05-30 DIAGNOSIS — R195 Other fecal abnormalities: Secondary | ICD-10-CM | POA: Insufficient documentation

## 2018-05-30 DIAGNOSIS — K625 Hemorrhage of anus and rectum: Secondary | ICD-10-CM | POA: Insufficient documentation

## 2018-05-30 DIAGNOSIS — Z791 Long term (current) use of non-steroidal anti-inflammatories (NSAID): Secondary | ICD-10-CM | POA: Insufficient documentation

## 2018-05-31 ENCOUNTER — Other Ambulatory Visit: Payer: Self-pay

## 2018-05-31 ENCOUNTER — Inpatient Hospital Stay (HOSPITAL_COMMUNITY): Payer: Medicare HMO | Admitting: Certified Registered Nurse Anesthetist

## 2018-05-31 ENCOUNTER — Encounter (HOSPITAL_COMMUNITY)
Admission: RE | Disposition: A | Discharge status: Home / Self Care | Payer: Self-pay | Source: Home / Self Care | Attending: Otolaryngology

## 2018-05-31 ENCOUNTER — Encounter (HOSPITAL_COMMUNITY): Payer: Self-pay | Admitting: *Deleted

## 2018-05-31 ENCOUNTER — Inpatient Hospital Stay (HOSPITAL_COMMUNITY)
Admission: RE | Admit: 2018-05-31 | Discharge: 2018-06-05 | DRG: 828 | Disposition: A | Discharge status: Home / Self Care | Payer: Medicare HMO | Attending: Otolaryngology | Admitting: Otolaryngology

## 2018-05-31 DIAGNOSIS — E119 Type 2 diabetes mellitus without complications: Secondary | ICD-10-CM | POA: Diagnosis present

## 2018-05-31 DIAGNOSIS — R338 Other retention of urine: Secondary | ICD-10-CM | POA: Diagnosis present

## 2018-05-31 DIAGNOSIS — Z800 Family history of malignant neoplasm of digestive organs: Secondary | ICD-10-CM | POA: Diagnosis not present

## 2018-05-31 DIAGNOSIS — K219 Gastro-esophageal reflux disease without esophagitis: Secondary | ICD-10-CM | POA: Diagnosis present

## 2018-05-31 DIAGNOSIS — F329 Major depressive disorder, single episode, unspecified: Secondary | ICD-10-CM | POA: Diagnosis present

## 2018-05-31 DIAGNOSIS — N401 Enlarged prostate with lower urinary tract symptoms: Secondary | ICD-10-CM | POA: Diagnosis present

## 2018-05-31 DIAGNOSIS — B182 Chronic viral hepatitis C: Secondary | ICD-10-CM | POA: Diagnosis present

## 2018-05-31 DIAGNOSIS — Z9079 Acquired absence of other genital organ(s): Secondary | ICD-10-CM

## 2018-05-31 DIAGNOSIS — Z981 Arthrodesis status: Secondary | ICD-10-CM

## 2018-05-31 DIAGNOSIS — N400 Enlarged prostate without lower urinary tract symptoms: Secondary | ICD-10-CM | POA: Diagnosis present

## 2018-05-31 DIAGNOSIS — C7989 Secondary malignant neoplasm of other specified sites: Principal | ICD-10-CM | POA: Diagnosis present

## 2018-05-31 DIAGNOSIS — Z96652 Presence of left artificial knee joint: Secondary | ICD-10-CM | POA: Diagnosis present

## 2018-05-31 DIAGNOSIS — G8929 Other chronic pain: Secondary | ICD-10-CM | POA: Diagnosis present

## 2018-05-31 DIAGNOSIS — Z7984 Long term (current) use of oral hypoglycemic drugs: Secondary | ICD-10-CM | POA: Diagnosis not present

## 2018-05-31 DIAGNOSIS — Z79899 Other long term (current) drug therapy: Secondary | ICD-10-CM | POA: Diagnosis not present

## 2018-05-31 DIAGNOSIS — Z79891 Long term (current) use of opiate analgesic: Secondary | ICD-10-CM | POA: Diagnosis not present

## 2018-05-31 HISTORY — PX: RADICAL NECK DISSECTION: SHX2284

## 2018-05-31 LAB — CBC
HCT: 44.6 % (ref 39.0–52.0)
HEMOGLOBIN: 14.6 g/dL (ref 13.0–17.0)
MCH: 29.2 pg (ref 26.0–34.0)
MCHC: 32.7 g/dL (ref 30.0–36.0)
MCV: 89.2 fL (ref 80.0–100.0)
Platelets: 240 10*3/uL (ref 150–400)
RBC: 5 MIL/uL (ref 4.22–5.81)
RDW: 13.2 % (ref 11.5–15.5)
WBC: 7.2 10*3/uL (ref 4.0–10.5)
nRBC: 0 % (ref 0.0–0.2)

## 2018-05-31 LAB — GLUCOSE, CAPILLARY
Glucose-Capillary: 107 mg/dL — ABNORMAL HIGH (ref 70–99)
Glucose-Capillary: 111 mg/dL — ABNORMAL HIGH (ref 70–99)
Glucose-Capillary: 119 mg/dL — ABNORMAL HIGH (ref 70–99)
Glucose-Capillary: 156 mg/dL — ABNORMAL HIGH (ref 70–99)

## 2018-05-31 LAB — CREATININE, SERUM
CREATININE: 0.96 mg/dL (ref 0.61–1.24)
GFR calc Af Amer: >60 mL/min (ref >60)

## 2018-05-31 SURGERY — DISSECTION, NECK, RADICAL
Anesthesia: General | Site: Neck | Laterality: Right

## 2018-05-31 MED ORDER — DEXTROSE IN LACTATED RINGERS 5 % IV SOLN
INTRAVENOUS | Status: DC
  Administered 2018-05-31 – 2018-06-05 (×5): via INTRAVENOUS

## 2018-05-31 MED ORDER — SODIUM CHLORIDE 0.9 % IV SOLN
INTRAVENOUS | Status: DC | PRN
  Administered 2018-05-31: 15 ug/min via INTRAVENOUS

## 2018-05-31 MED ORDER — LACTATED RINGERS IV SOLN
Freq: Once | INTRAVENOUS | Status: AC
  Administered 2018-05-31: 10:00:00 via INTRAVENOUS

## 2018-05-31 MED ORDER — LIDOCAINE 2% (20 MG/ML) 5 ML SYRINGE
INTRAMUSCULAR | Status: DC | PRN
  Administered 2018-05-31: 100 mg via INTRAVENOUS

## 2018-05-31 MED ORDER — MIDAZOLAM HCL 5 MG/5ML IJ SOLN
INTRAMUSCULAR | Status: DC | PRN
  Administered 2018-05-31: 1 mg via INTRAVENOUS

## 2018-05-31 MED ORDER — DEXAMETHASONE SODIUM PHOSPHATE 10 MG/ML IJ SOLN
10.0000 mg | Freq: Once | INTRAMUSCULAR | Status: DC
  Filled 2018-05-31: qty 1

## 2018-05-31 MED ORDER — BACITRACIN ZINC 500 UNIT/GM EX OINT
TOPICAL_OINTMENT | CUTANEOUS | Status: DC | PRN
  Administered 2018-05-31: 1 via TOPICAL

## 2018-05-31 MED ORDER — FENTANYL CITRATE (PF) 100 MCG/2ML IJ SOLN
INTRAMUSCULAR | Status: DC | PRN
  Administered 2018-05-31: 150 ug via INTRAVENOUS
  Administered 2018-05-31 (×3): 25 ug via INTRAVENOUS

## 2018-05-31 MED ORDER — BACITRACIN ZINC 500 UNIT/GM EX OINT
TOPICAL_OINTMENT | CUTANEOUS | Status: AC
  Filled 2018-05-31: qty 28.35

## 2018-05-31 MED ORDER — TAMSULOSIN HCL 0.4 MG PO CAPS
0.4000 mg | ORAL_CAPSULE | Freq: Every day | ORAL | Status: DC
  Administered 2018-06-01 – 2018-06-05 (×5): 0.4 mg via ORAL
  Filled 2018-05-31 (×5): qty 1

## 2018-05-31 MED ORDER — ONDANSETRON HCL 4 MG/2ML IJ SOLN: 4.0000 mg | INTRAMUSCULAR | Status: DC | PRN

## 2018-05-31 MED ORDER — DEXAMETHASONE SODIUM PHOSPHATE 10 MG/ML IJ SOLN
INTRAMUSCULAR | Status: AC
  Filled 2018-05-31: qty 1

## 2018-05-31 MED ORDER — HEPARIN SODIUM (PORCINE) 5000 UNIT/ML IJ SOLN
5000.0000 [IU] | Freq: Three times a day (TID) | INTRAMUSCULAR | Status: DC
  Administered 2018-05-31 – 2018-06-05 (×14): 5000 [IU] via SUBCUTANEOUS
  Filled 2018-05-31 (×15): qty 1

## 2018-05-31 MED ORDER — IBUPROFEN 200 MG PO TABS
200.0000 mg | ORAL_TABLET | Freq: Four times a day (QID) | ORAL | Status: DC | PRN
  Administered 2018-06-04 – 2018-06-05 (×2): 200 mg via ORAL
  Filled 2018-05-31 (×3): qty 1

## 2018-05-31 MED ORDER — LIDOCAINE 2% (20 MG/ML) 5 ML SYRINGE
INTRAMUSCULAR | Status: AC
  Filled 2018-05-31: qty 5

## 2018-05-31 MED ORDER — PROPOFOL 10 MG/ML IV BOLUS
INTRAVENOUS | Status: DC | PRN
  Administered 2018-05-31: 100 mg via INTRAVENOUS

## 2018-05-31 MED ORDER — IBUPROFEN 100 MG/5ML PO SUSP
400.0000 mg | Freq: Four times a day (QID) | ORAL | Status: DC | PRN
  Filled 2018-05-31: qty 20

## 2018-05-31 MED ORDER — PROPOFOL 10 MG/ML IV BOLUS
INTRAVENOUS | Status: AC
  Filled 2018-05-31: qty 20

## 2018-05-31 MED ORDER — MEPERIDINE HCL 50 MG/ML IJ SOLN: 6.2500 mg | INTRAMUSCULAR | Status: DC | PRN

## 2018-05-31 MED ORDER — FENTANYL CITRATE (PF) 250 MCG/5ML IJ SOLN
INTRAMUSCULAR | Status: AC
  Filled 2018-05-31: qty 5

## 2018-05-31 MED ORDER — LIDOCAINE-EPINEPHRINE 1 %-1:100000 IJ SOLN
INTRAMUSCULAR | Status: AC
  Filled 2018-05-31: qty 1

## 2018-05-31 MED ORDER — ACETAMINOPHEN 10 MG/ML IV SOLN
1000.0000 mg | INTRAVENOUS | Status: AC
  Administered 2018-05-31: 1000 mg via INTRAVENOUS
  Filled 2018-05-31: qty 100

## 2018-05-31 MED ORDER — MIDAZOLAM HCL 2 MG/2ML IJ SOLN
INTRAMUSCULAR | Status: AC
  Filled 2018-05-31: qty 2

## 2018-05-31 MED ORDER — ONDANSETRON HCL 4 MG/2ML IJ SOLN: 4.0000 mg | Freq: Once | INTRAMUSCULAR | Status: DC | PRN

## 2018-05-31 MED ORDER — HYDROCODONE-ACETAMINOPHEN 5-325 MG PO TABS
ORAL_TABLET | ORAL | Status: AC
  Filled 2018-05-31: qty 1

## 2018-05-31 MED ORDER — ROCURONIUM BROMIDE 50 MG/5ML IV SOSY
PREFILLED_SYRINGE | INTRAVENOUS | Status: AC
  Filled 2018-05-31: qty 5

## 2018-05-31 MED ORDER — INSULIN ASPART 100 UNIT/ML ~~LOC~~ SOLN
0.0000 [IU] | Freq: Three times a day (TID) | SUBCUTANEOUS | Status: DC
  Administered 2018-06-01: 1 [IU] via SUBCUTANEOUS

## 2018-05-31 MED ORDER — DEXAMETHASONE SODIUM PHOSPHATE 10 MG/ML IJ SOLN
INTRAMUSCULAR | Status: DC | PRN
  Administered 2018-05-31: 10 mg via INTRAVENOUS

## 2018-05-31 MED ORDER — ONDANSETRON HCL 4 MG/2ML IJ SOLN
INTRAMUSCULAR | Status: AC
  Filled 2018-05-31: qty 2

## 2018-05-31 MED ORDER — 0.9 % SODIUM CHLORIDE (POUR BTL) OPTIME
TOPICAL | Status: DC | PRN
  Administered 2018-05-31: 1000 mL

## 2018-05-31 MED ORDER — HYDROCODONE-ACETAMINOPHEN 5-325 MG PO TABS
1.0000 | ORAL_TABLET | ORAL | Status: DC | PRN
  Administered 2018-05-31: 2 via ORAL
  Administered 2018-05-31: 1 via ORAL
  Administered 2018-06-01 – 2018-06-04 (×10): 2 via ORAL
  Administered 2018-06-04 – 2018-06-05 (×4): 1 via ORAL
  Filled 2018-05-31 (×2): qty 2
  Filled 2018-05-31: qty 1
  Filled 2018-05-31 (×3): qty 2
  Filled 2018-05-31: qty 1
  Filled 2018-05-31 (×4): qty 2
  Filled 2018-05-31 (×2): qty 1
  Filled 2018-05-31 (×2): qty 2

## 2018-05-31 MED ORDER — HYDROMORPHONE HCL 1 MG/ML IJ SOLN
0.2500 mg | INTRAMUSCULAR | Status: DC | PRN
  Administered 2018-05-31 (×2): 0.5 mg via INTRAVENOUS

## 2018-05-31 MED ORDER — SUGAMMADEX SODIUM 200 MG/2ML IV SOLN
INTRAVENOUS | Status: DC | PRN
  Administered 2018-05-31: 200 mg via INTRAVENOUS

## 2018-05-31 MED ORDER — ONDANSETRON HCL 4 MG/2ML IJ SOLN
INTRAMUSCULAR | Status: DC | PRN
  Administered 2018-05-31: 4 mg via INTRAVENOUS

## 2018-05-31 MED ORDER — ZOLPIDEM TARTRATE 5 MG PO TABS: 5.0000 mg | ORAL_TABLET | Freq: Every evening | ORAL | Status: DC | PRN

## 2018-05-31 MED ORDER — MORPHINE SULFATE (PF) 2 MG/ML IV SOLN
2.0000 mg | INTRAVENOUS | Status: DC | PRN
  Administered 2018-05-31 – 2018-06-03 (×4): 2 mg via INTRAVENOUS
  Administered 2018-06-04 (×2): 4 mg via INTRAVENOUS
  Filled 2018-05-31 (×2): qty 2
  Filled 2018-05-31: qty 1
  Filled 2018-05-31: qty 2
  Filled 2018-05-31 (×2): qty 1

## 2018-05-31 MED ORDER — PEG 3350-KCL-NA BICARB-NACL 420 G PO SOLR: 4000.0000 mL | ORAL | Status: DC

## 2018-05-31 MED ORDER — ATORVASTATIN CALCIUM 20 MG PO TABS
20.0000 mg | ORAL_TABLET | Freq: Every day | ORAL | Status: DC
  Administered 2018-05-31 – 2018-06-04 (×5): 20 mg via ORAL
  Filled 2018-05-31 (×5): qty 1

## 2018-05-31 MED ORDER — CEFAZOLIN SODIUM-DEXTROSE 2-4 GM/100ML-% IV SOLN
2.0000 g | INTRAVENOUS | Status: AC
  Administered 2018-05-31: 2 g via INTRAVENOUS
  Filled 2018-05-31: qty 100

## 2018-05-31 MED ORDER — CHLORHEXIDINE GLUCONATE CLOTH 2 % EX PADS: 6.0000 | MEDICATED_PAD | Freq: Once | CUTANEOUS | Status: DC

## 2018-05-31 MED ORDER — GABAPENTIN 300 MG PO CAPS
300.0000 mg | ORAL_CAPSULE | Freq: Three times a day (TID) | ORAL | Status: DC
  Administered 2018-05-31 – 2018-06-05 (×14): 300 mg via ORAL
  Filled 2018-05-31 (×14): qty 1

## 2018-05-31 MED ORDER — ROCURONIUM BROMIDE 10 MG/ML (PF) SYRINGE
PREFILLED_SYRINGE | INTRAVENOUS | Status: DC | PRN
  Administered 2018-05-31: 20 mg via INTRAVENOUS
  Administered 2018-05-31: 50 mg via INTRAVENOUS
  Administered 2018-05-31: 20 mg via INTRAVENOUS

## 2018-05-31 MED ORDER — METFORMIN HCL 500 MG PO TABS
1000.0000 mg | ORAL_TABLET | Freq: Two times a day (BID) | ORAL | Status: DC
  Administered 2018-05-31 – 2018-06-05 (×8): 1000 mg via ORAL
  Filled 2018-05-31 (×9): qty 2

## 2018-05-31 MED ORDER — BACITRACIN ZINC 500 UNIT/GM EX OINT
1.0000 "application " | TOPICAL_OINTMENT | Freq: Three times a day (TID) | CUTANEOUS | Status: DC
  Administered 2018-05-31 – 2018-06-05 (×16): 1 via TOPICAL
  Filled 2018-05-31 (×3): qty 28.35

## 2018-05-31 MED ORDER — HYDROMORPHONE HCL 1 MG/ML IJ SOLN
INTRAMUSCULAR | Status: AC
  Filled 2018-05-31: qty 1

## 2018-05-31 MED ORDER — ESMOLOL HCL 100 MG/10ML IV SOLN
INTRAVENOUS | Status: DC | PRN
  Administered 2018-05-31: 30 mg via INTRAVENOUS

## 2018-05-31 MED ORDER — NAPROXEN SOD-DIPHENHYDRAMINE 220-25 MG PO TABS: 2.0000 | ORAL_TABLET | ORAL | Status: DC

## 2018-05-31 MED ORDER — DEXMEDETOMIDINE HCL 200 MCG/2ML IV SOLN
INTRAVENOUS | Status: DC | PRN
  Administered 2018-05-31 (×2): 8 ug via INTRAVENOUS
  Administered 2018-05-31: 12 ug via INTRAVENOUS

## 2018-05-31 MED ORDER — ONDANSETRON HCL 4 MG PO TABS: 4.0000 mg | ORAL_TABLET | ORAL | Status: DC | PRN

## 2018-05-31 MED ORDER — LACTATED RINGERS IV SOLN
INTRAVENOUS | Status: DC | PRN
  Administered 2018-05-31 (×2): via INTRAVENOUS

## 2018-05-31 MED ORDER — LIDOCAINE-EPINEPHRINE 1 %-1:100000 IJ SOLN
INTRAMUSCULAR | Status: DC | PRN
  Administered 2018-05-31: 5 mL

## 2018-05-31 SURGICAL SUPPLY — 48 items
ATTRACTOMAT 16X20 MAGNETIC DRP (DRAPES) IMPLANT
BLADE SURG 10 STRL SS (BLADE) IMPLANT
BLADE SURG 15 STRL LF DISP TIS (BLADE) ×1 IMPLANT
BLADE SURG 15 STRL SS (BLADE) ×3
CANISTER SUCT 3000ML PPV (MISCELLANEOUS) ×3 IMPLANT
CLEANER TIP ELECTROSURG 2X2 (MISCELLANEOUS) ×3 IMPLANT
CORDS BIPOLAR (ELECTRODE) ×3 IMPLANT
COVER SURGICAL LIGHT HANDLE (MISCELLANEOUS) ×3 IMPLANT
COVER WAND RF STERILE (DRAPES) ×3 IMPLANT
DRAIN CHANNEL 15F RND FF W/TCR (WOUND CARE) IMPLANT
DRAIN SNY 10 ROU (WOUND CARE) ×3 IMPLANT
DRAIN SNY 7 FPER (WOUND CARE) ×3 IMPLANT
DRAPE HALF SHEET 40X57 (DRAPES) IMPLANT
ELECT COATED BLADE 2.86 ST (ELECTRODE) ×3 IMPLANT
ELECT REM PT RETURN 9FT ADLT (ELECTROSURGICAL) ×3
ELECTRODE REM PT RTRN 9FT ADLT (ELECTROSURGICAL) ×1 IMPLANT
EVACUATOR SILICONE 100CC (DRAIN) ×3 IMPLANT
FORCEPS BIPOLAR SPETZLER 8 1.0 (NEUROSURGERY SUPPLIES) ×3 IMPLANT
GAUZE 4X4 16PLY RFD (DISPOSABLE) ×3 IMPLANT
GLOVE BIOGEL M 7.0 STRL (GLOVE) ×6 IMPLANT
GOWN STRL REUS W/ TWL LRG LVL3 (GOWN DISPOSABLE) ×2 IMPLANT
GOWN STRL REUS W/ TWL XL LVL3 (GOWN DISPOSABLE) ×1 IMPLANT
GOWN STRL REUS W/TWL LRG LVL3 (GOWN DISPOSABLE) ×4
GOWN STRL REUS W/TWL XL LVL3 (GOWN DISPOSABLE) ×2
KIT BASIN OR (CUSTOM PROCEDURE TRAY) ×3 IMPLANT
KIT TURNOVER KIT B (KITS) ×3 IMPLANT
LOCATOR NERVE 3 VOLT (DISPOSABLE) IMPLANT
NEEDLE HYPO 25GX1X1/2 BEV (NEEDLE) ×3 IMPLANT
NS IRRIG 1000ML POUR BTL (IV SOLUTION) ×3 IMPLANT
PAD ARMBOARD 7.5X6 YLW CONV (MISCELLANEOUS) ×6 IMPLANT
PENCIL BUTTON HOLSTER BLD 10FT (ELECTRODE) ×3 IMPLANT
SPONGE INTESTINAL PEANUT (DISPOSABLE) ×3 IMPLANT
SPONGE LAP 18X18 X RAY DECT (DISPOSABLE) IMPLANT
STAPLER VISISTAT 35W (STAPLE) ×3 IMPLANT
SUT CHROMIC 3 0 SH 27 (SUTURE) IMPLANT
SUT CHROMIC 5 0 P 3 (SUTURE) ×3 IMPLANT
SUT ETHILON 2 0 FS 18 (SUTURE) ×3 IMPLANT
SUT SILK 2 0 (SUTURE)
SUT SILK 2 0 SH CR/8 (SUTURE) ×3 IMPLANT
SUT SILK 2-0 18XBRD TIE 12 (SUTURE) IMPLANT
SUT SILK 3 0 REEL (SUTURE) ×3 IMPLANT
SUT SILK 4 0 (SUTURE)
SUT SILK 4-0 18XBRD TIE 12 (SUTURE) IMPLANT
SUT VIC AB 3-0 FS2 27 (SUTURE) IMPLANT
SUT VIC AB 3-0 SH 18 (SUTURE) ×3 IMPLANT
TOWEL OR 17X24 6PK STRL BLUE (TOWEL DISPOSABLE) IMPLANT
TRAY ENT MC OR (CUSTOM PROCEDURE TRAY) ×3 IMPLANT
TRAY FOLEY MTR SLVR 14FR STAT (SET/KITS/TRAYS/PACK) IMPLANT

## 2018-05-31 NOTE — Anesthesia Procedure Notes (Signed)
Procedure Name: Intubation Performed by: Milford Cage, CRNA Pre-anesthesia Checklist: Patient identified, Emergency Drugs available, Suction available and Patient being monitored Patient Re-evaluated:Patient Re-evaluated prior to induction Oxygen Delivery Method: Circle System Utilized Preoxygenation: Pre-oxygenation with 100% oxygen Induction Type: IV induction Ventilation: Mask ventilation without difficulty Laryngoscope Size: Mac and 4 Grade View: Grade I Tube type: Reinforced Tube size: 7.5 mm Number of attempts: 1 Airway Equipment and Method: Stylet Placement Confirmation: ETT inserted through vocal cords under direct vision,  positive ETCO2 and breath sounds checked- equal and bilateral Secured at: 23 cm Tube secured with: Tape Dental Injury: Teeth and Oropharynx as per pre-operative assessment

## 2018-05-31 NOTE — Anesthesia Preprocedure Evaluation (Signed)
Anesthesia Evaluation  Patient identified by MRN, date of birth, ID band Patient awake    Reviewed: Allergy & Precautions, NPO status , Patient's Chart, lab work & pertinent test results  Airway Mallampati: I  TM Distance: >3 FB Neck ROM: Full    Dental   Pulmonary    Pulmonary exam normal        Cardiovascular Normal cardiovascular exam     Neuro/Psych Depression    GI/Hepatic GERD  Medicated and Controlled,(+) Hepatitis -, C  Endo/Other  diabetes, Type 2, Oral Hypoglycemic Agents  Renal/GU      Musculoskeletal   Abdominal   Peds  Hematology   Anesthesia Other Findings   Reproductive/Obstetrics                             Anesthesia Physical Anesthesia Plan  ASA: II  Anesthesia Plan: General   Post-op Pain Management:    Induction: Intravenous  PONV Risk Score and Plan: 2  Airway Management Planned: Oral ETT  Additional Equipment:   Intra-op Plan:   Post-operative Plan: Extubation in OR  Informed Consent: I have reviewed the patients History and Physical, chart, labs and discussed the procedure including the risks, benefits and alternatives for the proposed anesthesia with the patient or authorized representative who has indicated his/her understanding and acceptance.     Plan Discussed with: CRNA and Surgeon  Anesthesia Plan Comments:         Anesthesia Quick Evaluation

## 2018-05-31 NOTE — Progress Notes (Signed)
Patient stated that he hasn't voided since 2am this morning. Bladder scanned performed with >62ml noted on scanner. Patient c/o discomfort with bladder palpation. Unsuccessful attempted to void, sitting and standing at bedside. Dr Wilburn Cornelia notified. Orders received.

## 2018-05-31 NOTE — H&P (Signed)
[name] is an 67 y.o. male.   Chief Complaint: Right Neck Mass HPI: The patient presented for evaluation of a right neck mass.  Work-up including ultrasound-guided needle biopsy showed findings consistent with metastatic squamous cell carcinoma involving the right neck.  The work-up including CT scan and PET scan showed no evidence of primary.  The patient underwent right tonsillectomy, biopsy of the base of tongue and nasopharynx.  Biopsy specimen negative for primary source of metastatic disease.  Past Medical History:  Diagnosis Date  . Alcohol abuse    until age 75  . Arthritis   . Bell's palsy 05/2017  . BPH (benign prostatic hyperplasia)   . Chronic hepatitis C without hepatic coma ([place]) FOLLOWED BY INFECTOUS DISEASE DR COMER   POSITIVE ANTIBODY TEST THIS YEAR 2016--  CURRENT TX ON HARVONI PO- completeed 2016  . Chronic low back pain   . Collapsed lung    right .Marland Kitchen..15 yrs ago, fell and hit some bricks  . DDD (degenerative disc disease), lumbosacral   . Depression    no meds now- med used for bladder  . 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 ED (erectile dysfunction)   . Foley catheter in place    post lumbar surgery- cath left in throughh rehab- had 2 UTI's  . GERD (gastroesophageal reflux disease)    hx none in long time  . History of cocaine abuse ([place])    per pt quit and last used 06-03-2013  . Urinary retention     Past Surgical History:  Procedure Laterality Date  . APPENDECTOMY  1980's  . BACK SURGERY    . [place]  . CLOSED REDUCTION AND INTRAMAXILLARY FIXATION BILATERAL MENTAL FX'S/  PLACEMENT MAXILLARY STENT/ APERTURE WIRE PLACEMENT  01-04-2000  . DIRECT LARYNGOSCOPY N/A 04/21/2018   Procedure: DIRECT LARYNGOSCOPY WITH BIOPSY OF TONGUE BASE AND NASOPHARYNX;  Surgeon: Jerrell Belfast, MD;  Location: [facility];  Service: ENT;  Laterality: N/A;  Direct laryngoscopy with biopsy of tongue base and nasopharynx  . POSTERIOR  LUMBAR FUSION  03-05-2015   laminectomy /  nerve decompression--  L4 -- S1  . TONSILLECTOMY  04/21/2018   Procedure: RIGHT TONSILLECTOMY;  Surgeon: Jerrell Belfast, MD;  Location: [facility];  Service: ENT;;  . TOTAL KNEE ARTHROPLASTY Left 09/21/2013   Procedure: LEFT TOTAL KNEE ARTHROPLASTY;  Surgeon: Yvette Rack., MD;  Location: [facility];  Service: Orthopedics;  Laterality: Left;  . TRANSURETHRAL RESECTION OF PROSTATE N/A 06/16/2015   Procedure: TRANSURETHRAL RESECTION OF THE PROSTATE WITH GYRUS INSTRUMENTS;  Surgeon: Franchot Gallo, MD;  Location: [facility];  Service: Urology;  Laterality: N/A;    Family History  Problem Relation Age of Onset  . Liver cancer Paternal Grandmother   . Bone cancer Paternal Grandfather   . Stomach cancer Paternal Uncle   . Colon cancer Neg Hx   . Esophageal cancer Neg Hx   . Pancreatic cancer Neg Hx   . Inflammatory bowel disease Neg Hx   . Rectal cancer Neg Hx    Social History:  reports that he has never smoked. He has never used smokeless tobacco. He reports that he drank alcohol. He reports that he has current or past drug history. Drug: "Crack" cocaine.  Allergies: No Known Allergies  Medications Prior to Admission  Medication Sig Dispense Refill  . atorvastatin (LIPITOR) 20 MG tablet Take 20 mg by mouth daily at 6 PM.     .  gabapentin (NEURONTIN) 300 MG capsule Take 300 mg by mouth 3 (three) times daily.    Marland Kitchen ibuprofen (ADVIL,MOTRIN) 200 MG tablet Take 200 mg by mouth every 6 (six) hours as needed for mild pain.    . metFORMIN (GLUCOPHAGE) 1000 MG tablet Take 1,000 mg by mouth 2 (two) times daily with a meal.    . Naproxen Sod-diphenhydrAMINE (ALEVE PM) 220-25 MG TABS Take 2 tablets by mouth as directed. Three times weekly for sleep    . polyethylene glycol-electrolytes (NULYTELY/GOLYTELY) 420 g solution Take 4,000 mLs by mouth as directed. 4000 mL 0  . traMADol (ULTRAM) 50 MG tablet Take 50 mg by mouth every 6 (six) hours as  needed.    . sodium fluoride (PREVIDENT 5000 PLUS) 1.1 % CREA dental cream Apply cream to tooth brush. Brush teeth for 2 minutes. Spit out excess-DO NOT swallow. Repeat nightly. 1 Tube prn  . tamsulosin (FLOMAX) 0.4 MG CAPS capsule Take 1 capsule (0.4 mg total) by mouth daily. 14 capsule 0    Results for orders placed or performed during the hospital encounter of 05/31/18 (from the past 48 hour(s))  Glucose, capillary     Status: Abnormal   Collection Time: 05/31/18  9:06 AM  Result Value Ref Range   Glucose-Capillary 107 (H) 70 - 99 mg/dL   No results found.  Review of Systems  Constitutional: Negative.   HENT: Negative.   Respiratory: Negative.   Cardiovascular: Negative.     Blood pressure 132/84, pulse 87, temperature (!) 97.4 F (36.3 C), temperature source Oral, resp. rate 20, height 6\' 1"  (1.854 m), weight 98.1 kg, SpO2 96 %. Physical Exam  Constitutional: He appears well-developed and well-nourished.  Neck: Normal range of motion. Neck supple.  Cardiovascular: Normal rate.  Respiratory: Effort normal.  Lymphadenopathy:    He has cervical adenopathy.     Assessment/Plan Patient admitted for right neck dissection for removal of metastatic squamous cell carcinoma.  Risks and benefits were discussed in detail with the patient and his daughter and they understand agree with the plan for surgery which is scheduled on elective basis with postoperative admission to the hospital for observation.  Jerrell Belfast, MD 05/31/2018, 11:05 AM

## 2018-05-31 NOTE — Anesthesia Postprocedure Evaluation (Signed)
Anesthesia Post Note  Patient: [name]  Procedure(s) Performed: RADICAL MODIFIED RADICAL NECK DISSECTION (Right Neck)     Patient location during evaluation: PACU Anesthesia Type: General Level of consciousness: awake and alert Pain management: pain level controlled Vital Signs Assessment: post-procedure vital signs reviewed and stable Respiratory status: spontaneous breathing, nonlabored ventilation, respiratory function stable and patient connected to nasal cannula oxygen Cardiovascular status: blood pressure returned to baseline and stable Postop Assessment: no apparent nausea or vomiting Anesthetic complications: no    Last Vitals:  Vitals:   05/31/18 1640 05/31/18 1734  BP:  (!) 128/94  Pulse:  85  Resp:  17  Temp: (!) 36.3 C 36.6 C  SpO2:  100%    Last Pain:  Vitals:   05/31/18 1734  TempSrc: Oral  PainSc:                  Parul Porcelli DAVID

## 2018-05-31 NOTE — Op Note (Signed)
Operative Note: NECK DISSECTION  [name]: [name] record number: [id number]  Date:05/31/2018  Pre-operative Indications: 1.  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  Right superior neck mass  Postoperative Indications: Same  Surgical Procedure: Right Selective Neck Dissection including Zones II, III and IV  Anesthesia: GET  Surgeon: Delsa Bern, M.D.  Assist: Dr. Blenda Nicely  Complications: None  EBL: 50 cc   Brief History: The [name] is a 67 y.o. male with a history of right neck pain and mass in the right superior neck.  Work-up included ultrasound guided needle biopsy positive for metastatic squamous cell carcinoma.  CT scan of the neck and PET scans showed no evidence of primary lesion.  [name] underwent direct laryngoscopy with biopsy of the nasopharynx, tongue base and right tonsil without evidence of primary.  [name] evaluated by oncology and it was recommended that he undergo neck dissection and follow-up radiation therapy. Given the [name]'s history and findings I recommended that right neck dissection under general anesthesia, risks and benefits were discussed in detail with the [name] and their family. They understand and agree with our plan for surgery which is scheduled at [facility] on an elective basis.  Surgical Procedure: The [name] is brought to the operating room on 05/31/2018 and placed in supine position on the operating table. General endotracheal anesthesia was established without difficulty. When the [name] was adequately anesthetized, surgical timeout was performed and correct identification of the [name] and the surgical procedure. The [name] was positioned and prepped and draped in sterile fashion.  The [name]'s right neck was then injected with 6 cc of 1% lidocaine 1 100,000 dilution epinephrine in a subcutaneous fashion along the proposed skin incision.  After allowing adequate time for vasoconstriction  and hemostasis the procedure was begun by creating a skin incision using a #15 scalpel.  Incision was carried from the right postauricular region inferiorly along the neck and extending across the lower neck in a pre-existing skin crease.  Using Bovie electrocautery,  dissection was carried through the skin, underlying subcutaneous tissue and platysma muscle.  Subplatysmal flaps were then elevated superiorly and inferiorly to allow access to the structures of the neck.  The sternocleidomastoid muscle was identified, fascia along the lateral aspect was then elevated anteriorly and dissection was carried along the anterior and medial aspect of the sternocleidomastoid muscle from superior to inferior elevating soft tissue and lymphoid tissue anteriorly.  The jugular vein, carotid artery and vagus nerve were identified and preserved throughout the course.  In the superior lateral aspect of the dissection the spinal accessory nerve was identified and preserved, overlying soft tissue was carefully dissected and divided.  The posterior belly of the digastric muscle was identified and dissection was lateral to the digastric muscle dividing the soft tissue.  The common facial vein was identified divided and suture-ligated.  The hypoglossal nerve was identified and preserved throughout its course.  Dissection was along the inferior aspect of the submandibular space, identifying and preserving the submandibular gland.  Muscle was elevated anteriorly and soft tissue was then dissected from the anterior aspect of the submandibular triangle.  Dissection was then carried along the midline soft tissue to the level of the strap muscles.  The omohyoid muscle was then identified along the inferior aspect of the dissection, the muscle was divided and dissection was carried out deep to the sternocladomastoid muscle to the level of the supraclavicular space.  Dissection was then carried  through the fibrofatty soft tissue from posterior  to anterior in a soft tissue plane with Bovie electrocautery and blunt and sharp dissection.  The jugular vein was identified in its entire course and the overlying soft tissue was carefully elevated, venous branches were divided and suture-ligated and the soft tissue was elevated further anteriorly.  The carotid artery was identified throughout its course and the soft tissue was carefully dissected free from the carotid artery.  The vagus nerve was identified adjacent to the carotid artery and was preserved.  The entire neck dissection specimen was then removed and marked for pathology.  This was sent for gross and microscopic evaluation.  The [name]'s neck was then irrigated with saline, Valsalva maneuver was performed to check hemostasis.  Bleeding was controlled with bipolar cautery and suture ligature.  A 7 mm Blake drain was then placed in the depth of the incision and carried out through a separate stab incision in the anterior neck skin.  This was sutured with 3-0 Ethilon suture.  Incision was then closed in multiple layers beginning with reapproximation of the platysma muscle with interrupted 3-0 Vicryl.  Immediate subcutaneous tissue was closed with interrupted 4-0 Vicryl suture.  Final skin closure was achieved with surgical staples.  Wound was dressed with bacitracin ointment.   An orogastric tube was passed and stomach contents were aspirated. [name] was awakened from anesthetic and transferred from the operating room to the recovery room in stable condition. There were no complications and blood loss was minimal.   Delsa Bern, M.D. [facility] 05/31/2018

## 2018-05-31 NOTE — Transfer of Care (Signed)
Immediate Anesthesia Transfer of Care Note  Patient: [name]  Procedure(s) Performed: RADICAL MODIFIED RADICAL NECK DISSECTION (Right Neck)  Patient Location: PACU  Anesthesia Type:General  Level of Consciousness: awake, alert  and oriented  Airway & Oxygen Therapy: Patient Spontanous Breathing and Patient connected to face mask  Post-op Assessment: Report given to RN and Post -op Vital signs reviewed and stable  Post vital signs: Reviewed and stable  Last Vitals:  Vitals Value Taken Time  BP 114/74 05/31/2018  2:10 PM  Temp    Pulse 71 05/31/2018  2:12 PM  Resp 18 05/31/2018  2:12 PM  SpO2 95 % 05/31/2018  2:12 PM  Vitals shown include unvalidated device data.  Last Pain:  Vitals:   05/31/18 0946  TempSrc:   PainSc: 7       Patients Stated Pain Goal: 3 (09/73/53 2992)  Complications: No apparent anesthesia complications

## 2018-06-01 ENCOUNTER — Encounter (HOSPITAL_COMMUNITY): Payer: Self-pay | Admitting: Otolaryngology

## 2018-06-01 LAB — GLUCOSE, CAPILLARY
GLUCOSE-CAPILLARY: 121 mg/dL — AB (ref 70–99)
Glucose-Capillary: 137 mg/dL — ABNORMAL HIGH (ref 70–99)
Glucose-Capillary: 113 mg/dL — ABNORMAL HIGH (ref 70–99)
Glucose-Capillary: 96 mg/dL (ref 70–99)

## 2018-06-01 MED ORDER — ENSURE ENLIVE PO LIQD
237.0000 mL | Freq: Two times a day (BID) | ORAL | Status: DC
  Administered 2018-06-01: 237 mL via ORAL

## 2018-06-01 MED ORDER — ADULT MULTIVITAMIN W/MINERALS CH
1.0000 | ORAL_TABLET | Freq: Every day | ORAL | Status: DC
  Administered 2018-06-01 – 2018-06-05 (×5): 1 via ORAL
  Filled 2018-06-01 (×5): qty 1

## 2018-06-01 MED ORDER — GLUCERNA SHAKE PO LIQD
237.0000 mL | Freq: Three times a day (TID) | ORAL | Status: DC
  Administered 2018-06-01 – 2018-06-05 (×13): 237 mL via ORAL

## 2018-06-01 NOTE — Evaluation (Signed)
Physical Therapy Evaluation Patient Details Name: [name] MRN: [id number] DOB: 1951-05-08 Today's Date: 06/01/2018   History of Present Illness  Pt is a 67 y/o male admitted for removal of R neck carcinoma.  No significant PMH  Clinical Impression  Pt currently performing all mobility independently.  Ambulates 600' with no dizziness/LOB/rest breaks.  No skilled PT needs at this time.  Will sign off.     Follow Up Recommendations No PT follow up    Equipment Recommendations  None recommended by PT    Recommendations for Other Services       Precautions / Restrictions Precautions Precautions: None Restrictions Weight Bearing Restrictions: No      Mobility  Bed Mobility Overal bed mobility: Independent                Transfers Overall transfer level: Independent                  Ambulation/Gait Ambulation/Gait assistance: Independent Gait Distance (Feet): 600 Feet Assistive device: IV Pole Gait Pattern/deviations: WFL(Within Functional Limits)        Stairs            Wheelchair Mobility    Modified Rankin (Stroke Patients Only)       Balance Overall balance assessment: Independent                                           Pertinent Vitals/Pain Pain Assessment: 0-10 Pain Score: 9  Pain Location: R neck Pain Descriptors / Indicators: Aching Pain Intervention(s): Limited activity within patient's tolerance;Monitored during session;Ice applied    Home Living Family/patient expects to be discharged to:: Private residence Living Arrangements: Alone Available Help at Discharge: Family;Friend(s);Available PRN/intermittently Type of Home: House Home Access: Level entry     Home Layout: One level Home Equipment: None      Prior Function Level of Independence: Independent               Hand Dominance        Extremity/Trunk Assessment        Lower Extremity Assessment Lower Extremity  Assessment: Overall WFL for tasks assessed    Cervical / Trunk Assessment Cervical / Trunk Assessment: Normal  Communication   Communication: No difficulties  Cognition Arousal/Alertness: Awake/alert Behavior During Therapy: WFL for tasks assessed/performed Overall Cognitive Status: Within Functional Limits for tasks assessed                                        General Comments      Exercises     Assessment/Plan    PT Assessment Patent does not need any further PT services  PT Problem List         PT Treatment Interventions      PT Goals (Current goals can be found in the Care Plan section)  Acute Rehab PT Goals Patient Stated Goal: none stated this session PT Goal Formulation: With patient    Frequency     Barriers to discharge        Co-evaluation               AM-PAC PT "6 Clicks" Daily Activity  Outcome Measure Difficulty turning over in bed (including adjusting bedclothes, sheets and blankets)?: None Difficulty moving from lying on  back to sitting on the side of the bed? : None Difficulty sitting down on and standing up from a chair with arms (e.g., wheelchair, bedside commode, etc,.)?: None Help needed moving to and from a bed to chair (including a wheelchair)?: None Help needed walking in hospital room?: None Help needed climbing 3-5 steps with a railing? : None 6 Click Score: 24    End of Session   Activity Tolerance: Patient tolerated treatment well Patient left: in bed;with call bell/phone within reach Nurse Communication: Mobility status      Time: 1914-7829 PT Time Calculation (min) (ACUTE ONLY): 20 min   Charges:   PT Evaluation $PT Eval Low Complexity: 1 Low            Michel Santee 06/01/2018, 11:00 AM

## 2018-06-01 NOTE — Care Management Note (Addendum)
Case Management Note  Patient Details  Name: [name] MRN: [id number] Date of Birth: 08-Jul-1951  Subjective/Objective:                    Action/Plan:  Discussed discharge planning with patient at bedside. Patient states he lives at " a house for people with drug history" and he is not allowed back after surgery because there is no nurses there. Patient states every time he is in the hospital his [facility] and Medicaid pay for him to go to a SNF for " a couple of weeks".    Explained to patient we would need insurance authorization and he would need a skillable  need. Will order PT/OT evals. But if insurance denies him he would need to stay with a friend / family member. Patient states the only family he has is his daughter and his son in law will not allow him to stay there due to his drug history.   Patient is calling his case Freight forwarder at [facility].    NCM offered to call the Director of where he lives ( patient unable to provide name), and explain patient has staples ( no dressing change) and JP ( that may be removed prior to discharge or if not patient could manage on his own). Patient does not know [name] Cabin crew ) phone number , however he is attempting to call [name] who works where he lives , [name] has directior's number . Patient has NCM direct cell phone to give to [name] to call to discuss post discharge care.  Explained if patient goes to SNF using his Medicaid , he will have to agree to stay for 30 days and sign over his monthly cheque.  Patient called NCM , Miss [name] from his home will be home " sometime this afternoon" and wants to speak to [facility] or bedside nurse to discuss post op care to see if patient can return. Again patient has my direct cell phone number and bedside nurse also aware.  Expected Discharge Date:                  Expected Discharge Plan:     In-House Referral:     Discharge planning Services  CM Consult  Post Acute Care  Choice:    Choice offered to:     DME Arranged:    DME Agency:     HH Arranged:    HH Agency:     Status of Service:  In process, will continue to follow  If discussed at Long Length of Stay Meetings, dates discussed:    Additional Comments:  Marilu Favre, RN 06/01/2018, 9:56 AM

## 2018-06-01 NOTE — Progress Notes (Signed)
Initial Nutrition Assessment  DOCUMENTATION CODES:   Not applicable  INTERVENTION:   -Downgrade diet to dysphagia 3 (advanced mechanical soft) for ease of intake -Glucerna Shake po TID, each supplement provides 220 kcal and 10 grams of protein -MVI with minerals daily  NUTRITION DIAGNOSIS:   Increased nutrient needs related to post-op healing as evidenced by estimated needs.  GOAL:   Patient will meet greater than or equal to 90% of their needs  MONITOR:   PO intake, Supplement acceptance, Labs, Weight trends, Skin, I & O's  REASON FOR ASSESSMENT:   Malnutrition Screening Tool    ASSESSMENT:   The patient presented for evaluation of a right neck mass.  Work-up including ultrasound-guided needle biopsy showed findings consistent with metastatic squamous cell carcinoma involving the right neck.  The work-up including CT scan and PET scan showed no evidence of primary.  The patient underwent right tonsillectomy, biopsy of the base of tongue and nasopharynx.  Biopsy specimen negative for primary source of metastatic disease.  Pt admitted with rt neck mass.   10/30- s/p rt selective neck dissection  Spoke with pt at bedside, who reports he usually has a great appetite- consuming approximately 3 meals per day. 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 Pt reports he tries to eat healthfully. Over the past 6 weeks, pt has had decreased oral intake related to pain and discomfort when swallowing. He has modified his diet to softer texture food items such as sandwiches, chopped BBQ, and pudding/ice cream. He also started consuming 1-2 Boost supplements daily approximately two weeks ago.   Pt reports improved pain since surgery, but still feels discomfort and mucous on the right side. Pt reports consuming most of his breakfast, but was unable tot eat his biscuit.   He reports UBW around 220# and estimates a 10-12# wt loss over the past month, however, this is not  consistent with wt hx.   Discussed importance of good meal and supplement intake to promote healing. Pt amenable to Glucerna supplements and downgrading diet texture for ease of intake. Encouraged pt to continue supplements at home.   Last Hgb A1c: 6.3  (04/17/18). PTA DM medications are 1000 mg metformin BID. Discussed DM control at home. He reports CBGS are usually around 130's. He has been working on food choices at home to assist with improved glycemic control.   Labs reviewed: CBGS: 119-156 (inpatient orders for glycemic control are 0-9 units insulin aspart TID with meals and 1000 mg metformin BID).   NUTRITION - FOCUSED PHYSICAL EXAM:    Most Recent Value  Orbital Region  No depletion  Upper Arm Region  No depletion  Thoracic and Lumbar Region  No depletion  Buccal Region  No depletion  Temple Region  No depletion  Clavicle Bone Region  No depletion  Clavicle and Acromion Bone Region  No depletion  Scapular Bone Region  No depletion  Dorsal Hand  No depletion  Patellar Region  No depletion  Anterior Thigh Region  No depletion  Posterior Calf Region  No depletion  Edema (RD Assessment)  None  Hair  Reviewed  Eyes  Reviewed  Mouth  Reviewed  Skin  Reviewed  Nails  Reviewed       Diet Order:   Diet Order            DIET DYS 3 Room service appropriate? Yes; Fluid consistency: Thin  Diet effective now  EDUCATION NEEDS:   Education needs have been addressed  Skin:  Skin Assessment: Reviewed RN Assessment  Last BM:  05/30/18  Height:   Ht Readings from Last 1 Encounters:  05/31/18 6\' 1"  (1.854 m)    Weight:   Wt Readings from Last 1 Encounters:  05/31/18 101.9 kg    Ideal Body Weight:  83.6 kg  BMI:  Body mass index is 29.64 kg/m.  Estimated Nutritional Needs:   Kcal:  2300-2500  Protein:  115-130 grams  Fluid:  2.3-2.5 L    Akia Montalban A. Jimmye Norman, RD, LDN, CDE Pager: [phone] After hours Pager: [phone]

## 2018-06-01 NOTE — Progress Notes (Signed)
   ENT Progress Note: POD #1 s/p Procedure(s): RADICAL MODIFIED RADICAL NECK DISSECTION   Subjective: Stable with moderate pain  Objective: Vital signs in last 24 hours: Temp:  [97.3 F (36.3 C)-98.4 F (36.9 C)] 97.9 F (36.6 C) (10/31 0452) Pulse Rate:  [69-93] 84 (10/31 0452) Resp:  [14-17] 16 (10/31 0452) BP: (114-154)/(74-94) 154/83 (10/31 0452) SpO2:  [95 %-100 %] 96 % (10/31 0452) Weight:  [101.9 kg] 101.9 kg (10/30 1718) Weight change:  Last BM Date: 05/30/18  Intake/Output from previous day: 10/30 0701 - 10/31 0700 In: 2524.2 [P.O.:600; I.V.:1924.2] Out: 2175 [Urine:2050; Drains:75; Blood:50] Intake/Output this shift: Total I/O In: 74 [P.O.:470] Out: 530 [Urine:500; Drains:30]  Labs: Recent Labs    05/31/18 1946  WBC 7.2  HGB 14.6  HCT 44.6  PLT 240   No results for input(s): NA, K, CL, CO2, GLUCOSE, BUN, CALCIUM in the last 72 hours.  Invalid input(s): CREATININR  Studies/Results: No results found.   PHYSICAL EXAM: Inc intact - no erythema or swelling JP  at 30cc/shift   Assessment/Plan: Cont care Monitor JP -  Anticipate removal 11/1 or 11/2 Simple postop wound care Appreciate Case Management input    Jerrell Belfast 06/01/2018, 11:34 AM

## 2018-06-02 ENCOUNTER — Telehealth: Payer: Self-pay | Admitting: *Deleted

## 2018-06-02 ENCOUNTER — Inpatient Hospital Stay (HOSPITAL_COMMUNITY): Admission: RE | Admit: 2018-06-02 | Payer: Medicare Other | Source: Ambulatory Visit

## 2018-06-02 LAB — GLUCOSE, CAPILLARY
GLUCOSE-CAPILLARY: 83 mg/dL (ref 70–99)
GLUCOSE-CAPILLARY: 80 mg/dL (ref 70–99)
Glucose-Capillary: 92 mg/dL (ref 70–99)
Glucose-Capillary: 122 mg/dL — ABNORMAL HIGH (ref 70–99)

## 2018-06-02 NOTE — Care Management Note (Signed)
Case Management Note  Patient Details  Name: [name] MRN: [id number] Date of Birth: September 18, 1950  Subjective/Objective:                    Action/Plan:  See yesterday's note. Per patient he can return to his home.   Expected Discharge Date:                  Expected Discharge Plan:  Home/Self Care  In-House Referral:     Discharge planning Services  CM Consult  Post Acute Care Choice:  NA Choice offered to:  Patient  DME Arranged:  N/A DME Agency:  NA  HH Arranged:  NA HH Agency:     Status of Service:  Completed, signed off  If discussed at [place] of Stay Meetings, dates discussed:    Additional Comments:  Marilu Favre, RN 06/02/2018, 12:02 PM

## 2018-06-02 NOTE — Progress Notes (Signed)
OT NOTE  Pt complains of trouble swallowing and water feeling like its "stuck" "it goes half way and then gets stuck. It is worse than yesterday."  Pt observed swallowing pills this morning and required half a cup of water and x4 attempts to report "okay its gone." pt with facial grimace during task. No coughing or choking noted.   OT formal evaluation to follow   Jeri Modena, OTR/L  Acute Rehabilitation Services Pager: [phone] Office: [phone] .

## 2018-06-02 NOTE — Plan of Care (Signed)
?  Problem: Clinical Measurements: ?Goal: Will remain free from infection ?Outcome: Progressing ?  ?Problem: Nutrition: ?Goal: Adequate nutrition will be maintained ?Outcome: Progressing ?  ?Problem: Pain Managment: ?Goal: General experience of comfort will improve ?Outcome: Progressing ?  ?

## 2018-06-02 NOTE — Telephone Encounter (Signed)
Received phone call from Dr. Arnoldo Morale and they have made this patient an appt. with Dr. Arnoldo Morale on Nov. 12

## 2018-06-02 NOTE — Progress Notes (Signed)
   ENT Progress Note: POD #2 s/p Procedure(s): RADICAL MODIFIED RADICAL NECK DISSECTION   Subjective: C/O mod pain and dif swallowing  Objective: Vital signs in last 24 hours: Temp:  [98.2 F (36.8 C)-98.4 F (36.9 C)] 98.4 F (36.9 C) (11/01 0522) Pulse Rate:  [76-95] 76 (11/01 0522) Resp:  [16-18] 18 (11/01 0522) BP: (109-132)/(67-77) 109/67 (11/01 0522) SpO2:  [95 %-97 %] 96 % (11/01 0522) Weight change:  Last BM Date: 05/30/18  Intake/Output from previous day: 10/31 0701 - 11/01 0700 In: 470 [P.O.:470] Out: 1870 [Urine:1800; Drains:70] Intake/Output this shift: No intake/output data recorded.  Labs: Recent Labs    05/31/18 1946  WBC 7.2  HGB 14.6  HCT 44.6  PLT 240   No results for input(s): NA, K, CL, CO2, GLUCOSE, BUN, CALCIUM in the last 72 hours.  Invalid input(s): CREATININR  Studies/Results: No results found.   PHYSICAL EXAM: Inc intact  No sweliing or erythema  JP uptpt 70cc x 24 hrs   Assessment/Plan: Cont current care Monitor JP -  Plan to remove 11/2 or 11/3 Cont po as tolerated -  BS stable Plan d/c11/4?    Jerrell Belfast 06/02/2018, 10:39 AM

## 2018-06-02 NOTE — Telephone Encounter (Signed)
Called Dr. Arnoldo Morale to give additional info. In order to make this patient an appt. with Dr.Jenkins, spoke with [facility] and gave her the info and she will talk to Dr. Arnoldo Morale and follow Dr. Arnoldo Morale instructions regarding making this appt.

## 2018-06-02 NOTE — Evaluation (Addendum)
Occupational Therapy Evaluation Patient Details Name: [name] MRN: [id number] DOB: [birth year] Today's Date: 06/02/2018    History of Present Illness Pt is a 67 y/o male admitted for removal of R neck carcinoma.  No significant PMH   Clinical Impression   Patient evaluated by Occupational Therapy with no further acute OT needs identified. All education has been completed and the patient has no further questions. See below for any follow-up Occupational Therapy or equipment needs. OT to sign off. Thank you for referral.      Follow Up Recommendations  No OT follow up    Equipment Recommendations  None recommended by OT    Recommendations for Other Services       Precautions / Restrictions Precautions Precautions: None Precaution Comments: JP drain present      Mobility Bed Mobility Overal bed mobility: Independent                Transfers Overall transfer level: Independent                    Balance                                           ADL either performed or assessed with clinical judgement   ADL Overall ADL's : Independent                                       General ADL Comments: pt reports bathing and dressing this morning. pt educated on washing around incision and use of clean linen to decr infection risk. pt reporting trouble swalling and message sent to MD regarding concerns. Pt with transfer to simulate home level with reports of dizziness but resolved with sitting. RN informed of swalling as well     Vision         Perception     Praxis      Pertinent Vitals/Pain Pain Intervention(s): RN gave pain meds during session     Hand Dominance Right   Extremity/Trunk Assessment Upper Extremity Assessment Upper Extremity Assessment: Overall WFL for tasks assessed   Lower Extremity Assessment Lower Extremity Assessment: Defer to PT evaluation   Cervical / Trunk Assessment Cervical / Trunk  Assessment: Normal   Communication Communication Communication: No difficulties   Cognition Arousal/Alertness: Awake/alert Behavior During Therapy: WFL for tasks assessed/performed Overall Cognitive Status: Within Functional Limits for tasks assessed                                     General Comments  JP drain secured to patients shirt and currently draining    Exercises     Shoulder Instructions      Home Living Family/patient expects to be discharged to:: Unsure Living Arrangements: Alone Available Help at Discharge: Family;Friend(s);Available PRN/intermittently Type of Home: House Home Access: Level entry     Home Layout: One level               Home Equipment: None   Additional Comments: reports he is between housing       Prior Functioning/Environment Level of Independence: Independent                 OT Problem  List:        OT Treatment/Interventions:      OT Goals(Current goals can be found in the care plan section) Acute Rehab OT Goals Patient Stated Goal: none stated  OT Frequency:     Barriers to D/C:            Co-evaluation              AM-PAC PT "6 Clicks" Daily Activity     Outcome Measure Help from another person eating meals?: None Help from another person taking care of personal grooming?: None Help from another person toileting, which includes using toliet, bedpan, or urinal?: None Help from another person bathing (including washing, rinsing, drying)?: None Help from another person to put on and taking off regular upper body clothing?: None Help from another person to put on and taking off regular lower body clothing?: None 6 Click Score: 24   End of Session Nurse Communication: Mobility status;Precautions  Activity Tolerance: Patient tolerated treatment well Patient left: in chair;with call bell/phone within reach  OT Visit Diagnosis: Unsteadiness on feet (R26.81)                Time:  9892-1194 OT Time Calculation (min): 17 min Charges:  OT General Charges $OT Visit: 1 Visit OT Evaluation $OT Eval Low Complexity: 1 Low   Jeri Modena, OTR/L  Acute Rehabilitation Services Pager: [phone] Office: [phone] .   Parke Poisson B 06/02/2018, 9:44 AM

## 2018-06-03 LAB — GLUCOSE, CAPILLARY
GLUCOSE-CAPILLARY: 98 mg/dL (ref 70–99)
GLUCOSE-CAPILLARY: 110 mg/dL — AB (ref 70–99)
Glucose-Capillary: 89 mg/dL (ref 70–99)
Glucose-Capillary: 80 mg/dL (ref 70–99)

## 2018-06-03 MED ORDER — MENTHOL 3 MG MT LOZG
1.0000 | LOZENGE | OROMUCOSAL | Status: DC | PRN
  Filled 2018-06-03: qty 9

## 2018-06-03 NOTE — Progress Notes (Signed)
   ENT Progress Note: POD #3 s/p Procedure(s): RADICAL MODIFIED RADICAL NECK DISSECTION   Subjective: Stable   Objective: Vital signs in last 24 hours: Temp:  [97.5 F (36.4 C)-98.2 F (36.8 C)] 97.5 F (36.4 C) (11/02 0456) Pulse Rate:  [70-81] 70 (11/02 0456) Resp:  [18] 18 (11/02 0456) BP: (117-128)/(70-78) 117/74 (11/02 0456) SpO2:  [97 %-99 %] 98 % (11/02 0456) Weight change:  Last BM Date: 06/02/18  Intake/Output from previous day: 11/01 0701 - 11/02 0700 In: -  Out: 660 [Urine:600; Drains:60] Intake/Output this shift: No intake/output data recorded.  Labs: Recent Labs    05/31/18 1946  WBC 7.2  HGB 14.6  HCT 44.6  PLT 240   No results for input(s): NA, K, CL, CO2, GLUCOSE, BUN, CALCIUM in the last 72 hours.  Invalid input(s): CREATININR  Studies/Results: No results found.   PHYSICAL EXAM: Incision intact JP outpt stable   Assessment/Plan: Stable postop Plan drain removal 11/3 D/C on 11/4    Jerrell Belfast 06/03/2018, 9:54 AM

## 2018-06-04 LAB — GLUCOSE, CAPILLARY
GLUCOSE-CAPILLARY: 120 mg/dL — AB (ref 70–99)
GLUCOSE-CAPILLARY: 115 mg/dL — AB (ref 70–99)
Glucose-Capillary: 87 mg/dL (ref 70–99)
Glucose-Capillary: 106 mg/dL — ABNORMAL HIGH (ref 70–99)

## 2018-06-04 NOTE — Progress Notes (Signed)
   ENT Progress Note: POD #4 s/p Procedure(s): RADICAL MODIFIED RADICAL NECK DISSECTION   Subjective: Min pain  Objective: Vital signs in last 24 hours: Temp:  [97.7 F (36.5 C)-98.4 F (36.9 C)] 98.3 F (36.8 C) (11/03 0536) Pulse Rate:  [74-81] 77 (11/03 0536) Resp:  [16-17] 16 (11/03 0536) BP: (109-127)/(69-79) 116/72 (11/03 0536) SpO2:  [97 %-100 %] 97 % (11/03 0536) Weight change:  Last BM Date: 06/02/18  Intake/Output from previous day: 11/02 0701 - 11/03 0700 In: 750 [P.O.:750] Out: 500 [Urine:500] Intake/Output this shift: No intake/output data recorded.  Labs: No results for input(s): WBC, HGB, HCT, PLT in the last 72 hours. No results for input(s): NA, K, CL, CO2, GLUCOSE, BUN, CALCIUM in the last 72 hours.  Invalid input(s): CREATININR  Studies/Results: No results found.   PHYSICAL EXAM: Inc intact - no erythema or swelling JP removed   Assessment/Plan: Pt stable  Plan d/c 11/4    Jerrell Belfast 06/04/2018, 9:04 AM

## 2018-06-05 LAB — GLUCOSE, CAPILLARY
Glucose-Capillary: 126 mg/dL — ABNORMAL HIGH (ref 70–99)
Glucose-Capillary: 93 mg/dL (ref 70–99)

## 2018-06-05 MED ORDER — HYDROCODONE-ACETAMINOPHEN 5-325 MG PO TABS: 1.0000 | ORAL_TABLET | Freq: Four times a day (QID) | ORAL | 0 refills | Status: AC | PRN

## 2018-06-05 NOTE — Plan of Care (Signed)
  Problem: Education: Goal: Knowledge of General Education information will improve Description Including pain rating scale, medication(s)/side effects and non-pharmacologic comfort measures Outcome: Progressing   Problem: Clinical Measurements: Goal: Ability to maintain clinical measurements within normal limits will improve Outcome: Progressing   Problem: Activity: Goal: Risk for activity intolerance will decrease Outcome: Progressing   

## 2018-06-05 NOTE — Progress Notes (Signed)
Patient discharged to home. Verbalizes understanding of all discharge instructions including incision care, discharge medications, and follow up MD visits.  

## 2018-06-05 NOTE — Discharge Summary (Signed)
Physician Discharge Summary  Patient ID: [name] MRN: [id number] DOB/AGE: 1951/03/25 67 y.o.  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   Patient stable. Drain removed on 11/3. Pt d/c'ed to home.  Recent vital signs:  Vitals:   06/05/18 0554 06/05/18 1338  BP: 115/76 122/86  Pulse: 76 80  Resp: 16 17  Temp: (!) 97.5 F (36.4 C) 98.1 F (36.7 C)  SpO2: 98% 100%    Recent laboratory studies:  Results for orders placed or performed during the hospital encounter of 05/31/18  Glucose, capillary  Result Value Ref Range   Glucose-Capillary 107 (H) 70 - 99 mg/dL  Glucose, capillary  Result Value Ref Range   Glucose-Capillary 111 (H) 70 - 99 mg/dL   Comment 1 Notify RN    Comment 2 Document in Chart   CBC  Result Value Ref Range   WBC 7.2 4.0 - 10.5 K/uL   RBC 5.00 4.22 - 5.81 MIL/uL   Hemoglobin 14.6 13.0 - 17.0 g/dL   HCT 44.6 39.0 - 52.0 %   MCV 89.2 80.0 - 100.0 fL   MCH 29.2 26.0 - 34.0 pg   MCHC 32.7 30.0 - 36.0 g/dL   RDW 13.2 11.5 - 15.5 %   Platelets 240 150 - 400 K/uL   nRBC 0.0 0.0 - 0.2 %  Creatinine, serum  Result Value Ref Range   Creatinine, Ser 0.96 0.61 - 1.24 mg/dL   GFR calc non Af Amer >60 >60 mL/min   GFR calc Af Amer >60 >60 mL/min  Glucose, capillary  Result Value Ref Range   Glucose-Capillary 119 (H) 70 - 99 mg/dL  Glucose, capillary  Result Value Ref Range   Glucose-Capillary 156 (H) 70 - 99 mg/dL   Glucose, capillary  Result Value Ref Range   Glucose-Capillary 137 (H) 70 - 99 mg/dL  Glucose, capillary  Result Value Ref Range   Glucose-Capillary 121 (H) 70 - 99 mg/dL  Glucose, capillary  Result Value Ref Range   Glucose-Capillary 113 (H) 70 - 99 mg/dL  Glucose, capillary  Result Value Ref Range   Glucose-Capillary 96 70 - 99 mg/dL   Comment 1 Notify RN    Comment 2 Document in Chart   Glucose, capillary  Result Value Ref Range   Glucose-Capillary 83 70 - 99 mg/dL   Comment 1 Notify RN   Glucose, capillary  Result Value Ref Range   Glucose-Capillary 92 70 - 99 mg/dL   Comment 1 Notify RN   Glucose, capillary  Result Value Ref Range   Glucose-Capillary 80 70 - 99 mg/dL   Comment 1 Notify RN   Glucose, capillary  Result Value Ref Range   Glucose-Capillary 122 (H) 70 - 99 mg/dL  Glucose, capillary  Result Value Ref Range   Glucose-Capillary 89 70 - 99 mg/dL  Glucose, capillary  Result Value Ref Range   Glucose-Capillary 98 70 - 99 mg/dL  Glucose, capillary  Result Value Ref Range   Glucose-Capillary 110 (H)  70 - 99 mg/dL  Glucose, capillary  Result Value Ref Range   Glucose-Capillary 80 70 - 99 mg/dL  Glucose, capillary  Result Value Ref Range   Glucose-Capillary 120 (H) 70 - 99 mg/dL  Glucose, capillary  Result Value Ref Range   Glucose-Capillary 87 70 - 99 mg/dL  Glucose, capillary  Result Value Ref Range   Glucose-Capillary 106 (H) 70 - 99 mg/dL  Glucose, capillary  Result Value Ref Range   Glucose-Capillary 115 (H) 70 - 99 mg/dL  Glucose, capillary  Result Value Ref Range   Glucose-Capillary 126 (H) 70 - 99 mg/dL  Glucose, capillary  Result Value Ref Range   Glucose-Capillary 93 70 - 99 mg/dL    Discharge Medications:   Allergies as of 06/05/2018   No Known Allergies     Medication List    TAKE these medications   ALEVE PM 220-25 MG Tabs Generic drug:  Naproxen Sod-diphenhydrAMINE Take 2 tablets by mouth as directed. Three times weekly for  sleep   atorvastatin 20 MG tablet Commonly known as:  LIPITOR Take 20 mg by mouth daily at 6 PM.   gabapentin 300 MG capsule Commonly known as:  NEURONTIN Take 300 mg by mouth 3 (three) times daily.   HYDROcodone-acetaminophen 5-325 MG tablet Commonly known as:  NORCO/VICODIN Take 1-2 tablets by mouth every 6 (six) hours as needed for up to 3 days for moderate pain. Alternate pain meds with Motrin 600 mg   ibuprofen 200 MG tablet Commonly known as:  ADVIL,MOTRIN Take 200 mg by mouth every 6 (six) hours as needed for mild pain.   metFORMIN 1000 MG tablet Commonly known as:  GLUCOPHAGE Take 1,000 mg by mouth 2 (two) times daily with a meal.   polyethylene glycol-electrolytes 420 g solution Commonly known as:  NuLYTELY/GoLYTELY Take 4,000 mLs by mouth as directed.   sodium fluoride 1.1 % Crea dental cream Commonly known as:  PREVIDENT 5000 PLUS Apply cream to tooth brush. Brush teeth for 2 minutes. Spit out excess-DO NOT swallow. Repeat nightly.   tamsulosin 0.4 MG Caps capsule Commonly known as:  FLOMAX Take 1 capsule (0.4 mg total) by mouth daily.   traMADol 50 MG tablet Commonly known as:  ULTRAM Take 50 mg by mouth every 6 (six) hours as needed.       Diagnostic Studies: No results found.  Disposition: Discharge disposition: 01-Home or Self Care       Discharge Instructions    Diet - low sodium heart healthy   Complete by:  As directed    Discharge instructions   Complete by:  As directed    1. Limited activity 2. Liquid and soft diet, advance as tolerated 3. May bathe and shower, keep incision dry for 3 days postop 4. Wound care - 1/2 str H2O2 and bacitracin ointment twice daily 5. Elevate Head of Bed 6. Ice compress for 24 hrs  Please contact [facility] [phone]) for any additional concerns.   Increase activity slowly   Complete by:  As directed       Follow-up Information    Jerrell Belfast, MD In 2 weeks.   Specialty:   Otolaryngology Contact information: [address] [place] [address] [phone]            Signed: Jerrell Belfast 06/05/2018, 1:44 PM

## 2018-06-05 NOTE — Progress Notes (Signed)
   ENT Progress Note: POD #5 s/p Procedure(s): RADICAL MODIFIED RADICAL NECK DISSECTION   Subjective: Stable  Objective: Vital signs in last 24 hours: Temp:  [97.5 F (36.4 C)-98.1 F (36.7 C)] 98.1 F (36.7 C) (11/04 1338) Pulse Rate:  [76-80] 80 (11/04 1338) Resp:  [16-17] 17 (11/04 1338) BP: (115-136)/(76-86) 122/86 (11/04 1338) SpO2:  [98 %-100 %] 100 % (11/04 1338) Weight change:  Last BM Date: 06/04/18  Intake/Output from previous day: 11/03 0701 - 11/04 0700 In: 1063.9 [P.O.:240; I.V.:823.9] Out: 900 [Urine:900] Intake/Output this shift: Total I/O In: 237 [P.O.:237] Out: 1350 [Urine:1350]  Labs: No results for input(s): WBC, HGB, HCT, PLT in the last 72 hours. No results for input(s): NA, K, CL, CO2, GLUCOSE, BUN, CALCIUM in the last 72 hours.  Invalid input(s): CREATININR  Studies/Results: No results found.   PHYSICAL EXAM: Inc intact    Assessment/Plan: D/C to [place] 06/05/2018, 1:41 PM

## 2018-06-05 NOTE — Care Management Important Message (Signed)
Important Message  Patient Details  Name: [name] MRN: [id number] Date of Birth: [birth year]   Medicare Important Message Given:  Yes    [name] 06/05/2018, 4:06 PM

## 2018-06-06 ENCOUNTER — Emergency Department (HOSPITAL_COMMUNITY)
Admission: EM | Admit: 2018-06-06 | Discharge: 2018-06-06 | Disposition: A | Discharge status: Home / Self Care | Payer: Medicare HMO | Attending: Emergency Medicine | Admitting: Emergency Medicine

## 2018-06-06 ENCOUNTER — Encounter (HOSPITAL_COMMUNITY): Payer: Self-pay

## 2018-06-06 ENCOUNTER — Emergency Department (HOSPITAL_COMMUNITY): Payer: Medicare HMO

## 2018-06-06 DIAGNOSIS — M542 Cervicalgia: Secondary | ICD-10-CM | POA: Insufficient documentation

## 2018-06-06 DIAGNOSIS — R0602 Shortness of breath: Secondary | ICD-10-CM | POA: Diagnosis not present

## 2018-06-06 DIAGNOSIS — Z79899 Other long term (current) drug therapy: Secondary | ICD-10-CM | POA: Insufficient documentation

## 2018-06-06 DIAGNOSIS — Z7984 Long term (current) use of oral hypoglycemic drugs: Secondary | ICD-10-CM | POA: Insufficient documentation

## 2018-06-06 DIAGNOSIS — R55 Syncope and collapse: Secondary | ICD-10-CM | POA: Diagnosis not present

## 2018-06-06 DIAGNOSIS — E119 Type 2 diabetes mellitus without complications: Secondary | ICD-10-CM | POA: Diagnosis not present

## 2018-06-06 LAB — BASIC METABOLIC PANEL
ANION GAP: 10 (ref 5–15)
BUN: 10 mg/dL (ref 8–23)
CALCIUM: 10.3 mg/dL (ref 8.9–10.3)
CO2: 26 mmol/L (ref 22–32)
CREATININE: 1.04 mg/dL (ref 0.61–1.24)
Chloride: 99 mmol/L (ref 98–111)
GFR calc Af Amer: >60 mL/min (ref >60)
GLUCOSE: 130 mg/dL — AB (ref 70–99)
Potassium: 5.1 mmol/L (ref 3.5–5.1)
Sodium: 135 mmol/L (ref 135–145)

## 2018-06-06 LAB — CBC
HCT: 48.5 % (ref 39.0–52.0)
Hemoglobin: 15.6 g/dL (ref 13.0–17.0)
MCH: 29.1 pg (ref 26.0–34.0)
MCHC: 32.2 g/dL (ref 30.0–36.0)
MCV: 90.5 fL (ref 80.0–100.0)
PLATELETS: 265 10*3/uL (ref 150–400)
RBC: 5.36 MIL/uL (ref 4.22–5.81)
RDW: 13.3 % (ref 11.5–15.5)
WBC: 8.1 10*3/uL (ref 4.0–10.5)
nRBC: 0 % (ref 0.0–0.2)

## 2018-06-06 LAB — TROPONIN I: Troponin I: <0.03 ng/mL (ref <0.03)

## 2018-06-06 MED ORDER — LIDOCAINE VISCOUS HCL 2 % MT SOLN
15.0000 mL | Freq: Once | OROMUCOSAL | Status: AC
  Administered 2018-06-06: 15 mL via OROMUCOSAL
  Filled 2018-06-06: qty 15

## 2018-06-06 MED ORDER — SODIUM CHLORIDE 0.9 % IV BOLUS
1000.0000 mL | Freq: Once | INTRAVENOUS | Status: AC
  Administered 2018-06-06: 1000 mL via INTRAVENOUS

## 2018-06-06 MED ORDER — IOPAMIDOL (ISOVUE-370) INJECTION 76%
100.0000 mL | Freq: Once | INTRAVENOUS | Status: AC | PRN
  Administered 2018-06-06: 100 mL via INTRAVENOUS

## 2018-06-06 MED ORDER — MORPHINE SULFATE (PF) 4 MG/ML IV SOLN
4.0000 mg | Freq: Once | INTRAVENOUS | Status: AC
  Administered 2018-06-06: 4 mg via INTRAVENOUS
  Filled 2018-06-06: qty 1

## 2018-06-06 MED ORDER — SODIUM CHLORIDE 0.9 % IV BOLUS: 500.0000 mL | Freq: Once | INTRAVENOUS | Status: DC

## 2018-06-06 NOTE — ED Triage Notes (Signed)
Pt from [employer] via EMS; pt walked from bus stop (approx) 5 yards (last thing he remembers); per staff on arrival, pt sweating profusely w/ thready pulse; pt then had a syncopal episode lasting approx 2 minutes; pt placed on NRB upon EMS arrival; pt became responsive after about 5 minutes  Pt discharged from hospital yesterday where he had a malignant lesion removed from R neck; pt c/o pain in neck at sx site; pt also c/o difficulty swallowing  12 lead unremarkable w/ ems; Hx borderline diabetes, HTN, cancer  CBG 233

## 2018-06-06 NOTE — ED Notes (Signed)
Patient verbalizes understanding of discharge instructions. Opportunity for questioning and answers were provided. Armband removed by staff, pt discharged from ED in wheelchair.  

## 2018-06-06 NOTE — Discharge Instructions (Addendum)
Your work up has been overall reassuring in the emergency department today.  Continue take your pain medication at home as instructed by your ENT doctor.  Please follow-up with them.  If you have further episodes of your symptoms, worsening pain, not able to swallow return to the ED immediately.

## 2018-06-06 NOTE — ED Provider Notes (Signed)
[facility] EMERGENCY DEPARTMENT Provider Note   CSN: [id number] Arrival date & time: 06/06/18  1323     History   Chief Complaint Chief Complaint  Patient presents with  . Loss of Consciousness    HPI [name] is a 67 y.o. male.  HPI 67 year old African-American male past medical history significant for hepatitis C, GERD, polysubstance abuse, alcohol abuse, squamous cell carcinoma of the head and neck presents to the emergency department today for evaluation of right-sided neck pain along with syncope, left-sided chest cramping and full body cramping.  Patient states that he was discharged from the hospital yesterday after weeks day after having a radical right-sided neck dissection secondary to squamous cell carcinoma of the head neck.  Patient did have drain in place however this was taken out on 11/3.  Patient reports since then he has had increasing swelling to the right side of his neck and difficulty swallowing.  States that he feels like it is swollen and painful to swallow.  Patient reports having cramping in his neck bilaterally.  Patient does have a friend at bedside who states that he came into a doorway today and had a "vague look on his face".  States that she sat patient in the chair and patient had a brief episode of loss of consciousness.  Patient was altered until arrival to the ED.  Patient is alert oriented x3 at this time.  He does not remember riding in the EMS to the ED.  Patient reports having full body muscle cramps however he does report having some cramping to the left side of his chest wall.  Patient denies any associated shortness of breath.  He denies any history of heart disease or PE.  No history of DVT/prolonged immobilization, unilateral leg swelling, tobacco use, hormone use.  Patient has not taken anything today for his pain prior to arrival.  Nothing makes better.  Patient reports some mild nausea but denies any emesis.  Denies any  abdominal pain, diarrhea or urinary symptoms.  Denies any known fevers or chills.  Reports lightheadedness and dizziness.  States this is worse with movement.  Patient states that he has not had anything to eat or drink today and had minimal p.o. intake yesterday.  Reports a generalized headache today.  Pt denies any fever, chill, ha, vision changes,  congestion, neck pain,  sob, cough, abd pain, , urinary symptoms, change in bowel habits, melena, hematochezia, lower extremity paresthesias.  Past Medical History:  Diagnosis Date  . Alcohol abuse    until age 89  . Arthritis   . Bell's palsy 05/2017  . BPH (benign prostatic hyperplasia)   . Chronic hepatitis C without hepatic coma ([place]) FOLLOWED BY INFECTOUS DISEASE DR COMER   POSITIVE ANTIBODY TEST THIS YEAR 2016--  CURRENT TX ON HARVONI PO- completeed 2016  . Chronic low back pain   . Collapsed lung    right .Marland Kitchen..15 yrs ago, fell and hit some bricks  . DDD (degenerative disc disease), lumbosacral   . Depression    no meds now- med used for bladder  . 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 ED (erectile dysfunction)   . Foley catheter in place    post lumbar surgery- cath left in throughh rehab- had 2 UTI's  . GERD (gastroesophageal reflux disease)    hx none in long time  . History of cocaine abuse ([place])    per pt quit and last used  06-03-2013  . Urinary retention     Patient Active Problem List   Diagnosis Date Noted  . 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 Rectal bleeding 05/30/2018  . Dark stools 05/30/2018  . NSAID long-term use 05/30/2018  . Secondary malignancy of lymph nodes of head, face and neck ([place]) 05/12/2018  . 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 Cervical stenosis of spinal canal 05/10/2018  . Neck mass 04/21/2018  . Liver fibrosis 04/05/2018  . Fusion of lumbar spine 02/07/2017  . Constipation due to opioid  therapy 01/27/2017  . Lumbar stenosis with neurogenic claudication 01/24/2017  . Enlarged prostate with urinary obstruction 06/16/2015  . Lumbar degenerative disc disease 03/05/2015  . Chronic hepatitis C without hepatic coma ([place]) 12/09/2014  . Chronic low back pain 01/17/2014  . Osteoarthritis of left knee 09/21/2013  . Alcohol dependence ([place]) 06/12/2013  . Cocaine dependence ([place]) 06/12/2013  . Substance induced mood disorder ([place]) 06/12/2013    Past Surgical History:  Procedure Laterality Date  . APPENDECTOMY  1980's  . BACK SURGERY    . [place]  . CLOSED REDUCTION AND INTRAMAXILLARY FIXATION BILATERAL MENTAL FX'S/  PLACEMENT MAXILLARY STENT/ APERTURE WIRE PLACEMENT  01-04-2000  . DIRECT LARYNGOSCOPY N/A 04/21/2018   Procedure: DIRECT LARYNGOSCOPY WITH BIOPSY OF TONGUE BASE AND NASOPHARYNX;  Surgeon: Jerrell Belfast, MD;  Location: [facility];  Service: ENT;  Laterality: N/A;  Direct laryngoscopy with biopsy of tongue base and nasopharynx  . JOINT REPLACEMENT    . POSTERIOR LUMBAR FUSION  03-05-2015   laminectomy /  nerve decompression--  L4 -- S1  . RADICAL NECK DISSECTION Right 05/31/2018   Procedure: RADICAL MODIFIED RADICAL NECK DISSECTION;  Surgeon: Jerrell Belfast, MD;  Location: [facility];  Service: ENT;  Laterality: Right;  . TONSILLECTOMY  04/21/2018   Procedure: RIGHT TONSILLECTOMY;  Surgeon: Jerrell Belfast, MD;  Location: [facility];  Service: ENT;;  . TONSILLECTOMY    . TOTAL KNEE ARTHROPLASTY Left 09/21/2013   Procedure: LEFT TOTAL KNEE ARTHROPLASTY;  Surgeon: Yvette Rack., MD;  Location: [facility];  Service: Orthopedics;  Laterality: Left;  . TRANSURETHRAL RESECTION OF PROSTATE N/A 06/16/2015   Procedure: TRANSURETHRAL RESECTION OF THE PROSTATE WITH GYRUS INSTRUMENTS;  Surgeon: Franchot Gallo, MD;  Location: [facility];  Service: Urology;  Laterality: N/A;        Home Medications    Prior to Admission medications   Medication Sig  Start Date End Date Taking? Authorizing Provider  atorvastatin (LIPITOR) 20 MG tablet Take 20 mg by mouth daily at 6 PM.     [provider]  gabapentin (NEURONTIN) 300 MG capsule Take 300 mg by mouth 3 (three) times daily.    [provider]  HYDROcodone-acetaminophen (NORCO) 5-325 MG tablet Take 1-2 tablets by mouth every 6 (six) hours as needed for up to 3 days for moderate pain. Alternate pain meds with Motrin 600 mg 06/05/18 06/08/18  Jerrell Belfast, MD  ibuprofen (ADVIL,MOTRIN) 200 MG tablet Take 200 mg by mouth every 6 (six) hours as needed for mild pain.    [provider]  metFORMIN (GLUCOPHAGE) 1000 MG tablet Take 1,000 mg by mouth 2 (two) times daily with a meal.    [provider]  Naproxen Sod-diphenhydrAMINE (ALEVE PM) 220-25 MG TABS Take 2 tablets by mouth as directed. Three times weekly for sleep    [provider]  polyethylene  glycol-electrolytes (NULYTELY/GOLYTELY) 420 g solution Take 4,000 mLs by mouth as directed. 05/29/18   Mansouraty, Telford Nab., MD  sodium fluoride (PREVIDENT 5000 PLUS) 1.1 % CREA dental cream Apply cream to tooth brush. Brush teeth for 2 minutes. Spit out excess-DO NOT swallow. Repeat nightly. 05/11/18   Lenn Cal, DDS  tamsulosin (FLOMAX) 0.4 MG CAPS capsule Take 1 capsule (0.4 mg total) by mouth daily. 02/01/17   Duffy Bruce, MD  traMADol (ULTRAM) 50 MG tablet Take 50 mg by mouth every 6 (six) hours as needed.    [provider]    Family History Family History  Problem Relation Age of Onset  . Liver cancer Paternal Grandmother   . Bone cancer Paternal Grandfather   . Stomach cancer Paternal Uncle   . Colon cancer Neg Hx   . Esophageal cancer Neg Hx   . Pancreatic cancer Neg Hx   . Inflammatory bowel disease Neg Hx   . Rectal cancer Neg Hx     Social History Social History   Tobacco Use  . Smoking status: Never Smoker  . Smokeless tobacco: Never Used  Substance Use Topics  .  Alcohol use: Not Currently    Comment: occ, heavy drinker prior to age 55, completely stopped 14 months ago; none as off 05/29/18  . Drug use: Not Currently    Types: "Crack" cocaine    Comment: per pt quit and last used crack 04/16/17     Allergies   Patient has no known allergies.   Review of Systems Review of Systems  All other systems reviewed and are negative.    Physical Exam Updated Vital Signs BP 110/80   Pulse 87   Temp 98.1 F (36.7 C) (Oral)   Resp 15   Ht 6\' 1"  (1.854 m)   Wt 101.9 kg   SpO2 96%   BMI 29.64 kg/m   Physical Exam  Constitutional: He is oriented to person, place, and time. He appears well-developed and well-nourished.  Non-toxic appearance. No distress.  HENT:  Head: Normocephalic and atraumatic.  Nose: Nose normal.  Swelling to the right posterior oropharynx.  Patient is managing secretions however does have difficulty swallowing secondary to pain.  Sublingual or submandibular area are normal.  Normal phonation.  Eyes: Pupils are equal, round, and reactive to light. Conjunctivae are normal. Right eye exhibits no discharge. Left eye exhibits no discharge.  Neck: Neck supple. No JVD present. No tracheal deviation present.  Patient has a large incision to the right side of the lateral neck without any bleeding noted.mild edema without any erythema. No purulent drainage. Pain to palpation over the incision.  Cardiovascular: Normal rate, regular rhythm, normal heart sounds and intact distal pulses. Exam reveals no gallop and no friction rub.  No murmur heard. Pulmonary/Chest: Effort normal and breath sounds normal. No stridor. No respiratory distress. He has no wheezes. He has no rales. He exhibits no tenderness.  No hypoxia or tachypnea.  Abdominal: Soft. Bowel sounds are normal. He exhibits no distension. There is no tenderness. There is no rebound and no guarding.  Musculoskeletal: Normal range of motion.  No lower extremity edema or calf  tenderness.  Lymphadenopathy:    He has no cervical adenopathy.  Neurological: He is alert and oriented to person, place, and time.  Skin: Skin is warm and dry. Capillary refill takes less than 2 seconds. He is not diaphoretic.  Psychiatric: His behavior is normal. Judgment and thought content normal.  Nursing note and vitals reviewed.  ED Treatments / Results  Labs (all labs ordered are listed, but only abnormal results are displayed) Labs Reviewed  BASIC METABOLIC PANEL - Abnormal; Notable for the following components:      Result Value   Glucose, Bld 130 (*)    All other components within normal limits  CBC  TROPONIN I  URINALYSIS, ROUTINE W REFLEX MICROSCOPIC  CBG MONITORING, ED    EKG EKG Interpretation  Date/Time:  Tuesday June 06 2018 13:33:29 EST Ventricular Rate:  101 PR Interval:    QRS Duration: 83 QT Interval:  333 QTC Calculation: 432 R Axis:   59 Text Interpretation:  Sinus tachycardia Probable left atrial enlargement Artifact Confirmed by Fredia Sorrow [phone]) on 06/06/2018 1:37:28 PM   Radiology Ct Angio Chest Pe W And/or Wo Contrast  Result Date: 06/06/2018 CLINICAL DATA:  67 year old male with difficulty swelling and shortness of breath. Removal of malignant lesion from the right neck yesterday complaining of pain at the surgical site. EXAM: CT ANGIOGRAPHY CHEST WITH CONTRAST TECHNIQUE: Multidetector CT imaging of the chest was performed using the standard protocol during bolus administration of intravenous contrast. Multiplanar CT image reconstructions and MIPs were obtained to evaluate the vascular anatomy. CONTRAST:  141mL ISOVUE-370 IOPAMIDOL (ISOVUE-370) INJECTION 76% COMPARISON:  PET-CT 03/31/2018. FINDINGS: Cardiovascular: No filling defects in the pulmonary arterial tree to suggest underlying pulmonary embolism. Heart size is normal. There is no significant pericardial fluid, thickening or pericardial calcification. Aortic atherosclerosis. No  definite coronary artery calcifications. Mediastinum/Nodes: No pathologically enlarged mediastinal or hilar lymph nodes. Esophagus is unremarkable in appearance. No axillary lymphadenopathy. Lungs/Pleura: Patchy ill-defined areas of ground-glass attenuation and septal thickening noted in the dependent portions of the lower lobes of the lungs bilaterally, likely to reflect areas of mild subsegmental atelectasis. No confluent consolidative airspace disease. No pleural effusions. Mild linear scarring in the lateral segment of the right middle lobe. Upper Abdomen: Unremarkable. Musculoskeletal: There are no aggressive appearing lytic or blastic lesions noted in the visualized portions of the skeleton. Review of the MIP images confirms the above findings. IMPRESSION: 1. No evidence of pulmonary embolism. 2. No acute findings are noted in the thorax to account for the patient's symptoms. 3. Aortic atherosclerosis. Aortic Atherosclerosis (ICD10-I70.0). Electronically Signed   By: Vinnie Langton M.D.   On: 06/06/2018 18:26    Procedures Procedures (including critical care time)  Medications Ordered in ED Medications  morphine 4 MG/ML injection 4 mg (4 mg Intravenous Given 06/06/18 1530)  sodium chloride 0.9 % bolus 1,000 mL (0 mLs Intravenous Stopped 06/06/18 1722)  lidocaine (XYLOCAINE) 2 % viscous mouth solution 15 mL (15 mLs Mouth/Throat Given 06/06/18 1704)  iopamidol (ISOVUE-370) 76 % injection 100 mL (100 mLs Intravenous Contrast Given 06/06/18 1749)     Initial Impression / Assessment and Plan / ED Course  I have reviewed the triage vital signs and the nursing notes.  Pertinent labs & imaging results that were available during my care of the patient were reviewed by me and considered in my medical decision making (see chart for details).     Patient presents to the ED for evaluation of throat pain, questionable syncopal episode versus near syncope.  Patient has complex history as documented above.   Patient was initially tachycardic which has improved with fluids.  He has no significant hypoxia noted.  He is afebrile and not hypo-tensive.  Patient was noted to be mildly orthostatic initially.  Was given fluid bolus.  Lab work has been reassuring.  No leukocytosis.  Normal hemoglobin.  No significant elect light derangement.  Glucose mildly elevated at 130 with normal anion gap.  Troponin is negative.  EKG shows sinus tachycardia without any signs of acute ischemic changes and appears similar to prior tracing.  Patient is managing his secretions.  Patient does have mild right-sided neck swelling without any erythema or drainage secondary to radical neck dissection last week by ENT for screening cell carcinoma.  Submandibular and sublingual spaces are soft.  Patient is able to swallow secretions and p.o. food but states it does hurt.  He is not take any pain medication today because he has not picked it up from the pharmacy.  I did speak with Dr. Wilburn Cornelia with ENT who performed patient surgery.  He did not feel the patient needs imaging of the neck at this time.  He does have outpatient follow-up.  He felt from the ENT perspective patient could be discharged.  Given the patient is highly tachycardic on arrival I did perform CT angios of chest to rule out PE given his recent surgery.  This is reassuring not any acute findings.  Patient's orthostatics were repeated after fluids and these have improved.  He is tolerating p.o. fluids and food in the ED with some mild pain but able to manage his secretions.  He was able to ambulate in the ED without any assistance without any dizziness or lightheadedness.  Saturations maintained between 97-100%.  Patient felt that he could be discharged home with close outpatient follow-up.  Felt patient can be mildly orthostatic secondary to poor p.o. intake secondary to throat pain.  This could lead the patient's possible presyncope versus syncopal episode today.   Patient has no focal neurological deficit on exam.  Low suspicion for any CVA.  Doubt carotid dissection.  Pt is hemodynamically stable, in NAD, & able to ambulate in the ED. Evaluation does not show pathology that would require ongoing emergent intervention or inpatient treatment. I explained the diagnosis to the patient. Pain has been managed & has no complaints prior to dc. Pt is comfortable with above plan and is stable for discharge at this time. All questions were answered prior to disposition. Strict return precautions for f/u to the ED were discussed. Encouraged follow up with PCP.   Pt was seen and eval by my attending Dr. Ralene Bathe who is agreeable with the above plan.   Final Clinical Impressions(s) / ED Diagnoses   Final diagnoses:  Neck pain  Near syncope    ED Discharge Orders    None       Aaron Edelman 06/06/18 2044    Quintella Reichert, MD 06/12/18 [phone]

## 2018-06-06 NOTE — ED Notes (Signed)
CBG 134 

## 2018-06-06 NOTE — ED Notes (Signed)
Pt given crackers and diet soda per request of PA

## 2018-06-06 NOTE — ED Notes (Signed)
Paged ENT

## 2018-06-06 NOTE — ED Notes (Addendum)
Pt ambulated on pulse ox with steady gait. O2 saturation stayed between 97-100%. No complaints of dizziness/lightheadedness while ambulating.

## 2018-06-06 NOTE — ED Notes (Signed)
Pt tolerating PO fluids

## 2018-06-07 LAB — CBG MONITORING, ED: Glucose-Capillary: 134 mg/dL — ABNORMAL HIGH (ref 70–99)

## 2018-06-09 ENCOUNTER — Inpatient Hospital Stay (HOSPITAL_COMMUNITY): Admission: RE | Admit: 2018-06-09 | Payer: Medicare Other | Source: Ambulatory Visit | Admitting: Orthopedic Surgery

## 2018-06-09 ENCOUNTER — Inpatient Hospital Stay: Payer: Medicare HMO | Attending: Hematology | Admitting: Nutrition

## 2018-06-09 ENCOUNTER — Encounter (HOSPITAL_COMMUNITY): Admission: RE | Payer: Self-pay | Source: Ambulatory Visit

## 2018-06-09 SURGERY — ARTHROPLASTY, KNEE, TOTAL
Anesthesia: Choice | Site: Knee | Laterality: Right

## 2018-06-12 ENCOUNTER — Telehealth: Payer: Self-pay | Admitting: *Deleted

## 2018-06-12 NOTE — Telephone Encounter (Signed)
Oncology Nurse Navigator Documentation  Called Mr Pepperman 1) in response to call from ENT Dr. Victorio Palm medical assistant Leafy Ro indicating he reported post-operative depression during this morning's surgical follow-up, and 2) to inform him of appts being coordinated for next week.  LVMM asking him to return my call.  Gayleen Orem, RN, BSN Head & Neck Oncology Nurse [place] at [place] [phone]

## 2018-06-12 NOTE — Telephone Encounter (Signed)
Oncology Nurse Navigator Documentation  Called [name].  He acknowledged he has been feeling depressed since his 10/30 neck dissection with Dr. Wilburn Cornelia, stated he has been isolating himself from friends/family.  He noted he is feeing overwhelmed by the prospect of 6 weeks of XRT after he heals from surgery.  He recognized the need for professional support.  He accepted my offer of support through [facility], understands I will facilitate arrangements tomorrow.  He voiced understanding/appreciation.  Note routed to LCSW Gwinda Maine, this navigator to follow-up with her tomorrow morning during H&N MDC.   Gayleen Orem, RN, BSN Head & Neck Oncology Nurse [place] at [place] [phone]

## 2018-06-13 ENCOUNTER — Telehealth: Payer: Self-pay | Admitting: *Deleted

## 2018-06-13 NOTE — Telephone Encounter (Signed)
Oncology Nurse Navigator Documentation  LVMM for Mr. Favor stating 1) I had spoken with LCSW Gwinda Maine this morning, her indication she would call him today, 2) asked for call-back to schedule appt with Dr. Isidore Moos next week which will include CT SIM.  Gayleen Orem, RN, BSN Head & Neck Oncology Nurse [place] at [place] [phone]

## 2018-06-13 NOTE — Progress Notes (Signed)
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 Tongue, biopsy, right base - BENIGN SQUAMOUS MUCOSA WITH NO SPECIFIC HISTOPATHOLOGIC CHANGES. - NO EVIDENCE OF MALIGNANCY. 2. Tongue, biopsy, left base - BENIGN SQUAMOUS MUCOSA WITH NO SPECIFIC HISTOPATHOLOGIC CHANGES. - NO EVIDENCE OF MALIGNANCY. 3. Nasopharynx, biopsy, left superior - BENIGN SINONASAL MUCOSA. - NO EVIDENCE OF MALIGNANCY. 4. Nasopharynx, biopsy, right superior - BENIGN SINONASAL MUCOSA. - NO EVIDENCE OF MALIGNANCY. 5. Nasopharynx, biopsy, left inferior - BENIGN SINONASAL MUCOSA. - NO EVIDENCE OF MALIGNANCY. 6. Nasopharynx, biopsy, right inferior - BENIGN SINONASAL MUCOSA. - NO EVIDENCE OF MALIGNANCY. 7. Tonsil, right - TONSIL WITH NO SPECIFIC HISTOPATHOLOGIC CHANGES. - NO EVIDENCE OF MALIGNANCY.  05/31/18 Diagnosis Lymph nodes, radical neck dissection - METASTATIC CARCINOMA IN 1 OF 18 LYMPH NODES (1/18).  Patient presented months ago with symptoms of: He presented to the ED on 02/21/18 with a 3 week history of swelling to his right neck.   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   Nutrition Status Yes No Comments  Weight changes? []  [x]  Not since surgery  Swallowing concerns? [x]  []  He is having difficulty swallowing. He will choke at times on food. This happens often and worries him that he is nervous to eat at times.   PEG? []  [x]     Referrals Yes No Comments  Social Work? []  [x]    Dentistry? [x]  []  Dr. Enrique Sack   Swallowing therapy? []  [x]    Nutrition? []  [x]    Med/Onc? [x]  []  Dr. Maylon Peppers on 05/10/18   Safety Issues Yes No Comments  Prior radiation? []  [x]    Pacemaker/ICD? []  [x]    Possible current pregnancy? []  [x]    Is the patient on methotrexate? []  [x]     Tobacco/Marijuana/Snuff/ETOH use:  He has a history of crack cocaine abuse quitting in 2014 for a while. He then quit again in 04/2017. He does not drink alcohol or smoke cigarettes.   Past/Anticipated interventions by otolaryngology, if any:  04/21/18 Surgical Procedure:1.Direct Laryngoscopywith Biopsy 2. Nasopharyngoscopy with Biopsy 3. Right Tonsillectomy Surgeon: Delsa Bern, M.D.  05/31/18 Surgical Procedure: Right Selective Neck Dissection including Zones II, III and IV Surgeon: Delsa Bern, M.D.  Past/Anticipated interventions by medical oncology, if any:  05/10/18 Dr. Maylon Peppers, no future appointment scheduled.    Current Complaints / other details:   He is not eating well. He is drinking ensure to supplement his diet.  He is very concerned about pain management. He reports severe right leg and hip pain. He tells me that he was scheduled for right knee surgery, but it has been put on hold due to his cancer diagnosis. He would like advice on how to manage his right leg pain during cancer treatment.   BP 133/88   Pulse 96   Temp 97.8 F (36.6 C) (Oral)   Resp 16   Ht 6\' 1"  (1.854 m)   Wt 211 lb (95.7 kg)   SpO2 97%   BMI 27.84 kg/m    Wt Readings from Last 3 Encounters:  06/20/18 211 lb (95.7 kg)  06/06/18 224 lb 10.4 oz (101.9 kg)  05/31/18 224 lb 10.4 oz (101.9 kg)

## 2018-06-13 NOTE — Telephone Encounter (Signed)
Oncology Nurse Navigator Documentation  Received call-back from Mr. Hrdlicka.    He asked about re-scheduling knee surgery with Dr. Venida Jarvis d/t 10/30 neck dissection with ENT [place]].  I indicated it would be very challenging to manage 6 weeks M-F XRT if he has this surgery at this time. I suggested he have further discussion with Dr. Isidore Moos next week.  He acknowledged, said he would call Dr. Alvester Morin office for input as well.  He confirmed availability to see Dr. Isidore Moos, 06/20/18, beginning with 7:30 nurse, 8:00 Dr. Isidore Moos, 9:00 CT SIM.  He understood I will Licensed conveyancer to arrange transportation.  I updated his address per his information.  Gayleen Orem, RN, BSN Head & Neck Oncology Nurse [place] at [place] [phone]

## 2018-06-16 ENCOUNTER — Telehealth: Payer: Self-pay | Admitting: *Deleted

## 2018-06-16 ENCOUNTER — Encounter: Payer: Self-pay | Admitting: *Deleted

## 2018-06-16 NOTE — Progress Notes (Signed)
[facility] Clinical Social Work  Clinical Social Work was referred by Gayleen Orem regarding patient reporting symptoms of depression.  CSW left a voicemail requesting patient return call.      Gwinda Maine, LCSW  Clinical Social Worker [facility]

## 2018-06-16 NOTE — Telephone Encounter (Signed)
Oncology Nurse Navigator Documentation  Spoke with [name], reminded him of Monday's appts beginning with 7:30 Nurse Eval.  He confirmed understanding.  I indicated transportation has been arranged for a 7:15 pick-up.    Gayleen Orem, RN, BSN Head & Neck Oncology Nurse [place] at [place] [phone]

## 2018-06-20 ENCOUNTER — Ambulatory Visit
Admission: RE | Admit: 2018-06-20 | Discharge: 2018-06-20 | Disposition: A | Discharge status: Home / Self Care | Payer: Medicare HMO | Source: Ambulatory Visit | Attending: Radiation Oncology | Admitting: Radiation Oncology

## 2018-06-20 ENCOUNTER — Encounter: Payer: Self-pay | Admitting: *Deleted

## 2018-06-20 ENCOUNTER — Telehealth: Payer: Self-pay | Admitting: Radiation Oncology

## 2018-06-20 ENCOUNTER — Encounter: Payer: Self-pay | Admitting: Radiation Oncology

## 2018-06-20 ENCOUNTER — Other Ambulatory Visit: Payer: Self-pay

## 2018-06-20 VITALS — BP 133/88 | HR 96 | Temp 97.8°F | Resp 16 | Ht 73.0 in | Wt 211.0 lb

## 2018-06-20 DIAGNOSIS — N401 Enlarged prostate with lower urinary tract symptoms: Secondary | ICD-10-CM

## 2018-06-20 DIAGNOSIS — C770 Secondary and unspecified malignant neoplasm of lymph nodes of head, face and neck: Secondary | ICD-10-CM | POA: Insufficient documentation

## 2018-06-20 DIAGNOSIS — Z79899 Other long term (current) drug therapy: Secondary | ICD-10-CM | POA: Diagnosis not present

## 2018-06-20 DIAGNOSIS — C7989 Secondary malignant neoplasm of other specified sites: Secondary | ICD-10-CM | POA: Insufficient documentation

## 2018-06-20 DIAGNOSIS — N138 Other obstructive and reflux uropathy: Secondary | ICD-10-CM

## 2018-06-20 DIAGNOSIS — G8929 Other chronic pain: Secondary | ICD-10-CM | POA: Insufficient documentation

## 2018-06-20 NOTE — Progress Notes (Signed)
Head and Neck Cancer Simulation, IMRT treatment planning note   Outpatient  Diagnosis:    ICD-10-CM   1. 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 An Aquaplast head and shoulder mask was custom fitted to the patient's anatomy. High-resolution CT axial imaging was obtained of the head and neck without contrast. I verified that the quality of the imaging is good for treatment planning. 1 Medically Necessary Treatment Device was fabricated and supervised by me: Aquaplast mask.   Treatment planning note I plan to treat the patient with IMRT. I plan to treat the patient's oropharynx and bilateral neck nodes. I plan to treat to a total dose of 60 Gray in 30 fractions. Dose calculation was ordered from dosimetry.  IMRT planning Note  IMRT is medically necessary and an important modality to deliver adequate dose to the patient's at risk tissues while sparing the patient's normal structures, including the: esophagus, parotid tissue, mandible, brain stem, spinal cord, oral cavity, brachial plexus.  This justifies the use of IMRT in the patient's treatment.    -----------------------------------  Eppie Gibson, MD

## 2018-06-20 NOTE — Progress Notes (Signed)
Radiation Oncology         [phone] ________________________________  Name: [name] MRN: [id number]  Date: 06/20/2018  DOB: 1951-02-04  Follow-Up Visit Note  CC: Eston Esters, NP  Jerrell Belfast, MD  Diagnosis and Prior Radiotherapy:       ICD-10-CM   1. Other chronic pain G89.29 Ambulatory referral to Neurosurgery  2. 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  Since consultation, he underwent right neck dissection on 05/31/18. Final pathology revealed metastatic carcinoma in 1 of 18 lymph nodes. The positive carcinoma was in a level II lymph node, and the largest span of metastatic tumor was 1.0 cm on path report, though this probably underestimate the mass. No extracapsular extension was identified. HPV was strongly positive on 03/09/18 from his initial neck node biopsy. I don't see any mention of EBV testing in any of his pathology reports. Gayleen Orem, RN, our Head and Neck Oncology Navigator is re-requesting this.  On review of systems, the patient reports difficulty swallowing solids and will choke at times. This happens often and worries him to the point that he is nervous to eat sometimes. He is drinking Ensure to  supplement his diet. He reports urinary incontinence and erectile dysfunction issues. He reports his bowel issues have resolved since consultation. He mentions severe pain in his right hip and knee. He was going to have surgery for this with Dr. French Ana sp?, but his surgery was held secondary to his cancer diagnosis/treatment. He states that he is able to walk up stairs but cannot walk down them and has to sit down to dress himself. He reports in the past, he was able to manage his pain with Percocet. He states he almost had to be seen in behavioral health for addiction and concern about possible relapse.   ALLERGIES:  has No Known Allergies.  Meds: Current Outpatient Medications  Medication Sig Dispense Refill  . ibuprofen (ADVIL,MOTRIN) 200 MG tablet Take 200 mg by mouth every 6 (six) hours as needed for mild pain.    . metFORMIN (GLUCOPHAGE) 1000 MG tablet Take 1,000 mg by mouth 2 (two) times daily with a meal.    . Naproxen Sod-diphenhydrAMINE (ALEVE PM) 220-25 MG TABS Take 2 tablets by mouth as directed. Three times weekly for sleep    . sodium fluoride (PREVIDENT 5000 PLUS) 1.1 % CREA dental cream Apply cream to tooth brush. Brush teeth for 2 minutes. Spit out excess-DO NOT swallow. Repeat nightly. 1 Tube prn  . atorvastatin (LIPITOR) 20 MG tablet Take 20 mg by mouth daily at 6 PM.     . gabapentin (NEURONTIN) 300 MG capsule Take 300 mg by mouth 3 (three) times daily.    . polyethylene glycol-electrolytes (  NULYTELY/GOLYTELY) 420 g solution Take 4,000 mLs by mouth as directed. (Patient not taking: Reported on 06/20/2018) 4000 mL 0  . tamsulosin (FLOMAX) 0.4 MG CAPS capsule Take 1 capsule (0.4 mg total) by mouth daily. (Patient not taking: Reported on 06/20/2018) 14 capsule 0  . traMADol (ULTRAM) 50 MG tablet Take 50 mg by mouth every 6 (six) hours as needed.      No current facility-administered medications for this encounter.     Physical Findings: Wt Readings from Last 3 Encounters:    06/20/18 211 lb (95.7 kg)  06/06/18 224 lb 10.4 oz (101.9 kg)  05/31/18 224 lb 10.4 oz (101.9 kg)    height is 6\' 1"  (1.854 m) and weight is 211 lb (95.7 kg). His oral temperature is 97.8 F (36.6 C). His blood pressure is 133/88 and his pulse is 96. His respiration is 16 and oxygen saturation is 97%. .  General: Alert and oriented, in no acute distress. HEENT: Head is normocephalic. Oral cavity and oropharynx are clear. Neck: Right neck scar which is healed very nicely. No masses in the neck. Heart: Regular in rate and rhythm with no murmurs, rubs, or gallops. Chest: Clear to auscultation bilaterally, with no rhonchi, wheezes, or rales. Abdomen: Epigastric tenderness but no rigidity or guarding. Lymphatics: see Neck Exam Psychiatric: Judgment and insight are intact. Affect is appropriate.   Lab Findings: Lab Results  Component Value Date   WBC 8.1 06/06/2018   HGB 15.6 06/06/2018   HCT 48.5 06/06/2018   MCV 90.5 06/06/2018   PLT 265 06/06/2018    No results found for: TSH  Radiographic Findings: Ct Angio Chest Pe W And/or Wo Contrast  Result Date: 06/06/2018 CLINICAL DATA:  67 year old male with difficulty swelling and shortness of breath. Removal of malignant lesion from the right neck yesterday complaining of pain at the surgical site. EXAM: CT ANGIOGRAPHY CHEST WITH CONTRAST TECHNIQUE: Multidetector CT imaging of the chest was performed using the standard protocol during bolus administration of intravenous contrast. Multiplanar CT image reconstructions and MIPs were obtained to evaluate the vascular anatomy. CONTRAST:  1109mL ISOVUE-370 IOPAMIDOL (ISOVUE-370) INJECTION 76% COMPARISON:  PET-CT 03/31/2018. FINDINGS: Cardiovascular: No filling defects in the pulmonary arterial tree to suggest underlying pulmonary embolism. Heart size is normal. There is no significant pericardial fluid, thickening or pericardial calcification. Aortic atherosclerosis. No definite coronary artery  calcifications. Mediastinum/Nodes: No pathologically enlarged mediastinal or hilar lymph nodes. Esophagus is unremarkable in appearance. No axillary lymphadenopathy. Lungs/Pleura: Patchy ill-defined areas of ground-glass attenuation and septal thickening noted in the dependent portions of the lower lobes of the lungs bilaterally, likely to reflect areas of mild subsegmental atelectasis. No confluent consolidative airspace disease. No pleural effusions. Mild linear scarring in the lateral segment of the right middle lobe. Upper Abdomen: Unremarkable. Musculoskeletal: There are no aggressive appearing lytic or blastic lesions noted in the visualized portions of the skeleton. Review of the MIP images confirms the above findings. IMPRESSION: 1. No evidence of pulmonary embolism. 2. No acute findings are noted in the thorax to account for the patient's symptoms. 3. Aortic atherosclerosis. Aortic Atherosclerosis (ICD10-I70.0). Electronically Signed   By: Vinnie Langton M.D.   On: 06/06/2018 18:26    Impression/Plan:    1) Head and Neck Cancer Status: Metastatic Head and Neck Squamous cell carcinoma of Unknown Primary s/p right neck dissection. The patient will now undergo adjuvant radiotherapy. I explained that it would take place over 6 weeks, and I would treat the bilateral neck and the oropharynx.  Gayleen Orem, RN, our Head and Neck Oncology Navigator is re requesting EBV testing on tissue.  We discussed the potential risks, benefits, and side effects of radiotherapy. We talked in detail about acute and late effects. We discussed that some of the most bothersome acute effects may be mucositis, dysgeusia, salivary changes, skin irritation, hair loss, dehydration, weight loss and fatigue. We talked about late effects which include but are not necessarily limited to dysphagia, hypothyroidism, nerve injury, spinal cord injury, xerostomia, trismus, and neck edema. No guarantees of treatment were given. A consent  form has been signed and placed in the patient's medical record. The patient is enthusiastic about proceeding with treatment. I look forward to participating in the patient's care.    The patient is scheduled for CT simulation/treatment planning today. Tentatively we will plan to start his radiotherapy on July 10, 2018 to allow adequate healing.  We also discussed that the treatment of head and neck cancer is a multidisciplinary process to maximize treatment outcomes and quality of life. For this reason the following referrals have been or will be made:  1. Nutritional Status: See nutritionist for nutrition support during and after treatment.  2. Swallowing: See swallowing therapist. MBSS ordered.  3. Social work for social support.  4. Physical therapy due to risk of lymphedema in neck and deconditioning.  5. Dental: Encouraged to continue regular followup with dentistry, and dental hygiene including fluoride rinses. Patient has seen Dr. Enrique Sack and has had dentures made.  6. Other: The patient will see his neurosurgeon today for chronic spinal issues. I made a call to Dr. Clydell Hakim (also in that practice, pain specialist) regarding management of the patient's right hip and knee pain. Almost all of his pain is non-oncologic in nature right now and is complicated by his history of drug use. He is not currently using, but having a pain specialist would be helpful to account for his history of addiction. Dr. Maryjean Ka agreed to see him.  The patient will undergo colonoscopy on December 12th. H/o rectal bleeding.  For his stated history of urinary incontinence and erectile issues, I will make a referral to [facility] for early March.  I spent 25 minutes face to face with the patient and more than 50% of that time was spent in counseling and/or coordination of care. _____________________________________   Eppie Gibson, MD  This document serves as a record of services personally  performed by Eppie Gibson, MD. It was created on her behalf by Rae Lips, a trained medical scribe. The creation of this record is based on the scribe's personal observations and the provider's statements to them. This document has been checked and approved by the attending provider.

## 2018-06-20 NOTE — Telephone Encounter (Signed)
Scheduled appt per 11/19 sch message - pt is aware of nutrition appt

## 2018-06-20 NOTE — Telephone Encounter (Signed)
Attempted to call patient regarding his transportation issues this morning - couldn't leave voicemail due to the mailbox being full. Will call back at a later time and verify his address.

## 2018-06-21 ENCOUNTER — Encounter (HOSPITAL_COMMUNITY): Payer: Self-pay | Admitting: Radiation Oncology

## 2018-06-23 ENCOUNTER — Other Ambulatory Visit: Payer: Self-pay | Admitting: Neurosurgery

## 2018-06-23 ENCOUNTER — Emergency Department (HOSPITAL_COMMUNITY)
Admission: EM | Admit: 2018-06-23 | Discharge: 2018-06-24 | Disposition: A | Discharge status: Home / Self Care | Payer: Medicare HMO | Attending: Emergency Medicine | Admitting: Emergency Medicine

## 2018-06-23 ENCOUNTER — Emergency Department (HOSPITAL_COMMUNITY): Payer: Medicare HMO

## 2018-06-23 ENCOUNTER — Telehealth (HOSPITAL_COMMUNITY): Payer: Self-pay

## 2018-06-23 ENCOUNTER — Encounter (HOSPITAL_COMMUNITY): Payer: Self-pay

## 2018-06-23 DIAGNOSIS — M545 Low back pain, unspecified: Secondary | ICD-10-CM

## 2018-06-23 DIAGNOSIS — Z7984 Long term (current) use of oral hypoglycemic drugs: Secondary | ICD-10-CM | POA: Diagnosis not present

## 2018-06-23 DIAGNOSIS — Z79899 Other long term (current) drug therapy: Secondary | ICD-10-CM | POA: Diagnosis not present

## 2018-06-23 DIAGNOSIS — M4712 Other spondylosis with myelopathy, cervical region: Secondary | ICD-10-CM

## 2018-06-23 DIAGNOSIS — M25551 Pain in right hip: Secondary | ICD-10-CM | POA: Diagnosis not present

## 2018-06-23 DIAGNOSIS — Z85828 Personal history of other malignant neoplasm of skin: Secondary | ICD-10-CM | POA: Insufficient documentation

## 2018-06-23 DIAGNOSIS — E119 Type 2 diabetes mellitus without complications: Secondary | ICD-10-CM | POA: Diagnosis not present

## 2018-06-23 DIAGNOSIS — Z96652 Presence of left artificial knee joint: Secondary | ICD-10-CM | POA: Diagnosis not present

## 2018-06-23 MED ORDER — HYDROCODONE-ACETAMINOPHEN 5-325 MG PO TABS
1.0000 | ORAL_TABLET | Freq: Once | ORAL | Status: AC
  Administered 2018-06-23: 1 via ORAL
  Filled 2018-06-23: qty 1

## 2018-06-23 MED ORDER — LIDOCAINE 5 % EX PTCH: 1.0000 | MEDICATED_PATCH | CUTANEOUS | 0 refills | Status: DC

## 2018-06-23 NOTE — Discharge Instructions (Signed)
You can take Tylenol or Ibuprofen as directed for pain. You can alternate Tylenol and Ibuprofen every 4 hours. If you take Tylenol at 1pm, then you can take Ibuprofen at 5pm. Then you can take Tylenol again at 9pm.   You can use the lidocaine patches for pain.  Follow-up with referred neurosurgery for further evaluation.  Follow-up with your orthopedic doctor regarding your continued pain and to reschedule your knee surgery.  Return to the Emergency Department immediately for any worsening back pain, neck pain, difficulty walking, numbness/weaknss of your arms or legs, urinary or bowel accidents, fever or any other worsening or concerning symptoms.

## 2018-06-23 NOTE — ED Notes (Signed)
Patient transported to X-ray 

## 2018-06-23 NOTE — ED Notes (Signed)
Patient transported to CT 

## 2018-06-23 NOTE — Progress Notes (Signed)
Oncology Nurse Navigator Documentation  To provide support, encouragement and care continuity, met with [name] during appt with Dr. Isidore Moos to discuss adjuvant RT following 05/31/18 R neck dissection with ENT [name]. He voiced understanding of plan for 30 fxt RT to include oropharynx and bilateral neck nodes. We discussed his attendance at 12/10 [facility] [place] to meet with Nutrition, PT, SLP, SW and FC. He proceeded to [facility] for radiation planning, tolerated procedure without difficulty. I toured him to [employer] 2 tmt area, explained registration/arrival procedures. Escorted him to [employer], explained registration procedure at [employer].  He voiced understanding.  I encouraged him to call me with questions concerns prior to 12/9 RT start.  Gayleen Orem, RN, BSN Head & Neck Oncology Nurse [place] [facility] [phone]

## 2018-06-23 NOTE — ED Triage Notes (Signed)
Ptc/o right hip pain following a fall 4-5 days ago. Pt states the pain has gotten increasingly worse over the last several days. Pt states he is still able to ambulate, but states pain is becoming unbearable. Pt A+Ox4, VSS, in NAD.

## 2018-06-23 NOTE — ED Provider Notes (Addendum)
[facility] EMERGENCY DEPARTMENT Provider Note   CSN: [id number] Arrival date & time: 06/23/18  2043     History   Chief Complaint Chief Complaint  Patient presents with  . Hip Pain    HPI [name] is a 67 y.o. male possible history of alcohol abuse, BPH, hepatitis C, DJD, who presents for evaluation of right hip pain that is been ongoing for the last week.  He does report that he did have a fall about a week ago but states that he had had pain prior to onset of fall.  He states that he was scheduled to have a right knee replacement done but had to postpone it due to other issues.  He states he does not know if the pain is referred up from his knee.  He states he has been able to ambulate but does report worsening pain with ambulation.  He states he has not been taking any medications for the pain.  He has not noticed any overlying warmth or erythema.  Patient denies any numbness/weakness of his arms or legs, saddle anesthesia, urinary or bowel incontinence.  The history is provided by the patient.    Past Medical History:  Diagnosis Date  . Alcohol abuse    until age 46  . Arthritis   . Bell's palsy 05/2017  . BPH (benign prostatic hyperplasia)   . Chronic hepatitis C without hepatic coma ([place]) FOLLOWED BY INFECTOUS DISEASE DR COMER   POSITIVE ANTIBODY TEST THIS YEAR 2016--  CURRENT TX ON HARVONI PO- completeed 2016  . Chronic low back pain   . Collapsed lung    right .Marland Kitchen..15 yrs ago, fell and hit some bricks  . DDD (degenerative disc disease), lumbosacral   . Depression    no meds now- med used for bladder  . 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 ED (erectile dysfunction)   . Foley catheter in place    post lumbar surgery- cath left in throughh rehab- had 2 UTI's  . GERD (gastroesophageal reflux disease)    hx none in long time  . History of cocaine abuse ([place])    per pt quit and last used 06-03-2013  . Urinary retention      Patient Active Problem List   Diagnosis Date Noted  . 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 Rectal bleeding 05/30/2018  . Dark stools 05/30/2018  . NSAID long-term use 05/30/2018  . Secondary malignancy of lymph nodes of head, face and neck ([place]) 05/12/2018  . 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 Cervical stenosis of spinal canal 05/10/2018  . Neck mass 04/21/2018  . Liver fibrosis 04/05/2018  . Fusion of lumbar spine 02/07/2017  . Constipation due to opioid therapy 01/27/2017  . Lumbar stenosis with neurogenic claudication 01/24/2017  . Enlarged prostate with urinary obstruction 06/16/2015  . Lumbar degenerative disc disease 03/05/2015  . Chronic hepatitis C without hepatic coma ([place]) 12/09/2014  . Chronic low back pain 01/17/2014  . Osteoarthritis of left knee 09/21/2013  . Alcohol dependence ([place]) 06/12/2013  . Cocaine dependence ([place]) 06/12/2013  . Substance induced mood disorder ([place]) 06/12/2013    Past Surgical History:  Procedure Laterality Date  . APPENDECTOMY  1980's  . BACK SURGERY    . [place]  . CLOSED REDUCTION AND INTRAMAXILLARY FIXATION BILATERAL MENTAL FX'S/  PLACEMENT MAXILLARY STENT/ APERTURE WIRE  PLACEMENT  01-04-2000  . DIRECT LARYNGOSCOPY N/A 04/21/2018   Procedure: DIRECT LARYNGOSCOPY WITH BIOPSY OF TONGUE BASE AND NASOPHARYNX;  Surgeon: Jerrell Belfast, MD;  Location: [place];  Service: ENT;  Laterality: N/A;  Direct laryngoscopy with biopsy of tongue base and nasopharynx  . JOINT REPLACEMENT    . POSTERIOR LUMBAR FUSION  03-05-2015   laminectomy /  nerve decompression--  L4 -- S1  . RADICAL NECK DISSECTION Right 05/31/2018   Procedure: RADICAL MODIFIED RADICAL NECK DISSECTION;  Surgeon: Jerrell Belfast, MD;  Location: [facility];  Service: ENT;  Laterality: Right;  . TONSILLECTOMY  04/21/2018   Procedure: RIGHT TONSILLECTOMY;  Surgeon: Jerrell Belfast,  MD;  Location: [facility];  Service: ENT;;  . TONSILLECTOMY    . TOTAL KNEE ARTHROPLASTY Left 09/21/2013   Procedure: LEFT TOTAL KNEE ARTHROPLASTY;  Surgeon: Yvette Rack., MD;  Location: [facility];  Service: Orthopedics;  Laterality: Left;  . TRANSURETHRAL RESECTION OF PROSTATE N/A 06/16/2015   Procedure: TRANSURETHRAL RESECTION OF THE PROSTATE WITH GYRUS INSTRUMENTS;  Surgeon: Franchot Gallo, MD;  Location: [facility];  Service: Urology;  Laterality: N/A;        Home Medications    Prior to Admission medications   Medication Sig Start Date End Date Taking? Authorizing Provider  atorvastatin (LIPITOR) 20 MG tablet Take 20 mg by mouth daily at 6 PM.     [provider]  gabapentin (NEURONTIN) 300 MG capsule Take 300 mg by mouth 3 (three) times daily.    [provider]  ibuprofen (ADVIL,MOTRIN) 200 MG tablet Take 200 mg by mouth every 6 (six) hours as needed for mild pain.    [provider]  lidocaine (LIDODERM) 5 % Place 1 patch onto the skin daily. Remove & Discard patch within 12 hours or as directed by MD 06/23/18   Volanda Napoleon, PA-C  metFORMIN (GLUCOPHAGE) 1000 MG tablet Take 1,000 mg by mouth 2 (two) times daily with a meal.    [provider]  Naproxen Sod-diphenhydrAMINE (ALEVE PM) 220-25 MG TABS Take 2 tablets by mouth as directed. Three times weekly for sleep    [provider]  polyethylene glycol-electrolytes (NULYTELY/GOLYTELY) 420 g solution Take 4,000 mLs by mouth as directed. Patient not taking: Reported on 06/20/2018 05/29/18   Mansouraty, Telford Nab., MD  sodium fluoride (PREVIDENT 5000 PLUS) 1.1 % CREA dental cream Apply cream to tooth brush. Brush teeth for 2 minutes. Spit out excess-DO NOT swallow. Repeat nightly. 05/11/18   Lenn Cal, DDS  tamsulosin (FLOMAX) 0.4 MG CAPS capsule Take 1 capsule (0.4 mg total) by mouth daily. Patient not taking: Reported on 06/20/2018 02/01/17   Duffy Bruce, MD   traMADol (ULTRAM) 50 MG tablet Take 50 mg by mouth every 6 (six) hours as needed.     [provider]    Family History Family History  Problem Relation Age of Onset  . Liver cancer Paternal Grandmother   . Bone cancer Paternal Grandfather   . Stomach cancer Paternal Uncle   . Colon cancer Neg Hx   . Esophageal cancer Neg Hx   . Pancreatic cancer Neg Hx   . Inflammatory bowel disease Neg Hx   . Rectal cancer Neg Hx     Social History Social History   Tobacco Use  . Smoking status: Never Smoker  . Smokeless tobacco: Never Used  Substance Use Topics  . Alcohol use: Not Currently    Comment: occ, heavy drinker prior to age  60, completely stopped 14 months ago; none as off 05/29/18  . Drug use: Not Currently    Types: "Crack" cocaine    Comment: per pt quit and last used crack 04/16/17     Allergies   Patient has no known allergies.   Review of Systems Review of Systems  Constitutional: Negative for fever.  Musculoskeletal:       Hip pain  Skin: Negative for color change.  Neurological: Negative for weakness and numbness.  All other systems reviewed and are negative.    Physical Exam Updated Vital Signs BP 132/85   Pulse 100   Temp 98.5 F (36.9 C) (Oral)   Resp 16   Ht 6\' 1"  (1.854 m)   Wt 95.7 kg   SpO2 98%   BMI 27.84 kg/m   Physical Exam  Constitutional: He appears well-developed and well-nourished.  HENT:  Head: Normocephalic and atraumatic.  Eyes: Conjunctivae and EOM are normal. Right eye exhibits no discharge. Left eye exhibits no discharge. No scleral icterus.  Cardiovascular:  Pulses:      Dorsalis pedis pulses are 2+ on the right side, and 2+ on the left side.  Pulmonary/Chest: Effort normal.  Musculoskeletal:       Thoracic back: He exhibits no tenderness.       Back:  Diffuse muscular tenderness palpation into the paraspinal muscles of the lumbar region that extends out into the sacral and gluteal region.  Diffuse muscular  tenderness overlying the right hip that overextends over to the bone.  No deformity or crepitus noted.  Flexion/extension intact.  He does have some pain with external rotation but is able to complete both internal and external rotation without any difficulty.  No tenderness palpation noted to the right knee, right ankle.  No tenderness palpation into the left lower extremity.  Full range of motion of left lower extremity without any difficulty.  Neurological: He is alert.  5/5 strength BLE Sensation intact along major nerve distributions of BLE  Skin: Skin is warm and dry.  Psychiatric: He has a normal mood and affect. His speech is normal and behavior is normal.  Nursing note and vitals reviewed.    ED Treatments / Results  Labs (all labs ordered are listed, but only abnormal results are displayed) Labs Reviewed - No data to display  EKG None  Radiology Dg Lumbar Spine Complete  Result Date: 06/23/2018 CLINICAL DATA:  Patient slipped and fell 2 weeks ago and presents with right hip pain. EXAM: LUMBAR SPINE - COMPLETE 4+ VIEW COMPARISON:  06/17/2017 lumbar spine radiographs FINDINGS: The patient is status post posterior lumbar interbody fusion from L3 through S1 with intact hardware and interbody blocks noted. There is a new oblique lucency through the anterior superior corner of the L3 vertebral body without significant displacement suspicious for a nondisplaced fracture. Chronic right side down height loss of the L3 vertebral body is seen on the frontal view. No suspicious osseous lesions or listhesis. No bone destruction is noted. IMPRESSION: 1. A new lucency is seen through the anterior superior corner of the L3 vertebral body on the lateral views. Findings raise concern for a nondisplaced fracture. Consider CT for better assessment. 2. Intact L3 through S1 posterior lumbar interbody fusion with intact hardware. Electronically Signed   By: Ashley Royalty M.D.   On: 06/23/2018 22:19   Dg Hip  Unilat W Or Wo Pelvis 2-3 Views Right  Result Date: 06/23/2018 CLINICAL DATA:  Patient slipped 2 weeks ago and presents with  right hip pain. EXAM: DG HIP (WITH OR WITHOUT PELVIS) 2-3V RIGHT COMPARISON:  None. FINDINGS: The patient is status post posterior lumbar interbody fusion which is partially included on this study. That which is visualized is intact from at least L3 caudad to S1. No pelvic diastasis. No pelvic fracture. The femoral heads are seated within their respective acetabular components without acute fracture or malalignment. IMPRESSION: No acute osseous abnormality of the pelvis and right hip. Electronically Signed   By: Ashley Royalty M.D.   On: 06/23/2018 22:28    Procedures Procedures (including critical care time)  Medications Ordered in ED Medications  HYDROcodone-acetaminophen (NORCO/VICODIN) 5-325 MG per tablet 1 tablet (1 tablet Oral Given 06/23/18 2343)     Initial Impression / Assessment and Plan / ED Course  I have reviewed the triage vital signs and the nursing notes.  Pertinent labs & imaging results that were available during my care of the patient were reviewed by me and considered in my medical decision making (see chart for details).     67 year old male who presents for evaluation of right hip pain that has been ongoing for the last week.  He does report that he had a fall about a week ago but states he has been having some pain prior to onset of fall.  Has been able to ambulate but does report worsening pain with ambulation.  He was scheduled to have a knee replacement done on the right side but states he had to cancel the knee replacement secondary to medical issues. Patient is afebrile, non-toxic appearing, sitting comfortably on examination table. Vital signs reviewed and stable.  Patient is neurovascularly intact.  On exam, he has some tenderness palpation noted to the lower lumbar region that extends to the sacral and paraspinal muscles down to the muscular  lesion of the right hip.  No deformity or crepitus noted.  Flexion/extension intact.  He does have some pain with external and internal rotation.  We will plan for x-rays for evaluation of any acute bony abnormality.  Consider fracture versus dislocation versus musculoskeletal pain.  History/physical exam is not concerning for septic arthritis, acute arterial embolism, DVT.  X-ray of hip shows no acute bony abnormality.  Given that he has been ambulating on it, do not suspect the patient has an occult fracture that would need further imaging here in the ED.  Lumbar x-ray shows a new lucency noted to the anterior superior corner of the L3 vertebral body of the lateral views.  Question of this is a nondisplaced fracture.  We will plan for CT for further evaluation.  Patient reviewed on [place] PMP. He does have a significant narcotic history. His last prescription was on 06/06/18 for 20 hydrocodone. Prior to that he had 30 hydrocodone on 05/01/18.   Patient signed out to [place] still, PA-C with CT L-spine pending.  Please see note for further ED course.  Final Clinical Impressions(s) / ED Diagnoses   Final diagnoses:  Acute pain of right hip  Acute right-sided low back pain, unspecified whether sciatica present    ED Discharge Orders         Ordered    lidocaine (LIDODERM) 5 %  Every 24 hours     06/23/18 2355           Volanda Napoleon, PA-C 06/23/18 2356    Volanda Napoleon, PA-C 06/23/18 2358    Dorie Rank, MD 06/24/18 [phone]

## 2018-06-23 NOTE — ED Provider Notes (Signed)
One week right hip pain, worse after fall H/o cancer neck Exam = lumbar to hip tenderness Can ambulate with pain Hip x-ray neg, lumbar film new lucency CT lumbar pending to evaluate lucency H/o substance abuse - #5 Norco on the 5th [name])  Plan: review CT Anticipate discharge home, ref neurosurg  CT with acute change. The patient can be discharged home per plan of previous treatment team.    Charlann Lange, PA-C 06/24/18 0015    Dorie Rank, MD 06/24/18 [phone]

## 2018-06-24 NOTE — ED Notes (Signed)
Pt did not want to wait for last set of vitals- stated he needed to hurry up and catch the last bus.  Patient verbalizes understanding of discharge instructions. Opportunity for questioning and answers were provided. Armband removed by staff, pt discharged from ED

## 2018-06-26 ENCOUNTER — Other Ambulatory Visit (HOSPITAL_COMMUNITY): Payer: Self-pay

## 2018-06-26 ENCOUNTER — Telehealth: Payer: Self-pay | Admitting: *Deleted

## 2018-06-26 ENCOUNTER — Other Ambulatory Visit (HOSPITAL_COMMUNITY): Payer: Self-pay | Admitting: Radiation Oncology

## 2018-06-26 DIAGNOSIS — R1310 Dysphagia, unspecified: Secondary | ICD-10-CM

## 2018-06-26 NOTE — Telephone Encounter (Signed)
Oncology Nurse Navigator Documentation  Spoke with Mr. Sheils to check on status of appt to see pain specialist Dr. Maryjean Ka per Dr. Pearlie Oyster referral last week 11/19. He indicated he has not received call.   In follow-up, I called Dr. Maryjean Ka' office, LVMM with his MA Oley Balm, requesting Mr. Lolli be contacted to arrange appt ASAP d/t severe R hip pain.  Gayleen Orem, RN, BSN Head & Neck Oncology Nurse [place] at [place] [phone]

## 2018-06-27 ENCOUNTER — Ambulatory Visit: Payer: Medicare HMO | Admitting: Physical Therapy

## 2018-07-04 ENCOUNTER — Telehealth: Payer: Self-pay | Admitting: *Deleted

## 2018-07-04 ENCOUNTER — Other Ambulatory Visit (HOSPITAL_COMMUNITY): Payer: Self-pay

## 2018-07-04 NOTE — Telephone Encounter (Signed)
Oncology Nurse Navigator Documentation  Spoke with Mr. Trimpe, informed him MBS rescheduled for 12/9 11:30 at [facility], explained arrival procedure.  He voiced understanding.  Gayleen Orem, RN, BSN Head & Neck Oncology Nurse [place] at [place] [phone]

## 2018-07-04 NOTE — Telephone Encounter (Signed)
Oncology Nurse Navigator Documentation  Per patient's call yesterday morning and his indication he will be in [facility] this week attending a family funeral, LVMM with Dr. Maryjean Ka' office cancelling tomorrow's appt, cancelled tomorrow's MBSS appt.  He indicated he was going to call Dr. Maryjean Ka' office as well.  Gayleen Orem, RN, BSN Head & Neck Oncology Nurse [place] at [place] [phone]

## 2018-07-05 DIAGNOSIS — C770 Secondary and unspecified malignant neoplasm of lymph nodes of head, face and neck: Secondary | ICD-10-CM | POA: Diagnosis present

## 2018-07-07 ENCOUNTER — Encounter (HOSPITAL_COMMUNITY): Payer: Self-pay

## 2018-07-07 ENCOUNTER — Ambulatory Visit (HOSPITAL_COMMUNITY): Payer: Medicare HMO

## 2018-07-07 ENCOUNTER — Ambulatory Visit: Payer: Medicare HMO

## 2018-07-08 ENCOUNTER — Ambulatory Visit
Admission: RE | Admit: 2018-07-08 | Discharge: 2018-07-08 | Disposition: A | Discharge status: Home / Self Care | Payer: Medicare HMO | Source: Ambulatory Visit | Attending: Neurosurgery | Admitting: Neurosurgery

## 2018-07-08 DIAGNOSIS — M4712 Other spondylosis with myelopathy, cervical region: Secondary | ICD-10-CM

## 2018-07-10 ENCOUNTER — Other Ambulatory Visit: Payer: Self-pay | Admitting: Radiation Oncology

## 2018-07-10 ENCOUNTER — Ambulatory Visit (HOSPITAL_COMMUNITY)
Admission: RE | Admit: 2018-07-10 | Discharge: 2018-07-10 | Disposition: A | Discharge status: Home / Self Care | Payer: Medicare HMO | Source: Ambulatory Visit | Attending: Radiation Oncology | Admitting: Radiation Oncology

## 2018-07-10 ENCOUNTER — Ambulatory Visit (HOSPITAL_COMMUNITY): Payer: Self-pay

## 2018-07-10 ENCOUNTER — Other Ambulatory Visit (HOSPITAL_COMMUNITY): Payer: Self-pay

## 2018-07-10 ENCOUNTER — Ambulatory Visit: Admission: RE | Admit: 2018-07-10 | Payer: Medicare HMO | Source: Ambulatory Visit | Admitting: Radiation Oncology

## 2018-07-10 ENCOUNTER — Encounter: Payer: Self-pay | Admitting: Nutrition

## 2018-07-10 ENCOUNTER — Encounter: Payer: Self-pay | Admitting: *Deleted

## 2018-07-10 DIAGNOSIS — C770 Secondary and unspecified malignant neoplasm of lymph nodes of head, face and neck: Secondary | ICD-10-CM

## 2018-07-10 DIAGNOSIS — R1310 Dysphagia, unspecified: Secondary | ICD-10-CM | POA: Diagnosis present

## 2018-07-10 DIAGNOSIS — R1313 Dysphagia, pharyngeal phase: Secondary | ICD-10-CM

## 2018-07-10 NOTE — Progress Notes (Signed)
Modified Barium Swallow Progress Note  Patient Details  Name: [name] MRN: [id number] Date of Birth: Mar 13, 1951  Today's Date: 07/10/2018  Modified Barium Swallow completed.  Full report located under Chart Review in the Imaging Section.  Brief recommendations include the following:  Clinical Impression  Pt presents with a moderate pharyngeal dysphagia marked by penetration and brief aspiration with thin from effects of recent neck dissection preventing sustained laryngeal closure. Pt attempting to extend laryngeal elevation to compensate for and prolong epiglottic deflection and closure with thin penetrating to vocal cords falling below with immediate and effective throat clear. Swallow function further impacted by cervical fusion disectomy s/p 20 years likely altering function at baseline and now acute challenges post surgery. Mild vallecular and pyriform sinus residue increasing with nectar and increased effort required to transit pharynx althought he reported slighlty easier swallow with thicker liquids. Separate strategies performed with chin tuck then right head turn did not effectively prevent airway intrusion. MBS observed esophagus with stasis in distal portion (not formally diagnosed). Educated pt to consume soft, tneder meats, small bites, use caution with bread and rice. Recomend pt continue thin liquids and educated re: use of thickenUp clear if swallow worsens. He does consume Boost. a slightly thicker consistency. Unfortunately, pt's swallow function may worsen as radiation begins. Highly recommend pt initiate outpatient Speech Therapy for radiation effects on swallowing for home exercise program during and after radiation and discussed with pt.            Swallow Evaluation Recommendations       SLP Diet Recommendations: Regular solids;Thin liquid   Liquid Administration via: Cup   Medication Administration: Whole meds with puree   Supervision: Patient able to self  feed   Compensations: Slow rate;Small sips/bites;Multiple dry swallows after each bite/sip;Follow solids with liquid;Clear throat after each swallow   Postural Changes: Seated upright at 90 degrees   Oral Care Recommendations: Oral care BID        Houston Siren 07/10/2018,2:49 PM   Orbie Pyo Colvin Caroli.Ed Risk analyst [phone] Office [phone]

## 2018-07-11 ENCOUNTER — Inpatient Hospital Stay: Payer: Medicare HMO

## 2018-07-11 ENCOUNTER — Encounter: Payer: Self-pay | Admitting: Physical Therapy

## 2018-07-11 ENCOUNTER — Other Ambulatory Visit: Payer: Self-pay | Admitting: Radiation Oncology

## 2018-07-11 ENCOUNTER — Ambulatory Visit
Admission: RE | Admit: 2018-07-11 | Discharge: 2018-07-11 | Disposition: A | Discharge status: Home / Self Care | Payer: Medicare HMO | Source: Ambulatory Visit | Attending: Radiation Oncology | Admitting: Radiation Oncology

## 2018-07-11 ENCOUNTER — Ambulatory Visit: Payer: Medicare HMO | Attending: Family Medicine

## 2018-07-11 ENCOUNTER — Other Ambulatory Visit: Payer: Self-pay

## 2018-07-11 ENCOUNTER — Inpatient Hospital Stay: Payer: Medicare HMO | Attending: Hematology | Admitting: Nutrition

## 2018-07-11 ENCOUNTER — Ambulatory Visit: Payer: Medicare HMO | Admitting: Physical Therapy

## 2018-07-11 ENCOUNTER — Encounter: Payer: Self-pay | Admitting: *Deleted

## 2018-07-11 DIAGNOSIS — M542 Cervicalgia: Secondary | ICD-10-CM | POA: Diagnosis present

## 2018-07-11 DIAGNOSIS — R293 Abnormal posture: Secondary | ICD-10-CM

## 2018-07-11 DIAGNOSIS — R1312 Dysphagia, oropharyngeal phase: Secondary | ICD-10-CM | POA: Diagnosis present

## 2018-07-11 DIAGNOSIS — C7989 Secondary malignant neoplasm of other specified sites: Secondary | ICD-10-CM

## 2018-07-11 DIAGNOSIS — C770 Secondary and unspecified malignant neoplasm of lymph nodes of head, face and neck: Secondary | ICD-10-CM | POA: Diagnosis not present

## 2018-07-11 NOTE — Therapy (Addendum)
[facility] [address] [place], [place], [address] Phone: [phone]   Fax:  [phone]  Speech Language Pathology Evaluation  Patient Details  Name: [name] MRN: [id number] Date of Birth: 12/24/1950 Referring Provider (SLP): Eppie Gibson, MD   Encounter Date: 07/11/2018  End of Session - 07/11/18 1250    Visit Number  1    Number of Visits  4    Date for SLP Re-Evaluation  10/09/18   90 daya   SLP Start Time  1000    SLP Stop Time   1050    SLP Time Calculation (min)  50 min    Activity Tolerance  Patient tolerated treatment well       Past Medical History:  Diagnosis Date  . Alcohol abuse    until age 47  . Arthritis   . Bell's palsy 05/2017  . BPH (benign prostatic hyperplasia)   . Chronic hepatitis C without hepatic coma ([place]) FOLLOWED BY INFECTOUS DISEASE DR COMER   POSITIVE ANTIBODY TEST THIS YEAR 2016--  CURRENT TX ON HARVONI PO- completeed 2016  . Chronic low back pain   . Collapsed lung    right .Marland Kitchen..15 yrs ago, fell and hit some bricks  . DDD (degenerative disc disease), lumbosacral   . Depression    no meds now- med used for bladder  . 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 ED (erectile dysfunction)   . Foley catheter in place    post lumbar surgery- cath left in throughh rehab- had 2 UTI's  . GERD (gastroesophageal reflux disease)    hx none in long time  . History of cocaine abuse ([place])    per pt quit and last used 06-03-2013  . Urinary retention     Past Surgical History:  Procedure Laterality Date  . APPENDECTOMY  1980's  . BACK SURGERY    . [place]  . CLOSED REDUCTION AND INTRAMAXILLARY FIXATION BILATERAL MENTAL FX'S/  PLACEMENT MAXILLARY STENT/ APERTURE WIRE PLACEMENT  01-04-2000  . DIRECT LARYNGOSCOPY N/A 04/21/2018   Procedure: DIRECT LARYNGOSCOPY WITH BIOPSY OF TONGUE BASE AND NASOPHARYNX;  Surgeon: Jerrell Belfast, MD;   Location: [facility];  Service: ENT;  Laterality: N/A;  Direct laryngoscopy with biopsy of tongue base and nasopharynx  . JOINT REPLACEMENT    . POSTERIOR LUMBAR FUSION  03-05-2015   laminectomy /  nerve decompression--  L4 -- S1  . RADICAL NECK DISSECTION Right 05/31/2018   Procedure: RADICAL MODIFIED RADICAL NECK DISSECTION;  Surgeon: Jerrell Belfast, MD;  Location: [facility];  Service: ENT;  Laterality: Right;  . TONSILLECTOMY  04/21/2018   Procedure: RIGHT TONSILLECTOMY;  Surgeon: Jerrell Belfast, MD;  Location: [facility];  Service: ENT;;  . TONSILLECTOMY    . TOTAL KNEE ARTHROPLASTY Left 09/21/2013   Procedure: LEFT TOTAL KNEE ARTHROPLASTY;  Surgeon: Yvette Rack., MD;  Location: [facility];  Service: Orthopedics;  Laterality: Left;  . TRANSURETHRAL RESECTION OF PROSTATE N/A 06/16/2015   Procedure: TRANSURETHRAL RESECTION OF THE PROSTATE WITH GYRUS INSTRUMENTS;  Surgeon: Franchot Gallo, MD;  Location: [facility];  Service: Urology;  Laterality: N/A;    There were no vitals filed for this visit.      SLP Evaluation [facility] - 07/11/18 1219      SLP Visit Information   SLP Received On  07/11/18    Referring Provider (SLP)  Eppie Gibson, MD    Onset Date  2019    Medical Diagnosis  secondary SCCA of neck with unknown primary      Subjective   Patient/Family Stated Goal  Have WNL swallowing      General Information   HPI  Past medical history significant for right superior neck mass and biopsy showed squamous cell carcinoma of the head and underwent radical right-sided neck dissection (05/31/18), cervical discectomy surgery 1999, hepatitis C, GERD, polysubstance abuse, alcohol abuse. Pt recalls results from modified yesterday and reports them to SLP today. Pt reports frequent coughing during meals, painful swallow and increased difficulty with solids.        Prior Functional Status   Cognitive/Linguistic Baseline  Within functional limits      Pain Assessment   Pain Assessment   No/denies pain      Auditory Comprehension   Overall Auditory Comprehension  Appears within functional limits for tasks assessed      Verbal Expression   Overall Verbal Expression  Appears within functional limits for tasks assessed      Oral Motor/Sensory Function   Overall Oral Motor/Sensory Function  Appears within functional limits for tasks assessed      Motor Speech   Overall Motor Speech  Appears within functional limits for tasks assessed       Pt currently tolerates ensure, boost, soups, liquids best, however has had some softer foods as well. SLP from MBSS recommended liquids primarily. POs: Pt ate applesauce and drank H2O with pt clearing throat on 2/5 sips water. Pt benefitted from liquid wash following applesauce. Thyroid elevation appeared WNL/slightly reduced (?), and swallows appeared timely. Oral residue noted as minmal/WNL  Because data states the risk for dysphagia during and after radiation treatment is high due to undergoing radiation tx, SLP taught pt about the possibility of reduced/limited ability for PO intake during rad tx. SLP encouraged pt to continue swallowing POs as far into rad tx as possible, even ingesting POs and/or completing HEP shortly after administration of pain meds.   SLP educated pt re: changes to swallowing musculature after rad tx, and why adherence to dysphagia HEP provided today and PO consumption was necessary to inhibit muscular disuse atrophy and to reduce muscle fibrosis following rad tx. Pt demonstrated understanding of these things to SLP.    SLP then developed a HEP for pt and pt was instructed how to perform exercises involving lingual, vocal, and pharyngeal strengthening. SLP performed each exercise and pt return demonstrated each exercise. SLP ensured pt performance was correct prior to moving on to next exercise. Pt was instructed to complete this program 2-3 times a day, 6-7 days/week until 6 months after his last rad tx, then x2-3 a  week after that.                SLP Education - 07/11/18 1249    Education Details  HEP procedure, late effects head/neck radiation on swallowing, necessity to complete HEP as prescribed    Person(s) Educated  Patient    Methods  Explanation;Demonstration;Verbal cues;Handout    Comprehension  Need further instruction;Verbal cues required;Returned demonstration;Verbalized understanding       SLP Short Term Goals - 07/11/18 1239      SLP SHORT TERM GOAL #1   Title  pt will complete HEP with rare min A over two sessions    Time  2    Period  --   visits, for all STGs   Status  New      SLP SHORT TERM GOAL #  2   Title  pt will tell SLP why he is completing HEP     Time  1    Period  --   visits   Status  New      SLP SHORT TERM GOAL #3   Title  pt will tell SLP precautions from modified 07-10-18 with min questioning cues    Time  1    Period  --   vists   Status  New       SLP Long Term Goals - 07/11/18 1241      SLP LONG TERM GOAL #1   Title  pt will complete HEP with modified independence over two sessions    Time  3    Period  --   visits   Status  New      SLP LONG TERM GOAL #2   Title  pt will tell SLP 3 overt s/s of aspiration PNA with modified independence    Time  3    Period  --   visits   Status  New      SLP LONG TERM GOAL #3   Title  pt will tell SLP how a food journal can foster a quicker return to a more-normalized diet, over two visits    Time  4    Period  --   visits   [place] - 07/11/18 1219    Clinical Impression Statement  Pt with pharyngeal dysphagia ID'd yesterday via modified ([facility]SS) -see that report under "imaging" for details. Today while following precautions, pt's oropharyngeal swallowing essentially WFL/WNL for puree and water with intermittent liquid wash. However, as noted as well on modified report, the probability of swallowing difficulty increases dramatically with the initiation of radiation therapy.  Pt will need to be followed by SLP for regular assessment of accurate HEP completion as well as for safety with POs both during and following treatment/s.    Speech Therapy Frequency  --   approx once every four weeks   Duration  --   4 visits   Treatment/Interventions  Aspiration precaution training;Pharyngeal strengthening exercises;Diet toleration management by SLP;Compensatory techniques;Internal/external aids;SLP instruction and feedback;Patient/family education;Trials of upgraded texture/liquids;Cueing hierarchy;Environmental controls    Potential to Achieve Goals  Good    SLP Home Exercise Plan  provided today    Consulted and Agree with Plan of Care  Patient       Patient will benefit from skilled therapeutic intervention in order to improve the following deficits and impairments:   Dysphagia, oropharyngeal phase    Problem List Patient Active Problem List   Diagnosis Date Noted  . 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 Rectal bleeding 05/30/2018  . Dark stools 05/30/2018  . NSAID long-term use 05/30/2018  . Secondary malignancy of lymph nodes of head, face and neck ([place]) 05/12/2018  . 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 Cervical stenosis of spinal canal 05/10/2018  . Neck mass 04/21/2018  . Liver fibrosis 04/05/2018  . Fusion of lumbar spine 02/07/2017  . Constipation due to opioid therapy 01/27/2017  . Lumbar stenosis with neurogenic claudication 01/24/2017  . Enlarged prostate with urinary obstruction 06/16/2015  . Lumbar degenerative disc disease 03/05/2015  . Chronic hepatitis C without hepatic coma ([place]) 12/09/2014  . Chronic low back pain 01/17/2014  . Osteoarthritis of left knee 09/21/2013  . Alcohol dependence ([place]) 06/12/2013  .  Cocaine dependence ([place]) 06/12/2013  . Substance induced mood disorder ([place]) 06/12/2013    [place] ,[place], [place]  07/11/2018, 12:52 PM  [facility] [address] [place], [place], [address] Phone: [phone]   Fax:  [phone]  Name: [name] MRN: [id number] Date of Birth: [birth year]

## 2018-07-11 NOTE — Progress Notes (Signed)
Patient was seen during head and neck clinic.  67 year old male diagnosed with metastatic head and neck cancer, unknown primary, P 16+.  He is status post right neck dissection on October 30 with 1 of 18 lymph nodes positive. He is receiving 30 fractions of adjuvant radiation therapy to his oropharynx and bilateral neck nodes.  He is status post modified barium swallow and is currently safe for thin liquids.  He may need to use thicken up if his swallow worsens.  Patient has partial dentures.  Past medical history includes cocaine and alcohol use, GERD, diabetes, depression, hepatitis C, and Bell's palsy.  Medications include Glucophage, Lipitor, and Neurontin.  Labs were reviewed.  Height: 6 feet 1 inch. Weight: 205.4 pounds December 10. Usual body weight: 224 pounds in November. BMI: 27.10.  Patient reports some difficulty chewing and swallowing solids. He is drinking about 2 bottles of boost plus daily. There is no plan for feeding tube at this time. Patient has lost approximately 20 pounds over 1 month because he has been afraid to eat.  States food gets stuck in his throat.  He denies difficulty swallowing liquids.  Nutrition diagnosis: Inadequate oral intake related to metastatic cancer as evidenced by 8% weight loss over approximately 1 month.  Intervention: Patient educated to try to eat small more frequent meals and snacks consisting of soft foods with extra sauces and gravy. Educated patient to increase oral nutrition supplements to boost plus 3 times daily. Provided coupons. Educated patient on the importance of increasing oral intake with allowed foods.  Fact sheets were given.  Questions were answered.  Teach back method used.  Contact information given.  Monitoring, evaluation, goals: Patient will tolerate increased calories and protein to minimize further weight loss.  Next visit: To be scheduled with treatments as needed.  **Disclaimer: This note was dictated with  voice recognition software. Similar sounding words can inadvertently be transcribed and this note may contain transcription errors which may not have been corrected upon publication of note.**

## 2018-07-11 NOTE — Therapy (Signed)
[place], [place], [address] Phone: [phone]   Fax:  [phone]  Physical Therapy Evaluation  Patient Details  Name: [name] MRN: [id number] Date of Birth: 1951-05-05 Referring Provider (PT): Dr. Isidore Moos   Encounter Date: 07/11/2018  PT End of Session - 07/11/18 1135    Visit Number  1    Number of Visits  2   will see pt for follow up is neck pain continues   Date for PT Re-Evaluation  09/02/18    PT Start Time  0920    PT Stop Time  0945    PT Time Calculation (min)  25 min    Activity Tolerance  Patient tolerated treatment well    Behavior During Therapy  [facility] for tasks assessed/performed       Past Medical History:  Diagnosis Date  . Alcohol abuse    until age 27  . Arthritis   . Bell's palsy 05/2017  . BPH (benign prostatic hyperplasia)   . Chronic hepatitis C without hepatic coma ([place]) FOLLOWED BY INFECTOUS DISEASE DR COMER   POSITIVE ANTIBODY TEST THIS YEAR 2016--  CURRENT TX ON HARVONI PO- completeed 2016  . Chronic low back pain   . Collapsed lung    right .Marland Kitchen..15 yrs ago, fell and hit some bricks  . DDD (degenerative disc disease), lumbosacral   . Depression    no meds now- med used for bladder  . 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 ED (erectile dysfunction)   . Foley catheter in place    post lumbar surgery- cath left in throughh rehab- had 2 UTI's  . GERD (gastroesophageal reflux disease)    hx none in long time  . History of cocaine abuse ([place])    per pt quit and last used 06-03-2013  . Urinary retention     Past Surgical History:  Procedure Laterality Date  . APPENDECTOMY  1980's  . BACK SURGERY    . [place]  . CLOSED REDUCTION AND INTRAMAXILLARY FIXATION BILATERAL MENTAL FX'S/  PLACEMENT MAXILLARY STENT/ APERTURE WIRE PLACEMENT  01-04-2000  . DIRECT LARYNGOSCOPY N/A 04/21/2018   Procedure: DIRECT LARYNGOSCOPY WITH  BIOPSY OF TONGUE BASE AND NASOPHARYNX;  Surgeon: Jerrell Belfast, MD;  Location: [facility];  Service: ENT;  Laterality: N/A;  Direct laryngoscopy with biopsy of tongue base and nasopharynx  . JOINT REPLACEMENT    . POSTERIOR LUMBAR FUSION  03-05-2015   laminectomy /  nerve decompression--  L4 -- S1  . RADICAL NECK DISSECTION Right 05/31/2018   Procedure: RADICAL MODIFIED RADICAL NECK DISSECTION;  Surgeon: Jerrell Belfast, MD;  Location: [facility];  Service: ENT;  Laterality: Right;  . TONSILLECTOMY  04/21/2018   Procedure: RIGHT TONSILLECTOMY;  Surgeon: Jerrell Belfast, MD;  Location: [facility];  Service: ENT;;  . TONSILLECTOMY    . TOTAL KNEE ARTHROPLASTY Left 09/21/2013   Procedure: LEFT TOTAL KNEE ARTHROPLASTY;  Surgeon: Yvette Rack., MD;  Location: [facility];  Service: Orthopedics;  Laterality: Left;  . TRANSURETHRAL RESECTION OF PROSTATE N/A 06/16/2015   Procedure: TRANSURETHRAL RESECTION OF THE PROSTATE WITH GYRUS INSTRUMENTS;  Surgeon: Franchot Gallo, MD;  Location: [facility];  Service: Urology;  Laterality: N/A;    There were no vitals filed for this visit.   Subjective Assessment - 07/11/18 1131    Subjective  I walk about 30-45 minutes every day. My neck feels really tight. It  hurts.    Pertinent History  secondary squamous cell carcinoma of neck with unknown primary, Stage I, p 16 positive, R neck diessection 05/31/18, 1 of 18 lymph nodes positive, pt started radiation today, hx of L knee replacement in 2015, awaiting R knee replacement - was postponed due to cancer diagnosis, 02/21/18 CT neck indicated severe canal stenosis C3-C4 awating surgery on this as well, hx of cocaine and ETOH abuse none since 2017    Patient Stated Goals  to meet with all head and neck clinic providers    Currently in Pain?  Yes    Pain Score  8     Pain Location  Knee    Pain Orientation  Right    Pain Descriptors / Indicators  --   grinding   Pain Type  Chronic pain    Pain Onset  More than  a month ago    Pain Frequency  Constant    Aggravating Factors   walking    Pain Relieving Factors  nothing    Effect of Pain on Daily Activities  hard to walk         [facility] PT Assessment - 07/11/18 0001      Assessment   Medical Diagnosis  secondary squamous cell carcinoma of neck    Referring Provider (PT)  Dr. Isidore Moos    Onset Date/Surgical Date  05/31/18    Hand Dominance  Right    Prior Therapy  none      Precautions   Precautions  Other (comment)    Precaution Comments  active cancer, needs R TKA, several canal stenosis C3-C4      Restrictions   Weight Bearing Restrictions  No      Balance Screen   Has the patient fallen in the past 6 months  No    Has the patient had a decrease in activity level because of a fear of falling?   No    Is the patient reluctant to leave their home because of a fear of falling?   No      Home Social worker  Private residence    Living Arrangements  Alone    Available Help at Discharge  Friend(s)    Type of Home  Apartment    Home Public relations account executive      Prior Function   Level of Independence  Independent    Vocation  --   Ship broker   Vocation Requirements  bending down, crawling on knees    Leisure  pt walks daily 30-45 min      Cognition   Overall Cognitive Status  Within Functional Limits for tasks assessed      Observation/Other Assessments   Observations  R lateral neck scar which is tight in some areas      Functional Tests   Functional tests  Sit to Stand      Sit to Stand   Comments  30 sec sit to stand: 13 reps which is average for his age   with knee pain      Posture/Postural Control   Posture/Postural Control  Postural limitations    Postural Limitations  Rounded Shoulders;Forward head      ROM / Strength   AROM / PROM / Strength  AROM      AROM   Overall AROM Comments  WFL for shoulders    AROM Assessment Site  Cervical    Cervical Extension  50% limited    Cervical -  Right  Side Bend  25% limited with p!    Cervical - Left Side Bend  25% limited pulling on scar    Cervical - Right Rotation  80% limited by pain    Cervical - Left Rotation  [facility]      Ambulation/Gait   Ambulation/Gait  Yes    Ambulation/Gait Assistance  7: Independent    Ambulation Distance (Feet)  15 Feet    Gait Pattern  --   limp due to needing R knee replacement        LYMPHEDEMA/ONCOLOGY QUESTIONNAIRE - 07/11/18 1154      Lymphedema Assessments   Lymphedema Assessments  Head and Neck      Head and Neck   4 cm superior to sternal notch around neck  38.4 cm    6 cm superior to sternal notch around neck  38.8 cm    8 cm superior to sternal notch around neck  39.5 cm             Objective measurements completed on examination: See above findings.              PT Education - 07/11/18 1135    Education Details  Neck ROM, posture, breathing, walking, CURE article on staying active, "Why exercise?" flyer, lymphedema and PT info    Person(s) Educated  Patient    Methods  Explanation;Demonstration;Verbal cues;Handout    Comprehension  Verbalized understanding;Returned demonstration          PT Long Term Goals - 07/11/18 1145      PT LONG TERM GOAL #1   Title  Pt will demonstrate return full cervical ROM as evidenced by return to baseline    Time  8    Period  Weeks    Status  New    Target Date  09/02/18         Head and Neck Clinic Goals - 07/11/18 1145      Patient will be able to verbalize understanding of a home exercise program for cervical range of motion, posture, and walking.    Status  Achieved      Patient will be able to verbalize understanding of proper sitting and standing posture.    Status  Achieved      Patient will be able to verbalize understanding of lymphedema risk and availability of treatment for this condition.    Status  Achieved         Plan - 07/11/18 1136    Clinical Impression Statement  Pt  presents to head and neck clinic with recently diagnosed squamous cell carcinoma secondary to neck with unknown primary. Pt began radiation today. He has limited cervical ROM due to recent neck dissection on 05/31/18 and has pain and tightness in this area. Instructed pt in exercises to help improve ROM and help decrease pain. His shoulder ROM and gait are WFL. Educated pt about lymphedema and signs and symptoms. If pt continues to have R sided neck pain and limited ROM he would benefit from skilled PT services. Will place pt on hold currently per pt's request due to number of appointments he has. Pt to call and schedule if he continues to have difficulty or develops swelling.    History and Personal Factors relevant to plan of care:  pt does not drive, pt is right handed, awaiting R knee replacement and neurosurgery for severe canal stenosis    Clinical Presentation  Evolving    Clinical Presentation due to:  pt  is starting radiation    Clinical Decision Making  Moderate    Rehab Potential  Good    Clinical Impairments Affecting Rehab Potential  pt beginning radiation    PT Frequency  --   eval and 1 follow up visit   PT Duration  8 weeks    PT Treatment/Interventions  ADLs/Self Care Home Management;Patient/family education;Therapeutic exercise;Therapeutic activities    PT Next Visit Plan  reassess neck ROM, assess for swelling or trismus add goals as needed    PT Home Exercise Plan  head and neck exercises, pt currently walking daily    Consulted and Agree with Plan of Care  Patient       Patient will benefit from skilled therapeutic intervention in order to improve the following deficits and impairments:  Pain, Postural dysfunction, Decreased scar mobility, Decreased range of motion, Decreased strength, Increased edema  Visit Diagnosis: Cervicalgia  Abnormal posture     Problem List Patient Active Problem List   Diagnosis Date Noted  . Metastatic squamous cell carcinoma to head and  neck ([place]) 05/31/2018  . Rectal bleeding 05/30/2018  . Dark stools 05/30/2018  . NSAID long-term use 05/30/2018  . Secondary malignancy of lymph nodes of head, face and neck ([place]) 05/12/2018  . 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 Cervical stenosis of spinal canal 05/10/2018  . Neck mass 04/21/2018  . Liver fibrosis 04/05/2018  . Fusion of lumbar spine 02/07/2017  . Constipation due to opioid therapy 01/27/2017  . Lumbar stenosis with neurogenic claudication 01/24/2017  . Enlarged prostate with urinary obstruction 06/16/2015  . Lumbar degenerative disc disease 03/05/2015  . Chronic hepatitis C without hepatic coma ([place]) 12/09/2014  . Chronic low back pain 01/17/2014  . Osteoarthritis of left knee 09/21/2013  . Alcohol dependence ([place]) 06/12/2013  . Cocaine dependence ([place]) 06/12/2013  . Substance induced mood disorder ([place]) 06/12/2013    Allyson Sabal [facility] 07/11/2018, 11:55 AM  [place], [place], [address] Phone: [phone]   Fax:  [phone]  Name: ZACHERY NISWANDER MRN: [id number] Date of Birth: [birth year]  Manus Gunning, PT 07/11/18 11:55 AM

## 2018-07-11 NOTE — Progress Notes (Signed)
Clinical Social Work English as a second language teacher [place]  Clinical Social Worker met with patient in [facility] to offer support and assess for needs.  CSW educated patient on CSW role and resources provided by the [facility] support team.  CSW and patient discussed common feelings and emotions when going through treatment and the importance of support.  Patient expressed interest in counseling.  CSW and patient discussed counseling options and provided patient with information on providers in the community.  CSW also provided contact information for the CSW team and encouraged patient to call if he had any difficulties or additional needs or concerns.    Johnnye Lana, MSW, LCSW, OSW-C Clinical Social Worker [facility] [phone]

## 2018-07-11 NOTE — Patient Instructions (Signed)
SWALLOWING EXERCISES Do these 6 of the 7 days per week until 6 months after your last day of radiaiton, then 3 times per week afterwards  1. Effortful Swallows - Press your tongue against the roof of your mouth for 3 seconds, then squeeze the muscles in your neck while you swallow your saliva or a sip of water - Repeat 15-20 times, 2-3 times a day, and use whenever you eat or drink  2. Masako Swallow - swallow with your tongue sticking out - Stick tongue out past your teeth and gently bite tongue with your teeth - Swallow, while holding your tongue with your teeth - Repeat 15-20 times, 2-3 times a day *use a wet spoon if your mouth gets dry*  3. Pitch Raise - Repeat "he", once per second in as high of a pitch as you can - Repeat 20 times, 2-3 times a day  4. Shaker Exercise - head lift - Lie flat on your back in your bed or on a couch without pillows - Raise your head and look at your feet - KEEP YOUR SHOULDERS DOWN - HOLD FOR 45-60 SECONDS, then lower your head back down - Repeat 3 times, 2-3 times a day  5. Mendelsohn Maneuver - "half swallow" exercise - Start to swallow, and keep your Adam's apple up by squeezing hard with the            muscles of the throat - Hold the squeeze for 5-7 seconds and then relax - Repeat 15 times, 2-3 times a day *use a wet spoon if your mouth gets dry*  6. Breath Hold - Say "HUH!" loudly, then hold your breath for 3 seconds at your voice box - Repeat 20 times, 2-3 times a day  7. Chin pushback - Open your mouth  - Place your fist UNDER your chin near your neck, and push back with your fist for 5 seconds - Repeat 10 times, 2-3 times a day

## 2018-07-12 ENCOUNTER — Ambulatory Visit
Admission: RE | Admit: 2018-07-12 | Discharge: 2018-07-12 | Disposition: A | Discharge status: Home / Self Care | Payer: Medicare HMO | Source: Ambulatory Visit | Attending: Radiation Oncology | Admitting: Radiation Oncology

## 2018-07-12 DIAGNOSIS — C770 Secondary and unspecified malignant neoplasm of lymph nodes of head, face and neck: Secondary | ICD-10-CM | POA: Diagnosis not present

## 2018-07-12 NOTE — Progress Notes (Signed)
Oncology Nurse Navigator Documentation  Met with Mr. [name] prior to H&N MDC, escorted him to [facility] for initial RT.  He completed tmt without difficulty.  Upon his arrival for MDC:  Provided verbal and written overview of MDC, the clinicians who will be seeing her, encouraged her to ask questions during her time with them.  She was seen by Nutrition, SLP, PT, and SW.  He received call later from [employer] Administrative Support Specialist.  Spoke with him at end of MDC, addressed questions. I encouraged her to call me with needs/concerns.  Rick Diehl, RN, BSN Head & Neck Oncology Navigator [facility] at [place] [phone]   

## 2018-07-12 NOTE — Progress Notes (Signed)
Oncology Nurse Navigator Documentation  To provide support, encouragement and care continuity, met with [name] prior to his initial  RT.  He was unaccompanied.   Reviewed the 2-step treatment process, answered questions.   Informed later by RTT he was unable to complete tmt d/t anxiety.  He addressed issue with Dr. Isidore Moos.  Jeremy Orem, RN, BSN Head & Neck Oncology Nurse [place] at [place] [phone]

## 2018-07-13 ENCOUNTER — Ambulatory Visit: Payer: Medicare HMO

## 2018-07-13 ENCOUNTER — Telehealth: Payer: Self-pay | Admitting: *Deleted

## 2018-07-13 ENCOUNTER — Telehealth: Payer: Self-pay | Admitting: Radiation Oncology

## 2018-07-13 ENCOUNTER — Encounter: Payer: Self-pay | Admitting: Gastroenterology

## 2018-07-13 NOTE — Telephone Encounter (Signed)
Oncology Nurse Navigator Documentation  Rec'd call from Mr. Leiner cancelling RT for this afternoon.  He indicated he did not feel well this morning, "feel like I'm getting a cold".  I emphasized the importance of not having unnecessary breaks in tmt, encouraged him to arrive for RT.  He stated he will come tomorrow.  [facility] 2, Dr. Isidore Moos and RN Anderson Malta notified.  Gayleen Orem, RN, BSN Head & Neck Oncology Nurse [place] at [place] [phone]

## 2018-07-13 NOTE — Telephone Encounter (Signed)
Spoke with patient and he stated that he was throwing up this morning and did not feel well enough to come in. I have scheduled and confirmed his ride for tomorrow.

## 2018-07-14 ENCOUNTER — Ambulatory Visit
Admission: RE | Admit: 2018-07-14 | Discharge: 2018-07-14 | Disposition: A | Discharge status: Home / Self Care | Payer: Medicare HMO | Source: Ambulatory Visit | Attending: Radiation Oncology | Admitting: Radiation Oncology

## 2018-07-14 DIAGNOSIS — C770 Secondary and unspecified malignant neoplasm of lymph nodes of head, face and neck: Secondary | ICD-10-CM | POA: Diagnosis not present

## 2018-07-17 ENCOUNTER — Other Ambulatory Visit: Payer: Self-pay | Admitting: Radiation Oncology

## 2018-07-17 ENCOUNTER — Ambulatory Visit
Admission: RE | Admit: 2018-07-17 | Discharge: 2018-07-17 | Disposition: A | Discharge status: Home / Self Care | Payer: Medicare HMO | Source: Ambulatory Visit | Attending: Radiation Oncology | Admitting: Radiation Oncology

## 2018-07-17 DIAGNOSIS — C770 Secondary and unspecified malignant neoplasm of lymph nodes of head, face and neck: Secondary | ICD-10-CM | POA: Diagnosis not present

## 2018-07-17 DIAGNOSIS — C7989 Secondary malignant neoplasm of other specified sites: Secondary | ICD-10-CM

## 2018-07-17 MED ORDER — SONAFINE EX EMUL
1.0000 "application " | Freq: Once | CUTANEOUS | Status: AC
  Administered 2018-07-17: 1 via TOPICAL

## 2018-07-17 MED ORDER — LIDOCAINE VISCOUS HCL 2 % MT SOLN: OROMUCOSAL | 5 refills | Status: DC

## 2018-07-17 NOTE — Progress Notes (Signed)

## 2018-07-18 ENCOUNTER — Ambulatory Visit: Payer: Medicare HMO

## 2018-07-18 ENCOUNTER — Encounter: Payer: Self-pay | Admitting: Nutrition

## 2018-07-18 ENCOUNTER — Ambulatory Visit
Admission: RE | Admit: 2018-07-18 | Discharge: 2018-07-18 | Disposition: A | Discharge status: Home / Self Care | Payer: Medicare HMO | Source: Ambulatory Visit | Attending: Radiation Oncology | Admitting: Radiation Oncology

## 2018-07-18 DIAGNOSIS — C770 Secondary and unspecified malignant neoplasm of lymph nodes of head, face and neck: Secondary | ICD-10-CM | POA: Diagnosis not present

## 2018-07-18 NOTE — Progress Notes (Signed)
Patient was given one case complementary Ensure Enlive.

## 2018-07-19 ENCOUNTER — Ambulatory Visit: Payer: Medicare HMO | Admitting: Nutrition

## 2018-07-19 ENCOUNTER — Ambulatory Visit
Admission: RE | Admit: 2018-07-19 | Discharge: 2018-07-19 | Disposition: A | Discharge status: Home / Self Care | Payer: Medicare HMO | Source: Ambulatory Visit | Attending: Radiation Oncology | Admitting: Radiation Oncology

## 2018-07-19 DIAGNOSIS — C770 Secondary and unspecified malignant neoplasm of lymph nodes of head, face and neck: Secondary | ICD-10-CM | POA: Diagnosis not present

## 2018-07-19 NOTE — Progress Notes (Signed)
Patient showed up mistakenly today thinking he had an appointment with me. His appointment was scheduled on Friday. I made time to see him today. 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 Patient is concerned that he is continuing to lose weight. He is afraid to eat some foods since they "hang" up in his throat. He has lost 8% weight since Nov 5 which is significant. He is concerned he may need a feeding tube and wants to try to increase calories. Reports he tolerates most liquids. He is eating some smooth foods. Patient does not want a nutrition focused physical exam.  Estimated Nutrition Needs: 2400-2600 kcal, 112-125 grams protein, 2.5 L fluid  Nutrition Diagnosis: Inadequate oral intake continues.  Intervention: Educated patient on pureeing foods to a consistency he can easily and safely swallow. Educated him to increase oral nutrition supplements to 5 daily to provide approximately 1750 kcal daily. He has received a complimentary case of Ensure Enlive. Provided examples of pureed meals and gave him a fact sheet. Questions were answered and teach back method used.  Monitoring, Evaluation, Goals: Patient will increase calories and protein to minimize weight loss.  Next Visit: Friday, Jan 3.  **Disclaimer: This note was dictated with voice recognition software. Similar sounding words can inadvertently be transcribed and this note may contain transcription errors which may not have been corrected upon publication of note.**

## 2018-07-20 ENCOUNTER — Other Ambulatory Visit (HOSPITAL_COMMUNITY): Payer: Self-pay | Admitting: Neurosurgery

## 2018-07-20 ENCOUNTER — Ambulatory Visit
Admission: RE | Admit: 2018-07-20 | Discharge: 2018-07-20 | Disposition: A | Discharge status: Home / Self Care | Payer: Medicare HMO | Source: Ambulatory Visit | Attending: Radiation Oncology | Admitting: Radiation Oncology

## 2018-07-20 DIAGNOSIS — M4802 Spinal stenosis, cervical region: Secondary | ICD-10-CM

## 2018-07-20 DIAGNOSIS — C770 Secondary and unspecified malignant neoplasm of lymph nodes of head, face and neck: Secondary | ICD-10-CM | POA: Diagnosis not present

## 2018-07-21 ENCOUNTER — Encounter: Payer: Self-pay | Admitting: Nutrition

## 2018-07-21 ENCOUNTER — Ambulatory Visit
Admission: RE | Admit: 2018-07-21 | Discharge: 2018-07-21 | Disposition: A | Discharge status: Home / Self Care | Payer: Medicare HMO | Source: Ambulatory Visit | Attending: Radiation Oncology | Admitting: Radiation Oncology

## 2018-07-21 DIAGNOSIS — C770 Secondary and unspecified malignant neoplasm of lymph nodes of head, face and neck: Secondary | ICD-10-CM | POA: Diagnosis not present

## 2018-07-24 ENCOUNTER — Ambulatory Visit
Admission: RE | Admit: 2018-07-24 | Discharge: 2018-07-24 | Disposition: A | Discharge status: Home / Self Care | Payer: Medicare HMO | Source: Ambulatory Visit | Attending: Radiation Oncology | Admitting: Radiation Oncology

## 2018-07-24 ENCOUNTER — Encounter: Payer: Self-pay | Admitting: Nutrition

## 2018-07-24 DIAGNOSIS — C770 Secondary and unspecified malignant neoplasm of lymph nodes of head, face and neck: Secondary | ICD-10-CM | POA: Diagnosis not present

## 2018-07-24 NOTE — Progress Notes (Signed)
Provided 1 complementary case of Ensure Enlive. 

## 2018-07-25 ENCOUNTER — Ambulatory Visit
Admission: RE | Admit: 2018-07-25 | Discharge: 2018-07-25 | Disposition: A | Discharge status: Home / Self Care | Payer: Medicare HMO | Source: Ambulatory Visit | Attending: Radiation Oncology | Admitting: Radiation Oncology

## 2018-07-25 DIAGNOSIS — C770 Secondary and unspecified malignant neoplasm of lymph nodes of head, face and neck: Secondary | ICD-10-CM | POA: Diagnosis not present

## 2018-07-27 ENCOUNTER — Telehealth: Payer: Self-pay

## 2018-07-27 ENCOUNTER — Ambulatory Visit
Admission: RE | Admit: 2018-07-27 | Discharge: 2018-07-27 | Disposition: A | Discharge status: Home / Self Care | Payer: Medicare HMO | Source: Ambulatory Visit | Attending: Radiation Oncology | Admitting: Radiation Oncology

## 2018-07-27 DIAGNOSIS — C770 Secondary and unspecified malignant neoplasm of lymph nodes of head, face and neck: Secondary | ICD-10-CM | POA: Diagnosis not present

## 2018-07-27 MED FILL — LIDOCAINE 2% VISCOUS SOLN: 2 | 5 days supply | Qty: 100 | Fill #0

## 2018-07-27 NOTE — Telephone Encounter (Signed)
Contacted pt at the request of Alli from transportation. Pt reports viscous lidocaine is causing pt to vomit every food or liquid consumed. Pt reports that his throat is still burning and pt can not "keep Ensure down, its even coming back up". Conveyed to pt that he should not take any more viscous lidocaine and that this RN would alert Anderson Malta and Dr. Isidore Moos to this concern. Pt verbalized understanding and agreement. Loma Sousa, RN BSN

## 2018-07-28 ENCOUNTER — Ambulatory Visit
Admission: RE | Admit: 2018-07-28 | Discharge: 2018-07-28 | Disposition: A | Discharge status: Home / Self Care | Payer: Medicare HMO | Source: Ambulatory Visit | Attending: Radiation Oncology | Admitting: Radiation Oncology

## 2018-07-28 ENCOUNTER — Other Ambulatory Visit: Payer: Self-pay | Admitting: Radiation Oncology

## 2018-07-28 DIAGNOSIS — C7989 Secondary malignant neoplasm of other specified sites: Secondary | ICD-10-CM

## 2018-07-28 DIAGNOSIS — C770 Secondary and unspecified malignant neoplasm of lymph nodes of head, face and neck: Secondary | ICD-10-CM | POA: Diagnosis not present

## 2018-07-28 MED ORDER — IBUPROFEN 40 MG/ML PO SUSP: 400.0000 mg | Freq: Four times a day (QID) | ORAL | 3 refills | Status: DC | PRN

## 2018-07-28 MED ORDER — PROMETHAZINE HCL 25 MG PO TABS: 25.0000 mg | ORAL_TABLET | Freq: Four times a day (QID) | ORAL | 2 refills | Status: DC | PRN

## 2018-07-28 MED ORDER — SUCRALFATE 1 G PO TABS: ORAL_TABLET | ORAL | 5 refills | Status: DC

## 2018-07-28 MED FILL — PROMETHAZINE 25 MG TABLET: 25 | 7 days supply | Qty: 30 | Fill #0

## 2018-07-28 MED FILL — IBUPROFEN CHILDRENS 100 MG/: 100 | 5 days supply | Qty: 360 | Fill #0

## 2018-07-28 MED FILL — SUCRALFATE 1 GM TABLET: 1 | 10 days supply | Qty: 40 | Fill #0

## 2018-07-31 ENCOUNTER — Ambulatory Visit
Admission: RE | Admit: 2018-07-31 | Discharge: 2018-07-31 | Disposition: A | Discharge status: Home / Self Care | Payer: Medicare HMO | Source: Ambulatory Visit | Attending: Radiation Oncology | Admitting: Radiation Oncology

## 2018-07-31 DIAGNOSIS — C770 Secondary and unspecified malignant neoplasm of lymph nodes of head, face and neck: Secondary | ICD-10-CM | POA: Diagnosis not present

## 2018-08-01 ENCOUNTER — Other Ambulatory Visit: Payer: Self-pay | Admitting: Radiation Oncology

## 2018-08-01 ENCOUNTER — Ambulatory Visit
Admission: RE | Admit: 2018-08-01 | Discharge: 2018-08-01 | Disposition: A | Discharge status: Home / Self Care | Payer: Medicare HMO | Source: Ambulatory Visit | Attending: Radiation Oncology | Admitting: Radiation Oncology

## 2018-08-01 DIAGNOSIS — C770 Secondary and unspecified malignant neoplasm of lymph nodes of head, face and neck: Secondary | ICD-10-CM | POA: Diagnosis not present

## 2018-08-03 ENCOUNTER — Ambulatory Visit
Admission: RE | Admit: 2018-08-03 | Discharge: 2018-08-03 | Disposition: A | Discharge status: Home / Self Care | Payer: Medicare HMO | Source: Ambulatory Visit | Attending: Radiation Oncology | Admitting: Radiation Oncology

## 2018-08-03 DIAGNOSIS — C770 Secondary and unspecified malignant neoplasm of lymph nodes of head, face and neck: Secondary | ICD-10-CM | POA: Diagnosis not present

## 2018-08-04 ENCOUNTER — Inpatient Hospital Stay: Payer: Medicare HMO | Attending: Hematology | Admitting: Nutrition

## 2018-08-04 ENCOUNTER — Ambulatory Visit: Payer: Medicare HMO

## 2018-08-04 ENCOUNTER — Encounter: Payer: Self-pay | Admitting: Nutrition

## 2018-08-04 NOTE — Progress Notes (Signed)
Patient did not show up for nutrition appointment. 

## 2018-08-05 ENCOUNTER — Encounter (HOSPITAL_COMMUNITY): Payer: Self-pay | Admitting: *Deleted

## 2018-08-05 ENCOUNTER — Other Ambulatory Visit: Payer: Self-pay

## 2018-08-05 NOTE — Progress Notes (Addendum)
Patient denies CP, Shob, or cardiology visit. States that he is having trouble swallowing and living off of Ensure, Boost, and Milkshakes. States that medical transportation will be bringing him and taking him home and reports that he has someone that will be with him for 24 hours post anesthesia. States that DM has not been a real concern for him.

## 2018-08-07 ENCOUNTER — Encounter (HOSPITAL_COMMUNITY): Payer: Self-pay | Admitting: Certified Registered Nurse Anesthetist

## 2018-08-07 ENCOUNTER — Ambulatory Visit
Admission: RE | Admit: 2018-08-07 | Discharge: 2018-08-07 | Disposition: A | Discharge status: Home / Self Care | Payer: Medicare HMO | Source: Ambulatory Visit | Attending: Radiation Oncology | Admitting: Radiation Oncology

## 2018-08-07 ENCOUNTER — Ambulatory Visit: Payer: Medicare HMO | Attending: Radiation Oncology

## 2018-08-07 DIAGNOSIS — R1312 Dysphagia, oropharyngeal phase: Secondary | ICD-10-CM | POA: Insufficient documentation

## 2018-08-07 DIAGNOSIS — C770 Secondary and unspecified malignant neoplasm of lymph nodes of head, face and neck: Secondary | ICD-10-CM | POA: Diagnosis not present

## 2018-08-07 NOTE — Anesthesia Preprocedure Evaluation (Addendum)
Anesthesia Evaluation  Patient identified by MRN, date of birth, ID band Patient awake    Reviewed: Allergy & Precautions, NPO status , Patient's Chart, lab work & pertinent test results  Airway Mallampati: II  TM Distance: >3 FB Neck ROM: Full    Dental no notable dental hx.    Pulmonary neg pulmonary ROS,    Pulmonary exam normal breath sounds clear to auscultation       Cardiovascular negative cardio ROS Normal cardiovascular exam Rhythm:Regular Rate:Normal     Neuro/Psych Depression negative neurological ROS  negative psych ROS   GI/Hepatic Neg liver ROS, GERD  ,  Endo/Other  negative endocrine ROSdiabetes, Type 2  Renal/GU negative Renal ROS  negative genitourinary   Musculoskeletal  (+) Arthritis , Osteoarthritis,    Abdominal   Peds negative pediatric ROS (+)  Hematology negative hematology ROS (+)   Anesthesia Other Findings   Reproductive/Obstetrics negative OB ROS                                                             Anesthesia Evaluation  Patient identified by MRN, date of birth, ID band Patient awake    Reviewed: Allergy & Precautions, NPO status , Patient's Chart, lab work & pertinent test results  Airway Mallampati: I  TM Distance: >3 FB Neck ROM: Full    Dental   Pulmonary    Pulmonary exam normal        Cardiovascular Normal cardiovascular exam     Neuro/Psych Depression    GI/Hepatic GERD  Medicated and Controlled,(+) Hepatitis -, C  Endo/Other  diabetes, Type 2, Oral Hypoglycemic Agents  Renal/GU      Musculoskeletal   Abdominal   Peds  Hematology   Anesthesia Other Findings   Reproductive/Obstetrics                             Anesthesia Physical Anesthesia Plan  ASA: II  Anesthesia Plan: General   Post-op Pain Management:    Induction: Intravenous  PONV Risk Score and Plan: 2  Airway  Management Planned: Oral ETT  Additional Equipment:   Intra-op Plan:   Post-operative Plan: Extubation in OR  Informed Consent: I have reviewed the patients History and Physical, chart, labs and discussed the procedure including the risks, benefits and alternatives for the proposed anesthesia with the patient or authorized representative who has indicated his/her understanding and acceptance.     Plan Discussed with: CRNA and Surgeon  Anesthesia Plan Comments:         Anesthesia Quick Evaluation  Anesthesia Physical Anesthesia Plan  ASA: III  Anesthesia Plan: General   Post-op Pain Management:    Induction: Intravenous, Rapid sequence and Cricoid pressure planned  PONV Risk Score and Plan: 2 and Ondansetron and Midazolam  Airway Management Planned: Oral ETT  Additional Equipment:   Intra-op Plan:   Post-operative Plan: Extubation in OR  Informed Consent: I have reviewed the patients History and Physical, chart, labs and discussed the procedure including the risks, benefits and alternatives for the proposed anesthesia with the patient or authorized representative who has indicated his/her understanding and acceptance.   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 He had swallow study 07/10/18 showing dysfunction and per SLP he is at moderate risk for aspiration. )       Anesthesia Quick Evaluation

## 2018-08-08 ENCOUNTER — Other Ambulatory Visit: Payer: Self-pay

## 2018-08-08 ENCOUNTER — Encounter (HOSPITAL_COMMUNITY): Payer: Self-pay | Admitting: Surgery

## 2018-08-08 ENCOUNTER — Ambulatory Visit (HOSPITAL_COMMUNITY)
Admission: RE | Admit: 2018-08-08 | Discharge: 2018-08-08 | Disposition: A | Discharge status: Home / Self Care | Payer: Medicare HMO | Source: Ambulatory Visit | Attending: Neurosurgery | Admitting: Neurosurgery

## 2018-08-08 ENCOUNTER — Ambulatory Visit (HOSPITAL_COMMUNITY): Payer: Medicare HMO | Admitting: Physician Assistant

## 2018-08-08 ENCOUNTER — Ambulatory Visit: Payer: Medicare HMO

## 2018-08-08 ENCOUNTER — Ambulatory Visit (HOSPITAL_COMMUNITY)
Admission: RE | Admit: 2018-08-08 | Discharge: 2018-08-08 | Disposition: A | Discharge status: Home / Self Care | Payer: Medicare HMO | Attending: Neurosurgery | Admitting: Neurosurgery

## 2018-08-08 ENCOUNTER — Encounter (HOSPITAL_COMMUNITY)
Admission: RE | Disposition: A | Discharge status: Home / Self Care | Payer: Self-pay | Source: Home / Self Care | Attending: Neurosurgery

## 2018-08-08 DIAGNOSIS — M4802 Spinal stenosis, cervical region: Secondary | ICD-10-CM | POA: Insufficient documentation

## 2018-08-08 DIAGNOSIS — M5031 Other cervical disc degeneration,  high cervical region: Secondary | ICD-10-CM | POA: Insufficient documentation

## 2018-08-08 DIAGNOSIS — F329 Major depressive disorder, single episode, unspecified: Secondary | ICD-10-CM | POA: Insufficient documentation

## 2018-08-08 DIAGNOSIS — M1990 Unspecified osteoarthritis, unspecified site: Secondary | ICD-10-CM | POA: Diagnosis not present

## 2018-08-08 DIAGNOSIS — K219 Gastro-esophageal reflux disease without esophagitis: Secondary | ICD-10-CM | POA: Diagnosis not present

## 2018-08-08 DIAGNOSIS — E119 Type 2 diabetes mellitus without complications: Secondary | ICD-10-CM | POA: Diagnosis not present

## 2018-08-08 HISTORY — DX: Non-Hodgkin lymphoma, unspecified, unspecified site: C85.90

## 2018-08-08 HISTORY — PX: RADIOLOGY WITH ANESTHESIA: SHX6223

## 2018-08-08 LAB — HEMOGLOBIN A1C
HEMOGLOBIN A1C: 6 % — AB (ref 4.8–5.6)
Mean Plasma Glucose: 125.5 mg/dL

## 2018-08-08 LAB — COMPREHENSIVE METABOLIC PANEL
ALBUMIN: 3.7 g/dL (ref 3.5–5.0)
ALT: 22 U/L (ref 0–44)
AST: 23 U/L (ref 15–41)
Alkaline Phosphatase: 50 U/L (ref 38–126)
Anion gap: 9 (ref 5–15)
BUN: 11 mg/dL (ref 8–23)
CO2: 25 mmol/L (ref 22–32)
Calcium: 9.7 mg/dL (ref 8.9–10.3)
Chloride: 103 mmol/L (ref 98–111)
Creatinine, Ser: 1.03 mg/dL (ref 0.61–1.24)
GFR calc Af Amer: >60 mL/min (ref >60)
GFR calc non Af Amer: >60 mL/min (ref >60)
Glucose, Bld: 98 mg/dL (ref 70–99)
POTASSIUM: 4.4 mmol/L (ref 3.5–5.1)
Sodium: 137 mmol/L (ref 135–145)
Total Bilirubin: 0.5 mg/dL (ref 0.3–1.2)
Total Protein: 7.2 g/dL (ref 6.5–8.1)

## 2018-08-08 SURGERY — MRI WITH ANESTHESIA
Anesthesia: General

## 2018-08-08 MED ORDER — LACTATED RINGERS IV SOLN
INTRAVENOUS | Status: DC
  Administered 2018-08-08: 08:00:00 via INTRAVENOUS

## 2018-08-08 MED ORDER — LIDOCAINE 2% (20 MG/ML) 5 ML SYRINGE
INTRAMUSCULAR | Status: DC | PRN
  Administered 2018-08-08: 80 mg via INTRAVENOUS

## 2018-08-08 MED ORDER — FENTANYL CITRATE (PF) 100 MCG/2ML IJ SOLN
INTRAMUSCULAR | Status: DC | PRN
  Administered 2018-08-08: 100 ug via INTRAVENOUS

## 2018-08-08 MED ORDER — PROPOFOL 10 MG/ML IV BOLUS
INTRAVENOUS | Status: DC | PRN
  Administered 2018-08-08: 170 mg via INTRAVENOUS

## 2018-08-08 MED ORDER — SUCCINYLCHOLINE CHLORIDE 200 MG/10ML IV SOSY
PREFILLED_SYRINGE | INTRAVENOUS | Status: DC | PRN
  Administered 2018-08-08: 120 mg via INTRAVENOUS

## 2018-08-08 MED ORDER — DEXAMETHASONE SODIUM PHOSPHATE 10 MG/ML IJ SOLN
INTRAMUSCULAR | Status: DC | PRN
  Administered 2018-08-08: 10 mg via INTRAVENOUS

## 2018-08-08 MED ORDER — PHENYLEPHRINE 40 MCG/ML (10ML) SYRINGE FOR IV PUSH (FOR BLOOD PRESSURE SUPPORT)
PREFILLED_SYRINGE | INTRAVENOUS | Status: DC | PRN
  Administered 2018-08-08: 80 ug via INTRAVENOUS

## 2018-08-08 MED ORDER — MIDAZOLAM HCL 5 MG/5ML IJ SOLN
INTRAMUSCULAR | Status: DC | PRN
  Administered 2018-08-08: 2 mg via INTRAVENOUS

## 2018-08-08 MED ORDER — ONDANSETRON HCL 4 MG/2ML IJ SOLN
INTRAMUSCULAR | Status: DC | PRN
  Administered 2018-08-08: 4 mg via INTRAVENOUS

## 2018-08-08 NOTE — Anesthesia Postprocedure Evaluation (Signed)
Anesthesia Post Note  Patient: [name]  Procedure(s) Performed: MRI CERVICAL SPINE WITHOUT CONTRAST (N/A )     Patient location during evaluation: PACU Anesthesia Type: General Level of consciousness: awake and alert Pain management: pain level controlled Vital Signs Assessment: post-procedure vital signs reviewed and stable Respiratory status: spontaneous breathing, nonlabored ventilation and respiratory function stable Cardiovascular status: blood pressure returned to baseline and stable Postop Assessment: no apparent nausea or vomiting Anesthetic complications: no    Last Vitals:  Vitals:   08/08/18 0932 08/08/18 1000  BP: 122/86 124/84  Pulse: (!) 46 81  Resp: (!) 23 15  Temp:  36.8 C  SpO2: 100% 98%    Last Pain:  Vitals:   08/08/18 0940  PainSc: 0-No pain                 Lynda Rainwater

## 2018-08-08 NOTE — Progress Notes (Signed)
Discharge instructions reviewed with Thressa Sheller, patients friend. Helene Kelp is aware that pt received general anesthesia and the need to be supervised for 24 hours. Helene Kelp will be at patients apartment upon his arrival. [place] transportation called and will discharge patient upon arrival.

## 2018-08-08 NOTE — Transfer of Care (Signed)
Immediate Anesthesia Transfer of Care Note  Patient: [name]  Procedure(s) Performed: MRI CERVICAL SPINE WITHOUT CONTRAST (N/A )  Patient Location: PACU  Anesthesia Type:General  Level of Consciousness: awake, alert  and oriented  Airway & Oxygen Therapy: Patient Spontanous Breathing and Patient connected to nasal cannula oxygen  Post-op Assessment: Report given to RN and Post -op Vital signs reviewed and stable  Post vital signs: Reviewed and stable  Last Vitals:  Vitals Value Taken Time  BP    Temp    Pulse    Resp    SpO2      Last Pain:  Vitals:   08/08/18 0657  PainSc: 9       Patients Stated Pain Goal: 3 (70/48/88 9169)  Complications: No apparent anesthesia complications

## 2018-08-08 NOTE — Anesthesia Procedure Notes (Signed)
Procedure Name: Intubation Date/Time: 08/08/2018 8:16 AM Performed by: Candis Shine, CRNA Pre-anesthesia Checklist: Patient identified, Emergency Drugs available, Suction available and Patient being monitored Patient Re-evaluated:Patient Re-evaluated prior to induction Oxygen Delivery Method: Circle System Utilized Preoxygenation: Pre-oxygenation with 100% oxygen Induction Type: IV induction, Rapid sequence and Cricoid Pressure applied Laryngoscope Size: Mac and 4 Grade View: Grade I Tube type: Oral Tube size: 7.5 mm Number of attempts: 1 Airway Equipment and Method: Stylet Placement Confirmation: ETT inserted through vocal cords under direct vision,  positive ETCO2 and breath sounds checked- equal and bilateral Secured at: 22 cm Tube secured with: Tape Dental Injury: Teeth and Oropharynx as per pre-operative assessment

## 2018-08-09 ENCOUNTER — Inpatient Hospital Stay: Payer: Medicare HMO | Admitting: Nutrition

## 2018-08-09 ENCOUNTER — Ambulatory Visit
Admission: RE | Admit: 2018-08-09 | Discharge: 2018-08-09 | Disposition: A | Discharge status: Home / Self Care | Payer: Medicare HMO | Source: Ambulatory Visit | Attending: Radiation Oncology | Admitting: Radiation Oncology

## 2018-08-09 ENCOUNTER — Encounter (HOSPITAL_COMMUNITY): Payer: Self-pay | Admitting: Radiology

## 2018-08-09 DIAGNOSIS — C770 Secondary and unspecified malignant neoplasm of lymph nodes of head, face and neck: Secondary | ICD-10-CM | POA: Diagnosis not present

## 2018-08-09 NOTE — Progress Notes (Signed)
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  Weight documented as 194.6 pounds today, decreased from 205.4 pounds December 10. Patient reports he is tolerating soft foods and boost nutrition supplements. He continues to have difficulty affording nutrition supplements and food. He is trying to drink 6 supplements daily..  Nutrition diagnosis: Inadequate oral intake continues.  Intervention: I have provided additional samples of boost plus along with coupons. Have provided patient with samples from the food pantry. I have encouraged patient to modify textures and temperatures for easier swallowing. Teach back method used.  Monitoring, evaluation, goals: Patient will tolerate adequate calories and protein to minimize weight loss.  Next visit: Wednesday, January 15 after treatment.  **Disclaimer: This note was dictated with voice recognition software. Similar sounding words can inadvertently be transcribed and this note may contain transcription errors which may not have been corrected upon publication of note.**

## 2018-08-10 ENCOUNTER — Ambulatory Visit
Admission: RE | Admit: 2018-08-10 | Discharge: 2018-08-10 | Disposition: A | Discharge status: Home / Self Care | Payer: Medicare HMO | Source: Ambulatory Visit | Attending: Radiation Oncology | Admitting: Radiation Oncology

## 2018-08-10 DIAGNOSIS — C770 Secondary and unspecified malignant neoplasm of lymph nodes of head, face and neck: Secondary | ICD-10-CM | POA: Diagnosis not present

## 2018-08-11 ENCOUNTER — Ambulatory Visit
Admission: RE | Admit: 2018-08-11 | Discharge: 2018-08-11 | Disposition: A | Discharge status: Home / Self Care | Payer: Medicare HMO | Source: Ambulatory Visit | Attending: Radiation Oncology | Admitting: Radiation Oncology

## 2018-08-11 DIAGNOSIS — C770 Secondary and unspecified malignant neoplasm of lymph nodes of head, face and neck: Secondary | ICD-10-CM | POA: Diagnosis not present

## 2018-08-14 ENCOUNTER — Ambulatory Visit: Payer: Medicare HMO

## 2018-08-15 ENCOUNTER — Ambulatory Visit: Payer: Medicare HMO

## 2018-08-16 ENCOUNTER — Inpatient Hospital Stay: Payer: Medicare HMO | Admitting: Nutrition

## 2018-08-16 ENCOUNTER — Encounter: Payer: Self-pay | Admitting: Nutrition

## 2018-08-16 ENCOUNTER — Ambulatory Visit
Admission: RE | Admit: 2018-08-16 | Discharge: 2018-08-16 | Disposition: A | Discharge status: Home / Self Care | Payer: Medicare HMO | Source: Ambulatory Visit | Attending: Radiation Oncology | Admitting: Radiation Oncology

## 2018-08-16 DIAGNOSIS — C770 Secondary and unspecified malignant neoplasm of lymph nodes of head, face and neck: Secondary | ICD-10-CM | POA: Diagnosis not present

## 2018-08-16 NOTE — Progress Notes (Signed)
Patient did not show up for nutrition appointment. 

## 2018-08-17 ENCOUNTER — Ambulatory Visit: Payer: Medicare HMO

## 2018-08-18 ENCOUNTER — Ambulatory Visit
Admission: RE | Admit: 2018-08-18 | Discharge: 2018-08-18 | Disposition: A | Discharge status: Home / Self Care | Payer: Medicare HMO | Source: Ambulatory Visit | Attending: Radiation Oncology | Admitting: Radiation Oncology

## 2018-08-18 ENCOUNTER — Encounter: Payer: Self-pay | Admitting: *Deleted

## 2018-08-18 DIAGNOSIS — C770 Secondary and unspecified malignant neoplasm of lymph nodes of head, face and neck: Secondary | ICD-10-CM | POA: Diagnosis not present

## 2018-08-18 NOTE — Progress Notes (Signed)
Oncology Nurse Navigator Documentation  Met with Mr. Choyce following RT to check on his well-being.   He reported episodic nausea and vomiting of oral intake, fatigue, throat soreness. I encouraged him to take antiemetic on scheduled basis vs PRN, to hydrate with continual sips of water/Gatorade/Boost; to take analgesics as prescribed.   I reminded him of Monday's 11:45 appt with SLP Garald Balding following RT and weekly PET with Dr. Isidore Moos, reminded him of importance of HEP, follow-up with Glendell Docker.   He voiced understanding of guidance provided, my planned presence during Monday's PUT.  Gayleen Orem, RN, BSN Head & Neck Oncology Nurse [place] at [place] [phone]

## 2018-08-21 ENCOUNTER — Ambulatory Visit: Payer: Medicare HMO

## 2018-08-21 ENCOUNTER — Telehealth: Payer: Self-pay | Admitting: *Deleted

## 2018-08-21 NOTE — Telephone Encounter (Signed)
Oncology Nurse Navigator Documentation  Called Mr. [name] in follow-up to notification from [place] Mr. [name] has not confirmed ride for this morning's RT.  No answer, unable to LVMM b/c mailbox full.    Gayleen Orem, RN, BSN Head & Neck Oncology Nurse [place] at [place] [phone]

## 2018-08-21 NOTE — Telephone Encounter (Signed)
Oncology Nurse Navigator Documentation  In follow-up to this morning's missed RT and SLP appts, called Mr. [name] to check on his well-being.  He reported "just not feeling well".  I explained importance of compliance with M-F tmts to optimize benefit/outcome, noted if not feeling well enough for tmt, he minimally needs to arrive to since Dr. Isidore Moos.  He voiced understanding, indicated he had spoken with [place] Ginette Otto earlier, will arrive for tomorrow's tmt as scheduled.  Gayleen Orem, RN, BSN Head & Neck Oncology Nurse [place] at [place] [phone]

## 2018-08-22 ENCOUNTER — Ambulatory Visit
Admission: RE | Admit: 2018-08-22 | Discharge: 2018-08-22 | Disposition: A | Discharge status: Home / Self Care | Payer: Medicare HMO | Source: Ambulatory Visit | Attending: Radiation Oncology | Admitting: Radiation Oncology

## 2018-08-22 ENCOUNTER — Ambulatory Visit: Payer: Medicare HMO

## 2018-08-22 ENCOUNTER — Other Ambulatory Visit: Payer: Self-pay | Admitting: Radiation Oncology

## 2018-08-22 ENCOUNTER — Encounter: Payer: Self-pay | Admitting: *Deleted

## 2018-08-22 DIAGNOSIS — C7989 Secondary malignant neoplasm of other specified sites: Secondary | ICD-10-CM

## 2018-08-22 DIAGNOSIS — C770 Secondary and unspecified malignant neoplasm of lymph nodes of head, face and neck: Secondary | ICD-10-CM | POA: Diagnosis not present

## 2018-08-22 MED ORDER — SONAFINE EX EMUL
1.0000 "application " | Freq: Once | CUTANEOUS | Status: AC
  Administered 2018-08-22: 1 via TOPICAL

## 2018-08-22 MED ORDER — FLUCONAZOLE 100 MG PO TABS: ORAL_TABLET | ORAL | 0 refills | Status: DC

## 2018-08-22 MED FILL — FLUCONAZOLE 100 MG TAB: 100 | 21 days supply | Qty: 22 | Fill #0

## 2018-08-23 ENCOUNTER — Other Ambulatory Visit: Payer: Self-pay | Admitting: Radiation Oncology

## 2018-08-23 ENCOUNTER — Encounter: Payer: Self-pay | Admitting: *Deleted

## 2018-08-23 ENCOUNTER — Ambulatory Visit
Admission: RE | Admit: 2018-08-23 | Discharge: 2018-08-23 | Disposition: A | Discharge status: Home / Self Care | Payer: Medicare HMO | Source: Ambulatory Visit | Attending: Radiation Oncology | Admitting: Radiation Oncology

## 2018-08-23 ENCOUNTER — Ambulatory Visit: Payer: Medicare HMO

## 2018-08-23 ENCOUNTER — Inpatient Hospital Stay: Payer: Medicare HMO | Admitting: Nutrition

## 2018-08-23 DIAGNOSIS — C770 Secondary and unspecified malignant neoplasm of lymph nodes of head, face and neck: Secondary | ICD-10-CM | POA: Diagnosis not present

## 2018-08-23 DIAGNOSIS — C7989 Secondary malignant neoplasm of other specified sites: Secondary | ICD-10-CM

## 2018-08-23 LAB — BASIC METABOLIC PANEL - CANCER CENTER ONLY
Anion gap: 13 (ref 5–15)
BUN: 14 mg/dL (ref 8–23)
CO2: 26 mmol/L (ref 22–32)
Calcium: 10.4 mg/dL — ABNORMAL HIGH (ref 8.9–10.3)
Chloride: 101 mmol/L (ref 98–111)
Creatinine: 1.19 mg/dL (ref 0.61–1.24)
Glucose, Bld: 104 mg/dL — ABNORMAL HIGH (ref 70–99)
Potassium: 4.3 mmol/L (ref 3.5–5.1)
Sodium: 140 mmol/L (ref 135–145)

## 2018-08-23 MED FILL — PROMETHAZINE 25 MG TABLET: 25 | 7 days supply | Qty: 30 | Fill #0

## 2018-08-23 NOTE — Progress Notes (Signed)
Oncology Nurse Navigator Documentation  Met with Mr. Tredway prior to RT in response to call from [facility] Transportation Coordinator Allison Moore indicating he called this morning reporting difficulty breathing.   He arrived w complaint of copious secretions accumulating in back of throat, nausea d/t thickened secretions, denied SOB. He completed RT without incident after which I measured VS:  O2 99%, BP 119/86 sitting, P 87, T 98.2. He reported absence of oral intake since last evening, ie no nutritional supplement, no water. I encouraged:  Frequent sips of water/supplement as able.  Frequent salt water/baking soda rinses to manage secretions. He expressed interest in Younker suction; I informed Dr. Squire. I escorted him to Nutrition appt and then to lobby to register for labs, indicated he wd be contacted if labs concerning. He voiced understanding I will check on him tomorrow.  Rick Diehl, RN, BSN Head & Neck Oncology Nurse Navigator [facility] at [place] [phone]    

## 2018-08-23 NOTE — Progress Notes (Signed)
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 Patient reports he feels sick today and has been dry heaving and spitting up mucus. Patient was tolerating approximately 4 oral nutrition supplements daily last week but so far has not had any today. Weight has decreased significantly and was documented as 181 pounds today down from 194.6 pounds on January 8. Patient reports he has plenty of oral nutrition supplements. He is having labs today which will determine whether or not patient receives IV fluids. Patient may benefit from medication to decrease thick secretions.  Provided support and encouragement for patient to continue strategies for increased calories and protein to minimize weight loss.  We will continue to follow patient as needed.

## 2018-08-24 ENCOUNTER — Ambulatory Visit: Payer: Medicare HMO

## 2018-08-24 ENCOUNTER — Encounter: Payer: Self-pay | Admitting: *Deleted

## 2018-08-24 ENCOUNTER — Ambulatory Visit
Admission: RE | Admit: 2018-08-24 | Discharge: 2018-08-24 | Disposition: A | Discharge status: Home / Self Care | Payer: Medicare HMO | Source: Ambulatory Visit | Attending: Radiation Oncology | Admitting: Radiation Oncology

## 2018-08-24 DIAGNOSIS — C770 Secondary and unspecified malignant neoplasm of lymph nodes of head, face and neck: Secondary | ICD-10-CM | POA: Diagnosis not present

## 2018-08-24 NOTE — Progress Notes (Addendum)
Oncology Nurse Navigator Documentation  Met Mr. Mullendore prior to RT to check on his well-being. He reported continuing difficulty with secretion mgt, oral intake.  I noted Dr. Isidore Moos had order younker suction, wd be delivered by [facility] in the next day or so. He asked if he could get fluids to which I replied at this point in the day (ca 1630) unlikely in [facility] though I would check. Spoke with PA Isabella Bowens who indicated he could provide IVF tomorrow, recommended ER visit if needed today.  I discussed with Mr. Tejada, he agreed to ER.   After obtaining VS in Radiation Nursing, initiated transport by [facility] to ER.   He then stated he did not want to wait in the ER, felt he could manage until tomorrow morning, he would have friend bring him to ER tonight if felt the need. I encouraged him arrive OT for 9:15 RT tomorrow morning after which he would be evaluated further and IVF would be provided in [facility].  He voiced understanding.  Dr. Isidore Moos and RN Anderson Malta [place] informed.  Gayleen Orem, RN, BSN Head & Neck Oncology Nurse [place] at [place] [phone]

## 2018-08-24 NOTE — Progress Notes (Signed)
Oncology Nurse Navigator Documentation  Met with Mr. Scarlata s/p RT, escorted him to PUT with Dr. Isidore Moos.  Gayleen Orem, RN, BSN Head & Neck Oncology Nurse [place] at [place] [phone]

## 2018-08-25 ENCOUNTER — Other Ambulatory Visit: Payer: Self-pay | Admitting: Emergency Medicine

## 2018-08-25 ENCOUNTER — Inpatient Hospital Stay: Payer: Medicare HMO

## 2018-08-25 ENCOUNTER — Ambulatory Visit: Payer: Medicare HMO

## 2018-08-25 ENCOUNTER — Ambulatory Visit
Admission: RE | Admit: 2018-08-25 | Discharge: 2018-08-25 | Disposition: A | Discharge status: Home / Self Care | Payer: Medicare HMO | Source: Ambulatory Visit | Attending: Radiation Oncology | Admitting: Radiation Oncology

## 2018-08-25 ENCOUNTER — Inpatient Hospital Stay (HOSPITAL_BASED_OUTPATIENT_CLINIC_OR_DEPARTMENT_OTHER): Payer: Medicare HMO | Admitting: Medical

## 2018-08-25 VITALS — BP 110/82 | HR 78 | Temp 98.1°F | Resp 20 | Ht 73.0 in | Wt 177.6 lb

## 2018-08-25 DIAGNOSIS — C770 Secondary and unspecified malignant neoplasm of lymph nodes of head, face and neck: Secondary | ICD-10-CM

## 2018-08-25 DIAGNOSIS — R634 Abnormal weight loss: Secondary | ICD-10-CM

## 2018-08-25 DIAGNOSIS — R1310 Dysphagia, unspecified: Secondary | ICD-10-CM | POA: Diagnosis not present

## 2018-08-25 DIAGNOSIS — E860 Dehydration: Secondary | ICD-10-CM

## 2018-08-25 DIAGNOSIS — Z1329 Encounter for screening for other suspected endocrine disorder: Secondary | ICD-10-CM

## 2018-08-25 DIAGNOSIS — C7989 Secondary malignant neoplasm of other specified sites: Secondary | ICD-10-CM

## 2018-08-25 DIAGNOSIS — R000 Tachycardia, unspecified: Secondary | ICD-10-CM | POA: Insufficient documentation

## 2018-08-25 DIAGNOSIS — R112 Nausea with vomiting, unspecified: Secondary | ICD-10-CM | POA: Insufficient documentation

## 2018-08-25 DIAGNOSIS — R55 Syncope and collapse: Secondary | ICD-10-CM | POA: Insufficient documentation

## 2018-08-25 DIAGNOSIS — C801 Malignant (primary) neoplasm, unspecified: Secondary | ICD-10-CM

## 2018-08-25 DIAGNOSIS — K047 Periapical abscess without sinus: Secondary | ICD-10-CM | POA: Diagnosis not present

## 2018-08-25 DIAGNOSIS — R1319 Other dysphagia: Secondary | ICD-10-CM

## 2018-08-25 LAB — CMP (CANCER CENTER ONLY)
ALBUMIN: 4 g/dL (ref 3.5–5.0)
ALK PHOS: 66 U/L (ref 38–126)
ALT: 30 U/L (ref 0–44)
ANION GAP: 13 (ref 5–15)
AST: 25 U/L (ref 15–41)
BUN: 16 mg/dL (ref 8–23)
CO2: 23 mmol/L (ref 22–32)
Calcium: 10.3 mg/dL (ref 8.9–10.3)
Chloride: 104 mmol/L (ref 98–111)
Creatinine: 1.08 mg/dL (ref 0.61–1.24)
GFR, Est AFR Am: >60 mL/min (ref >60)
GFR, Estimated: >60 mL/min (ref >60)
GLUCOSE: 99 mg/dL (ref 70–99)
Potassium: 4.3 mmol/L (ref 3.5–5.1)
Sodium: 140 mmol/L (ref 135–145)
Total Bilirubin: 0.6 mg/dL (ref 0.3–1.2)
Total Protein: 7.8 g/dL (ref 6.5–8.1)

## 2018-08-25 LAB — CBC WITH DIFFERENTIAL (CANCER CENTER ONLY)
Abs Immature Granulocytes: 0.01 10*3/uL (ref 0.00–0.07)
Basophils Absolute: 0 10*3/uL (ref 0.0–0.1)
Basophils Relative: 0 %
Eosinophils Absolute: 0.1 10*3/uL (ref 0.0–0.5)
Eosinophils Relative: 2 %
HCT: 48.9 % (ref 39.0–52.0)
Hemoglobin: 16.4 g/dL (ref 13.0–17.0)
IMMATURE GRANULOCYTES: 0 %
Lymphocytes Relative: 11 %
Lymphs Abs: 0.5 10*3/uL — ABNORMAL LOW (ref 0.7–4.0)
MCH: 29.9 pg (ref 26.0–34.0)
MCHC: 33.5 g/dL (ref 30.0–36.0)
MCV: 89.1 fL (ref 80.0–100.0)
Monocytes Absolute: 0.5 10*3/uL (ref 0.1–1.0)
Monocytes Relative: 9 %
Neutro Abs: 4 10*3/uL (ref 1.7–7.7)
Neutrophils Relative %: 78 %
PLATELETS: 190 10*3/uL (ref 150–400)
RBC: 5.49 MIL/uL (ref 4.22–5.81)
RDW: 13.1 % (ref 11.5–15.5)
WBC Count: 5.1 10*3/uL (ref 4.0–10.5)
nRBC: 0 % (ref 0.0–0.2)

## 2018-08-25 LAB — T4, FREE: Free T4: 1.04 ng/dL (ref 0.82–1.77)

## 2018-08-25 LAB — TSH: TSH: 0.763 u[IU]/mL (ref 0.320–4.118)

## 2018-08-25 MED ORDER — SODIUM CHLORIDE 0.9 % IV SOLN
Freq: Once | INTRAVENOUS | Status: AC
  Administered 2018-08-25: 11:00:00 via INTRAVENOUS
  Filled 2018-08-25: qty 250

## 2018-08-25 MED ORDER — ONDANSETRON 4 MG PO TBDP: 4.0000 mg | ORAL_TABLET | Freq: Three times a day (TID) | ORAL | 0 refills | Status: DC | PRN

## 2018-08-25 MED ORDER — ALUM & MAG HYDROXIDE-SIMETH 200-200-20 MG/5ML PO SUSP
30.0000 mL | Freq: Once | ORAL | Status: AC
  Administered 2018-08-25: 30 mL via ORAL

## 2018-08-25 MED ORDER — ALUM & MAG HYDROXIDE-SIMETH 200-200-20 MG/5ML PO SUSP
ORAL | Status: AC
  Filled 2018-08-25: qty 30

## 2018-08-25 MED ORDER — ONDANSETRON HCL 4 MG/2ML IJ SOLN
INTRAMUSCULAR | Status: AC
  Filled 2018-08-25: qty 2

## 2018-08-25 MED ORDER — DEXAMETHASONE SODIUM PHOSPHATE 10 MG/ML IJ SOLN
10.0000 mg | Freq: Once | INTRAMUSCULAR | Status: AC
  Administered 2018-08-25: 10 mg via INTRAVENOUS

## 2018-08-25 MED ORDER — LIDOCAINE VISCOUS HCL 2 % MT SOLN: 5.0000 mL | OROMUCOSAL | 1 refills | Status: DC | PRN

## 2018-08-25 MED ORDER — SODIUM CHLORIDE 0.9 % IV SOLN: 10.0000 mg | Freq: Once | INTRAVENOUS | Status: DC

## 2018-08-25 MED ORDER — LIDOCAINE VISCOUS HCL 2 % MT SOLN
15.0000 mL | Freq: Once | OROMUCOSAL | Status: AC
  Administered 2018-08-25: 15 mL via ORAL
  Filled 2018-08-25: qty 15

## 2018-08-25 MED ORDER — OXYCODONE HCL 5 MG/5ML PO SOLN: 5.0000 mg | Freq: Four times a day (QID) | ORAL | 0 refills | Status: DC | PRN

## 2018-08-25 MED ORDER — ONDANSETRON HCL 4 MG/2ML IJ SOLN
4.0000 mg | Freq: Once | INTRAMUSCULAR | Status: AC
  Administered 2018-08-25: 4 mg via INTRAVENOUS

## 2018-08-25 MED ORDER — DEXAMETHASONE SODIUM PHOSPHATE 10 MG/ML IJ SOLN
INTRAMUSCULAR | Status: AC
  Filled 2018-08-25: qty 1

## 2018-08-25 MED ORDER — SUCRALFATE 1 G PO TABS: 1.0000 g | ORAL_TABLET | Freq: Three times a day (TID) | ORAL | 1 refills | Status: DC

## 2018-08-25 MED ORDER — AMOXICILLIN-POT CLAVULANATE 600-42.9 MG/5ML PO SUSR: 600.0000 mg | Freq: Two times a day (BID) | ORAL | 0 refills | Status: DC

## 2018-08-25 MED FILL — AMOX-CLAV 600-42.9 MG/5 ML: 600-42.9 | 7 days supply | Qty: 75 | Fill #0

## 2018-08-25 MED FILL — LIDOCAINE 2% VISCOUS SOLN: 2 | 3 days supply | Qty: 100 | Fill #0

## 2018-08-25 MED FILL — oxyCODONE HCL 5 MG/5ML SOLN: 5 | 7 days supply | Qty: 150 | Fill #0

## 2018-08-25 MED FILL — SUCRALFATE 1 GM TABLET: 1 | 30 days supply | Qty: 120 | Fill #0

## 2018-08-25 MED FILL — ONDANSETRON ODT 4 MG TABLET: 4 | 6 days supply | Qty: 20 | Fill #0

## 2018-08-25 NOTE — Progress Notes (Signed)
Pt given 1.5 L NS with 4 mg IV zofran and GI cocktail.  Pt was able to eat some food but had a small episode of N/V.  Given 10 mg IV decadron and 500 ml NS.  Reports feeling much better with decrease in nausea & fatigue.  Pt appears more alert & VSS at end of infusion.  Pt advised by PA Lucianne Lei to push oral fluids and nausea medication at home this weekend and to come back for IVF on Monday after radiation tx if needed.  Verbalized understanding of this and to call with any further questions/concerns.

## 2018-08-25 NOTE — Patient Instructions (Signed)

## 2018-08-28 ENCOUNTER — Other Ambulatory Visit: Payer: Self-pay

## 2018-08-28 ENCOUNTER — Telehealth: Payer: Self-pay | Admitting: *Deleted

## 2018-08-28 ENCOUNTER — Ambulatory Visit
Admission: RE | Admit: 2018-08-28 | Discharge: 2018-08-28 | Disposition: A | Discharge status: Home / Self Care | Payer: Medicare HMO | Source: Ambulatory Visit | Attending: Radiation Oncology | Admitting: Radiation Oncology

## 2018-08-28 ENCOUNTER — Ambulatory Visit: Payer: Medicare HMO

## 2018-08-28 ENCOUNTER — Inpatient Hospital Stay: Payer: Medicare HMO

## 2018-08-28 ENCOUNTER — Other Ambulatory Visit: Payer: Self-pay | Admitting: Radiation Oncology

## 2018-08-28 VITALS — BP 128/78 | HR 84 | Temp 98.3°F | Resp 18

## 2018-08-28 DIAGNOSIS — E860 Dehydration: Secondary | ICD-10-CM

## 2018-08-28 DIAGNOSIS — C7989 Secondary malignant neoplasm of other specified sites: Secondary | ICD-10-CM

## 2018-08-28 DIAGNOSIS — C770 Secondary and unspecified malignant neoplasm of lymph nodes of head, face and neck: Secondary | ICD-10-CM | POA: Diagnosis not present

## 2018-08-28 MED ORDER — CENTRUM PO LIQD: 15.0000 mL | Freq: Every day | ORAL | 3 refills | Status: DC

## 2018-08-28 MED ORDER — SODIUM CHLORIDE 0.9 % IV SOLN
Freq: Once | INTRAVENOUS | Status: AC
  Administered 2018-08-28: 11:00:00 via INTRAVENOUS
  Filled 2018-08-28: qty 250

## 2018-08-28 MED FILL — CENTRUM LIQD: 15 days supply | Qty: 236 | Fill #0

## 2018-08-28 NOTE — Patient Instructions (Signed)

## 2018-08-28 NOTE — Telephone Encounter (Signed)
Oncology Nurse Navigator Documentation  Rec'd call from Mr. Loescher requesting rescheduling of 9:15 RT to later today b/c not able to manage early arrival. He reported:  Able to drink only water, Gatorade and Dr. Malachi Bonds over the weekend, thicker liquids/soft foods trigger gag reflex d/t thickened saliva.  Felt much better s/p IVF last Friday, requested additional today. I indicated I wd coordinate scheduling of IVF, inform [facility] of later arrival, reschedule transportation with [facility] Transportation Coordinator [employer].  Spoke w/ [facility] RN Lattie Haw who indicated IVF can be provided 10:30.  I informed Ebony Hail who stated pick-up will be scheduled for 10:00.  Informed [facility] 2 RTT he will be available for tmt ca. 12:45.  Returned call x3 to Mr. Baray to inform him of above, unable to reach, unable to LVMM b/c mailbox full.  Will continue to call.  Gayleen Orem, RN, BSN Head & Neck Oncology Nurse [place] at [place] [phone]

## 2018-08-28 NOTE — Progress Notes (Signed)
Pt presents today for more IVF as discussed during his last visit on 1/24 in [facility].  Given 1 L IVF today and snacks, tolerated well.  Reports feeling better than last week.

## 2018-08-28 NOTE — Telephone Encounter (Signed)
Oncology Nurse Navigator Documentation  Spoke with [name], informed him of 10:00 ride to [facility] for 10:30 IVF after which he will received RT and weekly PUT with Dr. Isidore Moos.  He voiced understanding.  Gayleen Orem, RN, BSN Head & Neck Oncology Nurse [place] at [place] [phone]

## 2018-08-28 NOTE — Progress Notes (Signed)
Normal

## 2018-08-29 ENCOUNTER — Ambulatory Visit
Admission: RE | Admit: 2018-08-29 | Discharge: 2018-08-29 | Disposition: A | Discharge status: Home / Self Care | Payer: Medicare HMO | Source: Ambulatory Visit | Attending: Radiation Oncology | Admitting: Radiation Oncology

## 2018-08-29 ENCOUNTER — Inpatient Hospital Stay: Payer: Medicare HMO | Admitting: Nutrition

## 2018-08-29 ENCOUNTER — Telehealth: Payer: Self-pay | Admitting: Radiation Oncology

## 2018-08-29 ENCOUNTER — Ambulatory Visit: Payer: Medicare HMO

## 2018-08-29 ENCOUNTER — Encounter: Payer: Self-pay | Admitting: *Deleted

## 2018-08-29 DIAGNOSIS — R1312 Dysphagia, oropharyngeal phase: Secondary | ICD-10-CM | POA: Diagnosis present

## 2018-08-29 DIAGNOSIS — C770 Secondary and unspecified malignant neoplasm of lymph nodes of head, face and neck: Secondary | ICD-10-CM | POA: Diagnosis not present

## 2018-08-29 NOTE — Progress Notes (Signed)
Oncology Nurse Navigator Documentation  Met with Mr. [name] upon his arrival for H&N [place] following RT.  He was unaccompanied.  He voiced understanding he is attending to have follow-up with Nutrition and SLP.  Spoke with him at end of [facility], addressed questions. I encouraged him to call me with needs/concerns.  Gayleen Orem, RN, BSN Head & Neck Oncology [place] at [place] [phone]

## 2018-08-29 NOTE — Therapy (Signed)
[facility] [address] [place], [place], [address] Phone: [phone]   Fax:  [phone]  Speech Language Pathology Treatment  Patient Details  Name: [name] MRN: [id number] Date of Birth: Sep 03, 1950 Referring Provider (SLP): Eppie Gibson, MD   Encounter Date: 08/29/2018  End of Session - 08/29/18 1909    Visit Number  2    Number of Visits  4    Date for SLP Re-Evaluation  10/09/18    SLP Start Time  25    SLP Stop Time   1200    SLP Time Calculation (min)  45 min    Activity Tolerance  Patient tolerated treatment well       Past Medical History:  Diagnosis Date  . Alcohol abuse    until age 89  . Arthritis   . Bell's palsy 05/2017  . BPH (benign prostatic hyperplasia)   . Chronic hepatitis C without hepatic coma ([place]) FOLLOWED BY INFECTOUS DISEASE DR COMER   POSITIVE ANTIBODY TEST THIS YEAR 2016--  CURRENT TX ON HARVONI PO- completeed 2016  . Chronic low back pain   . Collapsed lung    right .Marland Kitchen..15 yrs ago, fell and hit some bricks  . DDD (degenerative disc disease), lumbosacral   . Depression    no meds now- med used for bladder  . 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 ED (erectile dysfunction)   . Foley catheter in place    removed, post lumbar surgery- cath left in throughh rehab- had 2 UTI's  . GERD (gastroesophageal reflux disease)    hx none in long time  . History of cocaine abuse ([place])    per pt quit and last used 06-03-2013  . Lymphoma ([place])   . Urinary retention     Past Surgical History:  Procedure Laterality Date  . APPENDECTOMY  1980's  . BACK SURGERY     lower back x2  . [place]  . CLOSED REDUCTION AND INTRAMAXILLARY FIXATION BILATERAL MENTAL FX'S/  PLACEMENT MAXILLARY STENT/ APERTURE WIRE PLACEMENT  01-04-2000  . DIRECT LARYNGOSCOPY N/A 04/21/2018   Procedure: DIRECT LARYNGOSCOPY WITH BIOPSY OF TONGUE BASE AND NASOPHARYNX;   Surgeon: Jerrell Belfast, MD;  Location: [facility];  Service: ENT;  Laterality: N/A;  Direct laryngoscopy with biopsy of tongue base and nasopharynx  . JOINT REPLACEMENT    . POSTERIOR LUMBAR FUSION  03-05-2015   laminectomy /  nerve decompression--  L4 -- S1  . RADICAL NECK DISSECTION Right 05/31/2018   Procedure: RADICAL MODIFIED RADICAL NECK DISSECTION;  Surgeon: Jerrell Belfast, MD;  Location: [facility];  Service: ENT;  Laterality: Right;  . RADIOLOGY WITH ANESTHESIA N/A 08/08/2018   Procedure: MRI CERVICAL SPINE WITHOUT CONTRAST;  Surgeon: Radiologist, Medication, MD;  Location: [facility];  Service: Radiology;  Laterality: N/A;  . TONSILLECTOMY  04/21/2018   Procedure: RIGHT TONSILLECTOMY;  Surgeon: Jerrell Belfast, MD;  Location: [facility];  Service: ENT;;  . TONSILLECTOMY    . TOTAL KNEE ARTHROPLASTY Left 09/21/2013   Procedure: LEFT TOTAL KNEE ARTHROPLASTY;  Surgeon: Yvette Rack., MD;  Location: [facility];  Service: Orthopedics;  Laterality: Left;  . TRANSURETHRAL RESECTION OF PROSTATE N/A 06/16/2015   Procedure: TRANSURETHRAL RESECTION OF THE PROSTATE WITH GYRUS INSTRUMENTS;  Surgeon: Franchot Gallo, MD;  Location: [facility];  Service: Urology;  Laterality: N/A;    There were no vitals filed for this visit.  Subjective Assessment -  08/29/18 1129    Subjective  "When I have that boost or ensure it just comes back up - worse with the boost."    Currently in Pain?  No/denies            ADULT SLP TREATMENT - 08/29/18 1131      General Information   Behavior/Cognition  Alert;Cooperative;Pleasant mood      Treatment Provided   Treatment provided  Dysphagia      Dysphagia Treatment   Temperature Spikes Noted  No    Respiratory Status  Room air    Oral Cavity - Dentition  Missing dentition    Treatment Methods  Skilled observation;Therapeutic exercise;Patient/caregiver education;Compensation strategy training    Patient observed directly with PO's  Yes    Type of PO's  observed  Thin liquids    Feeding  Able to feed self    Liquids provided via  Cup;Straw    Oral Phase Signs & Symptoms  --    none noted   Pharyngeal Phase Signs & Symptoms  --   none noted   Other treatment/comments  Pt stated he was performing HEP x2/day however could not complete any exercises from HEP. SLP used mod cues consistently faded to consistent min cues for exercise procedure. Pt without overt s/s aspiration with liquids today, reported slight nasal regurgitation with Ensure x1/3 sips. No overt s/s aspiration PNA.       Pain Assessment   Pain Assessment  No/denies pain      Assessment / Recommendations / Plan   Plan  Continue with current plan of care      Dysphagia Recommendations   Diet recommendations  --   as tolerated/recommended on modified barium swallow   Liquids provided via  Cup    Medication Administration  --   as tolerated     Progression Toward Goals   Progression toward goals  Not progressing toward goals (comment)   SLP questions pt's adherence to HEP      SLP Education - 08/29/18 1909    Education Details  HEP procedure, small sips, flush Ensure with thin liquids    Person(s) Educated  Patient    Methods  Explanation;Demonstration;Verbal cues    Comprehension  Verbalized understanding;Returned demonstration;Verbal cues required;Need further instruction       SLP Short Term Goals - 08/29/18 1914      SLP SHORT TERM GOAL #1   Title  pt will complete HEP with rare min A over two sessions    Time  1    Period  --   visits, for all STGs   Status  On-going      SLP SHORT TERM GOAL #2   Title  pt will tell SLP why he is completing HEP     Status  Not Met      SLP SHORT TERM GOAL #3   Title  pt will tell SLP precautions from modified 07-10-18 with min questioning cues    Status  Not Met       SLP Long Term Goals - 08/29/18 1915      SLP LONG TERM GOAL #1   Title  pt will complete HEP with occasional min A over two sessions    Time  2     Period  --   visits   Status  Revised   from modified independence     SLP LONG TERM GOAL #2   Title  pt will tell SLP 3 overt s/s of  aspiration PNA with modified independence    Time  2    Period  --   visits   Status  On-going      SLP LONG TERM GOAL #3   Title  pt will tell SLP how a food journal can foster a quicker return to a more-normalized diet, over two visits    Time  3    Period  --   visits   Status  On-going       Plan - 08/29/18 1910    Clinical Impression Statement  Pt did not show to previously scheduled ST visit last Monday, arrived during multi-disciplinary cancer clinic today. Pt with pharyngeal dysphagia ID'd December 2019 via modified [facility]) -see that report under "imaging" for details. Today pt stated he completed HEP BID however when asked to perform exercises did not perform any exercises prescribed to pt. The probability of swallowing difficulty continues to exist with the initiation of radiation therapy. Pt will need to cont to be followed by SLP for regular assessment of accurate HEP completion as well as for safety with POs both during and following treatment/s.    Speech Therapy Frequency  --   approx once every four weeks   Duration  --   4 visits   Treatment/Interventions  Aspiration precaution training;Pharyngeal strengthening exercises;Diet toleration management by SLP;Compensatory techniques;Internal/external aids;SLP instruction and feedback;Patient/family education;Trials of upgraded texture/liquids;Cueing hierarchy;Environmental controls    Potential to Achieve Goals  Good    SLP Home Exercise Plan  provided today    Consulted and Agree with Plan of Care  Patient       Patient will benefit from skilled therapeutic intervention in order to improve the following deficits and impairments:   Dysphagia, oropharyngeal phase    Problem List Patient Active Problem List   Diagnosis Date Noted  . 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 Rectal bleeding 05/30/2018  . Dark stools 05/30/2018  . NSAID long-term use 05/30/2018  . Secondary malignancy of lymph nodes of head, face and neck ([place]) 05/12/2018  . 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 Cervical stenosis of spinal canal 05/10/2018  . Neck mass 04/21/2018  . Liver fibrosis 04/05/2018  . Fusion of lumbar spine 02/07/2017  . Constipation due to opioid therapy 01/27/2017  . Lumbar stenosis with neurogenic claudication 01/24/2017  . Enlarged prostate with urinary obstruction 06/16/2015  . Lumbar degenerative disc disease 03/05/2015  . Chronic hepatitis C without hepatic coma ([place]) 12/09/2014  . Chronic low back pain 01/17/2014  . Osteoarthritis of left knee 09/21/2013  . Alcohol dependence ([place]) 06/12/2013  . Cocaine dependence ([place]) 06/12/2013  . Substance induced mood disorder ([place]) 06/12/2013    [facility] ,[place], [place]  08/29/2018, 7:16 PM  [facility] [address] [place], [place], [address] Phone: [phone]   Fax:  [phone]   Name: CHRISTIPHER RIEGER MRN: [id number] Date of Birth: 20-May-1951

## 2018-08-29 NOTE — Progress Notes (Signed)
Nutrition follow-up completed with patient during head and neck clinic.   He is status post radiation therapy for metastatic head and neck cancer with unknown primary. Weight documented as 175.8 pounds today decreased from 194.6 pounds January 8. Patient has lost 14% body weight over 6 weeks which is significant. Patient meets guidelines for severe malnutrition in chronic illness secondary to greater than 7-1/2% weight loss over 3 months and less than 75% energy intake for greater than 1 month. Patient is receiving IV fluids 2 times a week. He reports he has not yet received his suction machine for thick secretions. Complains he cannot swallow much of anything.  He is spitting up mucus and dry heaving.   Labs reviewed and were within normal limits. Reports he has a headache today.  Estimated nutrition needs: 2400-2600 cal, 112-125 g protein, 2.5 L fluid.  Nutrition diagnosis: Inadequate oral intake continues.  Intervention: Reinforced importance of rinsing his mouth with baking soda and salt water to help thin secretions. Encouraged him to spit thick mucus as tolerated. Encourage patient to continue adequate hydration. Reviewed importance of thin liquids with calories and protein. Recommended patient use milk or water to thin down Ensure Enlive to consistency he can swallow.  Explained he requires added vitamins and minerals to provide adequate healing as well as significant calories and protein. He can continue to drink milk as tolerated. Provided an additional case of Ensure Enlive.  Recommended minimum of 4 bottles daily.  Patient requires 7 bottles daily to meet greater than 90% estimated nutrition needs. Patient agreeable to plan.  Monitoring, evaluation, goals: Patient will tolerate increased calories and protein to minimize further weight loss.  Next visit: Will continue to follow with patient as needed.  **Disclaimer: This note was dictated with voice recognition software.  Similar sounding words can inadvertently be transcribed and this note may contain transcription errors which may not have been corrected upon publication of note.**

## 2018-08-30 ENCOUNTER — Ambulatory Visit: Payer: Medicare HMO

## 2018-08-30 ENCOUNTER — Ambulatory Visit
Admission: RE | Admit: 2018-08-30 | Discharge: 2018-08-30 | Disposition: A | Discharge status: Home / Self Care | Payer: Medicare HMO | Source: Ambulatory Visit | Attending: Radiation Oncology | Admitting: Radiation Oncology

## 2018-08-30 DIAGNOSIS — C770 Secondary and unspecified malignant neoplasm of lymph nodes of head, face and neck: Secondary | ICD-10-CM | POA: Diagnosis not present

## 2018-08-30 NOTE — Progress Notes (Signed)
Symptoms Management Clinic Progress Note   [name] [id number] Oct 03, 1950 68 y.o.  [name] is managed by Dr. Eppie Gibson  Actively treated with chemotherapy/immunotherapy/hormonal therapy: no  Current Therapy: Radiation therapy    Assessment: Plan:    Dehydration - Plan: 0.9 %  sodium chloride infusion, dexamethasone (DECADRON) injection 10 mg  Non-intractable vomiting with nausea, unspecified vomiting type - Plan: ondansetron (ZOFRAN) injection 4 mg, dexamethasone (DECADRON) injection 10 mg, DISCONTINUED: dexamethasone (DECADRON) 10 mg in sodium chloride 0.9 % 50 mL IVPB  Esophageal dysphagia - Plan: alum & mag hydroxide-simeth (MAALOX/MYLANTA) 200-200-20 MG/5ML suspension 30 mL, lidocaine (XYLOCAINE) 2 % viscous mouth solution 15 mL, ondansetron (ZOFRAN ODT) 4 MG disintegrating tablet, oxyCODONE (ROXICODONE) 5 MG/5ML solution, sucralfate (CARAFATE) 1 g tablet, lidocaine (XYLOCAINE) 2 % solution  Abscessed tooth - Plan: amoxicillin-clavulanate (AUGMENTIN ES-600) 600-42.9 MG/5ML suspension  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  Non-intractable nausea and vomiting: Patient was given Zofran 4 mg IV x1, Decadron 10 mg IV and was given a prescription for Zofran ODT.  Esophageal dysphasia: The patient was given a GI cocktail x1 and was given a prescription for Carafate, oxycodone liquid, and viscous lidocaine.  The patient is recently been treated for an abscessed tooth but is unable to swallow his antibiotic tablets.  He was given Augmentin 600-42.9 mg / 5 mL suspension which he will take twice daily.  Metastatic squamous cell carcinoma of the head neck: The patient continues to be treated with radiation therapy under the direction of Dr. Eppie Gibson.  Please see After Visit Summary for patient specific instructions.  Future Appointments  Date Time Provider [place]    08/30/2018  5:00 PM [facility] None  08/31/2018  4:45 PM [facility] [id number] [facility] None  09/01/2018  4:45 PM [facility] [id number] [facility] None  09/12/2018  2:20 PM Eppie Gibson, MD [facility] None    No orders of the defined types were placed in this encounter.      Subjective:   Patient ID:  [name] is a 68 y.o. (DOB 10/29/1950) male.  Chief Complaint:  Chief Complaint  Patient presents with  . Fatigue    HPI [name]   is a 68 year old male with a history of his metastatic squamous cell carcinoma of the head neck who was treated with radiation therapy under the care of Dr. Eppie Gibson.  He has now developed fatigue, anorexia, nausea and vomiting, dizziness and weakness, thick secretions from his throat, and difficulty swallowing due to dysphasia.  He denies an fevers, chills, sweats, diarrhea, constipation, shortness of breath, or chest pain.  Medications: I have reviewed the patient's current medications.  Allergies: No Known Allergies  Past Medical History:  Diagnosis Date  . Alcohol abuse    until age 27  . Arthritis   . Bell's palsy 05/2017  . BPH (benign prostatic hyperplasia)   . Chronic hepatitis C without hepatic coma ([place]) FOLLOWED BY INFECTOUS DISEASE DR COMER   POSITIVE ANTIBODY TEST THIS YEAR 2016--  CURRENT TX ON HARVONI PO- completeed 2016  . Chronic low back pain   . Collapsed lung    right .Marland Kitchen..15 yrs ago, fell and hit some bricks  . DDD (degenerative disc disease), lumbosacral   . Depression    no meds now- med used for bladder  . Diabetes mellitus without complication ([place])    Type II  .  ED (erectile dysfunction)   . Foley catheter in place    removed, post lumbar surgery- cath left in throughh rehab- had 2 UTI's  . GERD (gastroesophageal reflux disease)    hx none in long time  . History of cocaine abuse ([place])    per pt quit and last used 06-03-2013  . Lymphoma ([place])   . Urinary retention      Past Surgical History:  Procedure Laterality Date  . APPENDECTOMY  1980's  . BACK SURGERY     lower back x2  . [place]  . CLOSED REDUCTION AND INTRAMAXILLARY FIXATION BILATERAL MENTAL FX'S/  PLACEMENT MAXILLARY STENT/ APERTURE WIRE PLACEMENT  01-04-2000  . DIRECT LARYNGOSCOPY N/A 04/21/2018   Procedure: DIRECT LARYNGOSCOPY WITH BIOPSY OF TONGUE BASE AND NASOPHARYNX;  Surgeon: Jerrell Belfast, MD;  Location: [facility];  Service: ENT;  Laterality: N/A;  Direct laryngoscopy with biopsy of tongue base and nasopharynx  . JOINT REPLACEMENT    . POSTERIOR LUMBAR FUSION  03-05-2015   laminectomy /  nerve decompression--  L4 -- S1  . RADICAL NECK DISSECTION Right 05/31/2018   Procedure: RADICAL MODIFIED RADICAL NECK DISSECTION;  Surgeon: Jerrell Belfast, MD;  Location: [facility];  Service: ENT;  Laterality: Right;  . RADIOLOGY WITH ANESTHESIA N/A 08/08/2018   Procedure: MRI CERVICAL SPINE WITHOUT CONTRAST;  Surgeon: Radiologist, Medication, MD;  Location: [facility];  Service: Radiology;  Laterality: N/A;  . TONSILLECTOMY  04/21/2018   Procedure: RIGHT TONSILLECTOMY;  Surgeon: Jerrell Belfast, MD;  Location: [facility];  Service: ENT;;  . TONSILLECTOMY    . TOTAL KNEE ARTHROPLASTY Left 09/21/2013   Procedure: LEFT TOTAL KNEE ARTHROPLASTY;  Surgeon: Yvette Rack., MD;  Location: [facility];  Service: Orthopedics;  Laterality: Left;  . TRANSURETHRAL RESECTION OF PROSTATE N/A 06/16/2015   Procedure: TRANSURETHRAL RESECTION OF THE PROSTATE WITH GYRUS INSTRUMENTS;  Surgeon: Franchot Gallo, MD;  Location: [facility];  Service: Urology;  Laterality: N/A;    Family History  Problem Relation Age of Onset  . Liver cancer Paternal Grandmother   . Bone cancer Paternal Grandfather   . Stomach cancer Paternal Uncle   . Colon cancer Neg Hx   . Esophageal cancer Neg Hx   . Pancreatic cancer Neg Hx   . Inflammatory bowel disease Neg Hx   . Rectal cancer Neg Hx     Social History    Socioeconomic History  . Marital status: Divorced    Spouse name: Not on file  . Number of children: Not on file  . Years of education: Not on file  . Highest education level: Not on file  Occupational History  . Occupation: [employer]  . Financial resource strain: Not on file  . Food insecurity:    Worry: Not on file    Inability: Not on file  . Transportation needs:    Medical: Yes    Non-medical: Yes  Tobacco Use  . Smoking status: Never Smoker  . Smokeless tobacco: Never Used  Substance and Sexual Activity  . Alcohol use: Not Currently    Comment: occ, heavy drinker prior to age 62, completely stopped 14 months ago; none as off 05/29/18  . Drug use: Not Currently    Types: "Crack" cocaine    Comment: per pt quit and last used crack 04/16/17  . Sexual activity: Not on file    Comment: declined condoms  Lifestyle  . Physical activity:    Days per week:  Not on file    Minutes per session: Not on file  . Stress: Not on file  Relationships  . Social connections:    Talks on phone: Not on file    Gets together: Not on file    Attends religious service: Not on file    Active member of club or organization: Not on file    Attends meetings of clubs or organizations: Not on file    Relationship status: Not on file  . Intimate partner violence:    Fear of current or ex partner: No    Emotionally abused: No    Physically abused: No    Forced sexual activity: No  Other Topics Concern  . Not on file  Social History Narrative  . Not on file    Past Medical History, Surgical history, Social history, and Family history were reviewed and updated as appropriate.   Please see review of systems for further details on the patient's review from today.   Review of Systems:  Review of Systems  Constitutional: Positive for appetite change and fatigue. Negative for chills, diaphoresis and fever.  HENT: Positive for sore throat and trouble swallowing.   Respiratory:  Negative for cough, choking, shortness of breath and wheezing.   Cardiovascular: Negative for chest pain and palpitations.  Gastrointestinal: Positive for nausea and vomiting. Negative for constipation and diarrhea.  Genitourinary: Negative for decreased urine volume.  Neurological: Positive for dizziness and weakness. Negative for headaches.    Objective:   Physical Exam:  BP 110/82   Pulse 78   Temp 98.1 F (36.7 C) (Oral)   Resp 20   Ht 6\' 1"  (1.854 m)   Wt 177 lb 9.6 oz (80.6 kg)   SpO2 98%   BMI 23.43 kg/m  ECOG: 1  Physical Exam Constitutional:      General: He is not in acute distress.    Appearance: He is not diaphoretic.  HENT:     Head: Normocephalic and atraumatic.  Neck:     Musculoskeletal: Normal range of motion and neck supple.  Cardiovascular:     Rate and Rhythm: Normal rate and regular rhythm.     Heart sounds: Normal heart sounds. No murmur. No friction rub. No gallop.   Pulmonary:     Effort: Pulmonary effort is normal. No respiratory distress.     Breath sounds: Normal breath sounds. No stridor. No wheezing or rales.  Abdominal:     General: Bowel sounds are normal. There is no distension.     Tenderness: There is no abdominal tenderness. There is no guarding.  Lymphadenopathy:     Cervical: No cervical adenopathy.  Skin:    General: Skin is warm and dry.     Findings: No erythema or rash.  Neurological:     Mental Status: He is alert.     Gait: Gait normal.  Psychiatric:        Mood and Affect: Mood normal.        Behavior: Behavior normal.        Thought Content: Thought content normal.        Judgment: Judgment normal.     Lab Review:     Component Value Date/Time   NA 140 08/25/2018 0939   K 4.3 08/25/2018 0939   CL 104 08/25/2018 0939   CO2 23 08/25/2018 0939   GLUCOSE 99 08/25/2018 0939   BUN 16 08/25/2018 0939   CREATININE 1.08 08/25/2018 0939   CREATININE 1.04 01/26/2016 1025  CALCIUM 10.3 08/25/2018 0939   PROT 7.8  08/25/2018 0939   ALBUMIN 4.0 08/25/2018 0939   AST 25 08/25/2018 0939   ALT 30 08/25/2018 0939   ALKPHOS 66 08/25/2018 0939   BILITOT 0.6 08/25/2018 0939   GFRNONAA >60 08/25/2018 0939   GFRNONAA 76 01/26/2016 1025   GFRAA >60 08/25/2018 0939   GFRAA 88 01/26/2016 1025       Component Value Date/Time   WBC 5.1 08/25/2018 0939   WBC 8.1 06/06/2018 1336   RBC 5.49 08/25/2018 0939   HGB 16.4 08/25/2018 0939   HCT 48.9 08/25/2018 0939   PLT 190 08/25/2018 0939   MCV 89.1 08/25/2018 0939   MCH 29.9 08/25/2018 0939   MCHC 33.5 08/25/2018 0939   RDW 13.1 08/25/2018 0939   LYMPHSABS 0.5 (L) 08/25/2018 0939   MONOABS 0.5 08/25/2018 0939   EOSABS 0.1 08/25/2018 0939   BASOSABS 0.0 08/25/2018 0939   -------------------------------  Imaging from last 24 hours (if applicable):  Radiology interpretation: Mr Cervical Spine Wo Contrast  Result Date: 08/08/2018 CLINICAL DATA:  Cervical spinal stenosis. History of head neck cancer. MRI performed with general anesthesia for claustrophobia. EXAM: MRI CERVICAL SPINE WITHOUT CONTRAST TECHNIQUE: Multiplanar, multisequence MR imaging of the cervical spine was performed. No intravenous contrast was administered. COMPARISON:  CT neck 02/21/2018 FINDINGS: Alignment: 5 mm retrolisthesis C3-4 unchanged from the prior CT. Slight retrolisthesis C5-6 and C6-7. Vertebrae: ACDF with solid fusion C4-5. Negative for fracture or mass. Cord: Mild cord hyperintensity at C3-4 related to cord compression from stenosis. No other cord signal abnormality. Posterior Fossa, vertebral arteries, paraspinal tissues: The patient is intubated. No paraspinous mass or fluid collection. Disc levels: C2-3: Mild disc bulging without stenosis. C3-4: 5 mm retrolisthesis with severe disc degeneration and spurring. Large posterior osteophyte is present with cord flattening and moderate spinal stenosis. Mild cord hyperintensity at this level. Moderate foraminal stenosis bilaterally. C4-5:  ACDF with anterior plate and screws.  Negative for stenosis. C5-6: Moderate disc degeneration and spondylosis. Moderate spinal stenosis and moderate foraminal stenosis bilaterally due to spurring C6-7: Disc degeneration and spondylosis. Diffuse uncinate spurring and bilateral facet degeneration. Mild spinal stenosis. Moderate foraminal stenosis bilaterally C7-T1: Moderate right foraminal encroachment due to facet hypertrophy. Mild left foraminal encroachment due to facet hypertrophy. IMPRESSION: 5 mm retrolisthesis at C3-4 with advanced disc degeneration and spurring. Cord flattening with moderate spinal stenosis and mild cord hyperintensity. Moderate foraminal encroachment bilaterally due to spurring Solid fusion C4-5 Moderate spinal stenosis and moderate foraminal encroachment bilaterally at C5-6. Mild spinal stenosis at C6-7 with moderate foraminal encroachment bilaterally Foraminal encroachment right greater than left at C7-T1 due to facet hypertrophy. Electronically Signed   By: Franchot Gallo M.D.   On: 08/08/2018 09:43

## 2018-08-31 ENCOUNTER — Ambulatory Visit: Payer: Medicare HMO

## 2018-08-31 ENCOUNTER — Ambulatory Visit
Admission: RE | Admit: 2018-08-31 | Discharge: 2018-08-31 | Disposition: A | Discharge status: Home / Self Care | Payer: Medicare HMO | Source: Ambulatory Visit | Attending: Radiation Oncology | Admitting: Radiation Oncology

## 2018-08-31 DIAGNOSIS — C770 Secondary and unspecified malignant neoplasm of lymph nodes of head, face and neck: Secondary | ICD-10-CM | POA: Diagnosis not present

## 2018-09-01 ENCOUNTER — Ambulatory Visit: Payer: Self-pay

## 2018-09-01 ENCOUNTER — Encounter: Payer: Self-pay | Admitting: *Deleted

## 2018-09-01 ENCOUNTER — Other Ambulatory Visit: Payer: Self-pay

## 2018-09-01 ENCOUNTER — Ambulatory Visit
Admission: RE | Admit: 2018-09-01 | Discharge: 2018-09-01 | Disposition: A | Discharge status: Home / Self Care | Payer: Medicare HMO | Source: Ambulatory Visit | Attending: Radiation Oncology | Admitting: Radiation Oncology

## 2018-09-01 ENCOUNTER — Ambulatory Visit: Payer: Medicare HMO

## 2018-09-01 ENCOUNTER — Other Ambulatory Visit: Payer: Self-pay | Admitting: Medical

## 2018-09-01 ENCOUNTER — Inpatient Hospital Stay (HOSPITAL_BASED_OUTPATIENT_CLINIC_OR_DEPARTMENT_OTHER): Payer: Medicare HMO | Admitting: Medical

## 2018-09-01 ENCOUNTER — Emergency Department (HOSPITAL_COMMUNITY)
Admission: EM | Admit: 2018-09-01 | Discharge: 2018-09-01 | Disposition: A | Discharge status: Home / Self Care | Payer: Medicare HMO | Source: Home / Self Care | Attending: Emergency Medicine | Admitting: Emergency Medicine

## 2018-09-01 ENCOUNTER — Encounter (HOSPITAL_COMMUNITY): Payer: Self-pay

## 2018-09-01 ENCOUNTER — Inpatient Hospital Stay: Payer: Medicare HMO

## 2018-09-01 DIAGNOSIS — C7989 Secondary malignant neoplasm of other specified sites: Secondary | ICD-10-CM

## 2018-09-01 DIAGNOSIS — C801 Malignant (primary) neoplasm, unspecified: Secondary | ICD-10-CM

## 2018-09-01 DIAGNOSIS — E119 Type 2 diabetes mellitus without complications: Secondary | ICD-10-CM

## 2018-09-01 DIAGNOSIS — R55 Syncope and collapse: Secondary | ICD-10-CM

## 2018-09-01 DIAGNOSIS — E860 Dehydration: Secondary | ICD-10-CM | POA: Insufficient documentation

## 2018-09-01 DIAGNOSIS — Z7984 Long term (current) use of oral hypoglycemic drugs: Secondary | ICD-10-CM

## 2018-09-01 DIAGNOSIS — Z96652 Presence of left artificial knee joint: Secondary | ICD-10-CM | POA: Insufficient documentation

## 2018-09-01 DIAGNOSIS — Z79899 Other long term (current) drug therapy: Secondary | ICD-10-CM | POA: Insufficient documentation

## 2018-09-01 DIAGNOSIS — C770 Secondary and unspecified malignant neoplasm of lymph nodes of head, face and neck: Secondary | ICD-10-CM

## 2018-09-01 DIAGNOSIS — R000 Tachycardia, unspecified: Secondary | ICD-10-CM

## 2018-09-01 MED ORDER — SODIUM CHLORIDE 0.9 % IV SOLN
Freq: Once | INTRAVENOUS | Status: DC
  Filled 2018-09-01: qty 250

## 2018-09-01 MED ORDER — SODIUM CHLORIDE 0.9 % IV BOLUS: 1000.0000 mL | Freq: Once | INTRAVENOUS | Status: DC

## 2018-09-01 NOTE — ED Triage Notes (Addendum)
Pt brought over from cancer center where he was supposed to have IV fluids and radiation. Pt had near syncopal episode with tachycardia of 115. No neuro deficits or chest pain and SHOB. Pt is A&O x4 now and states that he is feeling much better after receiving some fluids at the cancer center.  110/78 HR 98 Temp 97.6 RR 18 100% RA 22G RH

## 2018-09-01 NOTE — Progress Notes (Signed)
Oncology Nurse Navigator Documentation  Met with Mr. Uptain following his final RT to offer support and to celebrate end of radiation treatment.   Provided verbal/written post-RT guidance:  Importance of keeping all follow-up appts, especially those with Nutrition, SLP and IVF.  Importance of protecting treatment area from sun.  Continuation of Sonafine application 2-3 times daily until supply exhausted after which transition to OTC lotion with vitamin E. Explained my role as navigator will continue for several more months and that I will be calling and/or joining him during follow-up visits.   I encouraged him to call me with needs/concerns.    Gayleen Orem, RN, BSN Head & Neck Oncology [place] [facility] [place] [phone]

## 2018-09-01 NOTE — Progress Notes (Signed)
Pt presented for IVF today before radiation treatment.  However during IV stick pt's head began to loll back and forth and pt reported feeling hot and tired.  Pt reported almost falling twice after arriving today and feeling dizzy with dry mouth and mild disorientation.  Reports double vision.  Intermittent trouble speaking but voice clear.  Pt experienced syncopal episode in infusion chair but regained awareness with ammonia/physical stimulation.  22G IV placed in [facility] after 2 attempts in [facility] and [facility] by RN Learta Codding and RN Lanelle Bal.  IV NS hung at 954ml/hr per VO from PA [place] who was present.  HR tachycardic initially at 115 with SBP in the 80s that stabilized after pt was given IVF and placed in modified trendelenberg position in chair, HR then in 90s with SBP staying above 110.  Afebrile.  Skin clammy.  Pt A&Ox4.  Denies CP or SOB.  No unilat weakness or facial droop or slurred speech noted, pupils equal round and reactive.  Pt taken in [facility] with belongings to [facility] room 19.  Report given to Charge RN Manuela Schwartz and [employer].

## 2018-09-01 NOTE — Progress Notes (Signed)
Patient to infusion for IVF. Upon sitting down patient stated he has been "feeling weak" and was so dizzy he "walked into the front door when arriving at the [facility]." BP 102/90 and heart rate 117. Sandi Mealy, PA over to assess patient. After 3 IV attempts, IV started in right hand #22 gauge. IVF infusing at 999 ml/hr. Sandi Mealy, PA to place orders for patient. Will continue to monitor.  1500- Patient transported to ED via wheelchair by Leland Johns and Sandi Mealy, PA.

## 2018-09-01 NOTE — ED Provider Notes (Signed)
[facility] DEPT Provider Note   CSN: [id number] Arrival date & time: 09/01/18  1504     History   Chief Complaint Chief Complaint  Patient presents with  . Dizziness  . Near Syncope  . Cancer Patient    HPI LEIB ELAHI is a 68 y.o. male.  HPI Patient is a 68 year old male who is being treated for head neck cancer and is currently undergoing radiation.  He is scheduled for his last radiation appointment today.  He stood up after getting to the hospital and felt lightheaded.  No syncope.  No melena or hematochezia.  He reports he received IV fluids on Monday.  He has had poor oral intake secondary to his radiation and neck pain.  No worsening of his neck pain.  He was given IV fluids prior to my evaluation he states he feels much better at this time.  He would like to be discharged from the emergency department.  His appointment is in the next 10 minutes and he is really interested in making his last radiation appointment.  He does not want blood work completed at this time.  He is received 1 L of fluid is able to stand at the bedside and feels much better.  He would like to go to his radiation treatment appointment.  He states that he feels lightheaded or weak at his radiation appointment he will come back to the emergency department and check himself back in.  No fevers or chills.  No chest or abdominal pain.  Denies nausea vomiting diarrhea.  No headache.  No syncope.  No preceding palpitations.   Past Medical History:  Diagnosis Date  . Alcohol abuse    until age 62  . Arthritis   . Bell's palsy 05/2017  . BPH (benign prostatic hyperplasia)   . Chronic hepatitis C without hepatic coma ([place]) FOLLOWED BY INFECTOUS DISEASE DR COMER   POSITIVE ANTIBODY TEST THIS YEAR 2016--  CURRENT TX ON HARVONI PO- completeed 2016  . Chronic low back pain   . Collapsed lung    right .[place] Kitchen..15 yrs ago, fell and hit some bricks  . DDD (degenerative disc disease),  lumbosacral   . Depression    no meds now- med used for bladder  . 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 ED (erectile dysfunction)   . Foley catheter in place    removed, post lumbar surgery- cath left in throughh rehab- had 2 UTI's  . GERD (gastroesophageal reflux disease)    hx none in long time  . History of cocaine abuse ([place])    per pt quit and last used 06-03-2013  . Lymphoma ([place])   . Urinary retention     Patient Active Problem List   Diagnosis Date Noted  . 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 Rectal bleeding 05/30/2018  . Dark stools 05/30/2018  . NSAID long-term use 05/30/2018  . Secondary malignancy of lymph nodes of head, face and neck ([place]) 05/12/2018  . 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 Cervical stenosis of spinal canal 05/10/2018  . Neck mass 04/21/2018  . Liver fibrosis 04/05/2018  . Fusion of lumbar spine 02/07/2017  . Constipation due to opioid therapy 01/27/2017  . Lumbar stenosis with neurogenic claudication 01/24/2017  . Enlarged prostate with urinary obstruction 06/16/2015  . Lumbar degenerative disc disease 03/05/2015  . Chronic hepatitis C without hepatic  coma ([place]) 12/09/2014  . Chronic low back pain 01/17/2014  . Osteoarthritis of left knee 09/21/2013  . Alcohol dependence ([place]) 06/12/2013  . Cocaine dependence ([place]) 06/12/2013  . Substance induced mood disorder ([place]) 06/12/2013    Past Surgical History:  Procedure Laterality Date  . APPENDECTOMY  1980's  . BACK SURGERY     lower back x2  . [place]  . CLOSED REDUCTION AND INTRAMAXILLARY FIXATION BILATERAL MENTAL FX'S/  PLACEMENT MAXILLARY STENT/ APERTURE WIRE PLACEMENT  01-04-2000  . DIRECT LARYNGOSCOPY N/A 04/21/2018   Procedure: DIRECT LARYNGOSCOPY WITH BIOPSY OF TONGUE BASE AND NASOPHARYNX;  Surgeon: Jerrell Belfast, MD;  Location: [facility];  Service: ENT;   Laterality: N/A;  Direct laryngoscopy with biopsy of tongue base and nasopharynx  . JOINT REPLACEMENT    . POSTERIOR LUMBAR FUSION  03-05-2015   laminectomy /  nerve decompression--  L4 -- S1  . RADICAL NECK DISSECTION Right 05/31/2018   Procedure: RADICAL MODIFIED RADICAL NECK DISSECTION;  Surgeon: Jerrell Belfast, MD;  Location: [facility];  Service: ENT;  Laterality: Right;  . RADIOLOGY WITH ANESTHESIA N/A 08/08/2018   Procedure: MRI CERVICAL SPINE WITHOUT CONTRAST;  Surgeon: Radiologist, Medication, MD;  Location: [facility];  Service: Radiology;  Laterality: N/A;  . TONSILLECTOMY  04/21/2018   Procedure: RIGHT TONSILLECTOMY;  Surgeon: Jerrell Belfast, MD;  Location: [facility];  Service: ENT;;  . TONSILLECTOMY    . TOTAL KNEE ARTHROPLASTY Left 09/21/2013   Procedure: LEFT TOTAL KNEE ARTHROPLASTY;  Surgeon: Yvette Rack., MD;  Location: [facility];  Service: Orthopedics;  Laterality: Left;  . TRANSURETHRAL RESECTION OF PROSTATE N/A 06/16/2015   Procedure: TRANSURETHRAL RESECTION OF THE PROSTATE WITH GYRUS INSTRUMENTS;  Surgeon: Franchot Gallo, MD;  [facility];  Service: Urology;  Laterality: N/A;        Home Medications    Prior to Admission medications   Medication Sig Start Date End Date Taking? Authorizing Provider  amoxicillin-clavulanate (AUGMENTIN ES-600) 600-42.9 MG/5ML suspension Take 5 mLs (600 mg total) by mouth 2 (two) times daily. 08/25/18   Tanner, Lyndon Code., PA-C  atorvastatin (LIPITOR) 20 MG tablet Take 20 mg by mouth daily at 6 PM.     [provider]  celecoxib (CELEBREX) 200 MG capsule TK 1 C PO Q 12 H PRN 07/12/18   [provider]  fentaNYL ([place]) 25 MCG/HR  08/21/18   [provider]  fluconazole (DIFLUCAN) 100 MG tablet HOLD ATORVASTATIN while on this medication. Take 2 tablets today, then 1 tablet daily x 20 more days. Patient not taking: Reported on 08/25/2018 08/22/18   Eppie Gibson, MD  gabapentin (NEURONTIN) 300 MG  capsule Take 300 mg by mouth 3 (three) times daily.    [provider]  Ibuprofen 40 MG/ML SUSP Take 10 mLs (400 mg total) by mouth every 6 (six) hours as needed (for pain, take with food. Take instead of ibuprofen pills.). 07/28/18   Eppie Gibson, MD  lidocaine (LIDODERM) 5 % Place 1 patch onto the skin daily. Remove & Discard patch within 12 hours or as directed by MD Patient not taking: Reported on 08/25/2018 06/23/18   [place] Lanius A, PA-C  lidocaine (XYLOCAINE) 2 % solution Patient: Mix 1part 2% viscous lidocaine, 1part H20. Swish & swallow 6mL of diluted mixture, 5min before meals and at bedtime, up to QID 07/17/18   Eppie Gibson, MD  lidocaine (XYLOCAINE) 2 % solution Use as directed 5 mLs in the mouth or throat  every 4 (four) hours as needed for mouth pain. Swish and swallow 08/25/18   Tanner, Lyndon Code., PA-C  metFORMIN (GLUCOPHAGE) 1000 MG tablet Take 1,000 mg by mouth 2 (two) times daily with a meal.    [provider]  Multiple Vitamins-Minerals (MULTIVITAMIN WITH IRON-MINERALS) liquid Take 15 mLs by mouth daily. Drink milk before taking this to protect stomach. 08/28/18   Eppie Gibson, MD  Naproxen Sod-diphenhydrAMINE (ALEVE PM) 220-25 MG TABS Take 2 tablets by mouth as directed. Three times weekly for sleep    [provider]  ondansetron (ZOFRAN ODT) 4 MG disintegrating tablet Take 1 tablet (4 mg total) by mouth every 8 (eight) hours as needed for nausea or vomiting. Place under tongue 08/25/18   Tanner, Lyndon Code., PA-C  oxyCODONE (ROXICODONE) 5 MG/5ML solution Take 5 mLs (5 mg total) by mouth every 6 (six) hours as needed for severe pain. 08/25/18   Tanner, Lyndon Code., PA-C  polyethylene glycol-electrolytes (NULYTELY/GOLYTELY) 420 g solution Take 4,000 mLs by mouth as directed. 05/29/18   Mansouraty, Telford Nab., MD  promethazine (PHENERGAN) 25 MG tablet Take 1 tablet (25 mg total) by mouth every 6 (six) hours as needed for nausea or vomiting. 07/28/18   Eppie Gibson,  MD  sodium fluoride (PREVIDENT 5000 PLUS) 1.1 % CREA dental cream Apply cream to tooth brush. Brush teeth for 2 minutes. Spit out excess-DO NOT swallow. Repeat nightly. Patient not taking: Reported on 08/25/2018 05/11/18   Lenn Cal, DDS  sucralfate (CARAFATE) 1 g tablet Dissolve 1 tablet in 10 mL H20 and swallow 30 min prior to meals and bedtime. 07/28/18   Eppie Gibson, MD  sucralfate (CARAFATE) 1 g tablet Take 1 tablet (1 g total) by mouth 4 (four) times daily -  with meals and at bedtime. Dissolve in 2 ounces of water then swallow 08/25/18   Tanner, Lyndon Code., PA-C  tamsulosin (FLOMAX) 0.4 MG CAPS capsule Take 1 capsule (0.4 mg total) by mouth daily. 02/01/17   Duffy Bruce, MD    Family History Family History  Problem Relation Age of Onset  . Liver cancer Paternal Grandmother   . Bone cancer Paternal Grandfather   . Stomach cancer Paternal Uncle   . Colon cancer Neg Hx   . Esophageal cancer Neg Hx   . Pancreatic cancer Neg Hx   . Inflammatory bowel disease Neg Hx   . Rectal cancer Neg Hx     Social History Social History   Tobacco Use  . Smoking status: Never Smoker  . Smokeless tobacco: Never Used  Substance Use Topics  . Alcohol use: Not Currently    Comment: occ, heavy drinker prior to age 20, completely stopped 14 months ago; none as off 05/29/18  . Drug use: Not Currently    Types: "Crack" cocaine    Comment: per pt quit and last used crack 04/16/17     Allergies   Patient has no known allergies.   Review of Systems Review of Systems  All other systems reviewed and are negative.    Physical Exam Updated Vital Signs BP (!) 138/92   Pulse 94   Temp 97.9 F (36.6 C) (Oral)   Resp 13   Ht 6\' 1"  (1.854 m)   Wt 79 kg   SpO2 100%   BMI 22.98 kg/m   Physical Exam Vitals signs and nursing note reviewed.  Constitutional:      Appearance: He is well-developed.  HENT:     Head: Normocephalic and atraumatic.  Neck:     Musculoskeletal: Normal range  of motion.  Cardiovascular:     Rate and Rhythm: Normal rate and regular rhythm.     Heart sounds: Normal heart sounds.  Pulmonary:     Effort: Pulmonary effort is normal. No respiratory distress.     Breath sounds: Normal breath sounds.  Abdominal:     General: There is no distension.     Palpations: Abdomen is soft.     Tenderness: There is no abdominal tenderness.  Musculoskeletal: Normal range of motion.  Skin:    General: Skin is warm and dry.  Neurological:     Mental Status: He is alert and oriented to person, place, and time.  Psychiatric:        Judgment: Judgment normal.      ED Treatments / Results  Labs (all labs ordered are listed, but only abnormal results are displayed) Labs Reviewed - No data to display  EKG None  Radiology No results found.  Procedures Procedures (including critical care time)  Medications Ordered in ED Medications - No data to display   Initial Impression / Assessment and Plan / ED Course  I have reviewed the triage vital signs and the nursing notes.  Pertinent labs & imaging results that were available during my care of the patient were reviewed by me and considered in my medical decision making (see chart for details).     Patient is able to stand at the bedside after 1 L of fluid.  He states he is not lightheaded.  He feels much better at this time.  He would benefit from a second liter of fluid and laboratory studies but he would prefer to go to his radiation appointment at this time.  This is not unreasonable.  He is able to make decisions for himself.  His heart rate is improved.  He has no hypotension standing up or symptoms standing up any further.  I suspect acute dehydration.  He understands return to the ER as needed for new or worsening symptoms.  Final Clinical Impressions(s) / ED Diagnoses   Final diagnoses:  Acute dehydration    ED Discharge Orders    None       Jola Schmidt, MD 09/01/18 [phone]

## 2018-09-01 NOTE — Patient Instructions (Signed)
Dehydration, Adult  Dehydration is when there is not enough fluid or water in your body. This happens when you lose more fluids than you take in. Dehydration can range from mild to very bad. It should be treated right away to keep it from getting very bad. Symptoms of mild dehydration may include:  Thirst.  Dry lips.  Slightly dry mouth.  Dry, warm skin.  Dizziness. Symptoms of moderate dehydration may include:  Very dry mouth.  Muscle cramps.  Dark pee (urine). Pee may be the color of tea.  Your body making less pee.  Your eyes making fewer tears.  Heartbeat that is uneven or faster than normal (palpitations).  Headache.  Light-headedness, especially when you stand up from sitting.  Fainting (syncope). Symptoms of very bad dehydration may include:  Changes in skin, such as: ? Cold and clammy skin. ? Blotchy (mottled) or pale skin. ? Skin that does not quickly return to normal after being lightly pinched and let go (poor skin turgor).  Changes in body fluids, such as: ? Feeling very thirsty. ? Your eyes making fewer tears. ? Not sweating when body temperature is high, such as in hot weather. ? Your body making very little pee.  Changes in vital signs, such as: ? Weak pulse. ? Pulse that is more than 100 beats a minute when you are sitting still. ? Fast breathing. ? Low blood pressure.  Other changes, such as: ? Sunken eyes. ? Cold hands and feet. ? Confusion. ? Lack of energy (lethargy). ? Trouble waking up from sleep. ? Short-term weight loss. ? Unconsciousness. Follow these instructions at home:   If told by your doctor, drink an ORS: ? Make an ORS by using instructions on the package. ? Start by drinking small amounts, about  cup (120 mL) every 5-10 minutes. ? Slowly drink more until you have had the amount that your doctor said to have.  Drink enough clear fluid to keep your pee clear or pale yellow. If you were told to drink an ORS, finish the  ORS first, then start slowly drinking clear fluids. Drink fluids such as: ? Water. Do not drink only water by itself. Doing that can make the salt (sodium) level in your body get too low (hyponatremia). ? Ice chips. ? Fruit juice that you have added water to (diluted). ? Low-calorie sports drinks.  Avoid: ? Alcohol. ? Drinks that have a lot of sugar. These include high-calorie sports drinks, fruit juice that does not have water added, and soda. ? Caffeine. ? Foods that are greasy or have a lot of fat or sugar.  Take over-the-counter and prescription medicines only as told by your doctor.  Do not take salt tablets. Doing that can make the salt level in your body get too high (hypernatremia).  Eat foods that have minerals (electrolytes). Examples include bananas, oranges, potatoes, tomatoes, and spinach.  Keep all follow-up visits as told by your doctor. This is important. Contact a doctor if:  You have belly (abdominal) pain that: ? Gets worse. ? Stays in one area (localizes).  You have a rash.  You have a stiff neck.  You get angry or annoyed more easily than normal (irritability).  You are more sleepy than normal.  You have a harder time waking up than normal.  You feel: ? Weak. ? Dizzy. ? Very thirsty.  You have peed (urinated) only a small amount of very dark pee during 6-8 hours. Get help right away if:  You have   symptoms of very bad dehydration.  You cannot drink fluids without throwing up (vomiting).  Your symptoms get worse with treatment.  You have a fever.  You have a very bad headache.  You are throwing up or having watery poop (diarrhea) and it: ? Gets worse. ? Does not go away.  You have blood or something green (bile) in your throw-up.  You have blood in your poop (stool). This may cause poop to look black and tarry.  You have not peed in 6-8 hours.  You pass out (faint).  Your heart rate when you are sitting still is more than 100 beats a  minute.  You have trouble breathing. This information is not intended to replace advice given to you by your health care provider. Make sure you discuss any questions you have with your health care provider. Document Released: 05/15/2009 Document Revised: 02/06/2016 Document Reviewed: 09/12/2015 Elsevier Interactive Patient Education  2019 Elsevier Inc.  

## 2018-09-01 NOTE — Patient Instructions (Signed)
Syncope Syncope is when you pass out (faint) for a short time. It is caused by a sudden decrease in blood flow to the brain. Signs that you may be about to pass out include:  Feeling dizzy or light-headed.  Feeling sick to your stomach (nauseous).  Seeing all white or all black.  Having cold, clammy skin. If you pass out, get help right away. Call your local emergency services (911 in the [place] Do not drive yourself to the hospital. Follow these instructions at home: Watch for any changes in your symptoms. Take these actions to stay safe and help with your symptoms: Lifestyle  Do not drive, use machinery, or play sports until your doctor says it is okay.  Do not drink alcohol.  Do not use any products that contain nicotine or tobacco, such as cigarettes and e-cigarettes. If you need help quitting, ask your doctor.  Drink enough fluid to keep your pee (urine) pale yellow. General instructions  Take over-the-counter and prescription medicines only as told by your doctor.  If you are taking blood pressure or heart medicine, sit up and stand up slowly. Spend a few minutes getting ready to sit and then stand. This can help you feel less dizzy.  Have someone stay with you until you feel stable.  If you start to feel like you might pass out, lie down right away and raise (elevate) your feet above the level of your heart. Breathe deeply and steadily. Wait until all of the symptoms are gone.  Keep all follow-up visits as told by your doctor. This is important. Get help right away if:  You have a very bad headache.  You pass out once or more than once.  You have pain in your chest, belly, or back.  You have a very fast or uneven heartbeat (palpitations).  It hurts to breathe.  You are bleeding from your mouth or your bottom (rectum).  You have black or tarry poop (stool).  You have jerky movements that you cannot control (seizure).  You are confused.  You have trouble  walking.  You are very weak.  You have vision problems. These symptoms may be an emergency. Do not wait to see if the symptoms will go away. Get medical help right away. Call your local emergency services (911 in the [place] Do not drive yourself to the hospital. Summary  Syncope is when you pass out (faint) for a short time. It is caused by a sudden decrease in blood flow to the brain.  Signs that you may be about to faint include feeling dizzy, light-headed, or sick to your stomach, seeing all white or all black, or having cold, clammy skin.  If you start to feel like you might pass out, lie down right away and raise (elevate) your feet above the level of your heart. Breathe deeply and steadily. Wait until all of the symptoms are gone. This information is not intended to replace advice given to you by your health care provider. Make sure you discuss any questions you have with your health care provider. Document Released: 01/05/2008 Document Revised: 08/31/2017 Document Reviewed: 08/31/2017 Elsevier Interactive Patient Education  2019 Elsevier Inc.  

## 2018-09-01 NOTE — ED Notes (Signed)
Bed: BH41 Expected date:  Expected time:  Means of arrival:  Comments: [facility] pt

## 2018-09-04 ENCOUNTER — Inpatient Hospital Stay: Payer: Medicare HMO | Attending: Hematology

## 2018-09-04 ENCOUNTER — Ambulatory Visit: Payer: Medicare HMO

## 2018-09-04 ENCOUNTER — Inpatient Hospital Stay: Payer: Medicare HMO | Admitting: Nutrition

## 2018-09-04 DIAGNOSIS — M542 Cervicalgia: Secondary | ICD-10-CM | POA: Insufficient documentation

## 2018-09-04 DIAGNOSIS — M25569 Pain in unspecified knee: Secondary | ICD-10-CM | POA: Insufficient documentation

## 2018-09-04 DIAGNOSIS — E119 Type 2 diabetes mellitus without complications: Secondary | ICD-10-CM | POA: Insufficient documentation

## 2018-09-04 DIAGNOSIS — E860 Dehydration: Secondary | ICD-10-CM | POA: Insufficient documentation

## 2018-09-04 DIAGNOSIS — Z923 Personal history of irradiation: Secondary | ICD-10-CM | POA: Insufficient documentation

## 2018-09-04 DIAGNOSIS — F331 Major depressive disorder, recurrent, moderate: Secondary | ICD-10-CM | POA: Insufficient documentation

## 2018-09-04 DIAGNOSIS — C760 Malignant neoplasm of head, face and neck: Secondary | ICD-10-CM | POA: Insufficient documentation

## 2018-09-04 DIAGNOSIS — C770 Secondary and unspecified malignant neoplasm of lymph nodes of head, face and neck: Secondary | ICD-10-CM | POA: Insufficient documentation

## 2018-09-05 ENCOUNTER — Other Ambulatory Visit: Payer: Self-pay

## 2018-09-05 ENCOUNTER — Telehealth: Payer: Self-pay | Admitting: *Deleted

## 2018-09-05 ENCOUNTER — Encounter: Payer: Self-pay | Admitting: Radiation Oncology

## 2018-09-05 DIAGNOSIS — C7989 Secondary malignant neoplasm of other specified sites: Secondary | ICD-10-CM

## 2018-09-05 NOTE — Telephone Encounter (Signed)
Oncology Nurse Navigator Documentation  Attempted x2 to call Mr. Mcgaugh to check on his well-being s/p missed IVF yesterday and to inform of tomorrow's 2:30 lab and 3:00 IVF.  Unable to LVMM "message box full".  LVMM for his dtr with this information, requested call-back to confirm message receipt.  Gayleen Orem, RN, BSN Head & Neck Oncology Nurse [place] at [place] [phone]

## 2018-09-05 NOTE — Progress Notes (Signed)
  Radiation Oncology         [phone]) [phone] ________________________________  Name: [name] MRN: [id number]  Date: 09/05/2018  DOB: 1950-10-24  End of Treatment Note  Diagnosis:   Metastatic Head and NeckSquamous cell carcinomaof Unknown Primary     Indication for treatment:  Curative       Radiation treatment dates:   07/12/2019 - 09/01/2018  Site/dose:   Oropharynx and bilateral neck / treated to total dose of 60 Gy in 30 fractions of 2 Gy  Beams/energy:   IMRT, photons / 6X  Narrative: The patient tolerated radiation treatment relatively well. He reported throat pain, nausea ad vomiting, diminished taste, excessive mucus, and thick saliva throughout treatment. He experienced thrush and skin hyperpigmentation. He became increasingly unable to swallow, reporting he would vomit the Ensure he drank when it was about halfway down his throat. He was given IVF with improvement in his overall wellbeing. Followed by our dietician and SLP.  Plan: The patient has completed radiation treatment. The patient will return to radiation oncology clinic for routine followup in one half month. I advised them to call or return sooner if they have any questions or concerns related to their recovery or treatment.  -----------------------------------  Eppie Gibson, MD  This document serves as a record of services personally performed by Eppie Gibson, MD. It was created on her behalf by Wilburn Mylar, a trained medical scribe. The creation of this record is based on the scribe's personal observations and the provider's statements to them. This document has been checked and approved by the attending provider.

## 2018-09-06 ENCOUNTER — Telehealth: Payer: Self-pay | Admitting: *Deleted

## 2018-09-06 ENCOUNTER — Inpatient Hospital Stay: Payer: Medicare HMO

## 2018-09-06 ENCOUNTER — Ambulatory Visit: Payer: Medicare HMO

## 2018-09-06 DIAGNOSIS — C770 Secondary and unspecified malignant neoplasm of lymph nodes of head, face and neck: Secondary | ICD-10-CM | POA: Insufficient documentation

## 2018-09-06 NOTE — Telephone Encounter (Signed)
Oncology Nurse Navigator Documentation  Called Mr. Turrell to check on well-being, confirm understanding re today's appts.  No answer, unable to LVMM.  LVMM on dtr's phone explaining above, asked for call-back.  Gayleen Orem, RN, BSN Head & Neck Oncology Nurse [place] at [place] [phone]

## 2018-09-06 NOTE — Progress Notes (Signed)
[facility] Progress Note   [name] [id number] 03/04/1951 68 y.o.  [name] is managed by Dr. 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  5 IV attempts were made until the patient could have an IV established.  He is taken to the emergency room for evaluation and management.  Metastatic squamous cell carcinoma of the head neck: The patient continues to be managed by Dr. Eppie Gibson and is receiving radiation therapy.  Please see After Visit Summary for patient specific instructions.  Future Appointments  Date Time Provider [place]  09/08/2018  2:15 PM [facility] INFUSION [facility] None  09/11/2018  2:30 PM [facility] INFUSION [facility] None  09/12/2018  2:20 PM Eppie Gibson, MD [facility] None  09/15/2018  3:30 PM [facility] INFUSION [facility] None  09/18/2018  2:30 PM [facility] INFUSION [facility] None  09/22/2018  3:30 PM [facility] INFUSION [facility] None    No orders of the defined types were placed in this encounter.      Subjective:   Patient ID:  [name] is a 68 y.o. (DOB [birth year]) male.  Chief Complaint: No chief complaint on file.   HPI [name] is a 68 year old male with a history of a metastatic squamous cell carcinoma of the head and neck who is receiving radiation therapy under the care of Dr. Eppie Gibson.  The patient was sent to the infusion room to receive IV fluids today when he had a presyncopal episode.  When he was brought back to the infusion room and placed in a recliner he was found to be profoundly dehydrated and had a syncopal episode.  5 attempts were made until an IV could be placed and the patient begun on IV fluids.  Due  to the severity of his dehydration he was taken to the emergency room for evaluation and management.  Medications: I have reviewed the patient's current medications.  Allergies: No Known Allergies  Past Medical History:  Diagnosis Date  . Alcohol abuse    until age 63  . Arthritis   . Bell's palsy 05/2017  . BPH (benign prostatic hyperplasia)   . Chronic hepatitis C without hepatic coma ([place]) FOLLOWED BY INFECTOUS DISEASE DR COMER   POSITIVE ANTIBODY TEST THIS YEAR 2016--  CURRENT TX ON HARVONI PO- completeed 2016  . Chronic low back pain   . Collapsed lung    right .[place] Kitchen..15 yrs ago, fell and hit some bricks  . DDD (degenerative disc disease), lumbosacral   . Depression    no meds now- med used for bladder  . 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 ED (erectile dysfunction)   . Foley catheter in place    removed, post lumbar surgery- cath left in throughh rehab- had 2 UTI's  . GERD (gastroesophageal reflux disease)    hx none in long time  . History of cocaine abuse ([place])    per pt quit and last used 06-03-2013  . Lymphoma ([place])   . Urinary retention     Past Surgical History:  Procedure Laterality Date  . APPENDECTOMY  1980's  . BACK SURGERY     lower back x2  . [place]  . CLOSED REDUCTION AND INTRAMAXILLARY FIXATION BILATERAL MENTAL  FX'S/  PLACEMENT MAXILLARY STENT/ APERTURE WIRE PLACEMENT  01-04-2000  . DIRECT LARYNGOSCOPY N/A 04/21/2018   Procedure: DIRECT LARYNGOSCOPY WITH BIOPSY OF TONGUE BASE AND NASOPHARYNX;  Surgeon: Jerrell Belfast, MD;  Location: [place];  Service: ENT;  Laterality: N/A;  Direct laryngoscopy with biopsy of tongue base and nasopharynx  . JOINT REPLACEMENT    . POSTERIOR LUMBAR FUSION  03-05-2015   laminectomy /  nerve decompression--  L4 -- S1  . RADICAL NECK DISSECTION Right 05/31/2018   Procedure: RADICAL MODIFIED RADICAL NECK DISSECTION;  Surgeon: Jerrell Belfast, MD;  Location: [place];  Service: ENT;   Laterality: Right;  . RADIOLOGY WITH ANESTHESIA N/A 08/08/2018   Procedure: MRI CERVICAL SPINE WITHOUT CONTRAST;  Surgeon: Radiologist, Medication, MD;  Location: [place];  Service: Radiology;  Laterality: N/A;  . TONSILLECTOMY  04/21/2018   Procedure: RIGHT TONSILLECTOMY;  Surgeon: Jerrell Belfast, MD;  Location: [place];  Service: ENT;;  . TONSILLECTOMY    . TOTAL KNEE ARTHROPLASTY Left 09/21/2013   Procedure: LEFT TOTAL KNEE ARTHROPLASTY;  Surgeon: Yvette Rack., MD;  Location: [place];  Service: Orthopedics;  Laterality: Left;  . TRANSURETHRAL RESECTION OF PROSTATE N/A 06/16/2015   Procedure: TRANSURETHRAL RESECTION OF THE PROSTATE WITH GYRUS INSTRUMENTS;  Surgeon: Franchot Gallo, MD;  Location[facility];  Service: Urology;  Laterality: N/A;    Family History  Problem Relation Age of Onset  . Liver cancer Paternal Grandmother   . Bone cancer Paternal Grandfather   . Stomach cancer Paternal Uncle   . Colon cancer Neg Hx   . Esophageal cancer Neg Hx   . Pancreatic cancer Neg Hx   . Inflammatory bowel disease Neg Hx   . Rectal cancer Neg Hx     Social History   Socioeconomic History  . Marital status: Divorced    Spouse name: Not on file  . Number of children: Not on file  . Years of education: Not on file  . Highest education level: Not on file  Occupational History  . Occupation: [employer]  . Financial resource strain: Not on file  . Food insecurity:    Worry: Not on file    Inability: Not on file  . Transportation needs:    Medical: Yes    Non-medical: Yes  Tobacco Use  . Smoking status: Never Smoker  . Smokeless tobacco: Never Used  Substance and Sexual Activity  . Alcohol use: Not Currently    Comment: occ, heavy drinker prior to age 6, completely stopped 14 months ago; none as off 05/29/18  . Drug use: Not Currently    Types: "Crack" cocaine    Comment: per pt quit and last used crack 04/16/17  . Sexual activity: Not on file     Comment: declined condoms  Lifestyle  . Physical activity:    Days per week: Not on file    Minutes per session: Not on file  . Stress: Not on file  Relationships  . Social connections:    Talks on phone: Not on file    Gets together: Not on file    Attends religious service: Not on file    Active member of club or organization: Not on file    Attends meetings of clubs or organizations: Not on file    Relationship status: Not on file  . Intimate partner violence:    Fear of current or ex partner: No    Emotionally abused: No    Physically abused:  No    Forced sexual activity: No  Other Topics Concern  . Not on file  Social History Narrative  . Not on file    Past Medical History, Surgical history, Social history, and Family history were reviewed and updated as appropriate.   Please see review of systems for further details on the patient's review from today.   Review of Systems:  Review of Systems  Constitutional: Negative for appetite change, chills, diaphoresis and fever.  HENT: Negative for dental problem, mouth sores and trouble swallowing.   Respiratory: Negative for cough, chest tightness and shortness of breath.   Cardiovascular: Negative for chest pain and palpitations.  Gastrointestinal: Negative for constipation, diarrhea, nausea and vomiting.  Neurological: Positive for dizziness, syncope and weakness. Negative for headaches.    Objective:   Physical Exam:  There were no vitals taken for this visit. ECOG: 1  Physical Exam Constitutional:      General: He is not in acute distress.    Appearance: He is not diaphoretic.  HENT:     Head: Normocephalic and atraumatic.     Mouth/Throat:     Mouth: Mucous membranes are dry.  Cardiovascular:     Rate and Rhythm: Normal rate and regular rhythm.     Heart sounds: Normal heart sounds. No murmur. No friction rub. No gallop.   Pulmonary:     Effort: Pulmonary effort is normal. No respiratory distress.      Breath sounds: Normal breath sounds. No wheezing or rales.  Skin:    General: Skin is warm and dry.     Findings: No erythema or rash.  Neurological:     Mental Status: He is lethargic.     Motor: Weakness present.     Lab Review:     Component Value Date/Time   NA 140 08/25/2018 0939   K 4.3 08/25/2018 0939   CL 104 08/25/2018 0939   CO2 23 08/25/2018 0939   GLUCOSE 99 08/25/2018 0939   BUN 16 08/25/2018 0939   CREATININE 1.08 08/25/2018 0939   CREATININE 1.04 01/26/2016 1025   CALCIUM 10.3 08/25/2018 0939   PROT 7.8 08/25/2018 0939   ALBUMIN 4.0 08/25/2018 0939   AST 25 08/25/2018 0939   ALT 30 08/25/2018 0939   ALKPHOS 66 08/25/2018 0939   BILITOT 0.6 08/25/2018 0939   GFRNONAA >60 08/25/2018 0939   GFRNONAA 76 01/26/2016 1025   GFRAA >60 08/25/2018 0939   GFRAA 88 01/26/2016 1025       Component Value Date/Time   WBC 5.1 08/25/2018 0939   WBC 8.1 06/06/2018 1336   RBC 5.49 08/25/2018 0939   HGB 16.4 08/25/2018 0939   HCT 48.9 08/25/2018 0939   PLT 190 08/25/2018 0939   MCV 89.1 08/25/2018 0939   MCH 29.9 08/25/2018 0939   MCHC 33.5 08/25/2018 0939   RDW 13.1 08/25/2018 0939   LYMPHSABS 0.5 (L) 08/25/2018 0939   MONOABS 0.5 08/25/2018 0939   EOSABS 0.1 08/25/2018 0939   BASOSABS 0.0 08/25/2018 0939   -------------------------------  Imaging from last 24 hours (if applicable):  Radiology interpretation: Mr Cervical Spine Wo Contrast  Result Date: 08/08/2018 CLINICAL DATA:  Cervical spinal stenosis. History of head neck cancer. MRI performed with general anesthesia for claustrophobia. EXAM: MRI CERVICAL SPINE WITHOUT CONTRAST TECHNIQUE: Multiplanar, multisequence MR imaging of the cervical spine was performed. No intravenous contrast was administered. COMPARISON:  CT neck 02/21/2018 FINDINGS: Alignment: 5 mm retrolisthesis C3-4 unchanged from the prior CT. Slight retrolisthesis C5-6 and  C6-7. Vertebrae: ACDF with solid fusion C4-5. Negative for fracture or  mass. Cord: Mild cord hyperintensity at C3-4 related to cord compression from stenosis. No other cord signal abnormality. Posterior Fossa, vertebral arteries, paraspinal tissues: The patient is intubated. No paraspinous mass or fluid collection. Disc levels: C2-3: Mild disc bulging without stenosis. C3-4: 5 mm retrolisthesis with severe disc degeneration and spurring. Large posterior osteophyte is present with cord flattening and moderate spinal stenosis. Mild cord hyperintensity at this level. Moderate foraminal stenosis bilaterally. C4-5: ACDF with anterior plate and screws.  Negative for stenosis. C5-6: Moderate disc degeneration and spondylosis. Moderate spinal stenosis and moderate foraminal stenosis bilaterally due to spurring C6-7: Disc degeneration and spondylosis. Diffuse uncinate spurring and bilateral facet degeneration. Mild spinal stenosis. Moderate foraminal stenosis bilaterally C7-T1: Moderate right foraminal encroachment due to facet hypertrophy. Mild left foraminal encroachment due to facet hypertrophy. IMPRESSION: 5 mm retrolisthesis at C3-4 with advanced disc degeneration and spurring. Cord flattening with moderate spinal stenosis and mild cord hyperintensity. Moderate foraminal encroachment bilaterally due to spurring Solid fusion C4-5 Moderate spinal stenosis and moderate foraminal encroachment bilaterally at C5-6. Mild spinal stenosis at C6-7 with moderate foraminal encroachment bilaterally Foraminal encroachment right greater than left at C7-T1 due to facet hypertrophy. Electronically Signed   By: Franchot Gallo M.D.   On: 08/08/2018 09:43

## 2018-09-07 ENCOUNTER — Telehealth: Payer: Self-pay | Admitting: *Deleted

## 2018-09-07 ENCOUNTER — Encounter: Payer: Self-pay | Admitting: Radiation Oncology

## 2018-09-07 NOTE — Telephone Encounter (Signed)
Oncology Nurse Navigator Documentation  Called [name], no answer, unable to LVMM (mailbox full).  Called dtr, no answer, LVMM requested return call.  Called uncle who stated he has not been in recent contact with [name].  I explained our concern for his nephew's well-being in context of missed appts this week, inability to reach him by phone.  I noted I will place a call to [phone] for a Wellness Check if contact with [name] cannot be established.  He indicated he will try to contact [name], also reach out to his dtr.  He agreed to call me with update.  Gayleen Orem, RN, BSN Head & Neck Oncology Nurse [place] at [place] [phone]

## 2018-09-07 NOTE — Progress Notes (Signed)
[name] presents for follow up of radiation completed 09/01/18 to his Oropharynx.   Pain issues, if any: He reports pain to his incision site behind his right ear. He tells me the area "feels tight". He is not taking pain medicine.  Using a feeding tube?: N/A Weight changes, if any:  Wt Readings from Last 3 Encounters:  09/12/18 174 lb (78.9 kg)  09/01/18 174 lb 2.6 oz (79 kg)  08/29/18 175 lb 8 oz (79.6 kg)   Swallowing issues, if any: He has had difficulty with swallowing due to the tightness to his right neck area. He tells me that he was able to swallow milk, water, a peanut butter and jelly sandwich cut up into very small pieces yesterday. He tells me that he is feeling better.  Smoking or chewing tobacco? No Using fluoride trays daily? No Last ENT visit was on: Not since diagnosis Other notable issues, if any:  He is scheduled to see Sandi Mealy PA while receiving IVF today.  He reports continued thick saliva. He has a home suction machine that he is using.  The skin to his radiation has healed well. He continues to use sonafine daily.   BP 138/70 (BP Location: Right Arm)   Pulse 94   Temp 98.2 F (36.8 C) (Oral)   Wt 174 lb (78.9 kg)   SpO2 99% Comment: room air  BMI 22.96 kg/m

## 2018-09-07 NOTE — Telephone Encounter (Signed)
Oncology Nurse Navigator Documentation  Rec'd call from Mr. Sipp s/p contact from uncle.  He reported he has been feeling very weak, staying in bed most of the day.  He apologized for missing Monday's IVF appt, indicated he was unaware of yesterday's lab and IVF.  I explained these were new appts but we were unable to reach him to inform b/c and unable to LVMM.  I encouraged him to clear phone mailbox so he can received messages re his care.  He voiced understanding, stated he will arrive tomorrow for scheduled IVF.  I informed him yesterday's lab will be rescheduled prior to 2:15 IVF.  Gayleen Orem, RN, BSN Head & Neck Oncology Nurse [place] at [place] [phone]

## 2018-09-07 NOTE — Telephone Encounter (Signed)
Oncology Nurse Navigator Documentation  Called patient to inform him of 1:00 pick-up tomorrow for 1:30 lab appt.  No answer, mailbox full.  Called patient uncle, provided information, he indicated he will relay.  Gayleen Orem, RN, BSN Head & Neck Oncology Nurse [place] at [place] [phone]

## 2018-09-08 ENCOUNTER — Telehealth: Payer: Self-pay | Admitting: *Deleted

## 2018-09-08 ENCOUNTER — Other Ambulatory Visit: Payer: Self-pay | Admitting: Medical

## 2018-09-08 ENCOUNTER — Inpatient Hospital Stay: Payer: Medicare HMO

## 2018-09-08 ENCOUNTER — Ambulatory Visit
Admission: RE | Admit: 2018-09-08 | Discharge: 2018-09-08 | Disposition: A | Discharge status: Home / Self Care | Payer: Medicare HMO | Source: Ambulatory Visit | Attending: Radiation Oncology | Admitting: Radiation Oncology

## 2018-09-08 ENCOUNTER — Inpatient Hospital Stay (HOSPITAL_BASED_OUTPATIENT_CLINIC_OR_DEPARTMENT_OTHER): Payer: Medicare HMO | Admitting: Medical

## 2018-09-08 DIAGNOSIS — C770 Secondary and unspecified malignant neoplasm of lymph nodes of head, face and neck: Secondary | ICD-10-CM | POA: Diagnosis not present

## 2018-09-08 DIAGNOSIS — C7989 Secondary malignant neoplasm of other specified sites: Secondary | ICD-10-CM

## 2018-09-08 DIAGNOSIS — C801 Malignant (primary) neoplasm, unspecified: Secondary | ICD-10-CM

## 2018-09-08 DIAGNOSIS — E860 Dehydration: Secondary | ICD-10-CM

## 2018-09-08 DIAGNOSIS — E119 Type 2 diabetes mellitus without complications: Secondary | ICD-10-CM | POA: Diagnosis not present

## 2018-09-08 DIAGNOSIS — M542 Cervicalgia: Secondary | ICD-10-CM | POA: Diagnosis not present

## 2018-09-08 DIAGNOSIS — M25569 Pain in unspecified knee: Secondary | ICD-10-CM | POA: Diagnosis not present

## 2018-09-08 DIAGNOSIS — F331 Major depressive disorder, recurrent, moderate: Secondary | ICD-10-CM | POA: Diagnosis not present

## 2018-09-08 DIAGNOSIS — Z923 Personal history of irradiation: Secondary | ICD-10-CM | POA: Diagnosis not present

## 2018-09-08 DIAGNOSIS — C760 Malignant neoplasm of head, face and neck: Secondary | ICD-10-CM | POA: Diagnosis present

## 2018-09-08 LAB — BASIC METABOLIC PANEL
Anion gap: 14 (ref 5–15)
BUN: 18 mg/dL (ref 8–23)
CO2: 25 mmol/L (ref 22–32)
Calcium: 10.6 mg/dL — ABNORMAL HIGH (ref 8.9–10.3)
Chloride: 103 mmol/L (ref 98–111)
Creatinine, Ser: 1.11 mg/dL (ref 0.61–1.24)
GFR calc Af Amer: >60 mL/min (ref >60)
GFR calc non Af Amer: >60 mL/min (ref >60)
Glucose, Bld: 96 mg/dL (ref 70–99)
POTASSIUM: 4.4 mmol/L (ref 3.5–5.1)
Sodium: 142 mmol/L (ref 135–145)

## 2018-09-08 MED ORDER — ONDANSETRON HCL 4 MG/2ML IJ SOLN
4.0000 mg | Freq: Once | INTRAMUSCULAR | Status: AC
  Administered 2018-09-08: 4 mg via INTRAVENOUS

## 2018-09-08 MED ORDER — SODIUM CHLORIDE 0.9 % IV SOLN
Freq: Once | INTRAVENOUS | Status: DC
  Filled 2018-09-08: qty 250

## 2018-09-08 MED ORDER — ONDANSETRON HCL 4 MG/2ML IJ SOLN
INTRAMUSCULAR | Status: AC
  Filled 2018-09-08: qty 2

## 2018-09-08 MED ORDER — SODIUM CHLORIDE 0.9 % IV SOLN
Freq: Once | INTRAVENOUS | Status: AC
  Administered 2018-09-08: 15:00:00 via INTRAVENOUS
  Filled 2018-09-08: qty 250

## 2018-09-08 MED ORDER — SODIUM CHLORIDE 0.9 % IV SOLN
INTRAVENOUS | Status: DC
  Filled 2018-09-08: qty 250

## 2018-09-08 MED ORDER — SODIUM CHLORIDE 0.9 % IV SOLN
Freq: Once | INTRAVENOUS | Status: AC
  Administered 2018-09-08: 14:00:00 via INTRAVENOUS
  Filled 2018-09-08: qty 250

## 2018-09-08 NOTE — Patient Instructions (Signed)
Dehydration, Adult  Dehydration is when there is not enough fluid or water in your body. This happens when you lose more fluids than you take in. Dehydration can range from mild to very bad. It should be treated right away to keep it from getting very bad. Symptoms of mild dehydration may include:  Thirst.  Dry lips.  Slightly dry mouth.  Dry, warm skin.  Dizziness. Symptoms of moderate dehydration may include:  Very dry mouth.  Muscle cramps.  Dark pee (urine). Pee may be the color of tea.  Your body making less pee.  Your eyes making fewer tears.  Heartbeat that is uneven or faster than normal (palpitations).  Headache.  Light-headedness, especially when you stand up from sitting.  Fainting (syncope). Symptoms of very bad dehydration may include:  Changes in skin, such as: ? Cold and clammy skin. ? Blotchy (mottled) or pale skin. ? Skin that does not quickly return to normal after being lightly pinched and let go (poor skin turgor).  Changes in body fluids, such as: ? Feeling very thirsty. ? Your eyes making fewer tears. ? Not sweating when body temperature is high, such as in hot weather. ? Your body making very little pee.  Changes in vital signs, such as: ? Weak pulse. ? Pulse that is more than 100 beats a minute when you are sitting still. ? Fast breathing. ? Low blood pressure.  Other changes, such as: ? Sunken eyes. ? Cold hands and feet. ? Confusion. ? Lack of energy (lethargy). ? Trouble waking up from sleep. ? Short-term weight loss. ? Unconsciousness. Follow these instructions at home:   If told by your doctor, drink an ORS: ? Make an ORS by using instructions on the package. ? Start by drinking small amounts, about  cup (120 mL) every 5-10 minutes. ? Slowly drink more until you have had the amount that your doctor said to have.  Drink enough clear fluid to keep your pee clear or pale yellow. If you were told to drink an ORS, finish the  ORS first, then start slowly drinking clear fluids. Drink fluids such as: ? Water. Do not drink only water by itself. Doing that can make the salt (sodium) level in your body get too low (hyponatremia). ? Ice chips. ? Fruit juice that you have added water to (diluted). ? Low-calorie sports drinks.  Avoid: ? Alcohol. ? Drinks that have a lot of sugar. These include high-calorie sports drinks, fruit juice that does not have water added, and soda. ? Caffeine. ? Foods that are greasy or have a lot of fat or sugar.  Take over-the-counter and prescription medicines only as told by your doctor.  Do not take salt tablets. Doing that can make the salt level in your body get too high (hypernatremia).  Eat foods that have minerals (electrolytes). Examples include bananas, oranges, potatoes, tomatoes, and spinach.  Keep all follow-up visits as told by your doctor. This is important. Contact a doctor if:  You have belly (abdominal) pain that: ? Gets worse. ? Stays in one area (localizes).  You have a rash.  You have a stiff neck.  You get angry or annoyed more easily than normal (irritability).  You are more sleepy than normal.  You have a harder time waking up than normal.  You feel: ? Weak. ? Dizzy. ? Very thirsty.  You have peed (urinated) only a small amount of very dark pee during 6-8 hours. Get help right away if:  You have   symptoms of very bad dehydration.  You cannot drink fluids without throwing up (vomiting).  Your symptoms get worse with treatment.  You have a fever.  You have a very bad headache.  You are throwing up or having watery poop (diarrhea) and it: ? Gets worse. ? Does not go away.  You have blood or something green (bile) in your throw-up.  You have blood in your poop (stool). This may cause poop to look black and tarry.  You have not peed in 6-8 hours.  You pass out (faint).  Your heart rate when you are sitting still is more than 100 beats a  minute.  You have trouble breathing. This information is not intended to replace advice given to you by your health care provider. Make sure you discuss any questions you have with your health care provider. Document Released: 05/15/2009 Document Revised: 02/06/2016 Document Reviewed: 09/12/2015 Elsevier Interactive Patient Education  2019 Elsevier Inc.  

## 2018-09-08 NOTE — Telephone Encounter (Signed)
Oncology Nurse Navigator Documentation  Called Mr. Budge to confirm message receipt from his uncle re 1:00 ride to [facility] for appts.  No answer, unable to LVMM.  Gayleen Orem, RN, BSN Head & Neck Oncology Nurse [place] at [place] [phone]

## 2018-09-08 NOTE — Progress Notes (Signed)
[name] was seen in the infusion room today as he was receiving IV fluids.  He continues to have significant dysphasia and reports that he has not had anything to eat and little to drink all week.  His orders for IV fluids were changed from 1 L over 2 hours to 2 L over 2 hours.  He was given Zofran 4 mg IV x1.  He will return on 09/11/2018 for an additional 2 liters of IV fluids.  He was initially offered to come tomorrow for IV fluids however he does not have a ride.  Sandi Mealy, MHS, PA-C Physician Assistant

## 2018-09-08 NOTE — Progress Notes (Signed)
Sandi Mealy PA to infusion room to assess pt r/t dehydration.

## 2018-09-11 ENCOUNTER — Other Ambulatory Visit: Payer: Self-pay | Admitting: Medical Oncology

## 2018-09-11 ENCOUNTER — Ambulatory Visit: Payer: Medicare HMO | Admitting: Nutrition

## 2018-09-11 ENCOUNTER — Other Ambulatory Visit: Payer: Self-pay

## 2018-09-11 ENCOUNTER — Inpatient Hospital Stay: Payer: Medicare HMO

## 2018-09-11 VITALS — BP 110/84 | HR 97 | Temp 98.2°F | Resp 18

## 2018-09-11 DIAGNOSIS — C7989 Secondary malignant neoplasm of other specified sites: Secondary | ICD-10-CM

## 2018-09-11 DIAGNOSIS — E860 Dehydration: Secondary | ICD-10-CM | POA: Diagnosis not present

## 2018-09-11 DIAGNOSIS — R110 Nausea: Secondary | ICD-10-CM

## 2018-09-11 MED ORDER — SODIUM CHLORIDE 0.9 % IV SOLN: Freq: Once | INTRAVENOUS | Status: DC

## 2018-09-11 MED ORDER — SODIUM CHLORIDE 0.9 % IV SOLN
1000.0000 mL | Freq: Once | INTRAVENOUS | Status: AC
  Administered 2018-09-11: 1000 mL via INTRAVENOUS
  Filled 2018-09-11: qty 1000

## 2018-09-11 MED ORDER — ONDANSETRON HCL 4 MG/2ML IJ SOLN
4.0000 mg | Freq: Once | INTRAMUSCULAR | Status: AC
  Administered 2018-09-11: 4 mg via INTRAVENOUS

## 2018-09-11 MED ORDER — ONDANSETRON HCL 4 MG/2ML IJ SOLN: 4.0000 mg | Freq: Once | INTRAMUSCULAR | Status: DC

## 2018-09-11 MED ORDER — SODIUM CHLORIDE 0.9 % IV SOLN
1000.0000 mL | Freq: Once | INTRAVENOUS | Status: DC
  Filled 2018-09-11: qty 1000

## 2018-09-11 MED ORDER — ONDANSETRON HCL 4 MG/2ML IJ SOLN
INTRAMUSCULAR | Status: AC
  Filled 2018-09-11: qty 2

## 2018-09-11 NOTE — Progress Notes (Signed)
Increased oral secretions. Using oral suction to remove thick oral secretions and nausea. Receiving IVF. Denies diarrhea.LBM yesterday.

## 2018-09-11 NOTE — Patient Instructions (Signed)
Dehydration, Adult  Dehydration is a condition in which there is not enough fluid or water in the body. This happens when you lose more fluids than you take in. Important organs, such as the kidneys, brain, and heart, cannot function without a proper amount of fluids. Any loss of fluids from the body can lead to dehydration. Dehydration can range from mild to severe. This condition should be treated right away to prevent it from becoming severe. What are the causes? This condition may be caused by:  Vomiting.  Diarrhea.  Excessive sweating, such as from heat exposure or exercise.  Not drinking enough fluid, especially: ? When ill. ? While doing activity that requires a lot of energy.  Excessive urination.  Fever.  Infection.  Certain medicines, such as medicines that cause the body to lose excess fluid (diuretics).  Inability to access safe drinking water.  Reduced physical ability to get adequate water and food. What increases the risk? This condition is more likely to develop in people:  Who have a poorly controlled long-term (chronic) illness, such as diabetes, heart disease, or kidney disease.  Who are age 65 or older.  Who are disabled.  Who live in a place with high altitude.  Who play endurance sports. What are the signs or symptoms? Symptoms of mild dehydration may include:  Thirst.  Dry lips.  Slightly dry mouth.  Dry, warm skin.  Dizziness. Symptoms of moderate dehydration may include:  Very dry mouth.  Muscle cramps.  Dark urine. Urine may be the color of tea.  Decreased urine production.  Decreased tear production.  Heartbeat that is irregular or faster than normal (palpitations).  Headache.  Light-headedness, especially when you stand up from a sitting position.  Fainting (syncope). Symptoms of severe dehydration may include:  Changes in skin, such as: ? Cold and clammy skin. ? Blotchy (mottled) or pale skin. ? Skin that does  not quickly return to normal after being lightly pinched and released (poor skin turgor).  Changes in body fluids, such as: ? Extreme thirst. ? No tear production. ? Inability to sweat when body temperature is high, such as in hot weather. ? Very little urine production.  Changes in vital signs, such as: ? Weak pulse. ? Pulse that is more than 100 beats a minute when sitting still. ? Rapid breathing. ? Low blood pressure.  Other changes, such as: ? Sunken eyes. ? Cold hands and feet. ? Confusion. ? Lack of energy (lethargy). ? Difficulty waking up from sleep. ? Short-term weight loss. ? Unconsciousness. How is this diagnosed? This condition is diagnosed based on your symptoms and a physical exam. Blood and urine tests may be done to help confirm the diagnosis. How is this treated? Treatment for this condition depends on the severity. Mild or moderate dehydration can often be treated at home. Treatment should be started right away. Do not wait until dehydration becomes severe. Severe dehydration is an emergency and it needs to be treated in a hospital. Treatment for mild dehydration may include:  Drinking more fluids.  Replacing salts and minerals in your blood (electrolytes) that you may have lost. Treatment for moderate dehydration may include:  Drinking an oral rehydration solution (ORS). This is a drink that helps you replace fluids and electrolytes (rehydrate). It can be found at pharmacies and retail stores. Treatment for severe dehydration may include:  Receiving fluids through an IV tube.  Receiving an electrolyte solution through a feeding tube that is passed through your nose and   into your stomach (nasogastric tube, or NG tube).  Correcting any abnormalities in electrolytes.  Treating the underlying cause of dehydration. Follow these instructions at home:  If directed by your health care provider, drink an ORS: ? Make an ORS by following instructions on the  package. ? Start by drinking small amounts, about  cup (120 mL) every 5-10 minutes. ? Slowly increase how much you drink until you have taken the amount recommended by your health care provider.  Drink enough clear fluid to keep your urine clear or pale yellow. If you were told to drink an ORS, finish the ORS first, then start slowly drinking other clear fluids. Drink fluids such as: ? Water. Do not drink only water. Doing that can lead to having too little salt (sodium) in the body (hyponatremia). ? Ice chips. ? Fruit juice that you have added water to (diluted fruit juice). ? Low-calorie sports drinks.  Avoid: ? Alcohol. ? Drinks that contain a lot of sugar. These include high-calorie sports drinks, fruit juice that is not diluted, and soda. ? Caffeine. ? Foods that are greasy or contain a lot of fat or sugar.  Take over-the-counter and prescription medicines only as told by your health care provider.  Do not take sodium tablets. This can lead to having too much sodium in the body (hypernatremia).  Eat foods that contain a healthy balance of electrolytes, such as bananas, oranges, potatoes, tomatoes, and spinach.  Keep all follow-up visits as told by your health care provider. This is important. Contact a health care provider if:  You have abdominal pain that: ? Gets worse. ? Stays in one area (localizes).  You have a rash.  You have a stiff neck.  You are more irritable than usual.  You are sleepier or more difficult to wake up than usual.  You feel weak or dizzy.  You feel very thirsty.  You have urinated only a small amount of very dark urine over 6-8 hours. Get help right away if:  You have symptoms of severe dehydration.  You cannot drink fluids without vomiting.  Your symptoms get worse with treatment.  You have a fever.  You have a severe headache.  You have vomiting or diarrhea that: ? Gets worse. ? Does not go away.  You have blood or green matter  (bile) in your vomit.  You have blood in your stool. This may cause stool to look black and tarry.  You have not urinated in 6-8 hours.  You faint.  Your heart rate while sitting still is over 100 beats a minute.  You have trouble breathing. This information is not intended to replace advice given to you by your health care provider. Make sure you discuss any questions you have with your health care provider. Document Released: 07/19/2005 Document Revised: 02/13/2016 Document Reviewed: 09/12/2015 Elsevier Interactive Patient Education  2019 [employer]  

## 2018-09-12 ENCOUNTER — Inpatient Hospital Stay: Payer: Medicare HMO

## 2018-09-12 ENCOUNTER — Other Ambulatory Visit: Payer: Self-pay

## 2018-09-12 ENCOUNTER — Encounter: Payer: Self-pay | Admitting: Radiation Oncology

## 2018-09-12 ENCOUNTER — Ambulatory Visit
Admission: RE | Admit: 2018-09-12 | Discharge: 2018-09-12 | Disposition: A | Discharge status: Home / Self Care | Payer: Medicare HMO | Source: Ambulatory Visit | Attending: Radiation Oncology | Admitting: Radiation Oncology

## 2018-09-12 ENCOUNTER — Other Ambulatory Visit: Payer: Self-pay | Admitting: Hematology

## 2018-09-12 ENCOUNTER — Inpatient Hospital Stay (HOSPITAL_BASED_OUTPATIENT_CLINIC_OR_DEPARTMENT_OTHER): Payer: Medicare HMO | Admitting: Medical

## 2018-09-12 ENCOUNTER — Other Ambulatory Visit: Payer: Self-pay | Admitting: Medical

## 2018-09-12 VITALS — BP 138/70 | HR 94 | Temp 98.2°F | Wt 174.0 lb

## 2018-09-12 DIAGNOSIS — C770 Secondary and unspecified malignant neoplasm of lymph nodes of head, face and neck: Secondary | ICD-10-CM | POA: Diagnosis present

## 2018-09-12 DIAGNOSIS — E860 Dehydration: Secondary | ICD-10-CM

## 2018-09-12 DIAGNOSIS — C7989 Secondary malignant neoplasm of other specified sites: Secondary | ICD-10-CM

## 2018-09-12 DIAGNOSIS — R110 Nausea: Secondary | ICD-10-CM

## 2018-09-12 DIAGNOSIS — Z79899 Other long term (current) drug therapy: Secondary | ICD-10-CM | POA: Diagnosis not present

## 2018-09-12 DIAGNOSIS — Z7984 Long term (current) use of oral hypoglycemic drugs: Secondary | ICD-10-CM | POA: Diagnosis not present

## 2018-09-12 HISTORY — DX: Personal history of irradiation: Z92.3

## 2018-09-12 MED ORDER — SODIUM CHLORIDE 0.9 % IV SOLN
Freq: Once | INTRAVENOUS | Status: DC
  Filled 2018-09-12: qty 250

## 2018-09-12 MED ORDER — SODIUM CHLORIDE 0.9 % IV SOLN
Freq: Once | INTRAVENOUS | Status: AC
  Administered 2018-09-12: 16:00:00 via INTRAVENOUS
  Filled 2018-09-12: qty 250

## 2018-09-12 MED ORDER — SODIUM CHLORIDE 0.9 % IV SOLN
Freq: Once | INTRAVENOUS | Status: AC
  Filled 2018-09-12: qty 250

## 2018-09-12 MED ORDER — SODIUM CHLORIDE 0.9 % IV SOLN: 4.0000 mg | Freq: Once | INTRAVENOUS | Status: DC

## 2018-09-12 MED ORDER — ONDANSETRON HCL 4 MG/2ML IJ SOLN: 4.0000 mg | Freq: Once | INTRAMUSCULAR | Status: AC

## 2018-09-12 MED ORDER — OXYCODONE-ACETAMINOPHEN 5-325 MG PO TABS: 1.0000 | ORAL_TABLET | Freq: Four times a day (QID) | ORAL | 0 refills | Status: DC | PRN

## 2018-09-12 MED FILL — OXYCODONE-ACETAMINOPHEN 5-3: 5-325 | 7 days supply | Qty: 60 | Fill #0

## 2018-09-12 NOTE — Progress Notes (Signed)
Radiation Oncology         [phone] ________________________________  Name: [name] MRN: [id number]  Date: 09/12/2018  DOB: 04-Jul-1951  Follow-Up Visit Note  CC: Eston Esters, NP  Jerrell Belfast, MD  Diagnosis and Prior Radiotherapy:       ICD-10-CM   1. 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 He reports he is feeling better overall. He reports pain and tightness to the incision site behind his right ear.   He was able to swallow milk, water, and half a peanut butter and jelly sandwich that was cut into very small pieces yesterday. He notes ensure and boost "come back up." He also reports continued thick saliva, which he uses a home suction machine for relief.   He is scheduled to receive IVF and see Sandi Mealy, PA later this afternoon. He states he will see Dr. Arnoldo Morale in March, he believes it's close to 10/16/2018. Wt Readings from Last 3 Encounters:  09/12/18 174 lb (78.9 kg)  09/01/18 174 lb 2.6 oz (79 kg)  08/29/18 175 lb 8 oz (79.6 kg)     ALLERGIES:  has No Known Allergies.  Meds: Current Outpatient Medications  Medication Sig Dispense Refill  . metFORMIN (GLUCOPHAGE) 1000 MG tablet Take 1,000 mg by mouth 2 (two) times daily with a meal.    . ondansetron (ZOFRAN ODT) 4 MG disintegrating tablet Take 1 tablet (4 mg total) by mouth every 8 (eight) hours as needed for nausea or vomiting. Place under tongue 20 tablet 0  . amoxicillin-clavulanate (AUGMENTIN ES-600)  600-42.9 MG/5ML suspension Take 5 mLs (600 mg total) by mouth 2 (two) times daily. (Patient not taking: Reported on 09/12/2018) 75 mL 0  . atorvastatin (LIPITOR) 20 MG tablet Take 20 mg by mouth daily at 6 PM.     . celecoxib (CELEBREX) 200 MG capsule TK 1 C PO Q 12 H PRN    . fentaNYL (DURAGESIC) 25 MCG/HR     . fluconazole (DIFLUCAN) 100 MG tablet HOLD ATORVASTATIN while on this medication. Take 2 tablets today, then 1 tablet daily x 20 more days. (Patient not taking: Reported on 08/25/2018) 22 tablet 0  . gabapentin (NEURONTIN) 300 MG capsule Take 300 mg by mouth 3 (three) times daily.    . Ibuprofen 40 MG/ML SUSP Take 10 mLs (400 mg total) by mouth every 6 (six) hours as needed (for pain, take with food. Take instead of ibuprofen pills.). (Patient not taking: Reported on 09/12/2018) 500 mL 3  . lidocaine (LIDODERM) 5 % Place 1 patch onto the skin daily. Remove & Discard patch within 12 hours or as directed by MD (Patient not taking: Reported on 08/25/2018) 5 patch 0  . lidocaine (XYLOCAINE) 2 % solution Patient: Mix 1part 2% viscous lidocaine, 1part H20. Swish & swallow 48mL of diluted mixture, 59min before meals and at bedtime, up to QID (Patient not taking: Reported on 09/12/2018) 100 mL 5  . lidocaine (XYLOCAINE) 2 % solution Use as directed  5 mLs in the mouth or throat every 4 (four) hours as needed for mouth pain. Swish and swallow (Patient not taking: Reported on 09/12/2018) 100 mL 1  . Multiple Vitamins-Minerals (MULTIVITAMIN WITH IRON-MINERALS) liquid Take 15 mLs by mouth daily. Drink milk before taking this to protect stomach. (Patient not taking: Reported on 09/12/2018) 236 mL 3  . Naproxen Sod-diphenhydrAMINE (ALEVE PM) 220-25 MG TABS Take 2 tablets by mouth as directed. Three times weekly for sleep    . oxyCODONE-acetaminophen (PERCOCET/ROXICET) 5-325 MG tablet Take 1-2 tablets by mouth every 6 (six) hours as needed for severe pain. 60 tablet 0  . polyethylene glycol-electrolytes  (NULYTELY/GOLYTELY) 420 g solution Take 4,000 mLs by mouth as directed. (Patient not taking: Reported on 09/12/2018) 4000 mL 0  . promethazine (PHENERGAN) 25 MG tablet Take 1 tablet (25 mg total) by mouth every 6 (six) hours as needed for nausea or vomiting. (Patient not taking: Reported on 09/12/2018) 30 tablet 2  . sodium fluoride (PREVIDENT 5000 PLUS) 1.1 % CREA dental cream Apply cream to tooth brush. Brush teeth for 2 minutes. Spit out excess-DO NOT swallow. Repeat nightly. (Patient not taking: Reported on 08/25/2018) 1 Tube prn  . sucralfate (CARAFATE) 1 g tablet Dissolve 1 tablet in 10 mL H20 and swallow 30 min prior to meals and bedtime. (Patient not taking: Reported on 09/12/2018) 40 tablet 5  . sucralfate (CARAFATE) 1 g tablet Take 1 tablet (1 g total) by mouth 4 (four) times daily -  with meals and at bedtime. Dissolve in 2 ounces of water then swallow (Patient not taking: Reported on 09/12/2018) 120 tablet 1  . tamsulosin (FLOMAX) 0.4 MG CAPS capsule Take 1 capsule (0.4 mg total) by mouth daily. (Patient not taking: Reported on 09/12/2018) 14 capsule 0   No current facility-administered medications for this encounter.    Facility-Administered Medications Ordered in Other Encounters  Medication Dose Route Frequency Provider Last Rate Last Dose  . 0.9 %  sodium chloride infusion   Intravenous Once Harle Stanford., PA-C      . ondansetron [facility]) injection 4 mg  4 mg Intravenous Once Harle Stanford., PA-C        Physical Findings: The patient is in no acute distress. Patient is alert and oriented. Wt Readings from Last 3 Encounters:  09/12/18 174 lb (78.9 kg)  09/01/18 174 lb 2.6 oz (79 kg)  08/29/18 175 lb 8 oz (79.6 kg)    weight is 174 lb (78.9 kg). His oral temperature is 98.2 F (36.8 C). His blood pressure is 138/70 and his pulse is 94. His oxygen saturation is 99%. .  General: Alert and oriented, in no acute distress HEENT: Head is normocephalic. Extraocular movements are intact.  Oropharynx is clear. Right and left tympanic membranes are clear. Scant amount of mucositis that continues to resolve. No thrush. Neck: Neck is notable for no palpable masses. Skin: Skin in treatment fields is a little dry and darkened but overall shows satisfactory healing. Incision behind his right ear is intact. Lymphatics: see Neck Exam Psychiatric: Judgment and insight are intact. Affect is appropriate.   Lab Findings: Lab Results  Component Value Date   WBC 5.1 08/25/2018   HGB 16.4 08/25/2018   HCT 48.9 08/25/2018   MCV 89.1 08/25/2018   PLT 190 08/25/2018    Lab Results  Component Value Date   TSH 0.763 08/25/2018    Radiographic Findings: No results found.  Impression/Plan:  Secondary Head and Neck Cancer  1) Head  and Neck Cancer Status: no evidence of recurrence on clinical exam  2) Nutritional Status: He continues to work with Ernestene Kiel in nutrition. Weight is stable. PEG tube: no  3) Swallowing: continues to have difficulty but improving significantly.  4) Thyroid function:  Lab Results  Component Value Date   TSH 0.763 08/25/2018   5) Follow-up in 1 month. We will find out when he is scheduled with Dr. Arnoldo Morale, and we will plan to see him before that. I will order his CT scans to take place prior to his appointment with me for restaging.  This restaging will hopefully rule out metastatic disease to allow NSU to proceed with spine surgery. The patient was encouraged to call with any issues or questions before then.  Continue IVF for 1.5 more weeks.  I spent 20  minutes face to face with the patient and more than 50% of that time was spent in counseling and/or coordination of care. _____________________________________   Eppie Gibson, MD  This document serves as a record of services personally performed by Eppie Gibson, MD. It was created on her behalf by Wilburn Mylar, a trained medical scribe. The creation of this record is based on the scribe's  personal observations and the provider's statements to them. This document has been checked and approved by the attending provider.

## 2018-09-12 NOTE — Progress Notes (Signed)
Nutrition follow-up completed with patient during IV fluids. Patient is status post radiation therapy for metastatic head and neck cancer with unknown primary. Last weight documented was 174 pounds on February 10.  Weight is stable overall. Patient reports he is drinking one fourth of a gallon of milk a day.  He is not tolerating Ensure or boost and reports he does not eat food. Patient has suction machine, helping to clear thick secretions. Patient is receiving IV fluids. Labs were reviewed and were normal except for calcium increase slightly at 10.6.  Estimated nutrition needs: 2400-2600 cal, 112-125 g protein, 2.5 L fluid.  Nutrition diagnosis: Inadequate oral intake continues.  Intervention: Repeated importance of patient trying to increase oral intake of liquids and soft foods. Encouraged him to rinse his mouth out with baking soda and salt water and use suction prior to trying to eat. I have continued to provide patient with multiple samples of Ensure Enlive.  Again suggested that he thin Ensure Enlive down with milk to a proper consistency.  Patient has demonstrated tolerance of this in the past. If patient continues to be unable to swallow food and liquids other than milk, recommend feeding tube placement to increase nutrition status and increase healing.  Monitoring, evaluation, goals: Patient will work to increase overall calories and protein via oral nutrition supplements and milk to increase healing.  Next visit: Monday, February 17 during infusion.  **Disclaimer: This note was dictated with voice recognition software. Similar sounding words can inadvertently be transcribed and this note may contain transcription errors which may not have been corrected upon publication of note.**

## 2018-09-12 NOTE — Progress Notes (Signed)
[name] was seen in the infusion room today while he was receiving IV hydration. He was given 2 liters of normal saline yesterday. He is feeling significantly better with IV fluids. He would like to continue receiving these daily for the next 2 weeks. He will return tomorrow for fluids again. I will continue seeing him daily as he is receiving fluids and will assess his ongoing rehydration. He requests an refills of percocet. This was provided as requested. Transportation is being arranged for him for Saturday should he desire fluids.  Sandi Mealy, MHS, PA-C Physician Assistant

## 2018-09-13 ENCOUNTER — Encounter: Payer: Self-pay | Admitting: Radiation Oncology

## 2018-09-13 ENCOUNTER — Other Ambulatory Visit: Payer: Self-pay | Admitting: Radiation Oncology

## 2018-09-13 ENCOUNTER — Inpatient Hospital Stay (HOSPITAL_BASED_OUTPATIENT_CLINIC_OR_DEPARTMENT_OTHER): Payer: Medicare HMO | Admitting: Medical

## 2018-09-13 ENCOUNTER — Encounter: Payer: Self-pay | Admitting: General Practice

## 2018-09-13 VITALS — BP 146/93 | HR 106 | Temp 98.1°F | Resp 17 | Ht 73.0 in | Wt 172.1 lb

## 2018-09-13 DIAGNOSIS — E860 Dehydration: Secondary | ICD-10-CM

## 2018-09-13 DIAGNOSIS — C7989 Secondary malignant neoplasm of other specified sites: Secondary | ICD-10-CM

## 2018-09-13 DIAGNOSIS — C801 Malignant (primary) neoplasm, unspecified: Secondary | ICD-10-CM

## 2018-09-13 DIAGNOSIS — C770 Secondary and unspecified malignant neoplasm of lymph nodes of head, face and neck: Secondary | ICD-10-CM

## 2018-09-13 DIAGNOSIS — K2289 Other specified disease of esophagus: Secondary | ICD-10-CM

## 2018-09-13 DIAGNOSIS — F331 Major depressive disorder, recurrent, moderate: Secondary | ICD-10-CM

## 2018-09-13 DIAGNOSIS — M25569 Pain in unspecified knee: Secondary | ICD-10-CM

## 2018-09-13 DIAGNOSIS — M7918 Myalgia, other site: Secondary | ICD-10-CM

## 2018-09-13 DIAGNOSIS — R4702 Dysphasia: Secondary | ICD-10-CM

## 2018-09-13 DIAGNOSIS — K229 Disease of esophagus, unspecified: Secondary | ICD-10-CM

## 2018-09-13 DIAGNOSIS — M542 Cervicalgia: Secondary | ICD-10-CM

## 2018-09-13 DIAGNOSIS — K228 Other specified diseases of esophagus: Secondary | ICD-10-CM

## 2018-09-13 DIAGNOSIS — K740 Hepatic fibrosis: Secondary | ICD-10-CM

## 2018-09-13 MED ORDER — SERTRALINE HCL 50 MG PO TABS: 50.0000 mg | ORAL_TABLET | Freq: Every day | ORAL | 5 refills | Status: DC

## 2018-09-13 MED ORDER — MAGIC MOUTHWASH: 5.0000 mL | Freq: Four times a day (QID) | ORAL | 2 refills | Status: DC | PRN

## 2018-09-13 MED ORDER — OXYCODONE HCL 5 MG PO TABS: 5.0000 mg | ORAL_TABLET | ORAL | 0 refills | Status: DC | PRN

## 2018-09-13 MED ORDER — MORPHINE SULFATE (PF) 4 MG/ML IV SOLN
INTRAVENOUS | Status: AC
  Filled 2018-09-13: qty 1

## 2018-09-13 MED ORDER — SODIUM CHLORIDE 0.9 % IV SOLN
Freq: Once | INTRAVENOUS | Status: AC
  Administered 2018-09-13: 14:00:00 via INTRAVENOUS
  Filled 2018-09-13: qty 250

## 2018-09-13 MED ORDER — LIDOCAINE VISCOUS HCL 2 % MT SOLN: 5.0000 mL | OROMUCOSAL | 3 refills | Status: DC | PRN

## 2018-09-13 MED ORDER — MORPHINE SULFATE 4 MG/ML IJ SOLN
2.0000 mg | Freq: Once | INTRAMUSCULAR | Status: AC
  Administered 2018-09-13: 2 mg via INTRAVENOUS
  Filled 2018-09-13: qty 1

## 2018-09-13 MED FILL — SERTRALINE HCL 50 MG TABLET: 50 | 30 days supply | Qty: 30 | Fill #0

## 2018-09-13 MED FILL — oxyCODONE HCL 5 MG TABS: 5 | 15 days supply | Qty: 90 | Fill #0

## 2018-09-13 MED FILL — LIDOCAINE 2% VISCOUS SOLN: 2 | 10 days supply | Qty: 100 | Fill #0

## 2018-09-13 NOTE — Progress Notes (Signed)
[place] CSW Progress Notes  Met w patient in [facility] at request of providers.  Patient struggling w adjustment to illness including bodily changes, discomfort being around others, anxiety, inability to eat, fears about choking or otherwise coming to harm while alone.  Lives in own apartment in senior housing complex, has support from daughter in [place].  Does not venture out of his apartment much, was normally a socially connected individual.  Feels uncertain about his cancer diagnosis/prognosis/return to 'normal' living.  Discussed various ways to cope w traumatic experience of cancer diagnosis and treatment as well as ways to reconnect w others in similar situations.  Provided information and brochures about various community programs available to patient, referred to [facility] Counseling intern for ongoing counseling.  No indication of suicidal ideation or plan, no significant signs of depression.  Does experience disordered eating and difficulty sleeping, both of which can be linked to his cancer treatment and ongoing pain.  Of note, patient experienced a traumatic fall of 55 feet off a building in 1999, underwent multiple surgeries for several fractures and underwent significant rehabilitation post traumatic accident.  CSW will connect w patient upon his return to [facility] on Friday.  Edwyna Shell, LCSW Clinical Social Worker Phone:  [phone]

## 2018-09-13 NOTE — Patient Instructions (Signed)

## 2018-09-13 NOTE — Progress Notes (Signed)
Pt presents today for IVF to [facility].  Pt given 1L NS and 2 mg morphine IV.  Reports feeling better with no pain after infusion.  Pt reports feelings of frustration, isolation, and depression at this time.  States "today is not a good day, I'm just so tired of being sick all the time".  Pt reports that besides his daughter his family does not attempt to inquire about his health or to assist him through his treatment.  Denies SI, HI, or AVH.  Denies thoughts of self harm but states "all of this is so hard and it feels like no one really understands or cares except the people in this clinic".  Pt tearful but calm and cooperative.  PA Lucianne Lei aware.  Social work contacted by [employer], Edwyna Shell was able to speak with patient today about support group/counseling options.  Pt spoke with SW, PA, and RN and reported feeling "much better after talking to people who care about all this".  SW to follow up with pt.  Pt given instructions to call as needed for further concerns or questions, verbalized understanding.  Transportation coordinated through [place].

## 2018-09-15 ENCOUNTER — Other Ambulatory Visit: Payer: Self-pay | Admitting: Medical

## 2018-09-15 ENCOUNTER — Inpatient Hospital Stay: Payer: Medicare HMO

## 2018-09-15 ENCOUNTER — Other Ambulatory Visit: Payer: Self-pay | Admitting: Medical Oncology

## 2018-09-15 VITALS — BP 93/64 | HR 88 | Temp 98.4°F | Resp 17

## 2018-09-15 DIAGNOSIS — R110 Nausea: Secondary | ICD-10-CM

## 2018-09-15 DIAGNOSIS — R1310 Dysphagia, unspecified: Secondary | ICD-10-CM

## 2018-09-15 DIAGNOSIS — R1319 Other dysphagia: Secondary | ICD-10-CM

## 2018-09-15 DIAGNOSIS — C770 Secondary and unspecified malignant neoplasm of lymph nodes of head, face and neck: Secondary | ICD-10-CM

## 2018-09-15 DIAGNOSIS — E860 Dehydration: Secondary | ICD-10-CM

## 2018-09-15 MED ORDER — SODIUM CHLORIDE 0.9 % IV SOLN
INTRAVENOUS | Status: DC
  Filled 2018-09-15: qty 250

## 2018-09-15 MED ORDER — ONDANSETRON HCL 4 MG/2ML IJ SOLN: 4.0000 mg | Freq: Once | INTRAMUSCULAR | Status: DC

## 2018-09-15 MED ORDER — SODIUM CHLORIDE 0.9 % IV SOLN
INTRAVENOUS | Status: DC
  Administered 2018-09-15: 16:00:00 via INTRAVENOUS
  Filled 2018-09-15 (×2): qty 250

## 2018-09-15 NOTE — Addendum Note (Signed)
Addended by: Delane Ginger on: 09/15/2018 04:07 PM   Modules accepted: Orders

## 2018-09-15 NOTE — Patient Instructions (Signed)
Dehydration, Adult  Dehydration is when there is not enough fluid or water in your body. This happens when you lose more fluids than you take in. Dehydration can range from mild to very bad. It should be treated right away to keep it from getting very bad. Symptoms of mild dehydration may include:  Thirst.  Dry lips.  Slightly dry mouth.  Dry, warm skin.  Dizziness. Symptoms of moderate dehydration may include:  Very dry mouth.  Muscle cramps.  Dark pee (urine). Pee may be the color of tea.  Your body making less pee.  Your eyes making fewer tears.  Heartbeat that is uneven or faster than normal (palpitations).  Headache.  Light-headedness, especially when you stand up from sitting.  Fainting (syncope). Symptoms of very bad dehydration may include:  Changes in skin, such as: ? Cold and clammy skin. ? Blotchy (mottled) or pale skin. ? Skin that does not quickly return to normal after being lightly pinched and let go (poor skin turgor).  Changes in body fluids, such as: ? Feeling very thirsty. ? Your eyes making fewer tears. ? Not sweating when body temperature is high, such as in hot weather. ? Your body making very little pee.  Changes in vital signs, such as: ? Weak pulse. ? Pulse that is more than 100 beats a minute when you are sitting still. ? Fast breathing. ? Low blood pressure.  Other changes, such as: ? Sunken eyes. ? Cold hands and feet. ? Confusion. ? Lack of energy (lethargy). ? Trouble waking up from sleep. ? Short-term weight loss. ? Unconsciousness. Follow these instructions at home:   If told by your doctor, drink an ORS: ? Make an ORS by using instructions on the package. ? Start by drinking small amounts, about  cup (120 mL) every 5-10 minutes. ? Slowly drink more until you have had the amount that your doctor said to have.  Drink enough clear fluid to keep your pee clear or pale yellow. If you were told to drink an ORS, finish the  ORS first, then start slowly drinking clear fluids. Drink fluids such as: ? Water. Do not drink only water by itself. Doing that can make the salt (sodium) level in your body get too low (hyponatremia). ? Ice chips. ? Fruit juice that you have added water to (diluted). ? Low-calorie sports drinks.  Avoid: ? Alcohol. ? Drinks that have a lot of sugar. These include high-calorie sports drinks, fruit juice that does not have water added, and soda. ? Caffeine. ? Foods that are greasy or have a lot of fat or sugar.  Take over-the-counter and prescription medicines only as told by your doctor.  Do not take salt tablets. Doing that can make the salt level in your body get too high (hypernatremia).  Eat foods that have minerals (electrolytes). Examples include bananas, oranges, potatoes, tomatoes, and spinach.  Keep all follow-up visits as told by your doctor. This is important. Contact a doctor if:  You have belly (abdominal) pain that: ? Gets worse. ? Stays in one area (localizes).  You have a rash.  You have a stiff neck.  You get angry or annoyed more easily than normal (irritability).  You are more sleepy than normal.  You have a harder time waking up than normal.  You feel: ? Weak. ? Dizzy. ? Very thirsty.  You have peed (urinated) only a small amount of very dark pee during 6-8 hours. Get help right away if:  You have   symptoms of very bad dehydration.  You cannot drink fluids without throwing up (vomiting).  Your symptoms get worse with treatment.  You have a fever.  You have a very bad headache.  You are throwing up or having watery poop (diarrhea) and it: ? Gets worse. ? Does not go away.  You have blood or something green (bile) in your throw-up.  You have blood in your poop (stool). This may cause poop to look black and tarry.  You have not peed in 6-8 hours.  You pass out (faint).  Your heart rate when you are sitting still is more than 100 beats a  minute.  You have trouble breathing. This information is not intended to replace advice given to you by your health care provider. Make sure you discuss any questions you have with your health care provider. Document Released: 05/15/2009 Document Revised: 02/06/2016 Document Reviewed: 09/12/2015 Elsevier Interactive Patient Education  2019 Elsevier Inc.  

## 2018-09-15 NOTE — Progress Notes (Signed)
Symptoms Management Clinic Progress Note   [name] [id number] 1951-04-05 68 y.o.  [name] is managed by Dr. Eppie Gibson  Actively treated with chemotherapy/immunotherapy/hormonal therapy: Patient is status post radiation to the right neck.  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  Metastatic squamous cell carcinoma of the head neck: [name] continues to be managed by Dr. Eppie Gibson and has completed radiation therapy.  Esophageal pain: Patient was given a prescription for OXY IR 5 mg as he is to avoid any medications with Tylenol due to his hepatic fibrosis.  He was given a prescription for Percocet earlier this week but has agreed to return this to the pharmacy when picking up his next prescription.  He was given a prescription for viscous lidocaine and Magic mouthwash today.  He was also given 2 mg of morphine IV.  Dehydration: The patient was given 1 L of normal saline today and will return on 09/15/2018 and possibly on 09/16/2018 for additional IV fluids.  Please see After Visit Summary for patient specific instructions.  Future Appointments  Date Time Provider [place]  09/15/2018  3:30 PM [facility] INFUSION [facility] None  09/18/2018 10:15 AM Sharen Counter, CCC-SLP [facility]  09/18/2018  2:30 PM [facility] INFUSION [facility] None  09/18/2018  3:15 PM Karie Mainland, RD [facility] None  09/22/2018  3:30 PM [facility] INFUSION [facility] None    No orders of the defined types were placed in  this encounter.      Subjective:   Patient ID:  [name] is a 68 y.o. (DOB 1950-11-05) male.  Chief Complaint:  Chief Complaint  Patient presents with  . Dehydration    HPI [name]   is a 68 year old male with a history of a metastatic squamous cell carcinoma of the head and neck who has completed radiation therapy under the care of Dr. Eppie Gibson.  He has been receiving IV fluids regularly.  He presents to the clinic today for additional IV fluids.  He was given a prescription for Percocet earlier this week but states that he cannot take Percocet because of the Tylenol component.  He is in agreement to return this prescription to the pharmacy at which time he will be given a prescription for oxycodone 5 mg.  He is also having neck pain, knee pain, and other musculoskeletal pains.  He states that he is sad and is tired of all of the medical issues that he is undergone.  He reports that his dysphasia is causing him to feel depressed.  He reports that he goes home and closes himself in his house without any contact with others.  He denies homicidal or suicidal ideations.  Medications: I have reviewed the patient's current medications.  Allergies: No Known Allergies  Past Medical History:  Diagnosis Date  . Alcohol abuse    until age 41  . Arthritis   . Bell's palsy 05/2017  . BPH (benign prostatic hyperplasia)   . Chronic hepatitis C without hepatic coma (HCC) FOLLOWED BY INFECTOUS DISEASE  DR COMER   POSITIVE ANTIBODY TEST THIS YEAR 2016--  CURRENT TX ON HARVONI PO- completeed 2016  . Chronic low back pain   . Collapsed lung    right .Marland Kitchen..15 yrs ago, fell and hit some bricks  . DDD (degenerative disc disease), lumbosacral   . Depression    no meds now- med used for bladder  . 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 ED (erectile dysfunction)   . Foley catheter in place    removed, post lumbar surgery- cath left in throughh rehab- had 2 UTI's  .  GERD (gastroesophageal reflux disease)    hx none in long time  . History of cocaine abuse ([place])    per pt quit and last used 06-03-2013  . History of radiation therapy 07/11/18- 09/01/18   Oropharynx/ treated to 60 Gy in 30 fractions of 2 Gy  . Lymphoma ([place])   . Urinary retention     Past Surgical History:  Procedure Laterality Date  . APPENDECTOMY  1980's  . BACK SURGERY     lower back x2  . [place]  . CLOSED REDUCTION AND INTRAMAXILLARY FIXATION BILATERAL MENTAL FX'S/  PLACEMENT MAXILLARY STENT/ APERTURE WIRE PLACEMENT  01-04-2000  . DIRECT LARYNGOSCOPY N/A 04/21/2018   Procedure: DIRECT LARYNGOSCOPY WITH BIOPSY OF TONGUE BASE AND NASOPHARYNX;  Surgeon: Jerrell Belfast, MD;  Location: [place];  Service: ENT;  Laterality: N/A;  Direct laryngoscopy with biopsy of tongue base and nasopharynx  . JOINT REPLACEMENT    . POSTERIOR LUMBAR FUSION  03-05-2015   laminectomy /  nerve decompression--  L4 -- S1  . RADICAL NECK DISSECTION Right 05/31/2018   Procedure: RADICAL MODIFIED RADICAL NECK DISSECTION;  Surgeon: Jerrell Belfast, MD;  Location: [place];  Service: ENT;  Laterality: Right;  . RADIOLOGY WITH ANESTHESIA N/A 08/08/2018   Procedure: MRI CERVICAL SPINE WITHOUT CONTRAST;  Surgeon: Radiologist, Medication, MD;  Location: [place];  Service: Radiology;  Laterality: N/A;  . TONSILLECTOMY  04/21/2018   Procedure: RIGHT TONSILLECTOMY;  Surgeon: Jerrell Belfast, MD;  Location: [place];  Service: ENT;;  . TONSILLECTOMY    . TOTAL KNEE ARTHROPLASTY Left 09/21/2013   Procedure: LEFT TOTAL KNEE ARTHROPLASTY;  Surgeon: Yvette Rack., MD;  Location: [place];  Service: Orthopedics;  Laterality: Left;  . TRANSURETHRAL RESECTION OF PROSTATE N/A 06/16/2015   Procedure: TRANSURETHRAL RESECTION OF THE PROSTATE WITH GYRUS INSTRUMENTS;  Surgeon: Franchot Gallo, MD;  Location: [facility];  Service: Urology;  Laterality: N/A;    Family History  Problem Relation Age of  Onset  . Liver cancer Paternal Grandmother   . Bone cancer Paternal Grandfather   . Stomach cancer Paternal Uncle   . Colon cancer Neg Hx   . Esophageal cancer Neg Hx   . Pancreatic cancer Neg Hx   . Inflammatory bowel disease Neg Hx   . Rectal cancer Neg Hx     Social History   Socioeconomic History  . Marital status: Divorced    Spouse name: Not on file  . Number of children: Not on file  . Years of education: Not on file  . Highest education level: Not on file  Occupational History  . Occupation: [employer]  . Financial resource strain: Not on file  . Food insecurity:    Worry: Not on file    Inability: Not on file  . Transportation needs:    Medical: Yes  Non-medical: Yes  Tobacco Use  . Smoking status: Never Smoker  . Smokeless tobacco: Never Used  Substance and Sexual Activity  . Alcohol use: Not Currently    Comment: occ, heavy drinker prior to age 13, completely stopped 14 months ago; none as off 05/29/18  . Drug use: Not Currently    Types: "Crack" cocaine    Comment: per pt quit and last used crack 04/16/17  . Sexual activity: Not on file    Comment: declined condoms  Lifestyle  . Physical activity:    Days per week: Not on file    Minutes per session: Not on file  . Stress: Not on file  Relationships  . Social connections:    Talks on phone: Not on file    Gets together: Not on file    Attends religious service: Not on file    Active member of club or organization: Not on file    Attends meetings of clubs or organizations: Not on file    Relationship status: Not on file  . Intimate partner violence:    Fear of current or ex partner: No    Emotionally abused: No    Physically abused: No    Forced sexual activity: No  Other Topics Concern  . Not on file  Social History Narrative  . Not on file    Past Medical History, Surgical history, Social history, and Family history were reviewed and updated as appropriate.   Please see review of  systems for further details on the patient's review from today.   Review of Systems:  Review of Systems  Constitutional: Positive for appetite change, fatigue and unexpected weight change. Negative for activity change, chills, diaphoresis and fever.  HENT: Positive for ear pain, sore throat and trouble swallowing.   Respiratory: Negative for cough, choking and shortness of breath.   Cardiovascular: Negative for chest pain.  Gastrointestinal: Positive for nausea. Negative for vomiting.    Objective:   Physical Exam:  BP (!) 146/93 (BP Location: Left Arm, Patient Position: Sitting) Comment: Engineering geologist was notified  Pulse (!) 106 Comment: Engineering geologist was notified  Temp 98.1 F (36.7 C) (Oral)   Resp 17   Ht 6\' 1"  (1.854 m)   Wt 172 lb 1.6 oz (78.1 kg)   SpO2 98%   BMI 22.71 kg/m  ECOG: 1  Physical Exam Constitutional:      General: He is not in acute distress.    Appearance: He is not diaphoretic.  HENT:     Head: Normocephalic and atraumatic.     Right Ear: Tympanic membrane, ear canal and external ear normal.     Left Ear: Tympanic membrane, ear canal and external ear normal.     Mouth/Throat:     Mouth: Mucous membranes are dry.     Pharynx: Oropharynx is clear.  Neck:     Comments: Hyperpigmentation along the right lateral neck and an area consistent with a radiation field. Cardiovascular:     Rate and Rhythm: Regular rhythm. Tachycardia present.     Heart sounds: Normal heart sounds. No murmur. No friction rub. No gallop.   Pulmonary:     Effort: Pulmonary effort is normal. No respiratory distress.     Breath sounds: Normal breath sounds. No wheezing or rales.  Skin:    General: Skin is warm and dry.     Findings: No erythema or rash.  Neurological:     Mental Status: He is alert.  Gait: Gait normal.  Psychiatric:     Comments: The patient appears to be sad and intermittently tearful.     Lab Review:     Component Value Date/Time   NA 142 09/08/2018 1340     K 4.4 09/08/2018 1340   CL 103 09/08/2018 1340   CO2 25 09/08/2018 1340   GLUCOSE 96 09/08/2018 1340   BUN 18 09/08/2018 1340   CREATININE 1.11 09/08/2018 1340   CREATININE 1.08 08/25/2018 0939   CREATININE 1.04 01/26/2016 1025   CALCIUM 10.6 (H) 09/08/2018 1340   PROT 7.8 08/25/2018 0939   ALBUMIN 4.0 08/25/2018 0939   AST 25 08/25/2018 0939   ALT 30 08/25/2018 0939   ALKPHOS 66 08/25/2018 0939   BILITOT 0.6 08/25/2018 0939   GFRNONAA >60 09/08/2018 1340   GFRNONAA >60 08/25/2018 0939   GFRNONAA 76 01/26/2016 1025   GFRAA >60 09/08/2018 1340   GFRAA >60 08/25/2018 0939   GFRAA 88 01/26/2016 1025       Component Value Date/Time   WBC 5.1 08/25/2018 0939   WBC 8.1 06/06/2018 1336   RBC 5.49 08/25/2018 0939   HGB 16.4 08/25/2018 0939   HCT 48.9 08/25/2018 0939   PLT 190 08/25/2018 0939   MCV 89.1 08/25/2018 0939   MCH 29.9 08/25/2018 0939   MCHC 33.5 08/25/2018 0939   RDW 13.1 08/25/2018 0939   LYMPHSABS 0.5 (L) 08/25/2018 0939   MONOABS 0.5 08/25/2018 0939   EOSABS 0.1 08/25/2018 0939   BASOSABS 0.0 08/25/2018 0939   -------------------------------  Imaging from last 24 hours (if applicable):  Radiology interpretation: No results found.

## 2018-09-16 ENCOUNTER — Other Ambulatory Visit: Payer: Self-pay | Admitting: Medical Oncology

## 2018-09-16 ENCOUNTER — Inpatient Hospital Stay: Payer: Medicare HMO

## 2018-09-16 ENCOUNTER — Encounter: Payer: Self-pay | Admitting: *Deleted

## 2018-09-16 DIAGNOSIS — C7989 Secondary malignant neoplasm of other specified sites: Secondary | ICD-10-CM

## 2018-09-16 MED ORDER — SODIUM CHLORIDE 0.9 % IV SOLN
Freq: Once | INTRAVENOUS | Status: DC
  Filled 2018-09-16: qty 250

## 2018-09-16 NOTE — Patient Instructions (Signed)
Dehydration, Adult  Dehydration is a condition in which there is not enough fluid or water in the body. This happens when you lose more fluids than you take in. Important organs, such as the kidneys, brain, and heart, cannot function without a proper amount of fluids. Any loss of fluids from the body can lead to dehydration. Dehydration can range from mild to severe. This condition should be treated right away to prevent it from becoming severe. What are the causes? This condition may be caused by:  Vomiting.  Diarrhea.  Excessive sweating, such as from heat exposure or exercise.  Not drinking enough fluid, especially: ? When ill. ? While doing activity that requires a lot of energy.  Excessive urination.  Fever.  Infection.  Certain medicines, such as medicines that cause the body to lose excess fluid (diuretics).  Inability to access safe drinking water.  Reduced physical ability to get adequate water and food. What increases the risk? This condition is more likely to develop in people:  Who have a poorly controlled long-term (chronic) illness, such as diabetes, heart disease, or kidney disease.  Who are age 65 or older.  Who are disabled.  Who live in a place with high altitude.  Who play endurance sports. What are the signs or symptoms? Symptoms of mild dehydration may include:  Thirst.  Dry lips.  Slightly dry mouth.  Dry, warm skin.  Dizziness. Symptoms of moderate dehydration may include:  Very dry mouth.  Muscle cramps.  Dark urine. Urine may be the color of tea.  Decreased urine production.  Decreased tear production.  Heartbeat that is irregular or faster than normal (palpitations).  Headache.  Light-headedness, especially when you stand up from a sitting position.  Fainting (syncope). Symptoms of severe dehydration may include:  Changes in skin, such as: ? Cold and clammy skin. ? Blotchy (mottled) or pale skin. ? Skin that does  not quickly return to normal after being lightly pinched and released (poor skin turgor).  Changes in body fluids, such as: ? Extreme thirst. ? No tear production. ? Inability to sweat when body temperature is high, such as in hot weather. ? Very little urine production.  Changes in vital signs, such as: ? Weak pulse. ? Pulse that is more than 100 beats a minute when sitting still. ? Rapid breathing. ? Low blood pressure.  Other changes, such as: ? Sunken eyes. ? Cold hands and feet. ? Confusion. ? Lack of energy (lethargy). ? Difficulty waking up from sleep. ? Short-term weight loss. ? Unconsciousness. How is this diagnosed? This condition is diagnosed based on your symptoms and a physical exam. Blood and urine tests may be done to help confirm the diagnosis. How is this treated? Treatment for this condition depends on the severity. Mild or moderate dehydration can often be treated at home. Treatment should be started right away. Do not wait until dehydration becomes severe. Severe dehydration is an emergency and it needs to be treated in a hospital. Treatment for mild dehydration may include:  Drinking more fluids.  Replacing salts and minerals in your blood (electrolytes) that you may have lost. Treatment for moderate dehydration may include:  Drinking an oral rehydration solution (ORS). This is a drink that helps you replace fluids and electrolytes (rehydrate). It can be found at pharmacies and retail stores. Treatment for severe dehydration may include:  Receiving fluids through an IV tube.  Receiving an electrolyte solution through a feeding tube that is passed through your nose and   into your stomach (nasogastric tube, or NG tube).  Correcting any abnormalities in electrolytes.  Treating the underlying cause of dehydration. Follow these instructions at home:  If directed by your health care provider, drink an ORS: ? Make an ORS by following instructions on the  package. ? Start by drinking small amounts, about  cup (120 mL) every 5-10 minutes. ? Slowly increase how much you drink until you have taken the amount recommended by your health care provider.  Drink enough clear fluid to keep your urine clear or pale yellow. If you were told to drink an ORS, finish the ORS first, then start slowly drinking other clear fluids. Drink fluids such as: ? Water. Do not drink only water. Doing that can lead to having too little salt (sodium) in the body (hyponatremia). ? Ice chips. ? Fruit juice that you have added water to (diluted fruit juice). ? Low-calorie sports drinks.  Avoid: ? Alcohol. ? Drinks that contain a lot of sugar. These include high-calorie sports drinks, fruit juice that is not diluted, and soda. ? Caffeine. ? Foods that are greasy or contain a lot of fat or sugar.  Take over-the-counter and prescription medicines only as told by your health care provider.  Do not take sodium tablets. This can lead to having too much sodium in the body (hypernatremia).  Eat foods that contain a healthy balance of electrolytes, such as bananas, oranges, potatoes, tomatoes, and spinach.  Keep all follow-up visits as told by your health care provider. This is important. Contact a health care provider if:  You have abdominal pain that: ? Gets worse. ? Stays in one area (localizes).  You have a rash.  You have a stiff neck.  You are more irritable than usual.  You are sleepier or more difficult to wake up than usual.  You feel weak or dizzy.  You feel very thirsty.  You have urinated only a small amount of very dark urine over 6-8 hours. Get help right away if:  You have symptoms of severe dehydration.  You cannot drink fluids without vomiting.  Your symptoms get worse with treatment.  You have a fever.  You have a severe headache.  You have vomiting or diarrhea that: ? Gets worse. ? Does not go away.  You have blood or green matter  (bile) in your vomit.  You have blood in your stool. This may cause stool to look black and tarry.  You have not urinated in 6-8 hours.  You faint.  Your heart rate while sitting still is over 100 beats a minute.  You have trouble breathing. This information is not intended to replace advice given to you by your health care provider. Make sure you discuss any questions you have with your health care provider. Document Released: 07/19/2005 Document Revised: 02/13/2016 Document Reviewed: 09/12/2015 Elsevier Interactive Patient Education  2019 [employer]  

## 2018-09-18 ENCOUNTER — Inpatient Hospital Stay: Payer: Medicare HMO | Admitting: Nutrition

## 2018-09-18 ENCOUNTER — Inpatient Hospital Stay: Payer: Medicare HMO

## 2018-09-18 ENCOUNTER — Telehealth: Payer: Self-pay | Admitting: *Deleted

## 2018-09-18 ENCOUNTER — Ambulatory Visit: Payer: Medicare HMO | Attending: Radiation Oncology

## 2018-09-18 DIAGNOSIS — C7989 Secondary malignant neoplasm of other specified sites: Secondary | ICD-10-CM

## 2018-09-18 DIAGNOSIS — E860 Dehydration: Secondary | ICD-10-CM | POA: Diagnosis not present

## 2018-09-18 DIAGNOSIS — R1312 Dysphagia, oropharyngeal phase: Secondary | ICD-10-CM | POA: Insufficient documentation

## 2018-09-18 MED ORDER — SODIUM CHLORIDE 0.9 % IV SOLN
Freq: Once | INTRAVENOUS | Status: AC
  Administered 2018-09-18: 15:00:00 via INTRAVENOUS
  Filled 2018-09-18: qty 250

## 2018-09-18 NOTE — Progress Notes (Signed)
Patient reports he is swelling feeling better.  States he has begun eating. Reports he tolerated ravioli and rice and gravy over the weekend.  He is drinking 2 Ensure Plus daily. Weight was documented as 172 pounds on February 12. Labs were within normal limits.  Nutrition diagnosis:  Inadequate oral intake continues.  Intervention: Reviewed appropriate foods to add to patient's meal intake. Encouraged increased fluids to help thin secretions. Encourage patient to thin down Ensure plus or Ensure Enlive with milk as needed. Teach back method used.  Monitoring, evaluation, goals: Patient will tolerate increased calories and protein to minimize further weight loss.  Next visit: To be scheduled as needed.  **Disclaimer: This note was dictated with voice recognition software. Similar sounding words can inadvertently be transcribed and this note may contain transcription errors which may not have been corrected upon publication of note.**

## 2018-09-18 NOTE — Progress Notes (Signed)
Oncology Nurse Navigator Documentation  Met with Mr. Maciolek in Infusion where he was receiving scheduled IVF to check on his well-being. He reported feeling much better s/p several days of IVF this past week, stated he intends to keep IVF appts next week. I recognized his phone mailbox had been cleared.  I encouraged him to answer his phone/return VMM from [facility] as calls are typically re scheduling of appts/rides.  He agreed to do so. I indicated SLP appt for Monday will be rescheduled closer to 2:30 IVF, LYFT ride will be adjusted accordingly.  He voiced understanding. I encouraged him to call me as needed.  Gayleen Orem, RN, BSN Head & Neck Oncology Nurse [place] at [place] [phone]

## 2018-09-18 NOTE — Therapy (Signed)
[facility] [address] [place], [place], [address] Phone: [phone]   Fax:  [phone]  Speech Language Pathology Treatment  Patient Details  Name: [name] MRN: [id number] Date of Birth: Apr 29, 1951 Referring Provider (SLP): Eppie Gibson, MD   Encounter Date: 09/18/2018  End of Session - 09/18/18 1435    Visit Number  3    Number of Visits  4    Date for SLP Re-Evaluation  10/09/18    SLP Start Time  1410   pt unable to be found at 29   SLP Stop Time   1435    SLP Time Calculation (min)  25 min    Activity Tolerance  Patient tolerated treatment well       Past Medical History:  Diagnosis Date  . Alcohol abuse    until age 78  . Arthritis   . Bell's palsy 05/2017  . BPH (benign prostatic hyperplasia)   . Chronic hepatitis C without hepatic coma ([place]) FOLLOWED BY INFECTOUS DISEASE DR COMER   POSITIVE ANTIBODY TEST THIS YEAR 2016--  CURRENT TX ON HARVONI PO- completeed 2016  . Chronic low back pain   . Collapsed lung    right .[place] Kitchen..15 yrs ago, fell and hit some bricks  . DDD (degenerative disc disease), lumbosacral   . Depression    no meds now- med used for bladder  . 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 ED (erectile dysfunction)   . Foley catheter in place    removed, post lumbar surgery- cath left in throughh rehab- had 2 UTI's  . GERD (gastroesophageal reflux disease)    hx none in long time  . History of cocaine abuse ([place])    per pt quit and last used 06-03-2013  . History of radiation therapy 07/11/18- 09/01/18   Oropharynx/ treated to 60 Gy in 30 fractions of 2 Gy  . Lymphoma ([place])   . Urinary retention     Past Surgical History:  Procedure Laterality Date  . APPENDECTOMY  1980's  . BACK SURGERY     lower back x2  . [place]  . CLOSED REDUCTION AND INTRAMAXILLARY FIXATION BILATERAL MENTAL FX'S/  PLACEMENT MAXILLARY STENT/ APERTURE WIRE  PLACEMENT  01-04-2000  . DIRECT LARYNGOSCOPY N/A 04/21/2018   Procedure: DIRECT LARYNGOSCOPY WITH BIOPSY OF TONGUE BASE AND NASOPHARYNX;  Surgeon: Jerrell Belfast, MD;  Location: [facility];  Service: ENT;  Laterality: N/A;  Direct laryngoscopy with biopsy of tongue base and nasopharynx  . JOINT REPLACEMENT    . POSTERIOR LUMBAR FUSION  03-05-2015   laminectomy /  nerve decompression--  L4 -- S1  . RADICAL NECK DISSECTION Right 05/31/2018   Procedure: RADICAL MODIFIED RADICAL NECK DISSECTION;  Surgeon: Jerrell Belfast, MD;  Location: [facility];  Service: ENT;  Laterality: Right;  . RADIOLOGY WITH ANESTHESIA N/A 08/08/2018   Procedure: MRI CERVICAL SPINE WITHOUT CONTRAST;  Surgeon: Radiologist, Medication, MD;  Location: [facility];  Service: Radiology;  Laterality: N/A;  . TONSILLECTOMY  04/21/2018   Procedure: RIGHT TONSILLECTOMY;  Surgeon: Jerrell Belfast, MD;  Location: [facility];  Service: ENT;;  . TONSILLECTOMY    . TOTAL KNEE ARTHROPLASTY Left 09/21/2013   Procedure: LEFT TOTAL KNEE ARTHROPLASTY;  Surgeon: Yvette Rack., MD;  Location: [facility];  Service: Orthopedics;  Laterality: Left;  . TRANSURETHRAL RESECTION OF PROSTATE N/A 06/16/2015   Procedure: TRANSURETHRAL RESECTION OF THE PROSTATE WITH GYRUS INSTRUMENTS;  Surgeon:  Franchot Gallo, MD;  Location: [facility];  Service: Urology;  Laterality: N/A;    There were no vitals filed for this visit.  Subjective Assessment - 09/18/18 1417    Subjective  Pt arrives with cup, spitting thickened saliva into it. "That saliva so thick it's choking me."    Currently in Pain?  Yes    Pain Score  8     Pain Location  Throat    Pain Orientation  Right    Pain Descriptors / Indicators  Tightness;Shooting    Pain Type  Acute pain;Surgical pain    Pain Radiating Towards  ear    Pain Onset  1 to 4 weeks ago    Pain Frequency  Constant    Aggravating Factors   walking    Pain Relieving Factors  nothing really            ADULT SLP  TREATMENT - 09/18/18 1419      General Information   Behavior/Cognition  Alert;Cooperative;Pleasant mood      Treatment Provided   Treatment provided  Dysphagia      Dysphagia Treatment   Temperature Spikes Noted  No    Respiratory Status  Room air    Oral Cavity - Dentition  Missing dentition    Treatment Methods  Skilled observation;Therapeutic exercise;Patient/caregiver education;Compensation strategy training    Patient observed directly with PO's  Yes    Type of PO's observed  Thin liquids;Dysphagia 1 (puree)    Feeding  Able to feed self    Liquids provided via  Cup    Oral Phase Signs & Symptoms  --   none noted   Pharyngeal Phase Signs & Symptoms  --   none noted   Other treatment/comments  "It won't go down this (right) side." Eating oatmeal, ravioli spaghetti, yogurt, oodles noodles. Cautioned pt with high sodium content. Pt told SLP he was "doing a couple" of the HEP exercises regularly. When SLP had pt perfrom the "couple" he was doing consistently he performed a modification of the chin-fist pushback (chin tuck against resistance) and an exercise SLP had never seen. Pt req'd max A usually with HEP procedure. SLP opinion pt is not completing HEP at home. Pt was funtional/normal with purees  and thin liquids today. No overt s/s aspiriation PNA seen.       Assessment / Recommendations / Plan   Plan  Continue with current plan of care   d/c next session if pt likely not completing HEP     Dysphagia Recommendations   Diet recommendations  --   as tolerated/recommended after MBSS   Liquids provided via  Cup    Medication Administration  --   as tolerated     Progression Toward Goals   Progression toward goals  Not progressing toward goals (comment)   noncompliance        SLP Short Term Goals - 09/18/18 1446      SLP SHORT TERM GOAL #1   Title  pt will complete HEP with rare min A over two sessions    Status  Not Met      SLP SHORT TERM GOAL #2   Title  pt will  tell SLP why he is completing HEP     Status  Not Met      SLP SHORT TERM GOAL #3   Title  pt will tell SLP precautions from modified 07-10-18 with min questioning cues    Status  Not Met  SLP Long Term Goals - 09/18/18 1447      SLP LONG TERM GOAL #1   Title  pt will complete HEP with occasional min A over two sessions    Time  1    Period  --   visits   Status  On-going   from modified independence     SLP LONG TERM GOAL #2   Title  pt will tell SLP 3 overt s/s of aspiration PNA with modified independence    Time  1    Period  --   visits   Status  On-going      SLP LONG TERM GOAL #3   Title  pt will tell SLP how a food journal can foster a quicker return to a more-normalized diet, over two visits    Time  2    Period  --   visits   Status  On-going       Plan - 09/18/18 1442    Clinical Impression Statement  Pt with pharyngeal dysphagia ID'd December 2019 via modified (MBSS) -see that report under "imaging" for details. Today pt stated he completed "a couple exercises" from HEP daily however when asked to perform exercises did again had difficult time with correctly performing any exercise prescribed to pt. Again, SLP suspects pt likely not completing HEP. The probability of swallowing difficulty continues to exist with the initiation of radiation therapy. Pt will need to cont to be followed by SLP for regular assessment of accurate HEP completion as well as for safety with POs both during and following treatment/s. He will be scheduled for one more ST session and if it appears pt is not completing HEP discharge will be likely.     Speech Therapy Frequency  --   approx once every four weeks   Duration  --   4 visits   Treatment/Interventions  Aspiration precaution training;Pharyngeal strengthening exercises;Diet toleration management by SLP;Compensatory techniques;Internal/external aids;SLP instruction and feedback;Patient/family education;Trials of upgraded  texture/liquids;Cueing hierarchy;Environmental controls    Potential to Achieve Goals  Good    SLP Home Exercise Plan  provided today    Consulted and Agree with Plan of Care  Patient       Patient will benefit from skilled therapeutic intervention in order to improve the following deficits and impairments:   Dysphagia, oropharyngeal phase    Problem List Patient Active Problem List   Diagnosis Date Noted  . 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 Rectal bleeding 05/30/2018  . Dark stools 05/30/2018  . NSAID long-term use 05/30/2018  . Secondary malignancy of lymph nodes of head, face and neck ([place]) 05/12/2018  . 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 Cervical stenosis of spinal canal 05/10/2018  . Neck mass 04/21/2018  . Liver fibrosis 04/05/2018  . Fusion of lumbar spine 02/07/2017  . Constipation due to opioid therapy 01/27/2017  . Lumbar stenosis with neurogenic claudication 01/24/2017  . Enlarged prostate with urinary obstruction 06/16/2015  . Lumbar degenerative disc disease 03/05/2015  . Chronic hepatitis C without hepatic coma ([place]) 12/09/2014  . Chronic low back pain 01/17/2014  . Osteoarthritis of left knee 09/21/2013  . Alcohol dependence ([place]) 06/12/2013  . Cocaine dependence ([place]) 06/12/2013  . Substance induced mood disorder ([place]) 06/12/2013    [place] ,[place], [place]  09/18/2018, 2:51 PM  [facility] [address] [place], [place], [address] Phone: [phone]  Fax:  [phone]   Name: [name] MRN: [id number] Date of Birth: 09-09-1950

## 2018-09-18 NOTE — Telephone Encounter (Signed)
Oncology Nurse Navigator Documentation  In follow-up to Saturday's conversation in Infusion, called [name], [place] x2 informing him today's SLP appt has been rescheduled for 2:00 prior to 2:45 IVF appt.  I indicated LYFT has been schedule for 1:30 pick-up.  Requested call-back to confirm message receipt.  Gayleen Orem, RN, BSN Head & Neck Oncology Nurse [place] at [place] [phone]

## 2018-09-22 ENCOUNTER — Inpatient Hospital Stay: Payer: Medicare HMO

## 2018-09-22 DIAGNOSIS — E860 Dehydration: Secondary | ICD-10-CM | POA: Diagnosis not present

## 2018-09-22 DIAGNOSIS — C7989 Secondary malignant neoplasm of other specified sites: Secondary | ICD-10-CM

## 2018-09-22 MED ORDER — SODIUM CHLORIDE 0.9 % IV SOLN
Freq: Once | INTRAVENOUS | Status: AC
  Administered 2018-09-22: 16:00:00 via INTRAVENOUS
  Filled 2018-09-22: qty 250

## 2018-09-22 NOTE — Progress Notes (Signed)
VO from [place]:  "Let [name] know that since he is now eating and drinking regularly, he reports feeling much better, and his VS are stable I would recommend pushing oral hydration over the next few days rather than returning for IVF on Monday of next week.  If he feels dehydrated again on Tuesday or Wednesday of next week then he should call us to schedule an evaluation/more IVF, however he should stick to oral hydration at this time.  If he has any concerns or questions or worsening symptoms before then he can call as needed."  Read back to and confirmed by PA Lucianne Lei.  Pt given information, verbalized understanding of this and to call as needed.  Denies any further questions or concerns at this time.

## 2018-09-22 NOTE — Patient Instructions (Signed)
Dehydration, Adult  Dehydration is when there is not enough fluid or water in your body. This happens when you lose more fluids than you take in. Dehydration can range from mild to very bad. It should be treated right away to keep it from getting very bad. Symptoms of mild dehydration may include:  Thirst.  Dry lips.  Slightly dry mouth.  Dry, warm skin.  Dizziness. Symptoms of moderate dehydration may include:  Very dry mouth.  Muscle cramps.  Dark pee (urine). Pee may be the color of tea.  Your body making less pee.  Your eyes making fewer tears.  Heartbeat that is uneven or faster than normal (palpitations).  Headache.  Light-headedness, especially when you stand up from sitting.  Fainting (syncope). Symptoms of very bad dehydration may include:  Changes in skin, such as: ? Cold and clammy skin. ? Blotchy (mottled) or pale skin. ? Skin that does not quickly return to normal after being lightly pinched and let go (poor skin turgor).  Changes in body fluids, such as: ? Feeling very thirsty. ? Your eyes making fewer tears. ? Not sweating when body temperature is high, such as in hot weather. ? Your body making very little pee.  Changes in vital signs, such as: ? Weak pulse. ? Pulse that is more than 100 beats a minute when you are sitting still. ? Fast breathing. ? Low blood pressure.  Other changes, such as: ? Sunken eyes. ? Cold hands and feet. ? Confusion. ? Lack of energy (lethargy). ? Trouble waking up from sleep. ? Short-term weight loss. ? Unconsciousness. Follow these instructions at home:   If told by your doctor, drink an ORS: ? Make an ORS by using instructions on the package. ? Start by drinking small amounts, about  cup (120 mL) every 5-10 minutes. ? Slowly drink more until you have had the amount that your doctor said to have.  Drink enough clear fluid to keep your pee clear or pale yellow. If you were told to drink an ORS, finish the  ORS first, then start slowly drinking clear fluids. Drink fluids such as: ? Water. Do not drink only water by itself. Doing that can make the salt (sodium) level in your body get too low (hyponatremia). ? Ice chips. ? Fruit juice that you have added water to (diluted). ? Low-calorie sports drinks.  Avoid: ? Alcohol. ? Drinks that have a lot of sugar. These include high-calorie sports drinks, fruit juice that does not have water added, and soda. ? Caffeine. ? Foods that are greasy or have a lot of fat or sugar.  Take over-the-counter and prescription medicines only as told by your doctor.  Do not take salt tablets. Doing that can make the salt level in your body get too high (hypernatremia).  Eat foods that have minerals (electrolytes). Examples include bananas, oranges, potatoes, tomatoes, and spinach.  Keep all follow-up visits as told by your doctor. This is important. Contact a doctor if:  You have belly (abdominal) pain that: ? Gets worse. ? Stays in one area (localizes).  You have a rash.  You have a stiff neck.  You get angry or annoyed more easily than normal (irritability).  You are more sleepy than normal.  You have a harder time waking up than normal.  You feel: ? Weak. ? Dizzy. ? Very thirsty.  You have peed (urinated) only a small amount of very dark pee during 6-8 hours. Get help right away if:  You have   symptoms of very bad dehydration.  You cannot drink fluids without throwing up (vomiting).  Your symptoms get worse with treatment.  You have a fever.  You have a very bad headache.  You are throwing up or having watery poop (diarrhea) and it: ? Gets worse. ? Does not go away.  You have blood or something green (bile) in your throw-up.  You have blood in your poop (stool). This may cause poop to look black and tarry.  You have not peed in 6-8 hours.  You pass out (faint).  Your heart rate when you are sitting still is more than 100 beats a  minute.  You have trouble breathing. This information is not intended to replace advice given to you by your health care provider. Make sure you discuss any questions you have with your health care provider. Document Released: 05/15/2009 Document Revised: 02/06/2016 Document Reviewed: 09/12/2015 Elsevier Interactive Patient Education  2019 Elsevier Inc.  

## 2018-09-26 ENCOUNTER — Telehealth: Payer: Self-pay | Admitting: Medical

## 2018-09-26 NOTE — Telephone Encounter (Signed)
Scheduled appt per 2/25 sch message - left message on daughters phone  Pt vmail full.

## 2018-09-29 ENCOUNTER — Other Ambulatory Visit: Payer: Self-pay | Admitting: Medical

## 2018-09-29 ENCOUNTER — Encounter: Payer: Self-pay | Admitting: Nutrition

## 2018-09-29 ENCOUNTER — Telehealth: Payer: Self-pay | Admitting: *Deleted

## 2018-09-29 ENCOUNTER — Inpatient Hospital Stay: Payer: Medicare HMO

## 2018-09-29 VITALS — BP 124/73 | HR 106 | Temp 98.8°F | Resp 17

## 2018-09-29 DIAGNOSIS — K228 Other specified diseases of esophagus: Secondary | ICD-10-CM

## 2018-09-29 DIAGNOSIS — E860 Dehydration: Secondary | ICD-10-CM

## 2018-09-29 DIAGNOSIS — F331 Major depressive disorder, recurrent, moderate: Secondary | ICD-10-CM

## 2018-09-29 DIAGNOSIS — C770 Secondary and unspecified malignant neoplasm of lymph nodes of head, face and neck: Secondary | ICD-10-CM

## 2018-09-29 DIAGNOSIS — K2289 Other specified disease of esophagus: Secondary | ICD-10-CM

## 2018-09-29 MED ORDER — SERTRALINE HCL 50 MG PO TABS: 50.0000 mg | ORAL_TABLET | Freq: Every day | ORAL | 5 refills | Status: DC

## 2018-09-29 MED ORDER — LIDOCAINE VISCOUS HCL 2 % MT SOLN: OROMUCOSAL | 5 refills | Status: DC

## 2018-09-29 MED ORDER — OXYCODONE-ACETAMINOPHEN 5-325 MG PO TABS: 1.0000 | ORAL_TABLET | Freq: Four times a day (QID) | ORAL | 0 refills | Status: DC | PRN

## 2018-09-29 MED ORDER — OXYCODONE HCL 5 MG PO TABS: 5.0000 mg | ORAL_TABLET | ORAL | 0 refills | Status: DC | PRN

## 2018-09-29 MED ORDER — SODIUM CHLORIDE 0.9 % IV SOLN
1000.0000 mL | INTRAVENOUS | Status: AC
  Administered 2018-09-29: 1000 mL via INTRAVENOUS
  Filled 2018-09-29 (×2): qty 1000

## 2018-09-29 MED FILL — OXYCODONE-ACETAMINOPHEN 5-3: 5-325 | 7 days supply | Qty: 60 | Fill #0

## 2018-09-29 MED FILL — LIDOCAINE 2% VISCOUS SOLN: 2 | 5 days supply | Qty: 100 | Fill #0

## 2018-09-29 MED FILL — oxyCODONE HCL 5 MG TABS: 5 | 15 days supply | Qty: 90 | Fill #0

## 2018-09-29 NOTE — Progress Notes (Signed)
Provided patient with 1 complementary case of Ensure Enlive. 

## 2018-09-29 NOTE — Telephone Encounter (Signed)
Patient came to the infusion room with concerns of Rx pain medication. Patient produced bottle of Oxycodone/Acet 5-325. He states he is not able to take this medication with the "aspririn in it". He states he had this problem last month also. [name], [place] wrote new script for Oxycodone 5mg  and the Oxycodone/Acet 5/325 was dumped into the Steri-cycle.

## 2018-09-29 NOTE — Patient Instructions (Signed)
Dehydration, Adult  Dehydration is when there is not enough fluid or water in your body. This happens when you lose more fluids than you take in. Dehydration can range from mild to very bad. It should be treated right away to keep it from getting very bad. Symptoms of mild dehydration may include:  Thirst.  Dry lips.  Slightly dry mouth.  Dry, warm skin.  Dizziness. Symptoms of moderate dehydration may include:  Very dry mouth.  Muscle cramps.  Dark pee (urine). Pee may be the color of tea.  Your body making less pee.  Your eyes making fewer tears.  Heartbeat that is uneven or faster than normal (palpitations).  Headache.  Light-headedness, especially when you stand up from sitting.  Fainting (syncope). Symptoms of very bad dehydration may include:  Changes in skin, such as: ? Cold and clammy skin. ? Blotchy (mottled) or pale skin. ? Skin that does not quickly return to normal after being lightly pinched and let go (poor skin turgor).  Changes in body fluids, such as: ? Feeling very thirsty. ? Your eyes making fewer tears. ? Not sweating when body temperature is high, such as in hot weather. ? Your body making very little pee.  Changes in vital signs, such as: ? Weak pulse. ? Pulse that is more than 100 beats a minute when you are sitting still. ? Fast breathing. ? Low blood pressure.  Other changes, such as: ? Sunken eyes. ? Cold hands and feet. ? Confusion. ? Lack of energy (lethargy). ? Trouble waking up from sleep. ? Short-term weight loss. ? Unconsciousness. Follow these instructions at home:   If told by your doctor, drink an ORS: ? Make an ORS by using instructions on the package. ? Start by drinking small amounts, about  cup (120 mL) every 5-10 minutes. ? Slowly drink more until you have had the amount that your doctor said to have.  Drink enough clear fluid to keep your pee clear or pale yellow. If you were told to drink an ORS, finish the  ORS first, then start slowly drinking clear fluids. Drink fluids such as: ? Water. Do not drink only water by itself. Doing that can make the salt (sodium) level in your body get too low (hyponatremia). ? Ice chips. ? Fruit juice that you have added water to (diluted). ? Low-calorie sports drinks.  Avoid: ? Alcohol. ? Drinks that have a lot of sugar. These include high-calorie sports drinks, fruit juice that does not have water added, and soda. ? Caffeine. ? Foods that are greasy or have a lot of fat or sugar.  Take over-the-counter and prescription medicines only as told by your doctor.  Do not take salt tablets. Doing that can make the salt level in your body get too high (hypernatremia).  Eat foods that have minerals (electrolytes). Examples include bananas, oranges, potatoes, tomatoes, and spinach.  Keep all follow-up visits as told by your doctor. This is important. Contact a doctor if:  You have belly (abdominal) pain that: ? Gets worse. ? Stays in one area (localizes).  You have a rash.  You have a stiff neck.  You get angry or annoyed more easily than normal (irritability).  You are more sleepy than normal.  You have a harder time waking up than normal.  You feel: ? Weak. ? Dizzy. ? Very thirsty.  You have peed (urinated) only a small amount of very dark pee during 6-8 hours. Get help right away if:  You have   symptoms of very bad dehydration.  You cannot drink fluids without throwing up (vomiting).  Your symptoms get worse with treatment.  You have a fever.  You have a very bad headache.  You are throwing up or having watery poop (diarrhea) and it: ? Gets worse. ? Does not go away.  You have blood or something green (bile) in your throw-up.  You have blood in your poop (stool). This may cause poop to look black and tarry.  You have not peed in 6-8 hours.  You pass out (faint).  Your heart rate when you are sitting still is more than 100 beats a  minute.  You have trouble breathing. This information is not intended to replace advice given to you by your health care provider. Make sure you discuss any questions you have with your health care provider. Document Released: 05/15/2009 Document Revised: 02/06/2016 Document Reviewed: 09/12/2015 Elsevier Interactive Patient Education  2019 Elsevier Inc.  

## 2018-10-10 ENCOUNTER — Telehealth: Payer: Self-pay | Admitting: Emergency Medicine

## 2018-10-10 NOTE — Telephone Encounter (Signed)
Pt called stating "I think I'm gonna need fluids again even though I'm feeling much better. Can I come in Friday?"  Pt reports eating and drinking well, denies any complaints or concerns otherwise.  Since pt does not have any MD/APP appts scheduled at this time for f/u pt will be evaluated by [facility] at 0900 on 10/13/2018 per PA Sandi Mealy.  Pt VU of appts and to call before then with any issues.

## 2018-10-19 ENCOUNTER — Other Ambulatory Visit: Payer: Self-pay | Admitting: Medical

## 2018-10-19 DIAGNOSIS — C7989 Secondary malignant neoplasm of other specified sites: Secondary | ICD-10-CM

## 2018-10-20 ENCOUNTER — Other Ambulatory Visit: Payer: Self-pay

## 2018-10-20 ENCOUNTER — Other Ambulatory Visit: Payer: Self-pay | Admitting: Medical

## 2018-10-20 ENCOUNTER — Inpatient Hospital Stay: Payer: Medicare HMO | Attending: Hematology | Admitting: Medical

## 2018-10-20 ENCOUNTER — Inpatient Hospital Stay: Payer: Medicare HMO

## 2018-10-20 VITALS — BP 120/84 | HR 78 | Temp 97.7°F | Resp 18 | Ht 73.0 in | Wt 168.4 lb

## 2018-10-20 DIAGNOSIS — M4802 Spinal stenosis, cervical region: Secondary | ICD-10-CM

## 2018-10-20 DIAGNOSIS — R1319 Other dysphagia: Secondary | ICD-10-CM

## 2018-10-20 DIAGNOSIS — R1310 Dysphagia, unspecified: Secondary | ICD-10-CM

## 2018-10-20 DIAGNOSIS — Z760 Encounter for issue of repeat prescription: Secondary | ICD-10-CM

## 2018-10-20 MED ORDER — SUCRALFATE 1 G PO TABS: 1.0000 g | ORAL_TABLET | Freq: Three times a day (TID) | ORAL | 1 refills | Status: DC

## 2018-10-20 NOTE — Progress Notes (Signed)
Pt seen by PA Van only, no RN assessment at this time.  PA aware. 

## 2018-10-23 NOTE — Progress Notes (Signed)
Mr. Lohse presents to the clinic today with a request for a refill of sucralfate and oxycodone.  He also states that he needs to get back to see his neurosurgeon about his chronic neck pain secondary to spinal stenosis.  He has been given pain medications in the past by his neurosurgeon.  He states that he needs to be cleared by our office to release by our office to return to his neurosurgeon.  I gave him a refill of sucralfate but declined a refill of oxycodone given that the last 2 prescriptions have been given to him or after the patient stated that he only needed them for 2 weeks.  I have instructed him to follow-up with Dr. Isidore Moos in order to be cleared to return to see his neurosurgeon.  I explained to him that I did not have the authority to clear him.  Sandi Mealy, MHS, PA-C Physician Assistant

## 2018-10-24 ENCOUNTER — Telehealth: Payer: Self-pay | Admitting: *Deleted

## 2018-10-24 ENCOUNTER — Other Ambulatory Visit: Payer: Self-pay | Admitting: Radiation Oncology

## 2018-10-24 NOTE — Telephone Encounter (Signed)
Called patient's daughter - Kyrus Hyde to ask about scheduling scan and fu for her dad, lvm for a return call

## 2018-10-27 ENCOUNTER — Telehealth: Payer: Self-pay | Admitting: *Deleted

## 2018-10-27 NOTE — Telephone Encounter (Signed)
XXXX 

## 2018-10-27 NOTE — Telephone Encounter (Signed)
CALLED PATIENT'S DAUGHTER- MELANIE Sisney TO INFORM OF LAB AND CT FOR HER DAD ON 10-30-18 AND HIS FU APPT. WITH DR. Isidore Moos ON 11-01-18 FOR RESULTS, LVM FOR A RETURN CALL

## 2018-10-27 NOTE — Telephone Encounter (Signed)
Called patient's uncleHerbie [place] , to ask about informing patient of lab and CT and fu, lvm for a return call

## 2018-10-28 ENCOUNTER — Telehealth: Payer: Self-pay | Admitting: *Deleted

## 2018-10-28 NOTE — Telephone Encounter (Signed)
Oncology Nurse Navigator Documentation  Called pt x2 today to inform/remind him of 3/30 2:00 labs and 3:00 CT Neck, scheduling of 1:30 LYFT pick-up.  Unable to reach or LVMM.  LVMM on dtr and brother's phones with above information, requested call-back to confirm message receipt and contact with Thayer Jew.  Gayleen Orem, RN, BSN Head & Neck Oncology Nurse [place] at [place] [phone]

## 2018-10-30 ENCOUNTER — Ambulatory Visit: Payer: Medicare HMO

## 2018-10-30 ENCOUNTER — Ambulatory Visit (HOSPITAL_COMMUNITY): Admission: RE | Admit: 2018-10-30 | Payer: Medicare HMO | Source: Ambulatory Visit

## 2018-10-30 ENCOUNTER — Telehealth: Payer: Self-pay | Admitting: *Deleted

## 2018-10-30 NOTE — Telephone Encounter (Addendum)
Oncology Nurse Navigator Documentation  Spoke with Artemis's uncle.    He explained he has been unable to reach [facility] over the past days and suspects the time allotment on his phone has been used up.  He indicated Shiquan usually adds additional minutes when he receives his disability check at the 1st of the month.  He indicated he believes [facility] has moved to [place] [employer], "tall white building".  (I later spoke with Gwinda Maine, CSW, who believes facility is called [facility].) I will reach out to [place] later this week when it is expected his phone will be working.  Dr. Isidore Moos provided update.  Gayleen Orem, RN, BSN Head & Neck Oncology Nurse [place] at [place] [phone]

## 2018-11-01 ENCOUNTER — Ambulatory Visit: Payer: Medicare HMO | Admitting: Radiation Oncology

## 2018-11-01 ENCOUNTER — Telehealth: Payer: Self-pay | Admitting: *Deleted

## 2018-11-01 NOTE — Telephone Encounter (Signed)
On 11-01-18 fax medical records to [facility]

## 2018-11-06 ENCOUNTER — Telehealth: Payer: Self-pay | Admitting: *Deleted

## 2018-11-06 NOTE — Telephone Encounter (Signed)
Oncology Nurse Navigator Documentation  Called Mr. Garzon to check on well-being, no answer, unable to leave message.  Gayleen Orem, RN, BSN Head & Neck Oncology Nurse [place] at [place] [phone]

## 2018-11-21 IMAGING — CT CT ANGIO CHEST
2 of 7 series · 18 of 46 positions shown · IV contrast (APPLIED)
Comparison: PET-CT 03/31/2018.

CLINICAL DATA: 67-year-old male with difficulty swelling and
shortness of breath. Removal of malignant lesion from the right neck
yesterday complaining of pain at the surgical site.

EXAM:
CT ANGIOGRAPHY CHEST WITH CONTRAST
TECHNIQUE: Multidetector CT imaging of the chest was performed using the
standard protocol during bolus administration of intravenous
contrast. Multiplanar CT image reconstructions and MIPs were
obtained to evaluate the vascular anatomy.
CONTRAST:  100mL S7OAMV-DW5 IOPAMIDOL (S7OAMV-DW5) INJECTION 76%

[Series 7: thins · axial · 0.70mm/px · z∈[-307,-67]mm · 15 of 387 slices shown]
[im 22/387  lung]
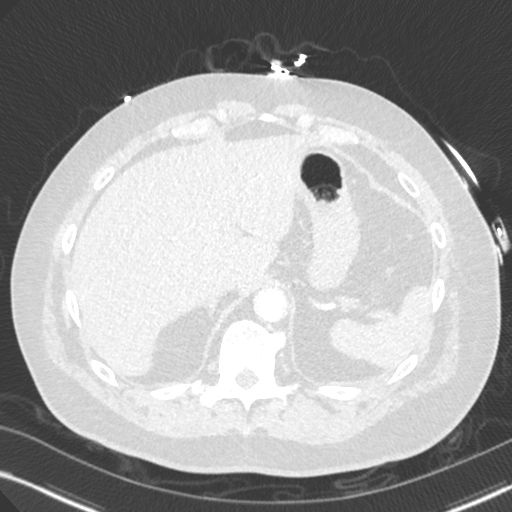
[im 43/387  soft-tissue]
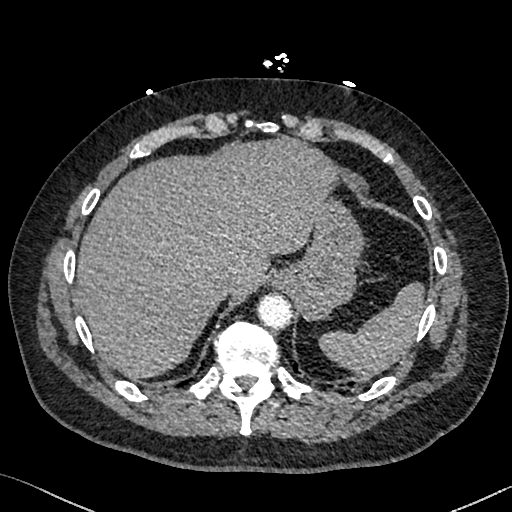
[im 65/387  lung]
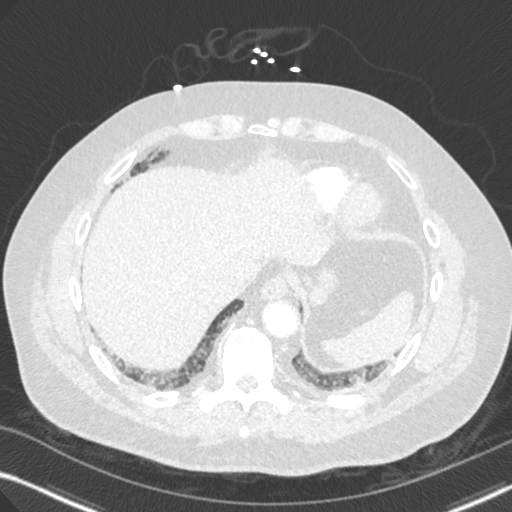
[im 86/387  soft-tissue]
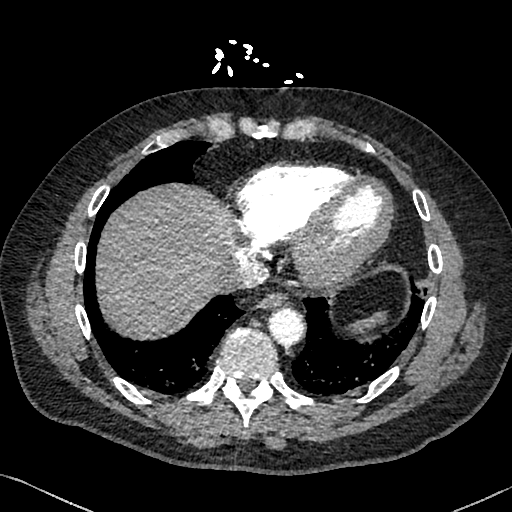
[im 129/387  lung]
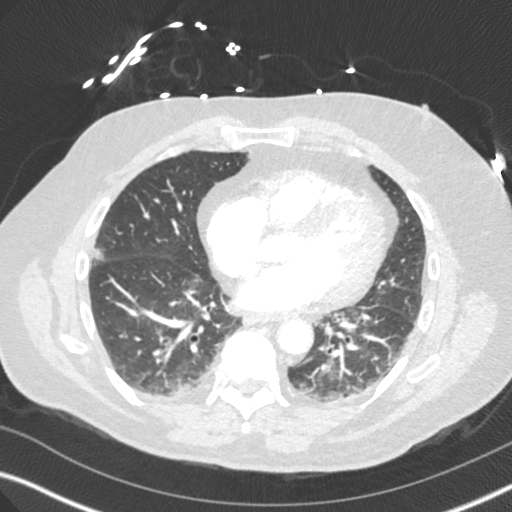
[im 151/387  soft-tissue]
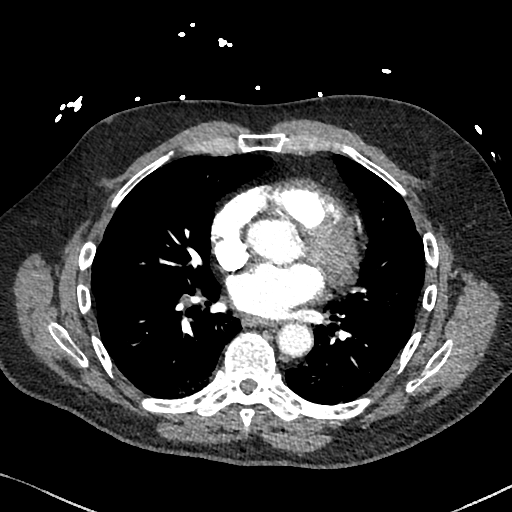
[im 172/387  lung]
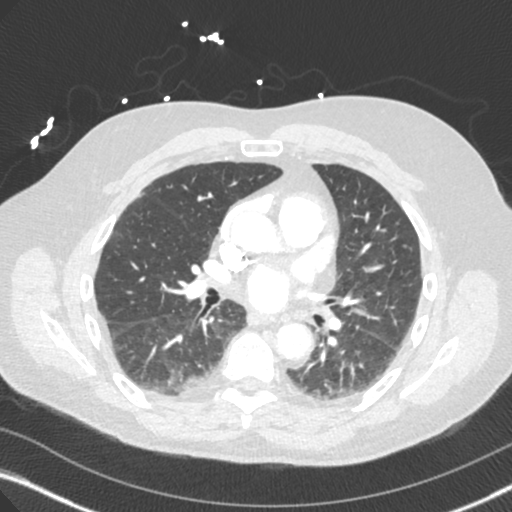
[im 194/387  soft-tissue]
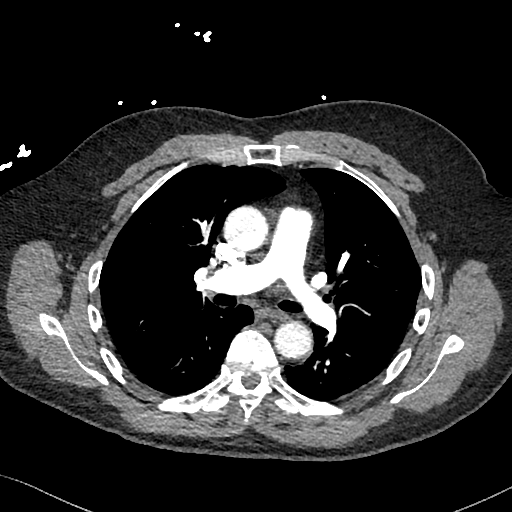
[im 215/387  lung]
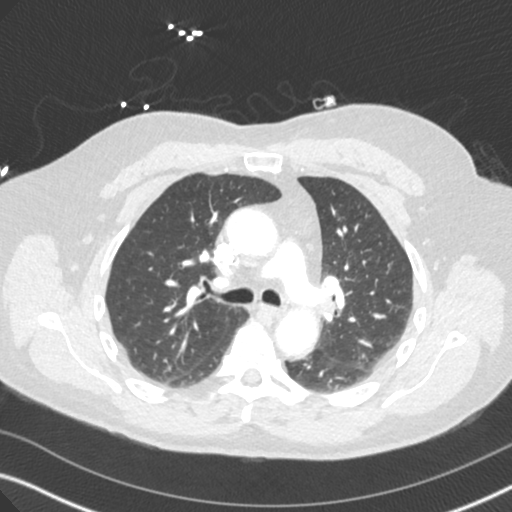
[im 236/387  soft-tissue]
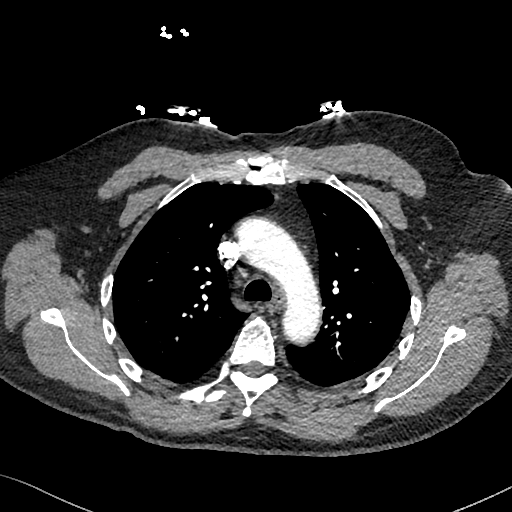
[im 258/387  lung]
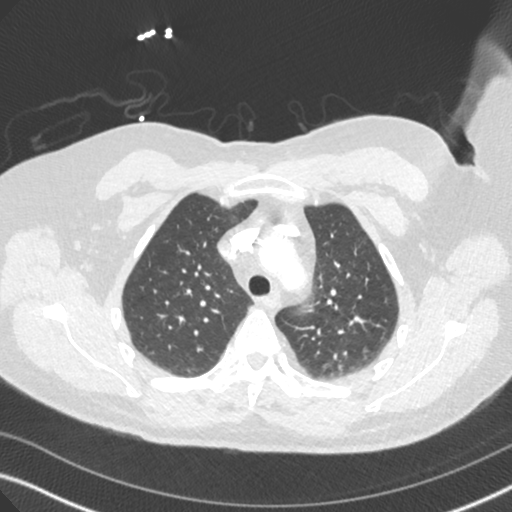
[im 301/387  soft-tissue]
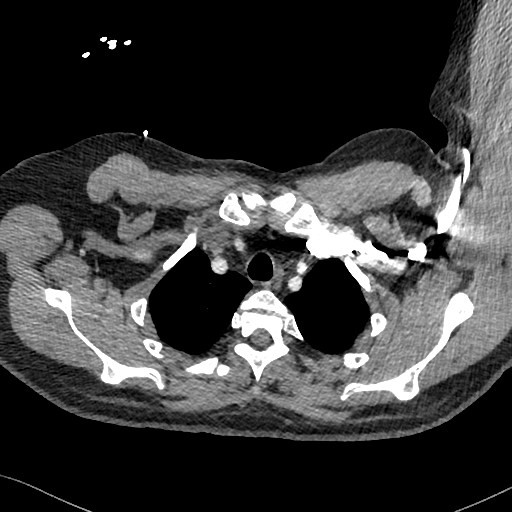
[im 322/387  lung]
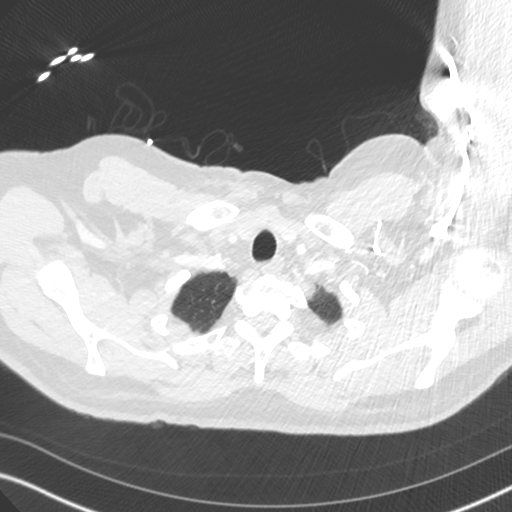
[im 344/387  soft-tissue]
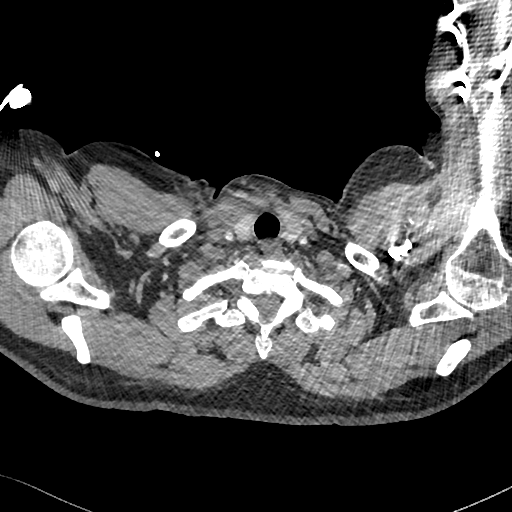
[im 365/387  lung]
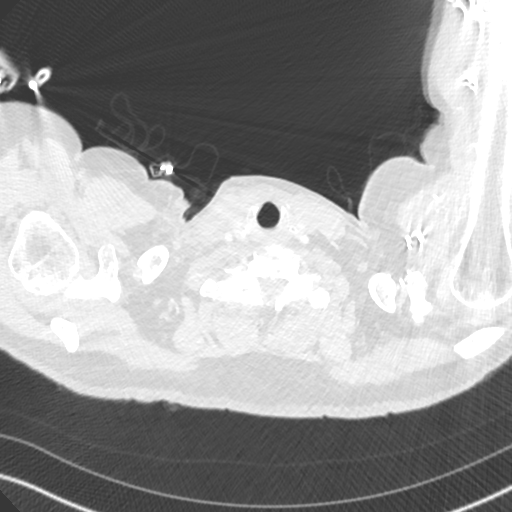

[Series 8: cor · coronal · 0.54mm/px · 3 of 148 slices shown]
[im 37/148  soft-tissue]
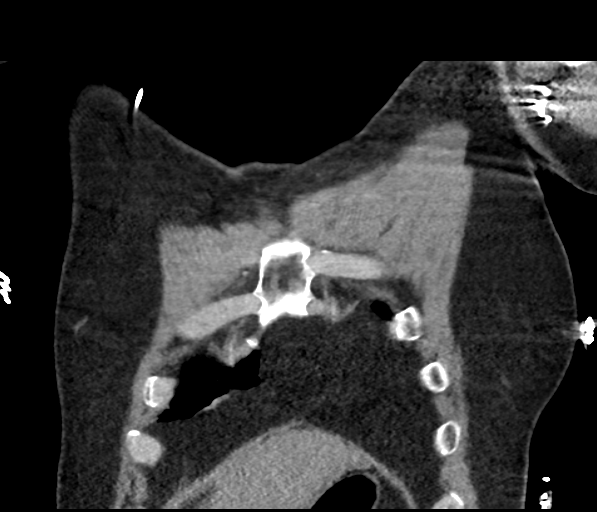
[im 74/148  soft-tissue]
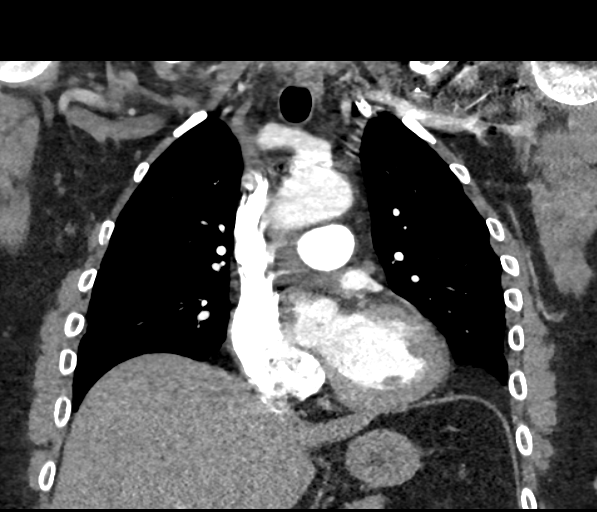
[im 111/148  soft-tissue]
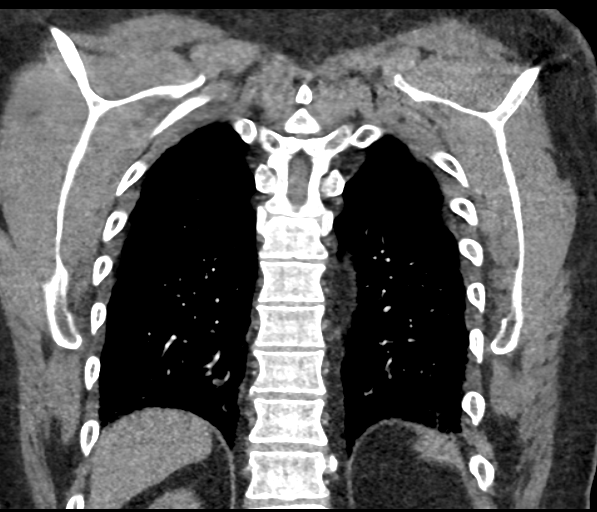

[18 of 46 positions shown; findings below may reference images not displayed]

FINDINGS: Cardiovascular: No filling defects in the pulmonary arterial tree to
suggest underlying pulmonary embolism. Heart size is normal. There
is no significant pericardial fluid, thickening or pericardial
calcification. Aortic atherosclerosis. No definite coronary artery
calcifications.

Mediastinum/Nodes: No pathologically enlarged mediastinal or hilar
lymph nodes. Esophagus is unremarkable in appearance. No axillary
lymphadenopathy.

Lungs/Pleura: Patchy ill-defined areas of ground-glass attenuation
and septal thickening noted in the dependent portions of the lower
lobes of the lungs bilaterally, likely to reflect areas of mild
subsegmental atelectasis. No confluent consolidative airspace
disease. No pleural effusions. Mild linear scarring in the lateral
segment of the right middle lobe.

Upper Abdomen: Unremarkable.

Musculoskeletal: There are no aggressive appearing lytic or blastic
lesions noted in the visualized portions of the skeleton.

Review of the MIP images confirms the above findings.
IMPRESSION: 1. No evidence of pulmonary embolism.
2. No acute findings are noted in the thorax to account for the
patient's symptoms.
3. Aortic atherosclerosis.

Aortic Atherosclerosis (34044-CI8.8).

## 2018-12-01 ENCOUNTER — Encounter: Payer: Self-pay | Admitting: General Practice

## 2018-12-01 NOTE — Progress Notes (Signed)
[facility] Team contacted patient to assess for food insecurity and other psychosocial needs during current COVID19 pandemic.  No answer. No voice mail.   Beverely Pace, [place]

## 2018-12-04 ENCOUNTER — Telehealth: Payer: Self-pay | Admitting: *Deleted

## 2018-12-04 ENCOUNTER — Other Ambulatory Visit: Payer: Self-pay | Admitting: *Deleted

## 2018-12-04 NOTE — Telephone Encounter (Addendum)
Oncology Nurse Navigator Documentation  Received call from Mr. Hessel with new phone number.  He indicated he has had new phone # for about a week, is calling me per my calls to his uncle. I explained the importance of rescheduling cancelled 3/30 CT Neck/Chest and 4/1 Follow-up with Dr. Isidore Moos so he can proceed with tmt/surgery with D. Arnoldo Morale per plan when he started RT.  He voiced understanding. He voiced understanding he will receive a call from Enid Derry, Energy manager, with appts pending insurance re-authorization of scans. He reported:  R ear hearing loss and pain.  He has contacted ENT Dr. Victorio Palm office to schedule appt but has not received call back.  I indicated I will call his office as well.  Several falls over the past 3 weeks d/t incidental L-sided muscle weakness.  He denied weakness is persistent.      Has appointment 6/22 with neurosurgeon Dr. Arnoldo Morale.  Eating at 85-90% pre-tmt baseline, gaining weight.  Occasional dysphagia. I encouraged him to call me with needs/concerns, expressed importance he stay in communication with care team.  He voiced understanding.  Addendum 1721:  LVMM ENT Dr. Victorio Palm office requesting call to Mr. Deterding re reported R ear hearing loss/pain.  Addendum 5/5 1423:  Rec'd VMM from [facility], Dr. Victorio Palm office, she called Mr. Griffith to arrange appt, no answer,  unable to LVMM.  Gayleen Orem, RN, BSN Head & Neck Oncology Nurse [place] at [place] [phone]

## 2018-12-05 ENCOUNTER — Telehealth: Payer: Self-pay | Admitting: *Deleted

## 2018-12-05 NOTE — Telephone Encounter (Signed)
Called patient to inform of CT for 12-07-18 - arrival time- 11:45 am @ [facility] Radiology, patient to have water only - 4 hrs. prior to test, and patient to follow-up with Dr. Isidore Moos on May 8 @ 10 am for results, spoke with patient and he is aware of these appts.

## 2018-12-06 ENCOUNTER — Telehealth: Payer: Self-pay | Admitting: *Deleted

## 2018-12-06 NOTE — Telephone Encounter (Signed)
Oncology Nurse Navigator Documentation  Spoke with [name]:  Confirmed his understanding of tomorrow's 10:45 [place] lab, 12:00 CTs including liquids only 4 hrs prior, and Friday's 10:00 appt with Dr. Isidore Moos.  He voiced understanding, confirmed he had received call from [facility] re rides for these appts.  Informed him I had rec'd VMM from Dr. Victorio Palm office indicating they had called to schedule appt, no answer and unable to LVMM.  I encouraged him to return call, he agreed.  Gayleen Orem, RN, BSN Head & Neck Oncology Nurse [place] at [employer] [phone]

## 2018-12-07 ENCOUNTER — Ambulatory Visit (HOSPITAL_COMMUNITY): Admission: RE | Admit: 2018-12-07 | Payer: Medicare HMO | Source: Ambulatory Visit

## 2018-12-07 ENCOUNTER — Ambulatory Visit: Payer: Medicare HMO

## 2018-12-07 ENCOUNTER — Telehealth: Payer: Self-pay | Admitting: *Deleted

## 2018-12-07 ENCOUNTER — Other Ambulatory Visit: Payer: Self-pay | Admitting: *Deleted

## 2018-12-07 DIAGNOSIS — C770 Secondary and unspecified malignant neoplasm of lymph nodes of head, face and neck: Secondary | ICD-10-CM

## 2018-12-07 NOTE — Telephone Encounter (Signed)
A user error has taken place: encounter opened in error, closed for administrative reasons.

## 2018-12-07 NOTE — Telephone Encounter (Signed)
Called patient to inform of new date for lab and CT on May 11, unable to leave message, I will call him later.

## 2018-12-08 ENCOUNTER — Telehealth: Payer: Self-pay | Admitting: *Deleted

## 2018-12-08 ENCOUNTER — Ambulatory Visit: Payer: Medicare HMO | Admitting: Radiation Oncology

## 2018-12-08 NOTE — Telephone Encounter (Signed)
CALLED PATIENT TO INFORM THAT LAB AND CT HAVE BEEN MOVED TO 12-11-18 - 1:45 PM FOR STAT LABS AND CT - ARRIVAL TIME - 2:45 PM, PT. TO HAVE WATER ONLY- 4 HRS. PRIOR TO TEST, SPOKE WITH PATIENT AND HE IS AWARE OF THESE APPTS.

## 2018-12-11 ENCOUNTER — Other Ambulatory Visit: Payer: Self-pay

## 2018-12-11 ENCOUNTER — Ambulatory Visit
Admission: RE | Admit: 2018-12-11 | Discharge: 2018-12-11 | Disposition: A | Discharge status: Home / Self Care | Payer: Medicare HMO | Source: Ambulatory Visit | Attending: Radiation Oncology | Admitting: Radiation Oncology

## 2018-12-11 ENCOUNTER — Encounter (HOSPITAL_COMMUNITY): Payer: Self-pay

## 2018-12-11 ENCOUNTER — Ambulatory Visit (HOSPITAL_COMMUNITY)
Admission: RE | Admit: 2018-12-11 | Discharge: 2018-12-11 | Disposition: A | Discharge status: Home / Self Care | Payer: Medicare HMO | Source: Ambulatory Visit | Attending: Radiation Oncology | Admitting: Radiation Oncology

## 2018-12-11 DIAGNOSIS — C770 Secondary and unspecified malignant neoplasm of lymph nodes of head, face and neck: Secondary | ICD-10-CM | POA: Diagnosis present

## 2018-12-11 LAB — BUN & CREATININE (CHCC)
BUN: 10 mg/dL (ref 8–23)
Creatinine: 1.06 mg/dL (ref 0.61–1.24)
GFR, Est AFR Am: >60 mL/min (ref >60)
GFR, Estimated: >60 mL/min (ref >60)

## 2018-12-11 MED ORDER — IOHEXOL 300 MG/ML  SOLN
75.0000 mL | Freq: Once | INTRAMUSCULAR | Status: AC | PRN
  Administered 2018-12-11: 15:00:00 75 mL via INTRAVENOUS

## 2018-12-11 MED ORDER — SODIUM CHLORIDE (PF) 0.9 % IJ SOLN
INTRAMUSCULAR | Status: AC
  Filled 2018-12-11: qty 50

## 2018-12-12 ENCOUNTER — Encounter: Payer: Self-pay | Admitting: *Deleted

## 2018-12-12 ENCOUNTER — Ambulatory Visit
Admission: RE | Admit: 2018-12-12 | Discharge: 2018-12-12 | Disposition: A | Discharge status: Home / Self Care | Payer: Medicare HMO | Source: Ambulatory Visit | Attending: Radiation Oncology | Admitting: Radiation Oncology

## 2018-12-12 DIAGNOSIS — C801 Malignant (primary) neoplasm, unspecified: Secondary | ICD-10-CM

## 2018-12-12 DIAGNOSIS — C7989 Secondary malignant neoplasm of other specified sites: Secondary | ICD-10-CM

## 2018-12-12 DIAGNOSIS — C770 Secondary and unspecified malignant neoplasm of lymph nodes of head, face and neck: Secondary | ICD-10-CM

## 2018-12-12 MED ORDER — SODIUM FLUORIDE 1.1 % DT CREA: TOPICAL_CREAM | DENTAL | 99 refills | Status: DC

## 2018-12-12 NOTE — Progress Notes (Signed)
Radiation Oncology         [phone]) [phone] ________________________________  Name: [name] MRN: [id number]  Date: 12/12/2018  DOB: 11-27-1950  Follow-Up Telphone Note (due to pandemic precautions, patient unable to use WebEx)  CC: Eston Esters, NP  Jerrell Belfast, MD  Diagnosis and Prior Radiotherapy:       ICD-10-CM   1. 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  He underwent restaging scans yesterday, 12/11/2018. CT of the neck showed changes related to previous right-sided neck dissection and regional radiation, including supraglottic edema. No evidence of residual or recurrent nodal mass. No primary tumor identifiable. CT of the chest negative for metastatic disease. I reviewed the images myself.  He has followup tomorrow with ENT due to swelling affecting his right ear.  He is waiting for spinal surgery with Dr Arnoldo Morale.  Has been spontaneously falling, without LOC.  No other complaints.  ALLERGIES:  has No Known Allergies.  Meds: Current Outpatient Medications  Medication Sig Dispense Refill  . atorvastatin (LIPITOR) 20 MG tablet Take 20 mg by mouth daily at 6 PM.     . celecoxib (CELEBREX) 200 MG capsule TK 1 C PO Q 12 H PRN    . fentaNYL (DURAGESIC) 25  MCG/HR     . fluconazole (DIFLUCAN) 100 MG tablet HOLD ATORVASTATIN while on this medication. Take 2 tablets today, then 1 tablet daily x 20 more days. (Patient not taking: Reported on 08/25/2018) 22 tablet 0  . gabapentin (NEURONTIN) 300 MG capsule Take 300 mg by mouth 3 (three) times daily.    Marland Kitchen lidocaine (LIDODERM) 5 % Place 1 patch onto the skin daily. Remove & Discard patch within 12 hours or as directed by MD 5 patch 0  . lidocaine (XYLOCAINE) 2 % solution Use as directed 5 mLs in the mouth or throat every 4 (four) hours as needed for mouth pain. Swish and swallow 100 mL 1  . lidocaine (XYLOCAINE) 2 % solution Use as directed 5 mLs in the mouth or throat as needed for mouth pain. 100 mL 3  . lidocaine (XYLOCAINE) 2 % solution Patient: Mix 1part 2% viscous lidocaine, 1part H20. Swish & swallow 41mL of diluted mixture, 39min before meals and at bedtime, up to QID 100 mL 5  . magic mouthwash SOLN Take 5 mLs by mouth 4 (four) times daily as needed for mouth pain. 240 mL 2  . metFORMIN (GLUCOPHAGE) 1000 MG tablet Take 1,000 mg by mouth 2 (two) times daily with a meal.    . Multiple Vitamins-Minerals (MULTIVITAMIN WITH IRON-MINERALS) liquid Take 15 mLs by mouth daily. Drink milk before taking this to protect stomach. 236 mL 3  . ondansetron (ZOFRAN ODT) 4 MG disintegrating tablet Take 1 tablet (4  mg total) by mouth every 8 (eight) hours as needed for nausea or vomiting. Place under tongue 20 tablet 0  . oxyCODONE (OXY IR/ROXICODONE) 5 MG immediate release tablet Take 1 tablet (5 mg total) by mouth every 4 (four) hours as needed for severe pain. 90 tablet 0  . polyethylene glycol-electrolytes (NULYTELY/GOLYTELY) 420 g solution Take 4,000 mLs by mouth as directed. 4000 mL 0  . promethazine (PHENERGAN) 25 MG tablet Take 1 tablet (25 mg total) by mouth every 6 (six) hours as needed for nausea or vomiting. 30 tablet 2  . sertraline (ZOLOFT) 50 MG tablet Take 1 tablet (50 mg total) by mouth daily. 30 tablet 5   . sodium fluoride (PREVIDENT 5000 PLUS) 1.1 % CREA dental cream Apply cream to tooth brush. Brush teeth for 2 minutes. Spit out excess-DO NOT swallow. Repeat nightly. 1 Tube prn  . sucralfate (CARAFATE) 1 g tablet Dissolve 1 tablet in 10 mL H20 and swallow 30 min prior to meals and bedtime. 40 tablet 5  . sucralfate (CARAFATE) 1 g tablet Take 1 tablet (1 g total) by mouth 4 (four) times daily -  with meals and at bedtime. Dissolve in 2 ounces of water then swallow 120 tablet 1  . tamsulosin (FLOMAX) 0.4 MG CAPS capsule Take 1 capsule (0.4 mg total) by mouth daily. 14 capsule 0   No current facility-administered medications for this encounter.    Facility-Administered Medications Ordered in Other Encounters  Medication Dose Route Frequency Provider Last Rate Last Dose  . 0.9 %  sodium chloride infusion   Intravenous Once Harle Stanford., PA-C      . ondansetron [facility]) injection 4 mg  4 mg Intravenous Once Harle Stanford., PA-C        Physical Findings: The patient is in no acute distress. Patient is alert and oriented. Wt Readings from Last 3 Encounters:  10/20/18 168 lb 6.4 oz (76.4 kg)  09/13/18 172 lb 1.6 oz (78.1 kg)  09/12/18 174 lb (78.9 kg)    vitals were not taken for this visit. .     Lab Findings: Lab Results  Component Value Date   WBC 5.1 08/25/2018   HGB 16.4 08/25/2018   HCT 48.9 08/25/2018   MCV 89.1 08/25/2018   PLT 190 08/25/2018    Lab Results  Component Value Date   TSH 0.763 08/25/2018    Radiographic Findings: Ct Soft Tissue Neck W Contrast  Result Date: 12/11/2018 CLINICAL DATA:  History node cancer treated with surgery and radiation. Dysphagia. EXAM: CT NECK WITH CONTRAST TECHNIQUE: Multidetector CT imaging of the neck was performed using the standard protocol following the bolus administration of intravenous contrast. CONTRAST:  67mL OMNIPAQUE IOHEXOL 300 MG/ML  SOLN COMPARISON:  CT 02/21/2018.  PET scan 03/31/2018. FINDINGS: Pharynx and larynx: No  visible mucosal or submucosal mass lesion. Mild edema in the supraglottic region consistent with previous radiation. Salivary glands: Parotid and submandibular glands appear negative. Submandibular glands enhance, consistent with previous radiation. Thyroid: Normal Lymph nodes: No identifiable enlarged or low-density lymph nodes. Previous resection of the right level 2 nodal mass. Scarring in that region. Vascular: Intimal thickening of the right common carotid artery and carotid bifurcation region consistent with previous radiation. Limited intracranial: Normal Visualized orbits: Normal Mastoids and visualized paranasal sinuses: Clear sinuses. Old right maxillary fracture. Mastoid effusion on the right. Skeleton: Old right maxillary fracture as noted above. Previous ACDF C4-5. Degenerative retrolisthesis C3-4 with spinal stenosis that could be significant. This was evaluated by  MRI in January. Upper chest: See results of chest CT. Other: None IMPRESSION: Changes related to previous right-sided neck dissection and regional radiation. No evidence of residual or recurrent nodal mass. No primary tumor identifiable. Radiation changes including supraglottic edema. Spinal stenosis at C3-4 as shown by previous MRI. NI-RADS 1 by CT Electronically Signed   By: Nelson Chimes M.D.   On: 12/11/2018 15:50   Ct Chest W Contrast  Result Date: 12/12/2018 CLINICAL DATA:  Head and neck cancer with right neck lymph node metastasis treated with radiation therapy completed 09/01/2018. Restaging. EXAM: CT CHEST WITH CONTRAST TECHNIQUE: Multidetector CT imaging of the chest was performed during intravenous contrast administration. CONTRAST:  23mL OMNIPAQUE IOHEXOL 300 MG/ML  SOLN COMPARISON:  06/06/2018 chest CT angiogram.  03/31/2018 PET-CT. FINDINGS: Cardiovascular: Normal heart size. No significant pericardial effusion/thickening. Atherosclerotic nonaneurysmal thoracic aorta. Normal caliber pulmonary arteries. No central pulmonary  emboli. Mediastinum/Nodes: No discrete thyroid nodules. Unremarkable esophagus. No pathologically enlarged axillary, mediastinal or hilar lymph nodes. Lungs/Pleura: No pneumothorax. No pleural effusion. No acute consolidative airspace disease or lung masses. Small subpleural nodules measuring 4 mm in the right middle lobe (series 7/image 120) and 5 mm in the left lower lobe (series 7/image 126) are both stable since 03/27/2010 CT abdomen study, considered benign. No new significant pulmonary nodules. Upper abdomen: Nonobstructing 4 mm upper left renal stone. Musculoskeletal: No aggressive appearing focal osseous lesions. Minimal thoracic spondylosis. Partially visualized surgical hardware from ACDF in the cervical spine. IMPRESSION: 1. No evidence of metastatic disease in the chest. 2. Nonobstructing left nephrolithiasis. Aortic Atherosclerosis (ICD10-I70.0). Electronically Signed   By: Ilona Sorrel M.D.   On: 12/12/2018 08:38    Impression/Plan:  Secondary Head and Neck Cancer  1) Head and Neck Cancer Status: NED per scans  2) Nutritional Status: continue to push intake as tolerated  3) Risk Factors: The patient has been educated about risk factors including alcohol and tobacco abuse; they understand that avoidance of alcohol and tobacco is important to prevent recurrences as well as other cancers. He has a history of drug abuse.  4) Swallowing: functional  5) Dental: Encouraged to continue regular followup with dentistry, and dental hygiene including fluoride rinses. Refilled Prevident 5000 today and reminded him of importance of using this nightly  6) Thyroid function:  Recheck annually Lab Results  Component Value Date   TSH 0.763 08/25/2018    7) Other: Pt instructed to followup with ENT as scheduled and again later this summer for surveillance. I'll see him again in October.  I called Dr Arnoldo Morale and let him know pt is in remission and has been falling, consider spinal surgery soon.   The  patient was encouraged to call with any issues or questions before then. ___________  This encounter was provided by phone as he couldn't use platform Webex.  The time spent during this encounter was OVER 15 minutes. The attendants for this meeting include Eppie Gibson  and Merry Proud. Also Gayleen Orem, RN, our Head and Neck Oncology Navigator  During the encounter, Eppie Gibson was located at [facility] Radiation Oncology Department.  [name] was located at home.  __________________________   Eppie Gibson, MD  This document serves as a record of services personally performed by Eppie Gibson, MD. It was created on her behalf by Rae Lips, a trained medical scribe. The creation of this record is based on the scribe's personal observations and the provider's statements to them. This document has been  checked and approved by the attending provider.

## 2018-12-13 NOTE — Progress Notes (Signed)
Oncology Nurse Navigator Documentation  Joined Dr. Isidore Moos during post-tmt conference call with Mr. Stockley. He voiced understanding yesterday's CT Neck/Chest indicated NED. He reported:  Has appt with Dr. Wilburn Cornelia tomorrow, 5/13, for evaluation of  R ear pain, R facial numbness which continues s/p surgery.  Has appt with [facility] in 2 months. He voiced understanding he should make appt with Dr. Wilburn Cornelia for late summer, will see Dr. Isidore Moos in fall. I encouraged him to call me with needs/concerns.  Gayleen Orem, RN, BSN Head & Neck Oncology Nurse [place] [facility] [place] [phone]

## 2018-12-15 ENCOUNTER — Encounter: Payer: Self-pay | Admitting: Radiation Oncology

## 2018-12-15 ENCOUNTER — Other Ambulatory Visit: Payer: Self-pay | Admitting: Radiation Oncology

## 2018-12-15 DIAGNOSIS — Z1329 Encounter for screening for other suspected endocrine disorder: Secondary | ICD-10-CM

## 2018-12-15 DIAGNOSIS — R634 Abnormal weight loss: Secondary | ICD-10-CM

## 2018-12-21 ENCOUNTER — Ambulatory Visit (HOSPITAL_COMMUNITY): Payer: Medicare HMO

## 2018-12-22 ENCOUNTER — Ambulatory Visit: Payer: Self-pay | Admitting: Radiation Oncology

## 2018-12-26 ENCOUNTER — Other Ambulatory Visit: Payer: Self-pay | Admitting: Neurosurgery

## 2018-12-27 ENCOUNTER — Other Ambulatory Visit (HOSPITAL_COMMUNITY)
Admission: RE | Admit: 2018-12-27 | Discharge: 2018-12-27 | Disposition: A | Discharge status: Home / Self Care | Payer: Medicare HMO | Source: Ambulatory Visit | Attending: Neurosurgery | Admitting: Neurosurgery

## 2018-12-27 ENCOUNTER — Other Ambulatory Visit: Payer: Self-pay | Admitting: Neurosurgery

## 2018-12-27 ENCOUNTER — Other Ambulatory Visit: Payer: Self-pay

## 2018-12-27 ENCOUNTER — Encounter (HOSPITAL_COMMUNITY): Payer: Self-pay | Admitting: *Deleted

## 2018-12-27 DIAGNOSIS — Z1159 Encounter for screening for other viral diseases: Secondary | ICD-10-CM | POA: Insufficient documentation

## 2018-12-27 LAB — SARS CORONAVIRUS 2 BY RT PCR (HOSPITAL ORDER, PERFORMED IN ~~LOC~~ HOSPITAL LAB): SARS Coronavirus 2: NEGATIVE

## 2018-12-27 NOTE — Progress Notes (Signed)
Pt denies SOB, chest pain, and being under the care of a cardiologist. Pt denies having a stress test, echo and cardiac cath. Pt denies having a chest x ray within the last year. Pt denies recent labs. Pt made aware to stop taking Aspirin (unless otherwise advised by surgeon), vitamins, fish oil and herbal medications. Do not take any NSAIDs ie: Ibuprofen, Advil, Naproxen (Aleve), Motrin, BC and Goody Powder. Pt made aware to not take Metformin on DOS. Pt stated that he does not check his blood glucose (BG). Pt stated that he has not tested positive for COVID- 19 (tested on 12/27/2018).  Pt denies that he experienced the following symptoms:  Cough yes/no: No Fever (>100.69F)  yes/no: No Runny nose yes/no: No Sore throat yes/no: No Difficulty breathing/shortness of breath  yes/no: No  Have you or a family member traveled in the last 14 days and where? yes/no: No  Pt reminded that hospital visitation restrictions are in effect and the importance of the restrictions.  Pt verbalized understanding of all pre-op instructions.

## 2018-12-27 NOTE — Anesthesia Preprocedure Evaluation (Addendum)
Anesthesia Evaluation  Patient identified by MRN, date of birth, ID band Patient awake    Reviewed: Allergy & Precautions, NPO status , Patient's Chart, lab work & pertinent test results  Airway Mallampati: I  TM Distance: >3 FB Neck ROM: Full    Dental  (+) Partial Upper, Partial Lower   Pulmonary neg pulmonary ROS,    Pulmonary exam normal breath sounds clear to auscultation       Cardiovascular negative cardio ROS Normal cardiovascular exam Rhythm:Regular Rate:Normal  ECG: ST, rate 101   Neuro/Psych PSYCHIATRIC DISORDERS Depression  Neuromuscular disease    GI/Hepatic negative GI ROS, (+)     substance abuse  , Hepatitis -, C  Endo/Other  diabetes, Oral Hypoglycemic Agents  Renal/GU negative Renal ROS     Musculoskeletal negative musculoskeletal ROS (+)   Abdominal   Peds  Hematology negative hematology ROS (+)   Anesthesia Other Findings CERVICAL SPONDYLOSIS WITH MYELOPATHY  Reproductive/Obstetrics                            Anesthesia Physical Anesthesia Plan  ASA: III  Anesthesia Plan: General   Post-op Pain Management:    Induction: Intravenous  PONV Risk Score and Plan: 3 and Midazolam, Dexamethasone, Ondansetron and Treatment may vary due to age or medical condition  Airway Management Planned: Oral ETT and Video Laryngoscope Planned  Additional Equipment:   Intra-op Plan:   Post-operative Plan: Extubation in OR  Informed Consent: I have reviewed the patients History and Physical, chart, labs and discussed the procedure including the risks, benefits and alternatives for the proposed anesthesia with the patient or authorized representative who has indicated his/her understanding and acceptance.     Dental advisory given  Plan Discussed with: CRNA  Anesthesia Plan Comments:        Anesthesia Quick Evaluation

## 2018-12-28 ENCOUNTER — Other Ambulatory Visit: Payer: Self-pay

## 2018-12-28 ENCOUNTER — Encounter (HOSPITAL_COMMUNITY): Payer: Self-pay

## 2018-12-28 ENCOUNTER — Encounter (HOSPITAL_COMMUNITY)
Admission: AD | Disposition: A | Discharge status: Home / Self Care | Payer: Self-pay | Source: Home / Self Care | Attending: Neurosurgery

## 2018-12-28 ENCOUNTER — Ambulatory Visit (HOSPITAL_COMMUNITY): Payer: Medicare HMO

## 2018-12-28 ENCOUNTER — Inpatient Hospital Stay (HOSPITAL_COMMUNITY)
Admission: AD | Admit: 2018-12-28 | Discharge: 2018-12-29 | DRG: 472 | Disposition: A | Discharge status: Home / Self Care | Payer: Medicare HMO | Attending: Neurosurgery | Admitting: Neurosurgery

## 2018-12-28 ENCOUNTER — Ambulatory Visit (HOSPITAL_COMMUNITY): Payer: Medicare HMO | Admitting: Anesthesiology

## 2018-12-28 DIAGNOSIS — Z923 Personal history of irradiation: Secondary | ICD-10-CM

## 2018-12-28 DIAGNOSIS — F329 Major depressive disorder, single episode, unspecified: Secondary | ICD-10-CM | POA: Diagnosis present

## 2018-12-28 DIAGNOSIS — Z419 Encounter for procedure for purposes other than remedying health state, unspecified: Secondary | ICD-10-CM

## 2018-12-28 DIAGNOSIS — Z79899 Other long term (current) drug therapy: Secondary | ICD-10-CM | POA: Diagnosis not present

## 2018-12-28 DIAGNOSIS — R296 Repeated falls: Secondary | ICD-10-CM | POA: Diagnosis present

## 2018-12-28 DIAGNOSIS — M4722 Other spondylosis with radiculopathy, cervical region: Secondary | ICD-10-CM | POA: Diagnosis present

## 2018-12-28 DIAGNOSIS — G8929 Other chronic pain: Secondary | ICD-10-CM | POA: Diagnosis present

## 2018-12-28 DIAGNOSIS — M4802 Spinal stenosis, cervical region: Secondary | ICD-10-CM | POA: Diagnosis present

## 2018-12-28 DIAGNOSIS — M5011 Cervical disc disorder with radiculopathy,  high cervical region: Secondary | ICD-10-CM | POA: Diagnosis present

## 2018-12-28 DIAGNOSIS — Z981 Arthrodesis status: Secondary | ICD-10-CM

## 2018-12-28 DIAGNOSIS — M4712 Other spondylosis with myelopathy, cervical region: Secondary | ICD-10-CM | POA: Diagnosis present

## 2018-12-28 DIAGNOSIS — E119 Type 2 diabetes mellitus without complications: Secondary | ICD-10-CM | POA: Diagnosis present

## 2018-12-28 DIAGNOSIS — Z7984 Long term (current) use of oral hypoglycemic drugs: Secondary | ICD-10-CM | POA: Diagnosis not present

## 2018-12-28 DIAGNOSIS — K219 Gastro-esophageal reflux disease without esophagitis: Secondary | ICD-10-CM | POA: Diagnosis present

## 2018-12-28 DIAGNOSIS — B182 Chronic viral hepatitis C: Secondary | ICD-10-CM | POA: Diagnosis present

## 2018-12-28 DIAGNOSIS — M47896 Other spondylosis, lumbar region: Secondary | ICD-10-CM | POA: Diagnosis present

## 2018-12-28 DIAGNOSIS — C140 Malignant neoplasm of pharynx, unspecified: Secondary | ICD-10-CM | POA: Diagnosis present

## 2018-12-28 DIAGNOSIS — Z79891 Long term (current) use of opiate analgesic: Secondary | ICD-10-CM

## 2018-12-28 DIAGNOSIS — Z791 Long term (current) use of non-steroidal anti-inflammatories (NSAID): Secondary | ICD-10-CM | POA: Diagnosis not present

## 2018-12-28 DIAGNOSIS — M5001 Cervical disc disorder with myelopathy,  high cervical region: Secondary | ICD-10-CM | POA: Diagnosis present

## 2018-12-28 DIAGNOSIS — Z800 Family history of malignant neoplasm of digestive organs: Secondary | ICD-10-CM | POA: Diagnosis not present

## 2018-12-28 HISTORY — DX: Presence of dental prosthetic device (complete) (partial): Z97.2

## 2018-12-28 HISTORY — DX: Other spondylosis with myelopathy, cervical region: M47.12

## 2018-12-28 HISTORY — DX: Presence of spectacles and contact lenses: Z97.3

## 2018-12-28 HISTORY — PX: ANTERIOR CERVICAL DECOMP/DISCECTOMY FUSION: SHX1161

## 2018-12-28 HISTORY — DX: Complete loss of teeth, unspecified cause, unspecified class: K08.109

## 2018-12-28 LAB — GLUCOSE, CAPILLARY
Glucose-Capillary: 77 mg/dL (ref 70–99)
Glucose-Capillary: 114 mg/dL — ABNORMAL HIGH (ref 70–99)
Glucose-Capillary: 135 mg/dL — ABNORMAL HIGH (ref 70–99)
Glucose-Capillary: 124 mg/dL — ABNORMAL HIGH (ref 70–99)

## 2018-12-28 LAB — COMPREHENSIVE METABOLIC PANEL
ALT: 14 U/L (ref 0–44)
AST: 23 U/L (ref 15–41)
Albumin: 3.8 g/dL (ref 3.5–5.0)
Alkaline Phosphatase: 53 U/L (ref 38–126)
Anion gap: 9 (ref 5–15)
BUN: 9 mg/dL (ref 8–23)
CO2: 27 mmol/L (ref 22–32)
Calcium: 9.5 mg/dL (ref 8.9–10.3)
Chloride: 102 mmol/L (ref 98–111)
Creatinine, Ser: 0.92 mg/dL (ref 0.61–1.24)
GFR calc Af Amer: >60 mL/min (ref >60)
GFR calc non Af Amer: >60 mL/min (ref >60)
Glucose, Bld: 65 mg/dL — ABNORMAL LOW (ref 70–99)
Potassium: 3.8 mmol/L (ref 3.5–5.1)
Sodium: 138 mmol/L (ref 135–145)
Total Bilirubin: 0.5 mg/dL (ref 0.3–1.2)
Total Protein: 6.6 g/dL (ref 6.5–8.1)

## 2018-12-28 LAB — CBC
HCT: 43.4 % (ref 39.0–52.0)
Hemoglobin: 14.7 g/dL (ref 13.0–17.0)
MCH: 32.2 pg (ref 26.0–34.0)
MCHC: 33.9 g/dL (ref 30.0–36.0)
MCV: 95.2 fL (ref 80.0–100.0)
Platelets: 223 10*3/uL (ref 150–400)
RBC: 4.56 MIL/uL (ref 4.22–5.81)
RDW: 12.6 % (ref 11.5–15.5)
WBC: 4.2 10*3/uL (ref 4.0–10.5)
nRBC: 0 % (ref 0.0–0.2)

## 2018-12-28 LAB — HEMOGLOBIN A1C
Hgb A1c MFr Bld: 5.2 % (ref 4.8–5.6)
Mean Plasma Glucose: 102.54 mg/dL

## 2018-12-28 LAB — TYPE AND SCREEN
ABO/RH(D): A POS
Antibody Screen: NEGATIVE

## 2018-12-28 SURGERY — ANTERIOR CERVICAL DECOMPRESSION/DISCECTOMY FUSION 1 LEVEL
Anesthesia: General

## 2018-12-28 MED ORDER — DOCUSATE SODIUM 100 MG PO CAPS
100.0000 mg | ORAL_CAPSULE | Freq: Two times a day (BID) | ORAL | Status: DC
  Administered 2018-12-28 – 2018-12-29 (×2): 100 mg via ORAL
  Filled 2018-12-28 (×2): qty 1

## 2018-12-28 MED ORDER — DEXAMETHASONE SODIUM PHOSPHATE 10 MG/ML IJ SOLN
INTRAMUSCULAR | Status: DC | PRN
  Administered 2018-12-28: 10 mg via INTRAVENOUS

## 2018-12-28 MED ORDER — FENTANYL CITRATE (PF) 250 MCG/5ML IJ SOLN
INTRAMUSCULAR | Status: AC
  Filled 2018-12-28: qty 5

## 2018-12-28 MED ORDER — ACETAMINOPHEN 500 MG PO TABS
1000.0000 mg | ORAL_TABLET | Freq: Once | ORAL | Status: AC
  Administered 2018-12-28: 1000 mg via ORAL
  Filled 2018-12-28: qty 2

## 2018-12-28 MED ORDER — INSULIN ASPART 100 UNIT/ML ~~LOC~~ SOLN: 0.0000 [IU] | SUBCUTANEOUS | Status: DC

## 2018-12-28 MED ORDER — ONDANSETRON HCL 4 MG/2ML IJ SOLN: 4.0000 mg | Freq: Four times a day (QID) | INTRAMUSCULAR | Status: DC | PRN

## 2018-12-28 MED ORDER — CEFAZOLIN SODIUM-DEXTROSE 2-4 GM/100ML-% IV SOLN
2.0000 g | INTRAVENOUS | Status: AC
  Administered 2018-12-28: 08:00:00 2 g via INTRAVENOUS

## 2018-12-28 MED ORDER — CYCLOBENZAPRINE HCL 10 MG PO TABS
10.0000 mg | ORAL_TABLET | Freq: Three times a day (TID) | ORAL | Status: DC | PRN
  Administered 2018-12-28: 10 mg via ORAL
  Filled 2018-12-28: qty 1

## 2018-12-28 MED ORDER — PHENOL 1.4 % MT LIQD: 1.0000 | OROMUCOSAL | Status: DC | PRN

## 2018-12-28 MED ORDER — SODIUM CHLORIDE 0.9 % IV SOLN
INTRAVENOUS | Status: DC | PRN
  Administered 2018-12-28: 08:00:00 50 ug/min via INTRAVENOUS

## 2018-12-28 MED ORDER — ONDANSETRON HCL 4 MG PO TABS: 4.0000 mg | ORAL_TABLET | Freq: Four times a day (QID) | ORAL | Status: DC | PRN

## 2018-12-28 MED ORDER — METFORMIN HCL ER 500 MG PO TB24
1000.0000 mg | ORAL_TABLET | Freq: Two times a day (BID) | ORAL | Status: DC
  Administered 2018-12-28 – 2018-12-29 (×2): 1000 mg via ORAL
  Filled 2018-12-28 (×2): qty 2

## 2018-12-28 MED ORDER — LABETALOL HCL 5 MG/ML IV SOLN
INTRAVENOUS | Status: DC | PRN
  Administered 2018-12-28: 5 mg via INTRAVENOUS

## 2018-12-28 MED ORDER — OXYCODONE HCL 5 MG PO TABS: 5.0000 mg | ORAL_TABLET | Freq: Once | ORAL | Status: DC | PRN

## 2018-12-28 MED ORDER — LIDOCAINE 2% (20 MG/ML) 5 ML SYRINGE
INTRAMUSCULAR | Status: DC | PRN
  Administered 2018-12-28: 60 mg via INTRAVENOUS

## 2018-12-28 MED ORDER — ACETAMINOPHEN 325 MG PO TABS: 650.0000 mg | ORAL_TABLET | ORAL | Status: DC | PRN

## 2018-12-28 MED ORDER — SUCCINYLCHOLINE CHLORIDE 200 MG/10ML IV SOSY
PREFILLED_SYRINGE | INTRAVENOUS | Status: DC | PRN
  Administered 2018-12-28: 100 mg via INTRAVENOUS

## 2018-12-28 MED ORDER — THROMBIN 5000 UNITS EX SOLR
OROMUCOSAL | Status: DC | PRN
  Administered 2018-12-28: 07:00:00 via TOPICAL

## 2018-12-28 MED ORDER — FENTANYL CITRATE (PF) 250 MCG/5ML IJ SOLN
INTRAMUSCULAR | Status: DC | PRN
  Administered 2018-12-28: 50 ug via INTRAVENOUS
  Administered 2018-12-28: 100 ug via INTRAVENOUS
  Administered 2018-12-28 (×9): 50 ug via INTRAVENOUS

## 2018-12-28 MED ORDER — MIDAZOLAM HCL 2 MG/2ML IJ SOLN
INTRAMUSCULAR | Status: DC | PRN
  Administered 2018-12-28: 2 mg via INTRAVENOUS

## 2018-12-28 MED ORDER — HYDROMORPHONE HCL 1 MG/ML IJ SOLN: 0.2500 mg | INTRAMUSCULAR | Status: DC | PRN

## 2018-12-28 MED ORDER — OXYCODONE HCL 5 MG/5ML PO SOLN: 5.0000 mg | Freq: Once | ORAL | Status: DC | PRN

## 2018-12-28 MED ORDER — THROMBIN 5000 UNITS EX SOLR
CUTANEOUS | Status: AC
  Filled 2018-12-28: qty 5000

## 2018-12-28 MED ORDER — BACITRACIN ZINC 500 UNIT/GM EX OINT
TOPICAL_OINTMENT | CUTANEOUS | Status: AC
  Filled 2018-12-28: qty 28.35

## 2018-12-28 MED ORDER — PANTOPRAZOLE SODIUM 40 MG IV SOLR
40.0000 mg | Freq: Every day | INTRAVENOUS | Status: DC
  Administered 2018-12-28: 22:00:00 40 mg via INTRAVENOUS
  Filled 2018-12-28: qty 40

## 2018-12-28 MED ORDER — BISACODYL 10 MG RE SUPP: 10.0000 mg | Freq: Every day | RECTAL | Status: DC | PRN

## 2018-12-28 MED ORDER — PROPOFOL 10 MG/ML IV BOLUS
INTRAVENOUS | Status: DC | PRN
  Administered 2018-12-28: 150 mg via INTRAVENOUS

## 2018-12-28 MED ORDER — CEFAZOLIN SODIUM-DEXTROSE 2-4 GM/100ML-% IV SOLN
INTRAVENOUS | Status: AC
  Filled 2018-12-28: qty 100

## 2018-12-28 MED ORDER — DEXAMETHASONE 4 MG PO TABS
4.0000 mg | ORAL_TABLET | Freq: Four times a day (QID) | ORAL | Status: AC
  Administered 2018-12-28 (×2): 4 mg via ORAL
  Filled 2018-12-28 (×2): qty 1

## 2018-12-28 MED ORDER — BACITRACIN ZINC 500 UNIT/GM EX OINT
TOPICAL_OINTMENT | CUTANEOUS | Status: DC | PRN
  Administered 2018-12-28: 1 via TOPICAL

## 2018-12-28 MED ORDER — 0.9 % SODIUM CHLORIDE (POUR BTL) OPTIME
TOPICAL | Status: DC | PRN
  Administered 2018-12-28: 1000 mL

## 2018-12-28 MED ORDER — ROCURONIUM BROMIDE 10 MG/ML (PF) SYRINGE
PREFILLED_SYRINGE | INTRAVENOUS | Status: DC | PRN
  Administered 2018-12-28 (×2): 20 mg via INTRAVENOUS
  Administered 2018-12-28: 50 mg via INTRAVENOUS

## 2018-12-28 MED ORDER — PROPOFOL 10 MG/ML IV BOLUS
INTRAVENOUS | Status: AC
  Filled 2018-12-28: qty 20

## 2018-12-28 MED ORDER — GABAPENTIN 300 MG PO CAPS
300.0000 mg | ORAL_CAPSULE | Freq: Three times a day (TID) | ORAL | Status: DC
  Administered 2018-12-28 – 2018-12-29 (×3): 300 mg via ORAL
  Filled 2018-12-28 (×3): qty 1

## 2018-12-28 MED ORDER — MORPHINE SULFATE (PF) 4 MG/ML IV SOLN
4.0000 mg | INTRAVENOUS | Status: DC | PRN
  Administered 2018-12-29: 4 mg via INTRAVENOUS
  Filled 2018-12-28: qty 1

## 2018-12-28 MED ORDER — LACTATED RINGERS IV SOLN
INTRAVENOUS | Status: DC
  Administered 2018-12-28: 13:00:00 via INTRAVENOUS

## 2018-12-28 MED ORDER — MIDAZOLAM HCL 2 MG/2ML IJ SOLN
INTRAMUSCULAR | Status: AC
  Filled 2018-12-28: qty 2

## 2018-12-28 MED ORDER — MENTHOL 3 MG MT LOZG: 1.0000 | LOZENGE | OROMUCOSAL | Status: DC | PRN

## 2018-12-28 MED ORDER — CHLORHEXIDINE GLUCONATE CLOTH 2 % EX PADS: 6.0000 | MEDICATED_PAD | Freq: Once | CUTANEOUS | Status: DC

## 2018-12-28 MED ORDER — KETAMINE HCL 50 MG/ML IJ SOLN
INTRAMUSCULAR | Status: DC | PRN
  Administered 2018-12-28 (×2): 10 mg via INTRAMUSCULAR
  Administered 2018-12-28: 30 mg via INTRAMUSCULAR

## 2018-12-28 MED ORDER — ACETAMINOPHEN 500 MG PO TABS
1000.0000 mg | ORAL_TABLET | Freq: Four times a day (QID) | ORAL | Status: AC
  Administered 2018-12-28 – 2018-12-29 (×4): 1000 mg via ORAL
  Filled 2018-12-28 (×4): qty 2

## 2018-12-28 MED ORDER — LACTATED RINGERS IV SOLN
INTRAVENOUS | Status: DC | PRN
  Administered 2018-12-28 (×2): via INTRAVENOUS

## 2018-12-28 MED ORDER — INSULIN ASPART 100 UNIT/ML ~~LOC~~ SOLN
0.0000 [IU] | Freq: Three times a day (TID) | SUBCUTANEOUS | Status: DC
  Administered 2018-12-28: 3 [IU] via SUBCUTANEOUS

## 2018-12-28 MED ORDER — OXYCODONE HCL 5 MG PO TABS: 5.0000 mg | ORAL_TABLET | ORAL | Status: DC | PRN

## 2018-12-28 MED ORDER — BUPIVACAINE-EPINEPHRINE (PF) 0.5% -1:200000 IJ SOLN
INTRAMUSCULAR | Status: AC
  Filled 2018-12-28: qty 30

## 2018-12-28 MED ORDER — ACETAMINOPHEN 650 MG RE SUPP: 650.0000 mg | RECTAL | Status: DC | PRN

## 2018-12-28 MED ORDER — SUGAMMADEX SODIUM 200 MG/2ML IV SOLN
INTRAVENOUS | Status: DC | PRN
  Administered 2018-12-28: 200 mg via INTRAVENOUS

## 2018-12-28 MED ORDER — SODIUM CHLORIDE 0.9 % IV SOLN
INTRAVENOUS | Status: DC | PRN
  Administered 2018-12-28: 07:00:00

## 2018-12-28 MED ORDER — PROMETHAZINE HCL 25 MG/ML IJ SOLN: 6.2500 mg | INTRAMUSCULAR | Status: DC | PRN

## 2018-12-28 MED ORDER — SUCRALFATE 1 G PO TABS
1.0000 g | ORAL_TABLET | Freq: Every day | ORAL | Status: DC
  Administered 2018-12-28 – 2018-12-29 (×2): 1 g via ORAL
  Filled 2018-12-28 (×2): qty 1

## 2018-12-28 MED ORDER — LIDOCAINE 2% (20 MG/ML) 5 ML SYRINGE
INTRAMUSCULAR | Status: AC
  Filled 2018-12-28: qty 5

## 2018-12-28 MED ORDER — OXYCODONE HCL 5 MG PO TABS
10.0000 mg | ORAL_TABLET | ORAL | Status: DC | PRN
  Administered 2018-12-28 – 2018-12-29 (×6): 10 mg via ORAL
  Filled 2018-12-28 (×6): qty 2

## 2018-12-28 MED ORDER — BUPIVACAINE-EPINEPHRINE (PF) 0.5% -1:200000 IJ SOLN
INTRAMUSCULAR | Status: DC | PRN
  Administered 2018-12-28: 10 mL via PERINEURAL

## 2018-12-28 MED ORDER — ALUM & MAG HYDROXIDE-SIMETH 200-200-20 MG/5ML PO SUSP: 30.0000 mL | Freq: Four times a day (QID) | ORAL | Status: DC | PRN

## 2018-12-28 MED ORDER — LABETALOL HCL 5 MG/ML IV SOLN
INTRAVENOUS | Status: AC
  Filled 2018-12-28: qty 4

## 2018-12-28 MED ORDER — KETAMINE HCL 50 MG/5ML IJ SOSY
PREFILLED_SYRINGE | INTRAMUSCULAR | Status: AC
  Filled 2018-12-28: qty 5

## 2018-12-28 MED ORDER — CEFAZOLIN SODIUM-DEXTROSE 2-4 GM/100ML-% IV SOLN
2.0000 g | Freq: Three times a day (TID) | INTRAVENOUS | Status: AC
  Administered 2018-12-28 – 2018-12-29 (×2): 2 g via INTRAVENOUS
  Filled 2018-12-28 (×2): qty 100

## 2018-12-28 MED ORDER — DEXAMETHASONE SODIUM PHOSPHATE 4 MG/ML IJ SOLN: 4.0000 mg | Freq: Four times a day (QID) | INTRAMUSCULAR | Status: AC

## 2018-12-28 SURGICAL SUPPLY — 66 items
BAG DECANTER FOR FLEXI CONT (MISCELLANEOUS) ×3 IMPLANT
BASKET BONE COLLECTION (BASKET) ×3 IMPLANT
BENZOIN TINCTURE PRP APPL 2/3 (GAUZE/BANDAGES/DRESSINGS) ×3 IMPLANT
BIT DRILL NEURO 2X3.1 SFT TUCH (MISCELLANEOUS) ×1 IMPLANT
BLADE SURG 15 STRL LF DISP TIS (BLADE) ×1 IMPLANT
BLADE SURG 15 STRL SS (BLADE) ×2
BLADE ULTRA TIP 2M (BLADE) ×3 IMPLANT
BUR BARREL STRAIGHT FLUTE 4.0 (BURR) ×3 IMPLANT
BUR MATCHSTICK NEURO 3.0 LAGG (BURR) ×3 IMPLANT
CANISTER SUCT 3000ML PPV (MISCELLANEOUS) ×3 IMPLANT
CARTRIDGE OIL MAESTRO DRILL (MISCELLANEOUS) ×1 IMPLANT
CLOSURE WOUND 1/2 X4 (GAUZE/BANDAGES/DRESSINGS) ×1
COVER MAYO STAND STRL (DRAPES) ×3 IMPLANT
COVER WAND RF STERILE (DRAPES) ×3 IMPLANT
DECANTER SPIKE VIAL GLASS SM (MISCELLANEOUS) ×3 IMPLANT
DIFFUSER DRILL AIR PNEUMATIC (MISCELLANEOUS) ×3 IMPLANT
DRAPE LAPAROTOMY 100X72 PEDS (DRAPES) ×3 IMPLANT
DRAPE MICROSCOPE LEICA (MISCELLANEOUS) IMPLANT
DRAPE POUCH INSTRU U-SHP 10X18 (DRAPES) ×3 IMPLANT
DRAPE SURG 17X23 STRL (DRAPES) ×6 IMPLANT
DRILL NEURO 2X3.1 SOFT TOUCH (MISCELLANEOUS) ×3
DRSG OPSITE POSTOP 4X6 (GAUZE/BANDAGES/DRESSINGS) ×3 IMPLANT
ELECT BLADE 4.0 EZ CLEAN MEGAD (MISCELLANEOUS) ×3
ELECT REM PT RETURN 9FT ADLT (ELECTROSURGICAL) ×3
ELECTRODE BLDE 4.0 EZ CLN MEGD (MISCELLANEOUS) ×1 IMPLANT
ELECTRODE REM PT RTRN 9FT ADLT (ELECTROSURGICAL) ×1 IMPLANT
GAUZE 4X4 16PLY RFD (DISPOSABLE) IMPLANT
GAUZE SPONGE 4X4 12PLY STRL (GAUZE/BANDAGES/DRESSINGS) ×3 IMPLANT
GLOVE BIO SURGEON STRL SZ 6.5 (GLOVE) ×2 IMPLANT
GLOVE BIO SURGEON STRL SZ8 (GLOVE) ×3 IMPLANT
GLOVE BIO SURGEON STRL SZ8.5 (GLOVE) ×3 IMPLANT
GLOVE BIO SURGEONS STRL SZ 6.5 (GLOVE) ×1
GLOVE BIOGEL PI IND STRL 6.5 (GLOVE) ×2 IMPLANT
GLOVE BIOGEL PI IND STRL 7.5 (GLOVE) ×1 IMPLANT
GLOVE BIOGEL PI INDICATOR 6.5 (GLOVE) ×4
GLOVE BIOGEL PI INDICATOR 7.5 (GLOVE) ×2
GLOVE EXAM NITRILE XL STR (GLOVE) IMPLANT
GLOVE SURG SS PI 7.0 STRL IVOR (GLOVE) ×9 IMPLANT
GOWN STRL REUS W/ TWL LRG LVL3 (GOWN DISPOSABLE) ×1 IMPLANT
GOWN STRL REUS W/ TWL XL LVL3 (GOWN DISPOSABLE) ×3 IMPLANT
GOWN STRL REUS W/TWL LRG LVL3 (GOWN DISPOSABLE) ×3
GOWN STRL REUS W/TWL XL LVL3 (GOWN DISPOSABLE) ×6
HEMOSTAT POWDER KIT SURGIFOAM (HEMOSTASIS) ×3 IMPLANT
KIT BASIN OR (CUSTOM PROCEDURE TRAY) ×3 IMPLANT
KIT TURNOVER KIT B (KITS) ×3 IMPLANT
MARKER SKIN DUAL TIP RULER LAB (MISCELLANEOUS) ×3 IMPLANT
NEEDLE HYPO 22GX1.5 SAFETY (NEEDLE) ×3 IMPLANT
NEEDLE SPNL 18GX3.5 QUINCKE PK (NEEDLE) ×3 IMPLANT
NS IRRIG 1000ML POUR BTL (IV SOLUTION) ×3 IMPLANT
OIL CARTRIDGE MAESTRO DRILL (MISCELLANEOUS) ×3
PACK LAMINECTOMY NEURO (CUSTOM PROCEDURE TRAY) ×3 IMPLANT
PIN DISTRACTION 14MM (PIN) ×6 IMPLANT
PLATE ONE LEVEL SKYLINE 14MM (Plate) ×3 IMPLANT
PUTTY FIBERGRAFT BG 2CC (Putty) ×3 IMPLANT
RUBBERBAND STERILE (MISCELLANEOUS) IMPLANT
SCREW VAR SELF TAP SKYLINE 14M (Screw) ×12 IMPLANT
SPACER CIF 7 4D SM (Spacer) ×3 IMPLANT
SPONGE INTESTINAL PEANUT (DISPOSABLE) ×12 IMPLANT
SPONGE SURGIFOAM ABS GEL SZ50 (HEMOSTASIS) IMPLANT
STRIP CLOSURE SKIN 1/2X4 (GAUZE/BANDAGES/DRESSINGS) ×2 IMPLANT
SUT VIC AB 0 CT1 27 (SUTURE) ×2
SUT VIC AB 0 CT1 27XBRD ANTBC (SUTURE) ×1 IMPLANT
SUT VIC AB 3-0 SH 8-18 (SUTURE) ×3 IMPLANT
TOWEL GREEN STERILE (TOWEL DISPOSABLE) ×3 IMPLANT
TOWEL GREEN STERILE FF (TOWEL DISPOSABLE) ×3 IMPLANT
WATER STERILE IRR 1000ML POUR (IV SOLUTION) ×3 IMPLANT

## 2018-12-28 NOTE — H&P (Signed)
Subjective: The patient is a 68 year old black male on whom I previously performed a C4-5 anterior cervical discectomy, fusion and plating.  Recently he developed a mass in his right neck.  He underwent a right neck dissection and was diagnosed with throat cancer.  He was treated with postoperative radiation therapy.  Around this time he complained of increasing neck pain, frequent falls, diffuse weakness, hand clumsiness, etc.  He was worked up with a cervical MRI which demonstrated cervical spondylosis and significant stenosis at C3-4.  I discussed the various treatment options.  He has decided to proceed with surgery after weighing the risks, benefits and alternatives.  Past Medical History:  Diagnosis Date  . Alcohol abuse    until age 35  . Arthritis   . Bell's palsy 05/2017  . BPH (benign prostatic hyperplasia)   . Cervical spondylosis with myelopathy   . Chronic hepatitis C without hepatic coma ([place]) FOLLOWED BY INFECTOUS DISEASE DR COMER   POSITIVE ANTIBODY TEST THIS YEAR 2016--  CURRENT TX ON HARVONI PO- completeed 2016  . Chronic low back pain   . Collapsed lung    right .Marland Kitchen..15 yrs ago, fell and hit some bricks  . DDD (degenerative disc disease), lumbosacral   . Depression    no meds now- med used for bladder  . 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 ED (erectile dysfunction)   . Foley catheter in place    removed, post lumbar surgery- cath left in throughh rehab- had 2 UTI's  . Full dentures   . GERD (gastroesophageal reflux disease)    hx none in long time  . History of cocaine abuse ([place])    per pt quit and last used 06-03-2013  . History of radiation therapy 07/11/18- 09/01/18   Oropharynx/ treated to 60 Gy in 30 fractions of 2 Gy  . Lymphoma ([place])   . Urinary retention   . Wears glasses     Past Surgical History:  Procedure Laterality Date  . APPENDECTOMY  1980's  . BACK SURGERY     lower back x2  . [place]  . CLOSED  REDUCTION AND INTRAMAXILLARY FIXATION BILATERAL MENTAL FX'S/  PLACEMENT MAXILLARY STENT/ APERTURE WIRE PLACEMENT  01-04-2000  . DIRECT LARYNGOSCOPY N/A 04/21/2018   Procedure: DIRECT LARYNGOSCOPY WITH BIOPSY OF TONGUE BASE AND NASOPHARYNX;  Surgeon: Jerrell Belfast, MD;  Location: [place];  Service: ENT;  Laterality: N/A;  Direct laryngoscopy with biopsy of tongue base and nasopharynx  . JOINT REPLACEMENT    . MULTIPLE TOOTH EXTRACTIONS    . POSTERIOR LUMBAR FUSION  03-05-2015   laminectomy /  nerve decompression--  L4 -- S1  . RADICAL NECK DISSECTION Right 05/31/2018   Procedure: RADICAL MODIFIED RADICAL NECK DISSECTION;  Surgeon: Jerrell Belfast, MD;  Location: [place];  Service: ENT;  Laterality: Right;  . RADIOLOGY WITH ANESTHESIA N/A 08/08/2018   Procedure: MRI CERVICAL SPINE WITHOUT CONTRAST;  Surgeon: Radiologist, Medication, MD;  Location: [place];  Service: Radiology;  Laterality: N/A;  . TONSILLECTOMY  04/21/2018   Procedure: RIGHT TONSILLECTOMY;  Surgeon: Jerrell Belfast, MD;  Location: [place];  Service: ENT;;  . TONSILLECTOMY    . TOTAL KNEE ARTHROPLASTY Left 09/21/2013   Procedure: LEFT TOTAL KNEE ARTHROPLASTY;  Surgeon: Yvette Rack., MD;  Location: [place];  Service: Orthopedics;  Laterality: Left;  . TRANSURETHRAL RESECTION OF PROSTATE N/A 06/16/2015   Procedure: TRANSURETHRAL RESECTION OF THE PROSTATE WITH GYRUS INSTRUMENTS;  Surgeon: Franchot Gallo, MD;  Location[facility];  Service: Urology;  Laterality: N/A;    No Known Allergies  Social History   Tobacco Use  . Smoking status: Never Smoker  . Smokeless tobacco: Never Used  Substance Use Topics  . Alcohol use: Yes    Comment: occasional    Family History  Problem Relation Age of Onset  . Liver cancer Paternal Grandmother   . Bone cancer Paternal Grandfather   . Stomach cancer Paternal Uncle   . Colon cancer Neg Hx   . Esophageal cancer Neg Hx   . Pancreatic cancer Neg Hx   . Inflammatory bowel  disease Neg Hx   . Rectal cancer Neg Hx    Prior to Admission medications   Medication Sig Start Date End Date Taking? Authorizing Provider  gabapentin (NEURONTIN) 300 MG capsule Take 300 mg by mouth 3 (three) times daily.   Yes [provider]  metFORMIN (GLUMETZA) 1000 MG (MOD) 24 hr tablet Take 1,000 mg by mouth 2 (two) times a day.   Yes [provider]  oxyCODONE (OXY IR/ROXICODONE) 5 MG immediate release tablet Take 1 tablet (5 mg total) by mouth every 4 (four) hours as needed for severe pain. Patient taking differently: Take 5 mg by mouth every 12 (twelve) hours.  09/29/18  Yes Tanner, Lyndon Code., PA-C  fluconazole (DIFLUCAN) 100 MG tablet HOLD ATORVASTATIN while on this medication. Take 2 tablets today, then 1 tablet daily x 20 more days. Patient not taking: Reported on 08/25/2018 08/22/18   Eppie Gibson, MD  lidocaine (XYLOCAINE) 2 % solution Use as directed 5 mLs in the mouth or throat as needed for mouth pain. Patient not taking: Reported on 12/26/2018 09/13/18   Harle Stanford., PA-C  magic mouthwash SOLN Take 5 mLs by mouth 4 (four) times daily as needed for mouth pain. Patient not taking: Reported on 12/26/2018 09/13/18   Harle Stanford., PA-C  Multiple Vitamins-Minerals (MULTIVITAMIN WITH IRON-MINERALS) liquid Take 15 mLs by mouth daily. Drink milk before taking this to protect stomach. Patient not taking: Reported on 12/26/2018 08/28/18   Eppie Gibson, MD  naproxen sodium (ALEVE) 220 MG tablet Take 220 mg by mouth every 8 (eight) hours as needed (pain).    [provider]  ondansetron (ZOFRAN ODT) 4 MG disintegrating tablet Take 1 tablet (4 mg total) by mouth every 8 (eight) hours as needed for nausea or vomiting. Place under tongue Patient not taking: Reported on 12/26/2018 08/25/18   Harle Stanford., PA-C  sertraline (ZOLOFT) 50 MG tablet Take 1 tablet (50 mg total) by mouth daily. Patient not taking: Reported on 12/26/2018 09/29/18   Sandi Mealy E., PA-C  sodium  fluoride (PREVIDENT 5000 PLUS) 1.1 % CREA dental cream Apply cream to tooth brush. Brush teeth for 2 minutes. Spit out excess-DO NOT swallow. Repeat nightly. Patient not taking: Reported on 12/26/2018 12/12/18   Eppie Gibson, MD  sucralfate (CARAFATE) 1 g tablet Take 1 tablet (1 g total) by mouth 4 (four) times daily -  with meals and at bedtime. Dissolve in 2 ounces of water then swallow Patient taking differently: Take 1 g by mouth daily. Dissolve in 2 ounces of water then swallow 10/20/18   Tanner, Lyndon Code., PA-C  tamsulosin (FLOMAX) 0.4 MG CAPS capsule Take 1 capsule (0.4 mg total) by mouth daily. Patient not taking: Reported on 12/26/2018 02/01/17   Duffy Bruce, MD     Review of Systems  Positive ROS: As above  All other systems have been reviewed and were otherwise negative with the exception of those mentioned in the HPI and as above.  Objective: Vital signs in last 24 hours: Temp:  [98.4 F (36.9 C)] 98.4 F (36.9 C) (05/28 0540) Pulse Rate:  [86] 86 (05/28 0540) Resp:  [18] 18 (05/28 0540) BP: (108)/(75) 108/75 (05/28 0540) SpO2:  [97 %] 97 % (05/28 0540) Weight:  [77.1 kg] 77.1 kg (05/28 0540) Estimated body mass index is 22.43 kg/m as calculated from the following:   Height as of this encounter: 6\' 1"  (1.854 m).   Weight as of this encounter: 77.1 kg.   General Appearance: Alert Head: Normocephalic, without obvious abnormality, atraumatic Eyes: PERRL, conjunctiva/corneas clear, EOM's intact,    Ears: Normal  Throat: Normal  Neck: The patient's right neck incision is healing well.  His old left neck incision is well-healed. Back: unremarkable Lungs: Clear to auscultation bilaterally, respirations unlabored Heart: Regular rate and rhythm, no murmur, rub or gallop Abdomen: Soft, non-tender Extremities: Extremities normal, atraumatic, no cyanosis or edema Skin: unremarkable  NEUROLOGIC:   Mental status: alert and oriented,Motor Exam - grossly normal Sensory Exam -  grossly normal Reflexes:  Coordination -unsteady gait Gait -unsteady gait Balance -unsteady Cranial Nerves: I: smell Not tested  II: visual acuity  OS: Normal  OD: Normal   II: visual fields Full to confrontation  II: pupils Equal, round, reactive to light  III,VII: ptosis None  III,IV,VI: extraocular muscles  Full ROM  V: mastication Normal  V: facial light touch sensation  Normal  V,VII: corneal reflex  Present  VII: facial muscle function - upper  Normal  VII: facial muscle function - lower Normal  VIII: hearing Not tested  IX: soft palate elevation  Normal  IX,X: gag reflex Present  XI: trapezius strength  5/5  XI: sternocleidomastoid strength 5/5  XI: neck flexion strength  5/5  XII: tongue strength  Normal    Data Review Lab Results  Component Value Date   WBC 4.2 12/28/2018   HGB 14.7 12/28/2018   HCT 43.4 12/28/2018   MCV 95.2 12/28/2018   PLT 223 12/28/2018   Lab Results  Component Value Date   NA 142 09/08/2018   K 4.4 09/08/2018   CL 103 09/08/2018   CO2 25 09/08/2018   BUN 10 12/11/2018   CREATININE 1.06 12/11/2018   GLUCOSE 96 09/08/2018   Lab Results  Component Value Date   INR 1.04 03/09/2018    Assessment/Plan: 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  I reviewed his MRI scan with him and pointed out the abnormalities.  We have discussed the various treatment options including surgery.  I have described the surgical treatment option of an exploration of his cervical fusion, removal of his old cervical plate and Y7-7 anterior cervical discectomy, fusion and plating.  I have shown him surgical models.  We have discussed the risks, benefits, alternatives, expected postoperative course, and likelihood of achieving our goals with surgery.  I have answered all his questions.  He has decided to proceed with surgery.   Ophelia Charter 12/28/2018  7:21 AM

## 2018-12-28 NOTE — Anesthesia Postprocedure Evaluation (Signed)
Anesthesia Post Note  Patient: RAESHAUN SIMSON  Procedure(s) Performed: ANTERIOR CERVICAL DECOMPRESSION/DISCECTOMY Trinidad Curet PROSTHESIS,PLATE/SCREWS CERVICAL THREE- CERVICAL FOUR EXPLORE FUSION WITH REMOVAL OLD HARDWARE (N/A )     Patient location during evaluation: PACU Anesthesia Type: General Level of consciousness: awake and alert Pain management: pain level controlled Vital Signs Assessment: post-procedure vital signs reviewed and stable Respiratory status: spontaneous breathing, nonlabored ventilation, respiratory function stable and patient connected to nasal cannula oxygen Cardiovascular status: blood pressure returned to baseline and stable Postop Assessment: no apparent nausea or vomiting Anesthetic complications: no    Last Vitals:  Vitals:   12/28/18 1130 12/28/18 1218  BP: 135/85 130/83  Pulse: 74 74  Resp: 13 14  Temp:  36.7 C  SpO2: 98% 96%    Last Pain:  Vitals:   12/28/18 1218  TempSrc: Oral  PainSc: 0-No pain                 Ryan P Ellender

## 2018-12-28 NOTE — Progress Notes (Signed)
Orthopedic Tech Progress Note Patient Details:  [name] 26-Jul-1951 [id number] PACU RN called requesting Collar but I let her know we order them here in the hospital in materials. So she said she would order one Patient ID: [name], male   DOB: [birth year], 68 y.o.   MRN: [id number]   [name] 12/28/2018, 11:38 AM

## 2018-12-28 NOTE — Anesthesia Procedure Notes (Signed)
Procedure Name: Intubation Date/Time: 12/28/2018 7:44 AM Performed by: Elayne Snare, CRNA Pre-anesthesia Checklist: Patient identified, Emergency Drugs available, Suction available and Patient being monitored Patient Re-evaluated:Patient Re-evaluated prior to induction Oxygen Delivery Method: Circle System Utilized Preoxygenation: Pre-oxygenation with 100% oxygen Induction Type: IV induction and Rapid sequence Laryngoscope Size: Glidescope and 4 Grade View: Grade I Tube type: Oral Tube size: 7.5 mm Number of attempts: 1 Airway Equipment and Method: Stylet Placement Confirmation: ETT inserted through vocal cords under direct vision,  positive ETCO2 and breath sounds checked- equal and bilateral Secured at: 22 cm Tube secured with: Tape Dental Injury: Teeth and Oropharynx as per pre-operative assessment  Comments: 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

## 2018-12-28 NOTE — Transfer of Care (Signed)
Immediate Anesthesia Transfer of Care Note  Patient: HARLEY FITZWATER  Procedure(s) Performed: ANTERIOR CERVICAL DECOMPRESSION/DISCECTOMY Trinidad Curet PROSTHESIS,PLATE/SCREWS CERVICAL THREE- CERVICAL FOUR EXPLORE FUSION WITH REMOVAL OLD HARDWARE (N/A )  Patient Location: PACU  Anesthesia Type:General  Level of Consciousness: drowsy and patient cooperative  Airway & Oxygen Therapy: Patient Spontanous Breathing  Post-op Assessment: Report given to RN and Post -op Vital signs reviewed and stable  Post vital signs: Reviewed and stable  Last Vitals:  Vitals Value Taken Time  BP 165/106 12/28/2018 10:45 AM  Temp    Pulse 97 12/28/2018 10:46 AM  Resp 13 12/28/2018 10:46 AM  SpO2 94 % 12/28/2018 10:46 AM  Vitals shown include unvalidated device data.  Last Pain:  Vitals:   12/28/18 0618  TempSrc:   PainSc: 8       Patients Stated Pain Goal: 3 (28/41/32 4401)  Complications: No apparent anesthesia complications

## 2018-12-28 NOTE — Progress Notes (Signed)
Subjective: The patient is somnolent but arousable.  He is in no apparent distress.  Objective: Vital signs in last 24 hours: Temp:  [98 F (36.7 C)-98.4 F (36.9 C)] (P) 98 F (36.7 C) (05/28 1045) Pulse Rate:  [86] 86 (05/28 0540) Resp:  [18] 18 (05/28 0540) BP: (108)/(75) 108/75 (05/28 0540) SpO2:  [97 %] 97 % (05/28 0540) Weight:  [77.1 kg] 77.1 kg (05/28 0540) Estimated body mass index is 22.43 kg/m as calculated from the following:   Height as of this encounter: 6\' 1"  (1.854 m).   Weight as of this encounter: 77.1 kg.   Intake/Output from previous day: No intake/output data recorded. Intake/Output this shift: Total I/O In: 1300 [I.V.:1300] Out: 400 [Blood:400]  Physical exam the patient is somnolent but arousable.  He is moving all 4 extremities.  The patient's dressing is clean and dry.  There is no hematoma or shift.  Lab Results: Recent Labs    12/28/18 0637  WBC 4.2  HGB 14.7  HCT 43.4  PLT 223   BMET Recent Labs    12/28/18 0637  NA 138  K 3.8  CL 102  CO2 27  GLUCOSE 65*  BUN 9  CREATININE 0.92  CALCIUM 9.5    Studies/Results: No results found.  Assessment/Plan: The patient is doing well.  I spoke with his daughter.  LOS: 0 days     Ophelia Charter 12/28/2018, 11:00 AM

## 2018-12-28 NOTE — Op Note (Signed)
Brief history: The patient is a 68 year old black male on whom I previously performed a C4-5 anterior cervicectomy, fusion and plating many years ago.  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  He complains of neck pain, unsteady gait, clumsy hands, etc.  He was worked up with a cervical MRI which demonstrated multilevel spondylosis with stenosis and spinal cord compression most prominent at C3-4.  I discussed the various treatment options with him.  He has weighed the risks, benefits and alternatives surgery and decided proceed with a C3-4 anterior cervical discectomy fusion plating with exploration of his old fusion.  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 Earle Gell  Asst.: Dr. Jovita Gamma  Anesthesia: Gen. endotracheal  Estimated blood loss: 100 cc  Drains: None  Complications: None  Description of procedure: The patient was brought to the operating room by the anesthesia team. General endotracheal anesthesia was induced. A roll was placed under the patient's shoulders to keep the neck in the neutral position. The patient's anterior cervical region was then prepared with Betadine scrub and Betadine solution. Sterile drapes were applied.  The area to be incised was then injected with Marcaine with epinephrine solution. I then used a scalpel to make a transverse incision in the patient's left anterior neck, incising through the old surgical scar. I used  the Metzenbaum scissors to divide the platysmal muscle and then to dissect through the scar tissue, medial to the sternocleidomastoid muscle, jugular vein, and carotid artery. I carefully dissected down towards the anterior cervical spine identifying the esophagus and retracting it medially. Then using Kitner swabs to clear soft tissue from the anterior cervical spine. We then inserted a bent spinal needle into the upper exposed intervertebral disc space. We then obtained intraoperative radiographs confirm our location.  We began the exploration by close the old cervical plate at E9-3.  It was quite scarred in and had a quite a bit of spondylosis around new plate.  We had to use high-speed drill to drill away some of the spondylosis ventral to the plate and screws.  We unlocked the cams, remove the screws and remove the plate.  The arthrodesis appeared solid.  I then used electrocautery to detach the medial border of the longus colli muscle bilaterally from the 4 intervertebral disc spaces. I then inserted the Caspar self-retaining retractor underneath the longus colli muscle bilaterally to provide exposure.  We then incised the intervertebral disc at L3-4, it was quite spondylotic and collapsed. I then inserted distraction screws into the vertebral bodies at C3 and C4. We then distracted the interspace. We then used the high-speed drill to decorticate the vertebral endplates at Y1-0, to drill away the remainder of the intervertebral disc, to drill away some posterior spondylosis, and to thin out the posterior longitudinal ligament. I then incised ligament with the arachnoid knife. We then removed the ligament with a Kerrison punches undercutting the vertebral endplates and decompressing the thecal sac. We then performed foraminotomies about the bilateral C4 nerve roots. This completed the decompression at this level.  We now turned our to attention to the interbody fusion. We used  the trial spacers to  determine the appropriate size for the interbody prosthesis. We then pre-filled prosthesis with a combination of local morcellized autograft bone that we obtained during decompression as well as Depuy DBM. We then inserted the prosthesis into the distracted interspace at C3-4. We then removed the distraction screws. There was a good snug fit of the prosthesis in the interspace.  Having completed the fusion we now turned attention to the anterior spinal instrumentation. We used the high-speed drill to drill away some anterior spondylosis at the disc spaces so that the plate lay down flat. We selected the appropriate length titanium anterior cervical plate. We laid it along the anterior aspect of the vertebral bodies from C3-4. We then drilled 14 mm holes at C3 and C4. We then secured the plate to the vertebral bodies by placing two 14 mm self-tapping screws at C3 and C4. We then obtained intraoperative radiograph. The demonstrating good position of the instrumentation. We therefore secured the screws the plate the locking each cam. This completed the instrumentation.  We then obtained hemostasis using bipolar electrocautery. We irrigated the wound out with bacitracin solution. We then removed the retractor. We inspected the esophagus for any damage. There was none apparent. We then reapproximated patient's platysmal muscle with interrupted 3-0 Vicryl suture. We then reapproximated the subcutaneous tissue with interrupted 3-0 Vicryl suture. The skin was reapproximated with Steri-Strips and benzoin. The wound was then covered with bacitracin ointment. A sterile dressing was applied. The drapes were removed. Patient was subsequently extubated by the anesthesia team and transported to the post anesthesia care unit in stable condition. All sponge instrument and needle counts were reportedly correct at the end of this case.

## 2018-12-29 LAB — GLUCOSE, CAPILLARY: Glucose-Capillary: 161 mg/dL — ABNORMAL HIGH (ref 70–99)

## 2018-12-29 MED ORDER — DOCUSATE SODIUM 100 MG PO CAPS: 100.0000 mg | ORAL_CAPSULE | Freq: Two times a day (BID) | ORAL | 0 refills | Status: DC

## 2018-12-29 MED ORDER — OXYCODONE HCL 10 MG PO TABS: 10.0000 mg | ORAL_TABLET | ORAL | 0 refills | Status: DC | PRN

## 2018-12-29 MED ORDER — CYCLOBENZAPRINE HCL 10 MG PO TABS: 10.0000 mg | ORAL_TABLET | Freq: Three times a day (TID) | ORAL | 0 refills | Status: DC | PRN

## 2018-12-29 NOTE — Evaluation (Signed)
Occupational Therapy Evaluation and Discharge Patient Details Name: [name] MRN: [id number] DOB: Nov 03, 1950 Today's Date: 12/29/2018    History of Present Illness s/p C3-4 ACDF PMH: throat cancer s/p radiation, multiple spine surgeries, L TKA, Hep C, DDD, DM, remote polysubstance abuse.   Clinical Impression   Pt is functioning modified independently. All education completed. No further OT needs.    Follow Up Recommendations  No OT follow up    Equipment Recommendations  None recommended by OT    Recommendations for Other Services       Precautions / Restrictions Precautions Precautions: Cervical Precaution Booklet Issued: Yes (comment) Precaution Comments: reviewed body mechanics during mobility and ADL Required Braces or Orthoses: Cervical Brace Cervical Brace: Hard collar;At all times Restrictions Weight Bearing Restrictions: No Other Position/Activity Restrictions: educated in donning and doffing brace correctly      Mobility Bed Mobility               General bed mobility comments: pt in bathroom upon entry  Transfers Overall transfer level: Independent Equipment used: None                  Balance Overall balance assessment: Mild deficits observed, not formally tested                                         ADL either performed or assessed with clinical judgement   ADL Overall ADL's : Modified independent                                       General ADL Comments: educated in cervical precautions related to ADL and IADL and compensatory strategies with pt verbalizing and/or demonstrating understanding     Vision Baseline Vision/History: Wears glasses Wears Glasses: At all times Patient Visual Report: No change from baseline       Perception     Praxis      Pertinent Vitals/Pain Pain Assessment: Faces Faces Pain Scale: Hurts a little bit Pain Location: incision Pain Descriptors / Indicators:  Sore Pain Intervention(s): Monitored during session     Hand Dominance Right   Extremity/Trunk Assessment Upper Extremity Assessment Upper Extremity Assessment: Overall WFL for tasks assessed   Lower Extremity Assessment Lower Extremity Assessment: Overall WFL for tasks assessed       Communication Communication Communication: No difficulties   Cognition Arousal/Alertness: Awake/alert Behavior During Therapy: WFL for tasks assessed/performed Overall Cognitive Status: Within Functional Limits for tasks assessed                                     General Comments       Exercises     Shoulder Instructions      Home Living Family/patient expects to be discharged to:: Private residence Living Arrangements: Alone Available Help at Discharge: Family;Available PRN/intermittently Type of Home: Apartment Home Access: Elevator     Home Layout: One level     Bathroom Shower/Tub: Teacher, early years/pre: Standard     Home Equipment: None   Additional Comments: daughter can help get groceries      Prior Functioning/Environment Level of Independence: Independent        Comments: does not drive  OT Problem List:        OT Treatment/Interventions:      OT Goals(Current goals can be found in the care plan section) Acute Rehab OT Goals Patient Stated Goal: return home  OT Frequency:     Barriers to D/C:            Co-evaluation              AM-PAC OT "6 Clicks" Daily Activity     Outcome Measure Help from another person eating meals?: None Help from another person taking care of personal grooming?: None Help from another person toileting, which includes using toliet, bedpan, or urinal?: None Help from another person bathing (including washing, rinsing, drying)?: None Help from another person to put on and taking off regular upper body clothing?: None Help from another person to put on and taking off regular lower  body clothing?: None 6 Click Score: 24   End of Session Equipment Utilized During Treatment: Gait belt;Cervical collar  Activity Tolerance: Patient tolerated treatment well Patient left: in bed;with call bell/phone within reach(EOB)  OT Visit Diagnosis: Pain                Time: 0488-8916 OT Time Calculation (min): 23 min Charges:  OT General Charges $OT Visit: 1 Visit OT Evaluation $OT Eval Low Complexity: 1 Low OT Treatments $Self Care/Home Management : 8-22 mins  Nestor Lewandowsky, OTR/L Acute Rehabilitation Services Pager: [phone] Office: [phone]  Malka So 12/29/2018, 8:36 AM

## 2018-12-29 NOTE — Discharge Summary (Signed)
Physician Discharge Summary Patient ID: [name] MRN: [id number] DOB/AGE: Dec 29, 1950 68 y.o.  Admit date: 12/28/2018 Discharge date: 12/29/2018  Admission Diagnoses:Cervical spondylosis, cervical myelopathy, cervical spinal stenosis, cervicalgia  Discharge Diagnoses:  The same Active Problems:   Cervical spondylosis with myelopathy and radiculopathy   Discharged Condition: good  Hospital Course:  I performed an exploration of the patient's cervical fusion with a C3-4 anterior cervical diskectomy, fusion and plating on 12/28/2018.  The surgery went well.    The patient's postoperative course was unremarkable.  On postoperative day 1. The patient requested discharge home.  He was given written and oral discharge instructions.  All his questions were answered.  Consults:  Occupational therapy Significant Diagnostic Studies: none Treatments: exploration of cervical fusion, C3-4 anterior cervical diskectomy, fusion and plating Discharge Exam: Blood pressure 119/73, pulse 77, temperature 98.6 F (37 C), resp. rate 18, height 6\' 1"  (1.854 m), weight 77.1 kg, SpO2 96 %.  the patient is alert and pleasant.  He looks well.  His dressing is clean and dry.  There is no hematoma or shift.  He is moving all 4 extremities well.  Disposition:   Home  Discharge Instructions   Call MD for:  difficulty breathing, headache or visual disturbances   Complete by:  As directed   Call MD for:  extreme fatigue   Complete by:  As directed   Call MD for:  hives   Complete by:  As directed   Call MD for:  persistant dizziness or light-headedness   Complete by:  As directed   Call MD for:  persistant nausea and vomiting   Complete by:  As directed   Call MD for:  redness, tenderness, or signs of infection (pain, swelling, redness, odor or green/yellow discharge around incision site)   Complete by:  As directed   Call MD for:  severe uncontrolled pain   Complete by:  As directed   Call MD for:   temperature >100.4   Complete by:  As directed   Diet - low sodium heart healthy   Complete by:  As directed   Discharge instructions   Complete by:  As directed   Call [phone] for a followup appointment. Take a stool softener while you are using pain medications.  Driving Restrictions   Complete by:  As directed   Do not drive for 2 weeks.  Increase activity slowly   Complete by:  As directed   Lifting restrictions   Complete by:  As directed   Do not lift more than 5 pounds. No excessive bending or twisting.  May shower / Bathe   Complete by:  As directed   Remove the dressing for 3 days after surgery.  You may shower, but leave the incision alone.  Remove dressing in 48 hours   Complete by:  As directed   Your stitches are under the scan and will dissolve by themselves. The Steri-Strips will fall off after you take a few showers. Do not rub back or pick at the wound, Leave the wound alone.   Allergies as of 12/29/2018  No Known Allergies   Medication List  STOP taking these medications  fluconazole 100 MG tablet Commonly known as:  DIFLUCAN  magic mouthwash Soln  naproxen sodium 220 MG tablet Commonly known as:  ALEVE  ondansetron 4 MG disintegrating tablet Commonly known as:  Zofran ODT  sertraline 50 MG tablet Commonly known as:  ZOLOFT  tamsulosin 0.4 MG Caps capsule Commonly known as:  Flomax   TAKE these medications  cyclobenzaprine 10 MG tablet Commonly known as:  FLEXERIL Take 1 tablet (10 mg total) by mouth 3 (three) times daily as needed for muscle spasms.  docusate sodium 100 MG capsule Commonly known as:  COLACE Take 1 capsule (100 mg total) by mouth 2 (two) times daily.  gabapentin 300 MG capsule Commonly known as:  NEURONTIN Take 300 mg by mouth 3 (three) times daily.  lidocaine 2 % solution Commonly known as:  XYLOCAINE Use as directed 5 mLs in the mouth or throat as needed for mouth pain.  metFORMIN 1000 MG (MOD) 24 hr  tablet Commonly known as:  GLUMETZA Take 1,000 mg by mouth 2 (two) times a day.  multivitamin with iron-minerals liquid Take 15 mLs by mouth daily. Drink milk before taking this to protect stomach.  Oxycodone HCl 10 MG Tabs Take 1 tablet (10 mg total) by mouth every 4 (four) hours as needed for severe pain ((score 7 to 10)). What changed:   medication strength how much to take reasons to take this  sodium fluoride 1.1 % Crea dental cream Commonly known as:  PreviDent 5000 Plus Apply cream to tooth brush. Brush teeth for 2 minutes. Spit out excess-DO NOT swallow. Repeat nightly.  sucralfate 1 g tablet Commonly known as:  Carafate Take 1 tablet (1 g total) by mouth 4 (four) times daily -  with meals and at bedtime. Dissolve in 2 ounces of water then swallow What changed:  when to take this      Signed: Ophelia Charter 12/29/2018, 7:10 AM

## 2019-01-03 ENCOUNTER — Encounter (HOSPITAL_COMMUNITY): Payer: Self-pay | Admitting: Neurosurgery

## 2019-01-26 ENCOUNTER — Telehealth: Payer: Self-pay | Admitting: *Deleted

## 2019-01-26 DIAGNOSIS — M1711 Unilateral primary osteoarthritis, right knee: Secondary | ICD-10-CM

## 2019-01-26 NOTE — Telephone Encounter (Signed)
Oncology Nurse Navigator Documentation  Received call from [name] asking for clarification of his diagnosis.  Provided information based on path reports and Dr. Pearlie Oyster consult notes.  Gayleen Orem, RN, BSN Head & Neck Oncology Nurse [place] [facility] [place] [phone]

## 2019-02-01 ENCOUNTER — Ambulatory Visit: Payer: Self-pay | Admitting: Physician Assistant

## 2019-02-01 NOTE — H&P (Signed)
TOTAL KNEE ADMISSION H&P  Patient is being admitted for right total knee arthroplasty.  Subjective:  Chief Complaint:right knee pain.  HPI: [name], 68 y.o. male, has a history of pain and functional disability in the right knee due to arthritis and has failed non-surgical conservative treatments for greater than 12 weeks to includeNSAID's and/or analgesics, corticosteriod injections, weight reduction as appropriate and activity modification.  Onset of symptoms was gradual, starting 6 years ago with gradually worsening course since that time. The patient noted no past surgery on the right knee(s).  Patient currently rates pain in the right knee(s) at 10 out of 10 with activity. Patient has night pain, worsening of pain with activity and weight bearing, pain that interferes with activities of daily living, pain with passive range of motion, crepitus and joint swelling.  Patient has evidence of periarticular osteophytes and joint space narrowing by imaging studies.There is no active infection.  Patient Active Problem List   Diagnosis Date Noted  . Osteoarthritis of right knee 01/26/2019  . Cervical spondylosis with myelopathy and radiculopathy 12/28/2018  . Metastatic squamous cell carcinoma to head and neck ([place]) 05/31/2018  . Rectal bleeding 05/30/2018  . Dark stools 05/30/2018  . NSAID long-term use 05/30/2018  . Secondary malignancy of lymph nodes of head, face and neck ([place]) 05/12/2018  . 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 Cervical stenosis of spinal canal 05/10/2018  . Neck mass 04/21/2018  . Liver fibrosis 04/05/2018  . Fusion of lumbar spine 02/07/2017  . Constipation due to opioid therapy 01/27/2017  . Lumbar stenosis with neurogenic claudication 01/24/2017  . Enlarged prostate with urinary obstruction 06/16/2015  . Lumbar degenerative disc disease 03/05/2015  . Chronic hepatitis C without hepatic coma ([place]) 12/09/2014  .  Chronic low back pain 01/17/2014  . Osteoarthritis of left knee 09/21/2013  . Alcohol dependence ([place]) 06/12/2013  . Cocaine dependence ([place]) 06/12/2013  . Substance induced mood disorder ([place]) 06/12/2013   Past Medical History:  Diagnosis Date  . Alcohol abuse    until age 36  . Arthritis   . Bell's palsy 05/2017  . BPH (benign prostatic hyperplasia)   . Cervical spondylosis with myelopathy   . Chronic hepatitis C without hepatic coma ([place]) FOLLOWED BY INFECTOUS DISEASE DR COMER   POSITIVE ANTIBODY TEST THIS YEAR 2016--  CURRENT TX ON HARVONI PO- completeed 2016  . Chronic low back pain   . Collapsed lung    right .Marland Kitchen..15 yrs ago, fell and hit some bricks  . DDD (degenerative disc disease), lumbosacral   . Depression    no meds now- med used for bladder  . 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 ED (erectile dysfunction)   . Foley catheter in place    removed, post lumbar surgery- cath left in throughh rehab- had 2 UTI's  . Full dentures   . GERD (gastroesophageal reflux disease)    hx none in long time  . History of cocaine abuse ([place])    per pt quit and last used 06-03-2013  . History of radiation therapy 07/11/18- 09/01/18   Oropharynx/ treated to 60 Gy in 30 fractions of 2 Gy  . Lymphoma ([place])   . Urinary retention   . Wears glasses     Past Surgical History:  Procedure Laterality Date  . ANTERIOR CERVICAL DECOMP/DISCECTOMY FUSION N/A 12/28/2018   Procedure: ANTERIOR CERVICAL DECOMPRESSION/DISCECTOMY FUSION,INTERBODY PROSTHESIS,PLATE/SCREWS CERVICAL THREE- CERVICAL FOUR EXPLORE  FUSION WITH REMOVAL OLD HARDWARE;  Surgeon: Newman Pies, MD;  Location: [place];  Service: Neurosurgery;  Laterality: N/A;  . APPENDECTOMY  1980's  . BACK SURGERY     lower back x2  . [place]  . CLOSED REDUCTION AND INTRAMAXILLARY FIXATION BILATERAL MENTAL FX'S/  PLACEMENT MAXILLARY STENT/ APERTURE WIRE PLACEMENT  01-04-2000  . DIRECT LARYNGOSCOPY N/A  04/21/2018   Procedure: DIRECT LARYNGOSCOPY WITH BIOPSY OF TONGUE BASE AND NASOPHARYNX;  Surgeon: Jerrell Belfast, MD;  Location: [place];  Service: ENT;  Laterality: N/A;  Direct laryngoscopy with biopsy of tongue base and nasopharynx  . JOINT REPLACEMENT    . MULTIPLE TOOTH EXTRACTIONS    . POSTERIOR LUMBAR FUSION  03-05-2015   laminectomy /  nerve decompression--  L4 -- S1  . RADICAL NECK DISSECTION Right 05/31/2018   Procedure: RADICAL MODIFIED RADICAL NECK DISSECTION;  Surgeon: Jerrell Belfast, MD;  Location: [place];  Service: ENT;  Laterality: Right;  . RADIOLOGY WITH ANESTHESIA N/A 08/08/2018   Procedure: MRI CERVICAL SPINE WITHOUT CONTRAST;  Surgeon: Radiologist, Medication, MD;  Location: [place];  Service: Radiology;  Laterality: N/A;  . TONSILLECTOMY  04/21/2018   Procedure: RIGHT TONSILLECTOMY;  Surgeon: Jerrell Belfast, MD;  Location: [place];  Service: ENT;;  . TONSILLECTOMY    . TOTAL KNEE ARTHROPLASTY Left 09/21/2013   Procedure: LEFT TOTAL KNEE ARTHROPLASTY;  Surgeon: Yvette Rack., MD;  Location: [place];  Service: Orthopedics;  Laterality: Left;  . TRANSURETHRAL RESECTION OF PROSTATE N/A 06/16/2015   Procedure: TRANSURETHRAL RESECTION OF THE PROSTATE WITH GYRUS INSTRUMENTS;  Surgeon: Franchot Gallo, MD;  Location: [facility];  Service: Urology;  Laterality: N/A;    Current Outpatient Medications  Medication Sig Dispense Refill Last Dose  . cyclobenzaprine (FLEXERIL) 10 MG tablet Take 1 tablet (10 mg total) by mouth 3 (three) times daily as needed for muscle spasms. 50 tablet 0   . docusate sodium (COLACE) 100 MG capsule Take 1 capsule (100 mg total) by mouth 2 (two) times daily. 60 capsule 0   . gabapentin (NEURONTIN) 300 MG capsule Take 300 mg by mouth 3 (three) times daily.   12/27/2018 at Unknown time  . lidocaine (XYLOCAINE) 2 % solution Use as directed 5 mLs in the mouth or throat as needed for mouth pain. (Patient not taking: Reported on 12/26/2018) 100 mL 3  Completed Course at Unknown time  . metFORMIN (GLUMETZA) 1000 MG (MOD) 24 hr tablet Take 1,000 mg by mouth 2 (two) times a day.   Past Week at Unknown time  . Multiple Vitamins-Minerals (MULTIVITAMIN WITH IRON-MINERALS) liquid Take 15 mLs by mouth daily. Drink milk before taking this to protect stomach. (Patient not taking: Reported on 12/26/2018) 236 mL 3 Not Taking at Unknown time  . oxyCODONE 10 MG TABS Take 1 tablet (10 mg total) by mouth every 4 (four) hours as needed for severe pain ((score 7 to 10)). 30 tablet 0   . sodium fluoride (PREVIDENT 5000 PLUS) 1.1 % CREA dental cream Apply cream to tooth brush. Brush teeth for 2 minutes. Spit out excess-DO NOT swallow. Repeat nightly. (Patient not taking: Reported on 12/26/2018) 1 Tube prn Not Taking at Unknown time  . sucralfate (CARAFATE) 1 g tablet Take 1 tablet (1 g total) by mouth 4 (four) times daily -  with meals and at bedtime. Dissolve in 2 ounces of water then swallow (Patient taking differently: Take 1 g by mouth daily. Dissolve in 2 ounces of  water then swallow) 120 tablet 1 Unknown at Unknown time   No current facility-administered medications for this visit.    Facility-Administered Medications Ordered in Other Visits  Medication Dose Route Frequency Provider Last Rate Last Dose  . 0.9 %  sodium chloride infusion   Intravenous Once Harle Stanford., PA-C      . ondansetron [facility]) injection 4 mg  4 mg Intravenous Once Harle Stanford., PA-C       No Known Allergies  Social History   Tobacco Use  . Smoking status: Never Smoker  . Smokeless tobacco: Never Used  Substance Use Topics  . Alcohol use: Yes    Comment: occasional    Family History  Problem Relation Age of Onset  . Liver cancer Paternal Grandmother   . Bone cancer Paternal Grandfather   . Stomach cancer Paternal Uncle   . Colon cancer Neg Hx   . Esophageal cancer Neg Hx   . Pancreatic cancer Neg Hx   . Inflammatory bowel disease Neg Hx   . Rectal cancer Neg Hx       Review of Systems  Constitutional: Positive for weight loss.  HENT: Positive for hearing loss and nosebleeds.   Musculoskeletal: Positive for joint pain.  Endo/Heme/Allergies: Bruises/bleeds easily.  All other systems reviewed and are negative.   Objective:  Physical Exam  Constitutional: He is oriented to person, place, and time. He appears well-developed and well-nourished. No distress.  HENT:  Head: Normocephalic and atraumatic.  Nose: Nose normal.  Eyes: Pupils are equal, round, and reactive to light. Conjunctivae and EOM are normal.  Neck: Normal range of motion. Neck supple.  Cardiovascular: Normal rate, regular rhythm, normal heart sounds and intact distal pulses.  Respiratory: Effort normal and breath sounds normal. No respiratory distress. He has no wheezes.  GI: Soft. Bowel sounds are normal. He exhibits no distension. There is no abdominal tenderness.  Musculoskeletal:     Right knee: He exhibits swelling. He exhibits no effusion. Tenderness found. Medial joint line and lateral joint line tenderness noted.  Lymphadenopathy:    He has no cervical adenopathy.  Neurological: He is alert and oriented to person, place, and time. No cranial nerve deficit.  Skin: Skin is warm and dry. No rash noted. No erythema.  Psychiatric: He has a normal mood and affect. His behavior is normal.    Vital signs in last 24 hours: @VSRANGES @  Labs:   Estimated body mass index is 22.43 kg/m as calculated from the following:   Height as of 12/28/18: 6\' 1"  (1.854 m).   Weight as of 12/28/18: 77.1 kg.   Imaging Review Plain radiographs demonstrate moderate degenerative joint disease of the right knee(s). The overall alignment ismild varus. The bone quality appears to be good for age and reported activity level.      Assessment/Plan:  End stage arthritis, right knee   The patient history, physical examination, clinical judgment of the provider and imaging studies are consistent  with end stage degenerative joint disease of the right knee(s) and total knee arthroplasty is deemed medically necessary. The treatment options including medical management, injection therapy arthroscopy and arthroplasty were discussed at length. The risks and benefits of total knee arthroplasty were presented and reviewed. The risks due to aseptic loosening, infection, stiffness, patella tracking problems, thromboembolic complications and other imponderables were discussed. The patient acknowledged the explanation, agreed to proceed with the plan and consent was signed. Patient is being admitted for inpatient treatment for surgery, pain control, PT,  OT, prophylactic antibiotics, VTE prophylaxis, progressive ambulation and ADL's and discharge planning. The patient is planning to be discharged home with home health services    Anticipated LOS equal to or greater than 2 midnights due to - Age 30 and older with one or more of the following:  - Obesity  - Expected need for hospital services (PT, OT, Nursing) required for safe  discharge  - Anticipated need for postoperative skilled nursing care or inpatient rehab  - Active co-morbidities: Chronic pain requiring opiods, Diabetes and Advanced Liver Disease OR   - Unanticipated findings during/Post Surgery: None  - Patient is a high risk of re-admission due to: Barriers to post-acute care (logistical, no family support in home)

## 2019-02-01 NOTE — H&P (View-Only) (Signed)
TOTAL KNEE ADMISSION H&P  Patient is being admitted for right total knee arthroplasty.  Subjective:  Chief Complaint:right knee pain.  HPI: [name], 68 y.o. male, has a history of pain and functional disability in the right knee due to arthritis and has failed non-surgical conservative treatments for greater than 12 weeks to includeNSAID's and/or analgesics, corticosteriod injections, weight reduction as appropriate and activity modification.  Onset of symptoms was gradual, starting 6 years ago with gradually worsening course since that time. The patient noted no past surgery on the right knee(s).  Patient currently rates pain in the right knee(s) at 10 out of 10 with activity. Patient has night pain, worsening of pain with activity and weight bearing, pain that interferes with activities of daily living, pain with passive range of motion, crepitus and joint swelling.  Patient has evidence of periarticular osteophytes and joint space narrowing by imaging studies.There is no active infection.  Patient Active Problem List   Diagnosis Date Noted  . Osteoarthritis of right knee 01/26/2019  . Cervical spondylosis with myelopathy and radiculopathy 12/28/2018  . 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 Rectal bleeding 05/30/2018  . Dark stools 05/30/2018  . NSAID long-term use 05/30/2018  . Secondary malignancy of lymph nodes of head, face and neck ([place]) 05/12/2018  . 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 Cervical stenosis of spinal canal 05/10/2018  . Neck mass 04/21/2018  . Liver fibrosis 04/05/2018  . Fusion of lumbar spine 02/07/2017  . Constipation due to opioid therapy 01/27/2017  . Lumbar stenosis with neurogenic claudication 01/24/2017  . Enlarged prostate with urinary obstruction 06/16/2015  . Lumbar degenerative disc disease 03/05/2015  . Chronic hepatitis C without hepatic coma ([place]) 12/09/2014  .  Chronic low back pain 01/17/2014  . Osteoarthritis of left knee 09/21/2013  . Alcohol dependence ([place]) 06/12/2013  . Cocaine dependence ([place]) 06/12/2013  . Substance induced mood disorder ([place]) 06/12/2013   Past Medical History:  Diagnosis Date  . Alcohol abuse    until age 89  . Arthritis   . Bell's palsy 05/2017  . BPH (benign prostatic hyperplasia)   . Cervical spondylosis with myelopathy   . Chronic hepatitis C without hepatic coma ([place]) FOLLOWED BY INFECTOUS DISEASE DR COMER   POSITIVE ANTIBODY TEST THIS YEAR 2016--  CURRENT TX ON HARVONI PO- completeed 2016  . Chronic low back pain   . Collapsed lung    right .Marland Kitchen..15 yrs ago, fell and hit some bricks  . DDD (degenerative disc disease), lumbosacral   . Depression    no meds now- med used for bladder  . 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 ED (erectile dysfunction)   . Foley catheter in place    removed, post lumbar surgery- cath left in throughh rehab- had 2 UTI's  . Full dentures   . GERD (gastroesophageal reflux disease)    hx none in long time  . History of cocaine abuse ([place])    per pt quit and last used 06-03-2013  . History of radiation therapy 07/11/18- 09/01/18   Oropharynx/ treated to 60 Gy in 30 fractions of 2 Gy  . Lymphoma ([place])   . Urinary retention   . Wears glasses     Past Surgical History:  Procedure Laterality Date  . ANTERIOR CERVICAL DECOMP/DISCECTOMY FUSION N/A 12/28/2018   Procedure: ANTERIOR CERVICAL DECOMPRESSION/DISCECTOMY FUSION,INTERBODY PROSTHESIS,PLATE/SCREWS CERVICAL THREE- CERVICAL FOUR EXPLORE  FUSION WITH REMOVAL OLD HARDWARE;  Surgeon: Newman Pies, MD;  Location: [place];  Service: Neurosurgery;  Laterality: N/A;  . APPENDECTOMY  1980's  . BACK SURGERY     lower back x2  . [place]  . CLOSED REDUCTION AND INTRAMAXILLARY FIXATION BILATERAL MENTAL FX'S/  PLACEMENT MAXILLARY STENT/ APERTURE WIRE PLACEMENT  01-04-2000  . DIRECT LARYNGOSCOPY N/A  04/21/2018   Procedure: DIRECT LARYNGOSCOPY WITH BIOPSY OF TONGUE BASE AND NASOPHARYNX;  Surgeon: Jerrell Belfast, MD;  Location: [place];  Service: ENT;  Laterality: N/A;  Direct laryngoscopy with biopsy of tongue base and nasopharynx  . JOINT REPLACEMENT    . MULTIPLE TOOTH EXTRACTIONS    . POSTERIOR LUMBAR FUSION  03-05-2015   laminectomy /  nerve decompression--  L4 -- S1  . RADICAL NECK DISSECTION Right 05/31/2018   Procedure: RADICAL MODIFIED RADICAL NECK DISSECTION;  Surgeon: Jerrell Belfast, MD;  Location: [place];  Service: ENT;  Laterality: Right;  . RADIOLOGY WITH ANESTHESIA N/A 08/08/2018   Procedure: MRI CERVICAL SPINE WITHOUT CONTRAST;  Surgeon: Radiologist, Medication, MD;  Location: [place];  Service: Radiology;  Laterality: N/A;  . TONSILLECTOMY  04/21/2018   Procedure: RIGHT TONSILLECTOMY;  Surgeon: Jerrell Belfast, MD;  Location: [place];  Service: ENT;;  . TONSILLECTOMY    . TOTAL KNEE ARTHROPLASTY Left 09/21/2013   Procedure: LEFT TOTAL KNEE ARTHROPLASTY;  Surgeon: Yvette Rack., MD;  Location: [place];  Service: Orthopedics;  Laterality: Left;  . TRANSURETHRAL RESECTION OF PROSTATE N/A 06/16/2015   Procedure: TRANSURETHRAL RESECTION OF THE PROSTATE WITH GYRUS INSTRUMENTS;  Surgeon: Franchot Gallo, MD;  Location: [facility];  Service: Urology;  Laterality: N/A;    Current Outpatient Medications  Medication Sig Dispense Refill Last Dose  . cyclobenzaprine (FLEXERIL) 10 MG tablet Take 1 tablet (10 mg total) by mouth 3 (three) times daily as needed for muscle spasms. 50 tablet 0   . docusate sodium (COLACE) 100 MG capsule Take 1 capsule (100 mg total) by mouth 2 (two) times daily. 60 capsule 0   . gabapentin (NEURONTIN) 300 MG capsule Take 300 mg by mouth 3 (three) times daily.   12/27/2018 at Unknown time  . lidocaine (XYLOCAINE) 2 % solution Use as directed 5 mLs in the mouth or throat as needed for mouth pain. (Patient not taking: Reported on 12/26/2018) 100 mL 3  Completed Course at Unknown time  . metFORMIN (GLUMETZA) 1000 MG (MOD) 24 hr tablet Take 1,000 mg by mouth 2 (two) times a day.   Past Week at Unknown time  . Multiple Vitamins-Minerals (MULTIVITAMIN WITH IRON-MINERALS) liquid Take 15 mLs by mouth daily. Drink milk before taking this to protect stomach. (Patient not taking: Reported on 12/26/2018) 236 mL 3 Not Taking at Unknown time  . oxyCODONE 10 MG TABS Take 1 tablet (10 mg total) by mouth every 4 (four) hours as needed for severe pain ((score 7 to 10)). 30 tablet 0   . sodium fluoride (PREVIDENT 5000 PLUS) 1.1 % CREA dental cream Apply cream to tooth brush. Brush teeth for 2 minutes. Spit out excess-DO NOT swallow. Repeat nightly. (Patient not taking: Reported on 12/26/2018) 1 Tube prn Not Taking at Unknown time  . sucralfate (CARAFATE) 1 g tablet Take 1 tablet (1 g total) by mouth 4 (four) times daily -  with meals and at bedtime. Dissolve in 2 ounces of water then swallow (Patient taking differently: Take 1 g by mouth daily. Dissolve in 2 ounces of  water then swallow) 120 tablet 1 Unknown at Unknown time   No current facility-administered medications for this visit.    Facility-Administered Medications Ordered in Other Visits  Medication Dose Route Frequency Provider Last Rate Last Dose  . 0.9 %  sodium chloride infusion   Intravenous Once Harle Stanford., PA-C      . ondansetron [facility]) injection 4 mg  4 mg Intravenous Once Harle Stanford., PA-C       No Known Allergies  Social History   Tobacco Use  . Smoking status: Never Smoker  . Smokeless tobacco: Never Used  Substance Use Topics  . Alcohol use: Yes    Comment: occasional    Family History  Problem Relation Age of Onset  . Liver cancer Paternal Grandmother   . Bone cancer Paternal Grandfather   . Stomach cancer Paternal Uncle   . Colon cancer Neg Hx   . Esophageal cancer Neg Hx   . Pancreatic cancer Neg Hx   . Inflammatory bowel disease Neg Hx   . Rectal cancer Neg Hx       Review of Systems  Constitutional: Positive for weight loss.  HENT: Positive for hearing loss and nosebleeds.   Musculoskeletal: Positive for joint pain.  Endo/Heme/Allergies: Bruises/bleeds easily.  All other systems reviewed and are negative.   Objective:  Physical Exam  Constitutional: He is oriented to person, place, and time. He appears well-developed and well-nourished. No distress.  HENT:  Head: Normocephalic and atraumatic.  Nose: Nose normal.  Eyes: Pupils are equal, round, and reactive to light. Conjunctivae and EOM are normal.  Neck: Normal range of motion. Neck supple.  Cardiovascular: Normal rate, regular rhythm, normal heart sounds and intact distal pulses.  Respiratory: Effort normal and breath sounds normal. No respiratory distress. He has no wheezes.  GI: Soft. Bowel sounds are normal. He exhibits no distension. There is no abdominal tenderness.  Musculoskeletal:     Right knee: He exhibits swelling. He exhibits no effusion. Tenderness found. Medial joint line and lateral joint line tenderness noted.  Lymphadenopathy:    He has no cervical adenopathy.  Neurological: He is alert and oriented to person, place, and time. No cranial nerve deficit.  Skin: Skin is warm and dry. No rash noted. No erythema.  Psychiatric: He has a normal mood and affect. His behavior is normal.    Vital signs in last 24 hours: @VSRANGES @  Labs:   Estimated body mass index is 22.43 kg/m as calculated from the following:   Height as of 12/28/18: 6\' 1"  (1.854 m).   Weight as of 12/28/18: 77.1 kg.   Imaging Review Plain radiographs demonstrate moderate degenerative joint disease of the right knee(s). The overall alignment ismild varus. The bone quality appears to be good for age and reported activity level.      Assessment/Plan:  End stage arthritis, right knee   The patient history, physical examination, clinical judgment of the provider and imaging studies are consistent  with end stage degenerative joint disease of the right knee(s) and total knee arthroplasty is deemed medically necessary. The treatment options including medical management, injection therapy arthroscopy and arthroplasty were discussed at length. The risks and benefits of total knee arthroplasty were presented and reviewed. The risks due to aseptic loosening, infection, stiffness, patella tracking problems, thromboembolic complications and other imponderables were discussed. The patient acknowledged the explanation, agreed to proceed with the plan and consent was signed. Patient is being admitted for inpatient treatment for surgery, pain control, PT,  OT, prophylactic antibiotics, VTE prophylaxis, progressive ambulation and ADL's and discharge planning. The patient is planning to be discharged home with home health services    Anticipated LOS equal to or greater than 2 midnights due to - Age 39 and older with one or more of the following:  - Obesity  - Expected need for hospital services (PT, OT, Nursing) required for safe  discharge  - Anticipated need for postoperative skilled nursing care or inpatient rehab  - Active co-morbidities: Chronic pain requiring opiods, Diabetes and Advanced Liver Disease OR   - Unanticipated findings during/Post Surgery: None  - Patient is a high risk of re-admission due to: Barriers to post-acute care (logistical, no family support in home)

## 2019-02-06 NOTE — Progress Notes (Signed)
EKG 06-06-18 Epic CHEST CT 12-12-18 Epic HEMAGLOBIN A1C 12-28-18 EPIC

## 2019-02-06 NOTE — Patient Instructions (Addendum)
YOU NEED TO HAVE A COVID 19 TEST ON_______ @_______ , THIS TEST MUST BE DONE BEFORE SURGERY, COME TO [facility] ENTRANCE. ONCE YOUR COVID TEST IS COMPLETED, PLEASE BEGIN THE QUARANTINE INSTRUCTIONS AS OUTLINED IN YOUR HANDOUT.                [name]   Your procedure is scheduled on: 02-16-2019   Report to [facility]  Entrance              Report to [facility] at 530  AM      Call this number if you have problems the morning of surgery [phone]    Remember: [place], NO [place].   NO SOLID FOOD AFTER MIDNIGHT THE NIGHT PRIOR TO SURGERY. NOTHING BY MOUTH EXCEPT CLEAR LIQUIDS UNTIL 430 AM. PLEASE FINISH G2 DRINK PER SURGEON ORDER 3 HOURS PRIOR TO SCHEDULED SURGERY TIMEWHICH NEEDS TO BE COMPLETED AT 430 AM.   CLEAR LIQUID DIET   Foods Allowed                                                                     Foods Excluded  Coffee and tea, regular and decaf                             liquids that you cannot  Plain Jell-O in any flavor                                             see through such as: Fruit ices (not with fruit pulp)                                     milk, soups, orange juice  Iced Popsicles                                    All solid food Carbonated beverages, regular and diet                                    Cranberry, grape and apple juices Sports drinks like Gatorade Lightly seasoned clear broth or consume(fat free) Sugar, honey syrup  Sample Menu Breakfast                                Lunch                                     Supper Cranberry juice                    Beef broth  Chicken broth Jell-O                                     Grape juice                           Apple juice Coffee or tea                        Jell-O                                      Popsicle                                                 Coffee or tea                        Coffee or tea  _____________________________________________________________________     Take these medicines the morning of surgery with A SIP OF WATER:   DO NOT TAKE ANY DIABETIC MEDICATIONS DAY OF YOUR SURGERY       How to Manage Your Diabetes Before and After Surgery  Why is it important to control my blood sugar before and after surgery? . Improving blood sugar levels before and after surgery helps healing and can limit problems. . A way of improving blood sugar control is eating a healthy diet by: o  Eating less sugar and carbohydrates o  Increasing activity/exercise o  Talking with your doctor about reaching your blood sugar goals . High blood sugars (greater than 180 mg/dL) can raise your risk of infections and slow your recovery, so you will need to focus on controlling your diabetes during the weeks before surgery. . Make sure that the doctor who takes care of your diabetes knows about your planned surgery including the date and location.  How do I manage my blood sugar before surgery? . Check your blood sugar at least 4 times a day, starting 2 days before surgery, to make sure that the level is not too high or low. o Check your blood sugar the morning of your surgery when you wake up and every 2 hours until you get to the Short Stay unit. . If your blood sugar is less than 70 mg/dL, you will need to treat for low blood sugar: o Do not take insulin. o Treat a low blood sugar (less than 70 mg/dL) with  cup of clear juice (cranberry or apple), 4 glucose tablets, OR glucose gel. o Recheck blood sugar in 15 minutes after treatment (to make sure it is greater than 70 mg/dL). If your blood sugar is not greater than 70 mg/dL on recheck, call [phone] for further instructions. . Report your blood sugar to the short stay nurse when you get to Short Stay.  . If you are admitted to the hospital after surgery: o Your blood sugar will  be checked by the staff and you will probably be given insulin after surgery (instead of oral diabetes medicines) to make sure you have good blood sugar levels. o The goal for blood sugar control after surgery is 80-180 mg/dL.  WHAT DO I DO ABOUT MY DIABETES MEDICATION?  Marland Kitchen   . THE DAY  BEFORE SURGERY TAKE METFORMIN AS USUAL.   THE MORNING OF SURGERY DO NOT TAKE METFORMIN.   Reviewed and Endorsed by [facility] Patient Education Committee, August 2015                          You may not have any metal on your body including hair pins and              piercings  Do not wear jewelry,  lotions, powders or perfumes, deodorant                          Men may shave face and neck.   Do not bring valuables to the hospital. [place].  Contacts, dentures or bridgework may not be worn into surgery.  _____________________________________________________________________           [facility] - Preparing for Surgery Before surgery, you can play an important role.  Because skin is not sterile, your skin needs to be as free of germs as possible.  You can reduce the number of germs on your skin by washing with CHG (chlorahexidine gluconate) soap before surgery.  CHG is an antiseptic cleaner which kills germs and bonds with the skin to continue killing germs even after washing. Please DO NOT use if you have an allergy to CHG or antibacterial soaps.  If your skin becomes reddened/irritated stop using the CHG and inform your nurse when you arrive at Short Stay. Do not shave (including legs and underarms) for at least 48 hours prior to the first CHG shower.  You may shave your face/neck. Please follow these instructions carefully:  1.  Shower with CHG Soap the night before surgery and the  morning of Surgery.  2.  If you choose to wash your hair, wash your hair first as usual with your  normal  shampoo.  3.  After you shampoo, rinse your hair and body  thoroughly to remove the  shampoo.                           4.  Use CHG as you would any other liquid soap.  You can apply chg directly  to the skin and wash                       Gently with a scrungie or clean washcloth.  5.  Apply the CHG Soap to your body ONLY FROM THE NECK DOWN.   Do not use on face/ open                           Wound or open sores. Avoid contact with eyes, ears mouth and genitals (private parts).                       Wash face,  Genitals (private parts) with your normal soap.             6.  Wash thoroughly, paying special attention to the area where your surgery  will be performed.  7.  Thoroughly rinse your body with warm water from the neck down.  8.  DO NOT shower/wash with your normal soap after using and rinsing off  the CHG Soap.                9.  Pat yourself dry with a clean towel.            10.  Wear clean pajamas.            11.  Place clean sheets on your bed the night of your first shower and do not  sleep with pets. Day of Surgery : Do not apply any lotions/deodorants the morning of surgery.  Please wear clean clothes to the hospital/surgery center.  FAILURE TO FOLLOW THESE INSTRUCTIONS MAY RESULT IN THE CANCELLATION OF YOUR SURGERY PATIENT SIGNATURE_________________________________  NURSE SIGNATURE__________________________________  ________________________________________________________________________   Adam Phenix  An incentive spirometer is a tool that can help keep your lungs clear and active. This tool measures how well you are filling your lungs with each breath. Taking long deep breaths may help reverse or decrease the chance of developing breathing (pulmonary) problems (especially infection) following:  A long period of time when you are unable to move or be active. BEFORE THE PROCEDURE   If the spirometer includes an indicator to show your best effort, your nurse or respiratory therapist will set it to a desired goal.  If  possible, sit up straight or lean slightly forward. Try not to slouch.  Hold the incentive spirometer in an upright position. INSTRUCTIONS FOR USE  1. Sit on the edge of your bed if possible, or sit up as far as you can in bed or on a chair. 2. Hold the incentive spirometer in an upright position. 3. Breathe out normally. 4. Place the mouthpiece in your mouth and seal your lips tightly around it. 5. Breathe in slowly and as deeply as possible, raising the piston or the ball toward the top of the column. 6. Hold your breath for 3-5 seconds or for as long as possible. Allow the piston or ball to fall to the bottom of the column. 7. Remove the mouthpiece from your mouth and breathe out normally. 8. Rest for a few seconds and repeat Steps 1 through 7 at least 10 times every 1-2 hours when you are awake. Take your time and take a few normal breaths between deep breaths. 9. The spirometer may include an indicator to show your best effort. Use the indicator as a goal to work toward during each repetition. 10. After each set of 10 deep breaths, practice coughing to be sure your lungs are clear. If you have an incision (the cut made at the time of surgery), support your incision when coughing by placing a pillow or rolled up towels firmly against it. Once you are able to get out of bed, walk around indoors and cough well. You may stop using the incentive spirometer when instructed by your caregiver.  RISKS AND COMPLICATIONS  Take your time so you do not get dizzy or light-headed.  If you are in pain, you may need to take or ask for pain medication before doing incentive spirometry. It is harder to take a deep breath if you are having pain. AFTER USE  Rest and breathe slowly and easily.  It can be helpful to keep track of a log of your progress. Your caregiver can provide you with a simple table to help with this. If you are using the spirometer at home, follow these instructions: [place]  IF:  You are having difficultly using the spirometer.  You have trouble using the spirometer as often as instructed.  Your pain medication is not giving enough relief while using the spirometer.  You develop fever of 100.5 F (38.1 C) or higher. SEEK IMMEDIATE MEDICAL CARE IF:   You cough up bloody sputum that had not been present before.  You develop fever of 102 F (38.9 C) or greater.  You develop worsening pain at or near the incision site. MAKE SURE YOU:   Understand these instructions.  Will watch your condition.  Will get help right away if you are not doing well or get worse. Document Released: 11/29/2006 Document Revised: 10/11/2011 Document Reviewed: 01/30/2007 ExitCare Patient Information 2014 ExitCare, [place].   ________________________________________________________________________  WHAT IS A BLOOD TRANSFUSION? Blood Transfusion Information  A transfusion is the replacement of blood or some of its parts. Blood is made up of multiple cells which provide different functions.  Red blood cells carry oxygen and are used for blood loss replacement.  White blood cells fight against infection.  Platelets control bleeding.  Plasma helps clot blood.  Other blood products are available for specialized needs, such as hemophilia or other clotting disorders. BEFORE THE TRANSFUSION  Who gives blood for transfusions?   Healthy volunteers who are fully evaluated to make sure their blood is safe. This is blood bank blood. Transfusion therapy is the safest it has ever been in the practice of medicine. Before blood is taken from a donor, a complete history is taken to make sure that person has no history of diseases nor engages in risky social behavior (examples are intravenous drug use or sexual activity with multiple partners). The donor's travel history is screened to minimize risk of transmitting infections, such as malaria. The donated blood is tested for signs of  infectious diseases, such as HIV and hepatitis. The blood is then tested to be sure it is compatible with you in order to minimize the chance of a transfusion reaction. If you or a relative donates blood, this is often done in anticipation of surgery and is not appropriate for emergency situations. It takes many days to process the donated blood. RISKS AND COMPLICATIONS Although transfusion therapy is very safe and saves many lives, the main dangers of transfusion include:   Getting an infectious disease.  Developing a transfusion reaction. This is an allergic reaction to something in the blood you were given. Every precaution is taken to prevent this. The decision to have a blood transfusion has been considered carefully by your caregiver before blood is given. Blood is not given unless the benefits outweigh the risks. AFTER THE TRANSFUSION  Right after receiving a blood transfusion, you will usually feel much better and more energetic. This is especially true if your red blood cells have gotten low (anemic). The transfusion raises the level of the red blood cells which carry oxygen, and this usually causes an energy increase.  The nurse administering the transfusion will monitor you carefully for complications. HOME CARE INSTRUCTIONS  No special instructions are needed after a transfusion. You may find your energy is better. Speak with your caregiver about any limitations on activity for underlying diseases you may have. SEEK MEDICAL CARE IF:   Your condition is not improving after your transfusion.  You develop redness or irritation at the intravenous (IV) site. SEEK IMMEDIATE MEDICAL CARE IF:  Any of the following symptoms occur over the next 12 hours:  Shaking chills.  You have a temperature by  mouth above 102 F (38.9 C), not controlled by medicine.  Chest, back, or muscle pain.  People around you feel you are not acting correctly or are confused.  Shortness of breath or  difficulty breathing.  Dizziness and fainting.  You get a rash or develop hives.  You have a decrease in urine output.  Your urine turns a dark color or changes to pink, red, or brown. Any of the following symptoms occur over the next 10 days:  You have a temperature by mouth above 102 F (38.9 C), not controlled by medicine.  Shortness of breath.  Weakness after normal activity.  The white part of the eye turns yellow (jaundice).  You have a decrease in the amount of urine or are urinating less often.  Your urine turns a dark color or changes to pink, red, or brown. Document Released: 07/16/2000 Document Revised: 10/11/2011 Document Reviewed: 03/04/2008 [facility] Patient Information 2014 [place], [place].  _______________________________________________________________________

## 2019-02-07 ENCOUNTER — Encounter (HOSPITAL_COMMUNITY)
Admission: RE | Admit: 2019-02-07 | Discharge: 2019-02-07 | Disposition: A | Discharge status: Home / Self Care | Payer: Medicare HMO | Source: Ambulatory Visit | Attending: Orthopedic Surgery | Admitting: Orthopedic Surgery

## 2019-02-07 ENCOUNTER — Other Ambulatory Visit: Payer: Self-pay

## 2019-02-07 ENCOUNTER — Ambulatory Visit (HOSPITAL_COMMUNITY): Payer: Medicare HMO | Admitting: Physician Assistant

## 2019-02-07 ENCOUNTER — Encounter (HOSPITAL_COMMUNITY): Payer: Self-pay

## 2019-02-07 ENCOUNTER — Ambulatory Visit (HOSPITAL_COMMUNITY): Payer: Medicare HMO | Admitting: Anesthesiology

## 2019-02-07 ENCOUNTER — Encounter (INDEPENDENT_AMBULATORY_CARE_PROVIDER_SITE_OTHER): Payer: Self-pay

## 2019-02-07 DIAGNOSIS — Z01818 Encounter for other preprocedural examination: Secondary | ICD-10-CM | POA: Diagnosis not present

## 2019-02-07 DIAGNOSIS — Z8619 Personal history of other infectious and parasitic diseases: Secondary | ICD-10-CM | POA: Insufficient documentation

## 2019-02-07 DIAGNOSIS — K219 Gastro-esophageal reflux disease without esophagitis: Secondary | ICD-10-CM | POA: Diagnosis not present

## 2019-02-07 DIAGNOSIS — F329 Major depressive disorder, single episode, unspecified: Secondary | ICD-10-CM | POA: Insufficient documentation

## 2019-02-07 DIAGNOSIS — E119 Type 2 diabetes mellitus without complications: Secondary | ICD-10-CM | POA: Diagnosis not present

## 2019-02-07 DIAGNOSIS — Z7984 Long term (current) use of oral hypoglycemic drugs: Secondary | ICD-10-CM | POA: Diagnosis not present

## 2019-02-07 DIAGNOSIS — Z981 Arthrodesis status: Secondary | ICD-10-CM | POA: Diagnosis not present

## 2019-02-07 DIAGNOSIS — Z79899 Other long term (current) drug therapy: Secondary | ICD-10-CM | POA: Diagnosis not present

## 2019-02-07 DIAGNOSIS — Z923 Personal history of irradiation: Secondary | ICD-10-CM | POA: Diagnosis not present

## 2019-02-07 DIAGNOSIS — Z96652 Presence of left artificial knee joint: Secondary | ICD-10-CM | POA: Diagnosis not present

## 2019-02-07 DIAGNOSIS — M1711 Unilateral primary osteoarthritis, right knee: Secondary | ICD-10-CM | POA: Diagnosis not present

## 2019-02-07 HISTORY — DX: Other specified postprocedural states: Z98.890

## 2019-02-07 LAB — CBC WITH DIFFERENTIAL/PLATELET
Abs Immature Granulocytes: 0.02 10*3/uL (ref 0.00–0.07)
Basophils Absolute: 0 10*3/uL (ref 0.0–0.1)
Basophils Relative: 0 %
Eosinophils Absolute: 0.1 10*3/uL (ref 0.0–0.5)
Eosinophils Relative: 2 %
HCT: 47.6 % (ref 39.0–52.0)
Hemoglobin: 15.8 g/dL (ref 13.0–17.0)
Immature Granulocytes: 0 %
Lymphocytes Relative: 16 %
Lymphs Abs: 0.9 10*3/uL (ref 0.7–4.0)
MCH: 31.6 pg (ref 26.0–34.0)
MCHC: 33.2 g/dL (ref 30.0–36.0)
MCV: 95.2 fL (ref 80.0–100.0)
Monocytes Absolute: 0.6 10*3/uL (ref 0.1–1.0)
Monocytes Relative: 11 %
Neutro Abs: 3.8 10*3/uL (ref 1.7–7.7)
Neutrophils Relative %: 71 %
Platelets: 250 10*3/uL (ref 150–400)
RBC: 5 MIL/uL (ref 4.22–5.81)
RDW: 12.4 % (ref 11.5–15.5)
WBC: 5.4 10*3/uL (ref 4.0–10.5)
nRBC: 0 % (ref 0.0–0.2)

## 2019-02-07 LAB — COMPREHENSIVE METABOLIC PANEL
ALT: 17 U/L (ref 0–44)
AST: 21 U/L (ref 15–41)
Albumin: 4.3 g/dL (ref 3.5–5.0)
Alkaline Phosphatase: 65 U/L (ref 38–126)
Anion gap: 9 (ref 5–15)
BUN: 13 mg/dL (ref 8–23)
CO2: 27 mmol/L (ref 22–32)
Calcium: 10 mg/dL (ref 8.9–10.3)
Chloride: 99 mmol/L (ref 98–111)
Creatinine, Ser: 0.95 mg/dL (ref 0.61–1.24)
GFR calc Af Amer: >60 mL/min (ref >60)
GFR calc non Af Amer: >60 mL/min (ref >60)
Glucose, Bld: 106 mg/dL — ABNORMAL HIGH (ref 70–99)
Potassium: 4.8 mmol/L (ref 3.5–5.1)
Sodium: 135 mmol/L (ref 135–145)
Total Bilirubin: 0.4 mg/dL (ref 0.3–1.2)
Total Protein: 7.6 g/dL (ref 6.5–8.1)

## 2019-02-07 LAB — GLUCOSE, CAPILLARY
Glucose-Capillary: 64 mg/dL — ABNORMAL LOW (ref 70–99)
Glucose-Capillary: 123 mg/dL — ABNORMAL HIGH (ref 70–99)

## 2019-02-07 LAB — SURGICAL PCR SCREEN
MRSA, PCR: NEGATIVE
Staphylococcus aureus: NEGATIVE

## 2019-02-07 LAB — ABO/RH: ABO/RH(D): A POS

## 2019-02-07 NOTE — Progress Notes (Signed)
Unable to void at preop  Due to urinary retention   Konrad Felix PA-C   Aware  Also spoke with pt. At preop rergarding recent neck surgery.

## 2019-02-08 NOTE — Progress Notes (Addendum)
Anesthesia Chart Review   Case: [id number] Date/Time: 02/16/19 0715   Procedure: TOTAL KNEE ARTHROPLASTY (Right )   Anesthesia type: Choice   Pre-op diagnosis: OA RIGHT KNEE   Location[facility] ROOM 05 / [facility]   Surgeon: Earlie Server, MD      DISCUSSION:68 y.o. 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 Earlie Server.   S/p ACDF 12/28/2018 by Dr. Newman Pies. Incisions well healed.  He reports he has been experiencing persistent swelling and difficulty swallowing and has lost significant weight since surgery.  He reports he is able to swallow water, but unable to swallow thick liquids like ensure.  He completed a prednisone dosepak last week with some improvement.  He denies difficulty breathing.    Pt also complains of difficulty urinating since the surgery.  He reports to nurse he has only been able to urinate once this week.  Denies abdominal pain, dysuria, hematuria, fever, chills, n/v/d.  Advised to contact [facility].  He has been seen by them in the past for similar issues.    Discussed with Dr. Roanna Banning.  Pt will need the ok to proceed with upcoming knee surgery from Dr. Arnoldo Morale due to recent ACDF and persistent trouble swallowing/swelling.  Unknown if patient has had a follow up since starting prednisone dosepak.  VM left with Dr. Alvester Morin scheduler.  Addendum 02/09/2019:  Clearance received from Dr. Arnoldo Morale which states, "Patient is cleared from neurosurgery stand point to have TKR."  VS: BP 118/77 (BP Location: Right Arm)   Pulse 100   Temp 37 C (Oral)   Resp 18   Ht 6\' 1"  (1.854 m)   Wt 69.5 kg   SpO2 100%   BMI 20.20 kg/m   PROVIDERS: Eston Esters, NP is PCP    LABS: Labs reviewed: Acceptable for surgery. (all labs ordered are listed,  but only abnormal results are displayed)  Labs Reviewed  GLUCOSE, CAPILLARY - Abnormal; Notable for the following components:      Result Value   Glucose-Capillary 64 (*)    All other components within normal limits  COMPREHENSIVE METABOLIC PANEL - Abnormal; Notable for the following components:   Glucose, Bld 106 (*)    All other components within normal limits  GLUCOSE, CAPILLARY - Abnormal; Notable for the following components:   Glucose-Capillary 123 (*)    All other components within normal limits  SURGICAL PCR SCREEN  URINE CULTURE  CBC WITH DIFFERENTIAL/PLATELET  TYPE AND SCREEN  ABO/RH     IMAGES:   EKG: 06/07/18 Rate 101 Sinus tachycardia  Probable left atrial enlargement   CV:  Past Medical History:  Diagnosis Date  . Alcohol abuse    until age 62  . Arthritis   . Bell's palsy 05/2017  . BPH (benign prostatic hyperplasia)   . Cervical spondylosis with myelopathy   . Chronic hepatitis C without hepatic coma ([place]) FOLLOWED BY INFECTOUS DISEASE DR COMER   POSITIVE ANTIBODY TEST THIS YEAR 2016--  CURRENT TX ON HARVONI PO- completeed 2016  . Chronic low back pain   . Collapsed lung    right .Marland Kitchen..15 yrs ago, fell and hit some bricks  . DDD (degenerative disc disease), lumbosacral   . Depression    no meds now- med used for bladder  . Diabetes mellitus without complication (  [place])    Type II  . ED (erectile dysfunction)   . Foley catheter in place    removed, post lumbar surgery- cath left in throughh rehab- had 2 UTI's  . Full dentures   . GERD (gastroesophageal reflux disease)    hx none in long time  . H/O neck surgery    back of neck spine anterior entry 12-26-18   . History of cocaine abuse ([place])    per pt quit and last used 06-03-2013  . History of radiation therapy 07/11/18- 09/01/18   Oropharynx/ treated to 60 Gy in 30 fractions of 2 Gy  . Lymphoma ([place])   . Urinary retention   . Wears glasses     Past Surgical History:  Procedure Laterality  Date  . ACDF    . ANTERIOR CERVICAL DECOMP/DISCECTOMY FUSION N/A 12/28/2018   Procedure: ANTERIOR CERVICAL DECOMPRESSION/DISCECTOMY Trinidad Curet PROSTHESIS,PLATE/SCREWS CERVICAL THREE- CERVICAL FOUR EXPLORE FUSION WITH REMOVAL OLD HARDWARE;  Surgeon: Newman Pies, MD;  Location: [facility];  Service: Neurosurgery;  Laterality: N/A;  . APPENDECTOMY  1980's  . BACK SURGERY     lower back x2  . [place]  . CLOSED REDUCTION AND INTRAMAXILLARY FIXATION BILATERAL MENTAL FX'S/  PLACEMENT MAXILLARY STENT/ APERTURE WIRE PLACEMENT  01-04-2000  . DIRECT LARYNGOSCOPY N/A 04/21/2018   Procedure: DIRECT LARYNGOSCOPY WITH BIOPSY OF TONGUE BASE AND NASOPHARYNX;  Surgeon: Jerrell Belfast, MD;  Location: [facility];  Service: ENT;  Laterality: N/A;  Direct laryngoscopy with biopsy of tongue base and nasopharynx  . JOINT REPLACEMENT    . MULTIPLE TOOTH EXTRACTIONS    . POSTERIOR LUMBAR FUSION  03-05-2015   laminectomy /  nerve decompression--  L4 -- S1  . RADICAL NECK DISSECTION Right 05/31/2018   Procedure: RADICAL MODIFIED RADICAL NECK DISSECTION;  Surgeon: Jerrell Belfast, MD;  Location: [place];  Service: ENT;  Laterality: Right;  . RADIOLOGY WITH ANESTHESIA N/A 08/08/2018   Procedure: MRI CERVICAL SPINE WITHOUT CONTRAST;  Surgeon: Radiologist, Medication, MD;  Location: [facility];  Service: Radiology;  Laterality: N/A;  . TONSILLECTOMY  04/21/2018   Procedure: RIGHT TONSILLECTOMY;  Surgeon: Jerrell Belfast, MD;  Location: [place] [facility];  Service: ENT;;  . TONSILLECTOMY    . TOTAL KNEE ARTHROPLASTY Left 09/21/2013   Procedure: LEFT TOTAL KNEE ARTHROPLASTY;  Surgeon: Yvette Rack., MD;  Location: [facility];  Service: Orthopedics;  Laterality: Left;  . TRANSURETHRAL RESECTION OF PROSTATE N/A 06/16/2015   Procedure: TRANSURETHRAL RESECTION OF THE PROSTATE WITH GYRUS INSTRUMENTS;  Surgeon: Franchot Gallo, MD;  Location: [facility];  Service: Urology;  Laterality: N/A;    MEDICATIONS: .  atorvastatin (LIPITOR) 20 MG tablet  . cyclobenzaprine (FLEXERIL) 10 MG tablet  . docusate sodium (COLACE) 100 MG capsule  . gabapentin (NEURONTIN) 300 MG capsule  . lidocaine (XYLOCAINE) 2 % solution  . metFORMIN (GLUMETZA) 1000 MG (MOD) 24 hr tablet  . Multiple Vitamins-Minerals (MULTIVITAMIN WITH IRON-MINERALS) liquid  . oxyCODONE 10 MG TABS  . oxyCODONE-acetaminophen (PERCOCET) 10-325 MG tablet  . pantoprazole (PROTONIX) 40 MG tablet  . sodium fluoride (PREVIDENT 5000 PLUS) 1.1 % CREA dental cream  . sucralfate (CARAFATE) 1 g tablet   No current facility-administered medications for this encounter.    Marland Kitchen 0.9 %  sodium chloride infusion  . ondansetron [facility]) injection 4 mg     Maia Plan [facility] Pre-Surgical Testing [phone] 02/08/19  3:19 PM

## 2019-02-09 NOTE — Anesthesia Preprocedure Evaluation (Deleted)
Anesthesia Evaluation  General Assessment Comment:68 y.o. 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 Earlie Server.  Reviewed: Allergy & Precautions, Patient's Chart, lab work & pertinent test results  Airway Mallampati: II  TM Distance: >3 FB Neck ROM: Limited    Dental  (+) Dental Advisory Given, Missing   Pulmonary neg pulmonary ROS,    Pulmonary exam normal breath sounds clear to auscultation       Cardiovascular Exercise Tolerance: Good negative cardio ROS Normal cardiovascular exam Rhythm:Regular Rate:Normal     Neuro/Psych PSYCHIATRIC DISORDERS Depression S/p ACDF  Neuromuscular disease    GI/Hepatic GERD  Medicated,(+)     substance abuse  alcohol use and cocaine use, Hepatitis -, C  Endo/Other  diabetes, Type 2, Oral Hypoglycemic Agents  Renal/GU negative Renal ROS     Musculoskeletal  (+) Arthritis , Osteoarthritis,    Abdominal   Peds  Hematology negative hematology ROS (+) Plt 250k   Anesthesia Other Findings Day of surgery medications reviewed with the patient.  Reproductive/Obstetrics                           Anesthesia Physical Anesthesia Plan  ASA: III  Anesthesia Plan: General   Post-op Pain Management:    Induction: Intravenous  PONV Risk Score and Plan: 3 and Midazolam, Diphenhydramine, Dexamethasone and Ondansetron  Airway Management Planned: Oral ETT  Additional Equipment:   Intra-op Plan:   Post-operative Plan: Extubation in OR  Informed Consent: I have reviewed the patients History and Physical, chart, labs and discussed the procedure including the risks, benefits and alternatives for the proposed anesthesia with the patient or authorized  representative who has indicated his/her understanding and acceptance.     Dental advisory given  Plan Discussed with: CRNA  Anesthesia Plan Comments: (See PAT note 02/07/2019, Konrad Felix, PA-C)      Anesthesia Quick Evaluation

## 2019-02-13 ENCOUNTER — Other Ambulatory Visit (HOSPITAL_COMMUNITY)
Admission: RE | Admit: 2019-02-13 | Discharge: 2019-02-13 | Disposition: A | Discharge status: Home / Self Care | Payer: Medicare HMO | Source: Ambulatory Visit | Attending: Orthopedic Surgery | Admitting: Orthopedic Surgery

## 2019-02-13 DIAGNOSIS — Z1159 Encounter for screening for other viral diseases: Secondary | ICD-10-CM | POA: Insufficient documentation

## 2019-02-13 LAB — SARS CORONAVIRUS 2 (TAT 6-24 HRS): SARS Coronavirus 2: NEGATIVE

## 2019-02-15 MED ORDER — TRANEXAMIC ACID 1000 MG/10ML IV SOLN
2000.0000 mg | INTRAVENOUS | Status: AC
  Filled 2019-02-15: qty 20

## 2019-02-15 MED ORDER — BUPIVACAINE LIPOSOME 1.3 % IJ SUSP
20.0000 mL | INTRAMUSCULAR | Status: AC
  Filled 2019-02-15: qty 20

## 2019-02-16 ENCOUNTER — Encounter (HOSPITAL_COMMUNITY): Payer: Self-pay | Admitting: *Deleted

## 2019-02-16 ENCOUNTER — Encounter (HOSPITAL_COMMUNITY)
Admission: RE | Disposition: A | Discharge status: Home Health | Payer: Self-pay | Source: Ambulatory Visit | Attending: Orthopedic Surgery

## 2019-02-16 DIAGNOSIS — Z923 Personal history of irradiation: Secondary | ICD-10-CM | POA: Diagnosis not present

## 2019-02-16 DIAGNOSIS — Z7984 Long term (current) use of oral hypoglycemic drugs: Secondary | ICD-10-CM | POA: Diagnosis not present

## 2019-02-16 DIAGNOSIS — M545 Low back pain: Secondary | ICD-10-CM | POA: Diagnosis not present

## 2019-02-16 DIAGNOSIS — Z8572 Personal history of non-Hodgkin lymphomas: Secondary | ICD-10-CM | POA: Insufficient documentation

## 2019-02-16 DIAGNOSIS — Z5309 Procedure and treatment not carried out because of other contraindication: Secondary | ICD-10-CM | POA: Insufficient documentation

## 2019-02-16 DIAGNOSIS — E119 Type 2 diabetes mellitus without complications: Secondary | ICD-10-CM | POA: Insufficient documentation

## 2019-02-16 DIAGNOSIS — Z8619 Personal history of other infectious and parasitic diseases: Secondary | ICD-10-CM | POA: Insufficient documentation

## 2019-02-16 DIAGNOSIS — M1711 Unilateral primary osteoarthritis, right knee: Secondary | ICD-10-CM | POA: Diagnosis present

## 2019-02-16 DIAGNOSIS — Z79899 Other long term (current) drug therapy: Secondary | ICD-10-CM | POA: Diagnosis not present

## 2019-02-16 DIAGNOSIS — G8929 Other chronic pain: Secondary | ICD-10-CM | POA: Insufficient documentation

## 2019-02-16 DIAGNOSIS — Z20828 Contact with and (suspected) exposure to other viral communicable diseases: Secondary | ICD-10-CM | POA: Insufficient documentation

## 2019-02-16 LAB — URINALYSIS, ROUTINE W REFLEX MICROSCOPIC
Bilirubin Urine: NEGATIVE
Glucose, UA: NEGATIVE mg/dL
Hgb urine dipstick: NEGATIVE
Ketones, ur: 5 mg/dL — AB
Leukocytes,Ua: NEGATIVE
Nitrite: NEGATIVE
Protein, ur: NEGATIVE mg/dL
Specific Gravity, Urine: 1.013 (ref 1.005–1.030)
pH: 7 (ref 5.0–8.0)

## 2019-02-16 LAB — GLUCOSE, CAPILLARY: Glucose-Capillary: 120 mg/dL — ABNORMAL HIGH (ref 70–99)

## 2019-02-16 LAB — TYPE AND SCREEN
ABO/RH(D): A POS
Antibody Screen: NEGATIVE

## 2019-02-16 SURGERY — ARTHROPLASTY, KNEE, TOTAL
Anesthesia: General | Laterality: Right

## 2019-02-16 MED ORDER — METOPROLOL TARTRATE 5 MG/5ML IV SOLN
INTRAVENOUS | Status: AC
  Filled 2019-02-16: qty 5

## 2019-02-16 MED ORDER — CHLORHEXIDINE GLUCONATE 4 % EX LIQD: 60.0000 mL | Freq: Once | CUTANEOUS | Status: DC

## 2019-02-16 MED ORDER — GABAPENTIN 300 MG PO CAPS
300.0000 mg | ORAL_CAPSULE | Freq: Once | ORAL | Status: AC
  Administered 2019-02-16: 06:00:00 300 mg via ORAL
  Filled 2019-02-16: qty 1

## 2019-02-16 MED ORDER — MIDAZOLAM HCL 2 MG/2ML IJ SOLN
INTRAMUSCULAR | Status: AC
  Filled 2019-02-16: qty 2

## 2019-02-16 MED ORDER — FENTANYL CITRATE (PF) 100 MCG/2ML IJ SOLN
INTRAMUSCULAR | Status: AC
  Filled 2019-02-16: qty 2

## 2019-02-16 MED ORDER — ACETAMINOPHEN 500 MG PO TABS
1000.0000 mg | ORAL_TABLET | Freq: Once | ORAL | Status: AC
  Administered 2019-02-16: 06:00:00 1000 mg via ORAL
  Filled 2019-02-16: qty 2

## 2019-02-16 MED ORDER — POVIDONE-IODINE 10 % EX SWAB
2.0000 "application " | Freq: Once | CUTANEOUS | Status: AC
  Administered 2019-02-16: 2 via TOPICAL

## 2019-02-16 MED ORDER — ROCURONIUM BROMIDE 10 MG/ML (PF) SYRINGE
PREFILLED_SYRINGE | INTRAVENOUS | Status: AC
  Filled 2019-02-16: qty 10

## 2019-02-16 MED ORDER — SUCCINYLCHOLINE CHLORIDE 200 MG/10ML IV SOSY
PREFILLED_SYRINGE | INTRAVENOUS | Status: AC
  Filled 2019-02-16: qty 10

## 2019-02-16 MED ORDER — LIDOCAINE 2% (20 MG/ML) 5 ML SYRINGE
INTRAMUSCULAR | Status: AC
  Filled 2019-02-16: qty 5

## 2019-02-16 MED ORDER — SODIUM CHLORIDE 0.9 % IV SOLN: INTRAVENOUS | Status: DC

## 2019-02-16 MED ORDER — CELECOXIB 200 MG PO CAPS
200.0000 mg | ORAL_CAPSULE | Freq: Once | ORAL | Status: AC
  Administered 2019-02-16: 200 mg via ORAL
  Filled 2019-02-16: qty 1

## 2019-02-16 MED ORDER — LACTATED RINGERS IV SOLN
INTRAVENOUS | Status: DC
  Administered 2019-02-16: 06:00:00 via INTRAVENOUS

## 2019-02-16 MED ORDER — CEFAZOLIN SODIUM-DEXTROSE 2-4 GM/100ML-% IV SOLN
2.0000 g | INTRAVENOUS | Status: AC
  Filled 2019-02-16: qty 100

## 2019-02-16 MED ORDER — SUGAMMADEX SODIUM 500 MG/5ML IV SOLN
INTRAVENOUS | Status: AC
  Filled 2019-02-16: qty 5

## 2019-02-16 MED ORDER — PROPOFOL 10 MG/ML IV BOLUS
INTRAVENOUS | Status: AC
  Filled 2019-02-16: qty 60

## 2019-02-16 MED ORDER — TRANEXAMIC ACID-NACL 1000-0.7 MG/100ML-% IV SOLN
1000.0000 mg | INTRAVENOUS | Status: AC
  Filled 2019-02-16: qty 100

## 2019-02-16 NOTE — Progress Notes (Signed)
Pt states he had carb loading drink at 0430 today. Once clarified with Dr. Gifford Shave, pt had dairy based carb loading drink. This pushed pt back until 1000 for procedure to start. Dr. French Ana will be unable to do surgery at 1000 today. Pt made aware. Understanding.

## 2019-02-17 DIAGNOSIS — Z7984 Long term (current) use of oral hypoglycemic drugs: Secondary | ICD-10-CM | POA: Diagnosis not present

## 2019-02-17 DIAGNOSIS — Z79899 Other long term (current) drug therapy: Secondary | ICD-10-CM | POA: Diagnosis not present

## 2019-02-17 DIAGNOSIS — Z923 Personal history of irradiation: Secondary | ICD-10-CM | POA: Diagnosis not present

## 2019-02-17 DIAGNOSIS — Z8572 Personal history of non-Hodgkin lymphomas: Secondary | ICD-10-CM | POA: Diagnosis not present

## 2019-02-17 DIAGNOSIS — M1711 Unilateral primary osteoarthritis, right knee: Secondary | ICD-10-CM | POA: Diagnosis present

## 2019-02-17 DIAGNOSIS — Z8619 Personal history of other infectious and parasitic diseases: Secondary | ICD-10-CM | POA: Diagnosis not present

## 2019-02-17 DIAGNOSIS — G8929 Other chronic pain: Secondary | ICD-10-CM | POA: Diagnosis not present

## 2019-02-17 DIAGNOSIS — Z5309 Procedure and treatment not carried out because of other contraindication: Secondary | ICD-10-CM | POA: Diagnosis not present

## 2019-02-17 DIAGNOSIS — E119 Type 2 diabetes mellitus without complications: Secondary | ICD-10-CM | POA: Diagnosis not present

## 2019-02-17 DIAGNOSIS — M545 Low back pain: Secondary | ICD-10-CM | POA: Diagnosis not present

## 2019-02-17 DIAGNOSIS — Z20828 Contact with and (suspected) exposure to other viral communicable diseases: Secondary | ICD-10-CM | POA: Diagnosis not present

## 2019-02-17 LAB — URINE CULTURE: Culture: NO GROWTH

## 2019-02-27 ENCOUNTER — Ambulatory Visit (HOSPITAL_COMMUNITY): Payer: Medicare HMO

## 2019-02-27 ENCOUNTER — Other Ambulatory Visit: Payer: Self-pay | Admitting: Orthopedic Surgery

## 2019-02-27 DIAGNOSIS — M1711 Unilateral primary osteoarthritis, right knee: Secondary | ICD-10-CM | POA: Diagnosis not present

## 2019-02-28 LAB — SARS CORONAVIRUS 2 (TAT 6-24 HRS): SARS Coronavirus 2: NEGATIVE

## 2019-02-28 NOTE — Patient Instructions (Addendum)
DUE TO COVID-19 ONLY ONE VISITOR IS ALLOWED IN [place]. THEY WILL REMAIN ONLY IN THE WAITING ROOM DAY OF SURGERY     COVID SWAB TESTING COMPLETED ON: February 27, 2019 (Must self quarantine after testing. Follow instructions on handout.)             Your procedure is scheduled on: Friday, March 02, 2019   Report to [facility] Main  Entrance   Report to Short Stay at 5:30 AM    Call this number if you have problems the morning of surgery [phone]   Do not eat food or drink liquids :After Midnight.   Brush your teeth the morning of surgery.    Take these medicines the morning of surgery with A SIP OF WATER: Gabapentin, Pantoprazole   DO NOT TAKE ANY DIABETES MEDICATION THE DAY OF SURGERY! PLEASE CHECK YOUR BLOOD SUGAR THE DAY OF SURGERY. REPORT TO YOUR NURSE ON ARRIVAL.  IF YOUR BLOOD SUGAR IS LESS THAN 70 THE MORNING SURGERY WHEN YOU CHECK IT AT HOME, TREAT YOURSELF WITH HALF CUP OF APPLE JUICE AND RECHECK AFTER 15 MINUTES. IF IT REMAINS LESS THAN 70 AFTER TREATMENT, YOU NEED TO CALL THE [phone] FOR FURTHER INSTRUCTIONS!                                  You may not have any metal on your body including jewelry, and body piercings             Do not wear lotions, powders, perfumes/cologne, or deodorant                           Men may shave face and neck.   Do not bring valuables to the hospital. [place].   Contacts, dentures or bridgework may not be worn into surgery.   Bring small overnight bag day of surgery.                Please read over the following fact sheets you were given:  [facility] - Preparing for Surgery Before surgery, you can play an important role.  Because skin is not sterile, your skin needs to be as free of germs as possible.  You can reduce the number of germs on your skin by washing with CHG (chlorahexidine gluconate) soap before surgery.  CHG is an  antiseptic cleaner which kills germs and bonds with the skin to continue killing germs even after washing. Please DO NOT use if you have an allergy to CHG or antibacterial soaps.  If your skin becomes reddened/irritated stop using the CHG and inform your nurse when you arrive at Short Stay. Do not shave (including legs and underarms) for at least 48 hours prior to the first CHG shower.  You may shave your face/neck.  Please follow these instructions carefully:  1.  Shower with CHG Soap the night before surgery and the  morning of surgery.  2.  If you choose to wash your hair, wash your hair first as usual with your normal  shampoo.  3.  After you shampoo, rinse your hair and body thoroughly to remove the shampoo.  4.  Use CHG as you would any other liquid soap.  You can apply chg directly to the skin and wash.  Gently with a scrungie or clean washcloth.  5.  Apply the CHG Soap to your body ONLY FROM THE NECK DOWN.   Do   not use on face/ open                           Wound or open sores. Avoid contact with eyes, ears mouth and   genitals (private parts).                       Wash face,  Genitals (private parts) with your normal soap.             6.  Wash thoroughly, paying special attention to the area where your    surgery  will be performed.  7.  Thoroughly rinse your body with warm water from the neck down.  8.  DO NOT shower/wash with your normal soap after using and rinsing off the CHG Soap.                9.  Pat yourself dry with a clean towel.            10.  Wear clean pajamas.            11.  Place clean sheets on your bed the night of your first shower and do not  sleep with pets. Day of Surgery : Do not apply any lotions/deodorants the morning of surgery.  Please wear clean clothes to the hospital/surgery center.  FAILURE TO FOLLOW THESE INSTRUCTIONS MAY RESULT IN THE CANCELLATION OF YOUR SURGERY  PATIENT  SIGNATURE_________________________________  NURSE SIGNATURE__________________________________  ________________________________________________________________________

## 2019-03-01 ENCOUNTER — Ambulatory Visit: Payer: Self-pay | Admitting: Physician Assistant

## 2019-03-01 ENCOUNTER — Encounter (HOSPITAL_COMMUNITY)
Admission: RE | Admit: 2019-03-01 | Discharge: 2019-03-01 | Disposition: A | Discharge status: Home / Self Care | Payer: Medicare HMO | Source: Ambulatory Visit | Attending: Orthopedic Surgery | Admitting: Orthopedic Surgery

## 2019-03-01 ENCOUNTER — Encounter (HOSPITAL_COMMUNITY): Payer: Self-pay

## 2019-03-01 ENCOUNTER — Other Ambulatory Visit: Payer: Self-pay

## 2019-03-01 DIAGNOSIS — M1711 Unilateral primary osteoarthritis, right knee: Secondary | ICD-10-CM | POA: Diagnosis not present

## 2019-03-01 HISTORY — DX: Unspecified hearing loss, right ear: H91.91

## 2019-03-01 LAB — CBC
HCT: 48.3 % (ref 39.0–52.0)
Hemoglobin: 15.8 g/dL (ref 13.0–17.0)
MCH: 31.2 pg (ref 26.0–34.0)
MCHC: 32.7 g/dL (ref 30.0–36.0)
MCV: 95.5 fL (ref 80.0–100.0)
Platelets: 245 10*3/uL (ref 150–400)
RBC: 5.06 MIL/uL (ref 4.22–5.81)
RDW: 12.5 % (ref 11.5–15.5)
WBC: 6 10*3/uL (ref 4.0–10.5)
nRBC: 0 % (ref 0.0–0.2)

## 2019-03-01 LAB — COMPREHENSIVE METABOLIC PANEL
ALT: 12 U/L (ref 0–44)
AST: 17 U/L (ref 15–41)
Albumin: 4.1 g/dL (ref 3.5–5.0)
Alkaline Phosphatase: 62 U/L (ref 38–126)
Anion gap: 9 (ref 5–15)
BUN: 11 mg/dL (ref 8–23)
CO2: 28 mmol/L (ref 22–32)
Calcium: 10 mg/dL (ref 8.9–10.3)
Chloride: 101 mmol/L (ref 98–111)
Creatinine, Ser: 0.88 mg/dL (ref 0.61–1.24)
GFR calc Af Amer: >60 mL/min (ref >60)
GFR calc non Af Amer: >60 mL/min (ref >60)
Glucose, Bld: 107 mg/dL — ABNORMAL HIGH (ref 70–99)
Potassium: 5.2 mmol/L — ABNORMAL HIGH (ref 3.5–5.1)
Sodium: 138 mmol/L (ref 135–145)
Total Bilirubin: 0.3 mg/dL (ref 0.3–1.2)
Total Protein: 7.6 g/dL (ref 6.5–8.1)

## 2019-03-01 LAB — SURGICAL PCR SCREEN
MRSA, PCR: NEGATIVE
Staphylococcus aureus: NEGATIVE

## 2019-03-01 LAB — GLUCOSE, CAPILLARY: Glucose-Capillary: 85 mg/dL (ref 70–99)

## 2019-03-01 LAB — HEMOGLOBIN A1C
Hgb A1c MFr Bld: 5.9 % — ABNORMAL HIGH (ref 4.8–5.6)
Mean Plasma Glucose: 122.63 mg/dL

## 2019-03-01 NOTE — Progress Notes (Signed)
Anesthesia Chart Review   Case: [id number] Date/Time: 03/02/19 0714   Procedure: RIGHT TOTAL KNEE ARTHROPLASTY (Right Knee)   Anesthesia type: Choice   Pre-op diagnosis: OA RIGHT KNEE   [facility] ROOM 07 / [facility]   Surgeon: Earlie Server, MD      DISCUSSION:68 y.o. 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 Earlie Server.   S/p ACDF 12/28/2018 by Dr. Newman Pies. Incision well healed.  He reports continued difficulty with swallowing.  At last visit reported he had trouble with thick liquids.  He can now tolerate thick liquids and some solids.  He denies difficulty breathing.  Clearance received from Dr. Arnoldo Morale which states, "Patient is cleared from neurosurgery stand point to have TKR."   When seen last pt complained of difficulty urinating since ACDF.  Previously advised to contact urology.  He reports this problem has now resolved.   Previously discussed with Dr. Roanna Banning.  Surgery had been scheduled for 02/16/2019, cancelled morning of due to patient drinking Ensure, surgeon could not push the case to later time that day.   Anticipate pt can proceed with planned procedure barring acute status change.   VS: BP (!) 140/93   Pulse 80   Temp 37 C (Oral)   Resp 16   Ht 6\' 1"  (1.854 m)   Wt 70.8 kg   SpO2 100%   BMI 20.58 kg/m   PROVIDERS: Eston Esters, NP is PCP    LABS: Labs reviewed: Acceptable for surgery. (all labs ordered are listed, but only abnormal results are displayed)  Labs Reviewed  SURGICAL PCR SCREEN  CBC  HEMOGLOBIN A1C  COMPREHENSIVE METABOLIC PANEL     IMAGES:   EKG: 06/07/18 Rate 101 Sinus tachycardia  Probable left atrial enlargement   CV:  Past Medical History:  Diagnosis Date  . Alcohol abuse    until  age 32  . Arthritis   . Bell's palsy 05/2017  . BPH (benign prostatic hyperplasia)   . Cervical spondylosis with myelopathy   . 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 Chronic hepatitis C without hepatic coma ([place]) FOLLOWED BY INFECTOUS DISEASE DR COMER   POSITIVE ANTIBODY TEST THIS YEAR 2016--  CURRENT TX ON HARVONI PO- completeed 2016  . Chronic low back pain   . Collapsed lung    right .Marland Kitchen..15 yrs ago, fell and hit some bricks  . DDD (degenerative disc disease), lumbosacral   . Depression    no meds now- med used for bladder  . Diabetes mellitus without complication (HCC)    Type II, does not check cbg at home   . ED (erectile dysfunction)   . Foley catheter in place    removed, post lumbar surgery- cath left in throughh rehab- had 2 UTI's  . Full dentures   . GERD (gastroesophageal reflux disease)    hx none in long time  . H/O neck surgery    back of neck spine anterior entry 12-26-18 , 03-01-2019 reports s/p neck swelling of surgical site  that impair swallowing , patiwnr states "i will swallow and food with come beack up and come up into my nose "   . History of cocaine abuse ([place])    per pt quit and last  used 06-03-2013  . History of radiation therapy 07/11/18- 09/01/18   Oropharynx/ treated to 60 Gy in 30 fractions of 2 Gy  . Lymphoma ([place])   . Urinary retention   . Wears glasses     Past Surgical History:  Procedure Laterality Date  . ACDF    . ANTERIOR CERVICAL DECOMP/DISCECTOMY FUSION N/A 12/28/2018   Procedure: ANTERIOR CERVICAL DECOMPRESSION/DISCECTOMY Trinidad Curet PROSTHESIS,PLATE/SCREWS CERVICAL THREE- CERVICAL FOUR EXPLORE FUSION WITH REMOVAL OLD HARDWARE;  Surgeon: Newman Pies, MD;  Location: [place];  Service: Neurosurgery;  Laterality: N/A;  . APPENDECTOMY  1980's  . BACK SURGERY     lower back x2  . [place]  . CLOSED REDUCTION AND INTRAMAXILLARY FIXATION BILATERAL MENTAL FX'S/   PLACEMENT MAXILLARY STENT/ APERTURE WIRE PLACEMENT  01-04-2000  . DIRECT LARYNGOSCOPY N/A 04/21/2018   Procedure: DIRECT LARYNGOSCOPY WITH BIOPSY OF TONGUE BASE AND NASOPHARYNX;  Surgeon: Jerrell Belfast, MD;  Location: [place];  Service: ENT;  Laterality: N/A;  Direct laryngoscopy with biopsy of tongue base and nasopharynx  . JOINT REPLACEMENT    . MULTIPLE TOOTH EXTRACTIONS    . POSTERIOR LUMBAR FUSION  03-05-2015   laminectomy /  nerve decompression--  L4 -- S1  . RADICAL NECK DISSECTION Right 05/31/2018   Procedure: RADICAL MODIFIED RADICAL NECK DISSECTION;  Surgeon: Jerrell Belfast, MD;  Location: [place];  Service: ENT;  Laterality: Right;  . RADIOLOGY WITH ANESTHESIA N/A 08/08/2018   Procedure: MRI CERVICAL SPINE WITHOUT CONTRAST;  Surgeon: Radiologist, Medication, MD;  Location: [place];  Service: Radiology;  Laterality: N/A;  . TONSILLECTOMY  04/21/2018   Procedure: RIGHT TONSILLECTOMY;  Surgeon: Jerrell Belfast, MD;  Location: [place];  Service: ENT;;  . TONSILLECTOMY    . TOTAL KNEE ARTHROPLASTY Left 09/21/2013   Procedure: LEFT TOTAL KNEE ARTHROPLASTY;  Surgeon: Yvette Rack., MD;  Location: [place];  Service: Orthopedics;  Laterality: Left;  . TRANSURETHRAL RESECTION OF PROSTATE N/A 06/16/2015   Procedure: TRANSURETHRAL RESECTION OF THE PROSTATE WITH GYRUS INSTRUMENTS;  Surgeon: Franchot Gallo, MD;  Location[facility];  Service: Urology;  Laterality: N/A;    MEDICATIONS: No current facility-administered medications for this encounter.    No current outpatient medications on file.   Marland Kitchen 0.9 %  sodium chloride infusion  . 0.9 %  sodium chloride infusion  . chlorhexidine (HIBICLENS) 4 % liquid 4 application  . chlorhexidine (HIBICLENS) 4 % liquid 4 application  . lactated ringers infusion  . ondansetron [facility]) injection 4 mg    Maia Plan [facility] Pre-Surgical Testing [phone] 03/01/19  2:17 PM

## 2019-03-01 NOTE — Anesthesia Preprocedure Evaluation (Addendum)
Anesthesia Evaluation  Patient identified by MRN, date of birth, ID band Patient awake    Reviewed: Allergy & Precautions, NPO status , Patient's Chart, lab work & pertinent test results  History of Anesthesia Complications Negative for: history of anesthetic complications  Airway Mallampati: II  TM Distance: >3 FB Neck ROM: Limited    Dental  (+) Missing, Poor Dentition   Pulmonary neg pulmonary ROS,    Pulmonary exam normal        Cardiovascular negative cardio ROS Normal cardiovascular exam     Neuro/Psych PSYCHIATRIC DISORDERS Depression    GI/Hepatic GERD  ,(+)     substance abuse (past)  alcohol use and cocaine use, Hepatitis - (treated), C  Endo/Other  diabetes, Type 2  Renal/GU negative Renal ROS  negative genitourinary   Musculoskeletal  (+) Arthritis , Osteoarthritis,    Abdominal   Peds  Hematology negative hematology ROS (+)   Anesthesia Other Findings H&N cancer s/p resection/radiation  Reproductive/Obstetrics                         Anesthesia Physical Anesthesia Plan  ASA: III  Anesthesia Plan: General   Post-op Pain Management:    Induction: Intravenous  PONV Risk Score and Plan: 2 and Treatment may vary due to age or medical condition, Ondansetron, Dexamethasone and Midazolam  Airway Management Planned: Oral ETT and Video Laryngoscope Planned  Additional Equipment: None  Intra-op Plan:   Post-operative Plan: Extubation in OR  Informed Consent: I have reviewed the patients History and Physical, chart, labs and discussed the procedure including the risks, benefits and alternatives for the proposed anesthesia with the patient or authorized representative who has indicated his/her understanding and acceptance.     Dental advisory given  Plan Discussed with:   Anesthesia Plan Comments: (See PAT note 03/01/2019, Konrad Felix, PA-C)    Anesthesia Quick  Evaluation

## 2019-03-02 ENCOUNTER — Ambulatory Visit (HOSPITAL_COMMUNITY): Payer: Medicare HMO | Admitting: Physician Assistant

## 2019-03-02 ENCOUNTER — Ambulatory Visit (HOSPITAL_COMMUNITY): Admission: RE | Admit: 2019-03-02 | Payer: Medicare HMO | Source: Home / Self Care | Admitting: Orthopedic Surgery

## 2019-03-02 ENCOUNTER — Encounter (HOSPITAL_COMMUNITY): Payer: Self-pay | Admitting: *Deleted

## 2019-03-02 ENCOUNTER — Ambulatory Visit (HOSPITAL_COMMUNITY): Payer: Medicare HMO | Admitting: Anesthesiology

## 2019-03-02 ENCOUNTER — Encounter (HOSPITAL_COMMUNITY)
Admission: RE | Disposition: A | Discharge status: Home Health | Payer: Self-pay | Source: Ambulatory Visit | Attending: Orthopedic Surgery

## 2019-03-02 ENCOUNTER — Observation Stay (HOSPITAL_COMMUNITY)
Admission: RE | Admit: 2019-03-02 | Discharge: 2019-03-03 | Disposition: A | Discharge status: Home Health | Payer: Medicare HMO | Source: Ambulatory Visit | Attending: Orthopedic Surgery | Admitting: Orthopedic Surgery

## 2019-03-02 DIAGNOSIS — Z79899 Other long term (current) drug therapy: Secondary | ICD-10-CM | POA: Diagnosis not present

## 2019-03-02 DIAGNOSIS — Z5309 Procedure and treatment not carried out because of other contraindication: Secondary | ICD-10-CM | POA: Diagnosis not present

## 2019-03-02 DIAGNOSIS — Z923 Personal history of irradiation: Secondary | ICD-10-CM | POA: Diagnosis not present

## 2019-03-02 DIAGNOSIS — M545 Low back pain: Secondary | ICD-10-CM | POA: Insufficient documentation

## 2019-03-02 DIAGNOSIS — Z8619 Personal history of other infectious and parasitic diseases: Secondary | ICD-10-CM | POA: Insufficient documentation

## 2019-03-02 DIAGNOSIS — Z20828 Contact with and (suspected) exposure to other viral communicable diseases: Secondary | ICD-10-CM | POA: Insufficient documentation

## 2019-03-02 DIAGNOSIS — G8929 Other chronic pain: Secondary | ICD-10-CM | POA: Diagnosis not present

## 2019-03-02 DIAGNOSIS — M1711 Unilateral primary osteoarthritis, right knee: Secondary | ICD-10-CM | POA: Diagnosis present

## 2019-03-02 DIAGNOSIS — Z8572 Personal history of non-Hodgkin lymphomas: Secondary | ICD-10-CM | POA: Insufficient documentation

## 2019-03-02 DIAGNOSIS — E119 Type 2 diabetes mellitus without complications: Secondary | ICD-10-CM | POA: Insufficient documentation

## 2019-03-02 DIAGNOSIS — Z7984 Long term (current) use of oral hypoglycemic drugs: Secondary | ICD-10-CM | POA: Insufficient documentation

## 2019-03-02 HISTORY — PX: TOTAL KNEE ARTHROPLASTY: SHX125

## 2019-03-02 LAB — TYPE AND SCREEN
ABO/RH(D): A POS
Antibody Screen: NEGATIVE

## 2019-03-02 LAB — GLUCOSE, CAPILLARY
Glucose-Capillary: 93 mg/dL (ref 70–99)
Glucose-Capillary: 93 mg/dL (ref 70–99)
Glucose-Capillary: 138 mg/dL — ABNORMAL HIGH (ref 70–99)
Glucose-Capillary: 81 mg/dL (ref 70–99)

## 2019-03-02 SURGERY — ARTHROPLASTY, KNEE, TOTAL
Anesthesia: General | Site: Knee | Laterality: Right

## 2019-03-02 MED ORDER — FENTANYL CITRATE (PF) 100 MCG/2ML IJ SOLN: 25.0000 ug | INTRAMUSCULAR | Status: DC | PRN

## 2019-03-02 MED ORDER — PROMETHAZINE HCL 25 MG/ML IJ SOLN: 6.2500 mg | INTRAMUSCULAR | Status: DC | PRN

## 2019-03-02 MED ORDER — SUGAMMADEX SODIUM 200 MG/2ML IV SOLN
INTRAVENOUS | Status: DC | PRN
  Administered 2019-03-02: 150 mg via INTRAVENOUS

## 2019-03-02 MED ORDER — DIPHENHYDRAMINE HCL 12.5 MG/5ML PO ELIX: 12.5000 mg | ORAL_SOLUTION | ORAL | Status: DC | PRN

## 2019-03-02 MED ORDER — CEFAZOLIN SODIUM-DEXTROSE 1-4 GM/50ML-% IV SOLN
1.0000 g | Freq: Four times a day (QID) | INTRAVENOUS | Status: AC
  Administered 2019-03-02 (×2): 1 g via INTRAVENOUS
  Filled 2019-03-02 (×2): qty 50

## 2019-03-02 MED ORDER — EPHEDRINE SULFATE-NACL 50-0.9 MG/10ML-% IV SOSY
PREFILLED_SYRINGE | INTRAVENOUS | Status: DC | PRN
  Administered 2019-03-02: 5 mg via INTRAVENOUS
  Administered 2019-03-02: 10 mg via INTRAVENOUS
  Administered 2019-03-02 (×2): 5 mg via INTRAVENOUS

## 2019-03-02 MED ORDER — CYCLOBENZAPRINE HCL 10 MG PO TABS
10.0000 mg | ORAL_TABLET | Freq: Three times a day (TID) | ORAL | Status: DC | PRN
  Administered 2019-03-02 – 2019-03-03 (×3): 10 mg via ORAL
  Filled 2019-03-02 (×3): qty 1

## 2019-03-02 MED ORDER — OXYCODONE HCL 5 MG/5ML PO SOLN: 5.0000 mg | Freq: Once | ORAL | Status: DC | PRN

## 2019-03-02 MED ORDER — BUPIVACAINE LIPOSOME 1.3 % IJ SUSP
10.0000 mL | Freq: Once | INTRAMUSCULAR | Status: DC
  Filled 2019-03-02 (×2): qty 10

## 2019-03-02 MED ORDER — LACTATED RINGERS IV SOLN
INTRAVENOUS | Status: DC
  Administered 2019-03-02: 07:00:00 via INTRAVENOUS

## 2019-03-02 MED ORDER — SODIUM CHLORIDE 0.9% FLUSH
INTRAVENOUS | Status: DC | PRN
  Administered 2019-03-02: 50 mL

## 2019-03-02 MED ORDER — ONDANSETRON HCL 4 MG PO TABS: 4.0000 mg | ORAL_TABLET | Freq: Four times a day (QID) | ORAL | Status: DC | PRN

## 2019-03-02 MED ORDER — OXYCODONE HCL 10 MG PO TABS: 10.0000 mg | ORAL_TABLET | ORAL | Status: DC | PRN

## 2019-03-02 MED ORDER — WATER FOR IRRIGATION, STERILE IR SOLN
Status: DC | PRN
  Administered 2019-03-02: 2000 mL

## 2019-03-02 MED ORDER — PHENOL 1.4 % MT LIQD
1.0000 | OROMUCOSAL | Status: DC | PRN
  Filled 2019-03-02: qty 177

## 2019-03-02 MED ORDER — MIDAZOLAM HCL 5 MG/5ML IJ SOLN
INTRAMUSCULAR | Status: DC | PRN
  Administered 2019-03-02: 2 mg via INTRAVENOUS

## 2019-03-02 MED ORDER — SODIUM CHLORIDE 0.9 % IR SOLN
Status: DC | PRN
  Administered 2019-03-02: 1

## 2019-03-02 MED ORDER — TRANEXAMIC ACID-NACL 1000-0.7 MG/100ML-% IV SOLN
1000.0000 mg | INTRAVENOUS | Status: AC
  Administered 2019-03-02: 1000 mg via INTRAVENOUS
  Filled 2019-03-02: qty 100

## 2019-03-02 MED ORDER — LIDOCAINE 2% (20 MG/ML) 5 ML SYRINGE
INTRAMUSCULAR | Status: DC | PRN
  Administered 2019-03-02: 80 mg via INTRAVENOUS

## 2019-03-02 MED ORDER — SODIUM CHLORIDE 0.9 % IV SOLN
INTRAVENOUS | Status: DC
  Administered 2019-03-02 – 2019-03-03 (×2): via INTRAVENOUS

## 2019-03-02 MED ORDER — OXYCODONE HCL 5 MG PO TABS: 5.0000 mg | ORAL_TABLET | Freq: Once | ORAL | Status: DC | PRN

## 2019-03-02 MED ORDER — HYDROMORPHONE HCL 1 MG/ML IJ SOLN: 1.0000 mg | INTRAMUSCULAR | Status: DC | PRN

## 2019-03-02 MED ORDER — PHENYLEPHRINE 40 MCG/ML (10ML) SYRINGE FOR IV PUSH (FOR BLOOD PRESSURE SUPPORT)
PREFILLED_SYRINGE | INTRAVENOUS | Status: DC | PRN
  Administered 2019-03-02: 40 ug via INTRAVENOUS
  Administered 2019-03-02: 80 ug via INTRAVENOUS
  Administered 2019-03-02: 40 ug via INTRAVENOUS
  Administered 2019-03-02: 80 ug via INTRAVENOUS

## 2019-03-02 MED ORDER — ACETAMINOPHEN 325 MG PO TABS
325.0000 mg | ORAL_TABLET | Freq: Four times a day (QID) | ORAL | Status: DC | PRN
  Administered 2019-03-02: 16:00:00 650 mg via ORAL
  Filled 2019-03-02: qty 2

## 2019-03-02 MED ORDER — ATORVASTATIN CALCIUM 20 MG PO TABS
20.0000 mg | ORAL_TABLET | Freq: Every day | ORAL | Status: DC
  Administered 2019-03-02: 20 mg via ORAL
  Filled 2019-03-02: qty 1

## 2019-03-02 MED ORDER — SODIUM CHLORIDE 0.9 % IV SOLN: INTRAVENOUS | Status: DC

## 2019-03-02 MED ORDER — BISACODYL 10 MG RE SUPP: 10.0000 mg | Freq: Every day | RECTAL | Status: DC | PRN

## 2019-03-02 MED ORDER — METOCLOPRAMIDE HCL 5 MG/ML IJ SOLN: 5.0000 mg | Freq: Three times a day (TID) | INTRAMUSCULAR | Status: DC | PRN

## 2019-03-02 MED ORDER — TRANEXAMIC ACID 1000 MG/10ML IV SOLN
2000.0000 mg | INTRAVENOUS | Status: DC
  Filled 2019-03-02: qty 20

## 2019-03-02 MED ORDER — CHLORHEXIDINE GLUCONATE 4 % EX LIQD: 60.0000 mL | Freq: Once | CUTANEOUS | Status: DC

## 2019-03-02 MED ORDER — HYDROMORPHONE HCL 1 MG/ML IJ SOLN
0.2500 mg | INTRAMUSCULAR | Status: DC | PRN
  Administered 2019-03-02 (×2): 0.5 mg via INTRAVENOUS

## 2019-03-02 MED ORDER — TRANEXAMIC ACID-NACL 1000-0.7 MG/100ML-% IV SOLN
1000.0000 mg | Freq: Once | INTRAVENOUS | Status: AC
  Administered 2019-03-02: 15:00:00 1000 mg via INTRAVENOUS
  Filled 2019-03-02: qty 100

## 2019-03-02 MED ORDER — OXYCODONE HCL 5 MG PO TABS
10.0000 mg | ORAL_TABLET | ORAL | Status: DC | PRN
  Administered 2019-03-02 – 2019-03-03 (×4): 10 mg via ORAL
  Filled 2019-03-02 (×5): qty 2

## 2019-03-02 MED ORDER — POLYETHYLENE GLYCOL 3350 17 G PO PACK: 17.0000 g | PACK | Freq: Every day | ORAL | Status: DC | PRN

## 2019-03-02 MED ORDER — OXYCODONE HCL 10 MG PO TABS: 10.0000 mg | ORAL_TABLET | ORAL | 0 refills | Status: DC | PRN

## 2019-03-02 MED ORDER — APIXABAN 2.5 MG PO TABS
2.5000 mg | ORAL_TABLET | Freq: Two times a day (BID) | ORAL | Status: DC
  Administered 2019-03-03: 2.5 mg via ORAL
  Filled 2019-03-02: qty 1

## 2019-03-02 MED ORDER — INSULIN ASPART 100 UNIT/ML ~~LOC~~ SOLN
0.0000 [IU] | Freq: Three times a day (TID) | SUBCUTANEOUS | Status: DC
  Administered 2019-03-02: 18:00:00 1 [IU] via SUBCUTANEOUS

## 2019-03-02 MED ORDER — FENTANYL CITRATE (PF) 100 MCG/2ML IJ SOLN
INTRAMUSCULAR | Status: DC | PRN
  Administered 2019-03-02: 25 ug via INTRAVENOUS
  Administered 2019-03-02: 50 ug via INTRAVENOUS
  Administered 2019-03-02: 75 ug via INTRAVENOUS
  Administered 2019-03-02: 50 ug via INTRAVENOUS

## 2019-03-02 MED ORDER — ONDANSETRON HCL 4 MG/2ML IJ SOLN: 4.0000 mg | Freq: Four times a day (QID) | INTRAMUSCULAR | Status: DC | PRN

## 2019-03-02 MED ORDER — MENTHOL 3 MG MT LOZG: 1.0000 | LOZENGE | OROMUCOSAL | Status: DC | PRN

## 2019-03-02 MED ORDER — ONDANSETRON HCL 4 MG/2ML IJ SOLN
INTRAMUSCULAR | Status: DC | PRN
  Administered 2019-03-02: 4 mg via INTRAVENOUS

## 2019-03-02 MED ORDER — ROCURONIUM BROMIDE 10 MG/ML (PF) SYRINGE
PREFILLED_SYRINGE | INTRAVENOUS | Status: DC | PRN
  Administered 2019-03-02: 50 mg via INTRAVENOUS

## 2019-03-02 MED ORDER — FENTANYL CITRATE (PF) 100 MCG/2ML IJ SOLN
INTRAMUSCULAR | Status: AC
  Filled 2019-03-02: qty 4

## 2019-03-02 MED ORDER — FLEET ENEMA 7-19 GM/118ML RE ENEM: 1.0000 | ENEMA | Freq: Once | RECTAL | Status: DC | PRN

## 2019-03-02 MED ORDER — DOCUSATE SODIUM 100 MG PO CAPS: 100.0000 mg | ORAL_CAPSULE | Freq: Two times a day (BID) | ORAL | Status: DC

## 2019-03-02 MED ORDER — BUPIVACAINE LIPOSOME 1.3 % IJ SUSP
20.0000 mL | Freq: Once | INTRAMUSCULAR | Status: DC
  Filled 2019-03-02: qty 20

## 2019-03-02 MED ORDER — DEXAMETHASONE SODIUM PHOSPHATE 10 MG/ML IJ SOLN
INTRAMUSCULAR | Status: DC | PRN
  Administered 2019-03-02: 8 mg via INTRAVENOUS

## 2019-03-02 MED ORDER — BUPIVACAINE-EPINEPHRINE (PF) 0.25% -1:200000 IJ SOLN
INTRAMUSCULAR | Status: DC | PRN
  Administered 2019-03-02: 30 mL

## 2019-03-02 MED ORDER — CEFAZOLIN SODIUM-DEXTROSE 2-4 GM/100ML-% IV SOLN
2.0000 g | INTRAVENOUS | Status: AC
  Administered 2019-03-02: 08:00:00 2 g via INTRAVENOUS
  Filled 2019-03-02: qty 100

## 2019-03-02 MED ORDER — APIXABAN 2.5 MG PO TABS: 2.5000 mg | ORAL_TABLET | Freq: Two times a day (BID) | ORAL | 0 refills | Status: DC

## 2019-03-02 MED ORDER — ROPIVACAINE HCL 5 MG/ML IJ SOLN
INTRAMUSCULAR | Status: DC | PRN
  Administered 2019-03-02: 30 mL via PERINEURAL

## 2019-03-02 MED ORDER — INSULIN ASPART 100 UNIT/ML ~~LOC~~ SOLN
3.0000 [IU] | Freq: Three times a day (TID) | SUBCUTANEOUS | Status: DC
  Administered 2019-03-02: 3 [IU] via SUBCUTANEOUS

## 2019-03-02 MED ORDER — METOCLOPRAMIDE HCL 5 MG PO TABS: 5.0000 mg | ORAL_TABLET | Freq: Three times a day (TID) | ORAL | Status: DC | PRN

## 2019-03-02 MED ORDER — ONDANSETRON HCL 4 MG/2ML IJ SOLN: 4.0000 mg | Freq: Once | INTRAMUSCULAR | Status: DC | PRN

## 2019-03-02 MED ORDER — DOCUSATE SODIUM 100 MG PO CAPS
100.0000 mg | ORAL_CAPSULE | Freq: Two times a day (BID) | ORAL | Status: DC
  Administered 2019-03-02 – 2019-03-03 (×2): 100 mg via ORAL
  Filled 2019-03-02 (×2): qty 1

## 2019-03-02 MED ORDER — INSULIN ASPART 100 UNIT/ML ~~LOC~~ SOLN: 0.0000 [IU] | Freq: Every day | SUBCUTANEOUS | Status: DC

## 2019-03-02 MED ORDER — FENTANYL CITRATE (PF) 100 MCG/2ML IJ SOLN
25.0000 ug | INTRAMUSCULAR | Status: DC | PRN
  Administered 2019-03-02 (×3): 50 ug via INTRAVENOUS

## 2019-03-02 MED ORDER — HYDROMORPHONE HCL 1 MG/ML IJ SOLN
INTRAMUSCULAR | Status: AC
  Filled 2019-03-02: qty 1

## 2019-03-02 MED ORDER — PROPOFOL 10 MG/ML IV BOLUS
INTRAVENOUS | Status: DC | PRN
  Administered 2019-03-02: 160 mg via INTRAVENOUS

## 2019-03-02 MED ORDER — GABAPENTIN 300 MG PO CAPS
300.0000 mg | ORAL_CAPSULE | Freq: Three times a day (TID) | ORAL | Status: DC
  Administered 2019-03-02 – 2019-03-03 (×3): 300 mg via ORAL
  Filled 2019-03-02 (×3): qty 1

## 2019-03-02 MED ORDER — PANTOPRAZOLE SODIUM 40 MG PO TBEC
40.0000 mg | DELAYED_RELEASE_TABLET | Freq: Every day | ORAL | Status: DC
  Administered 2019-03-02 – 2019-03-03 (×2): 40 mg via ORAL
  Filled 2019-03-02 (×2): qty 1

## 2019-03-02 MED ORDER — POVIDONE-IODINE 10 % EX SWAB: 2.0000 "application " | Freq: Once | CUTANEOUS | Status: DC

## 2019-03-02 MED ORDER — SUCCINYLCHOLINE CHLORIDE 200 MG/10ML IV SOSY
PREFILLED_SYRINGE | INTRAVENOUS | Status: DC | PRN
  Administered 2019-03-02: 100 mg via INTRAVENOUS

## 2019-03-02 MED ORDER — BUPIVACAINE LIPOSOME 1.3 % IJ SUSP
INTRAMUSCULAR | Status: DC | PRN
  Administered 2019-03-02: 20 mL

## 2019-03-02 SURGICAL SUPPLY — 72 items
ANCHOR SUPER QUICK (Anchor) ×3 IMPLANT
ATTUNE MED DOME PAT 41 KNEE (Knees) ×2 IMPLANT
ATTUNE MED DOME PAT 41MM KNEE (Knees) ×1 IMPLANT
ATTUNE PS FEM RT SZ 6 CEM KNEE (Femur) ×3 IMPLANT
ATTUNE PSRP INSR SZ6 5 KNEE (Insert) ×2 IMPLANT
ATTUNE PSRP INSR SZ6 5MM KNEE (Insert) ×1 IMPLANT
BAG DECANTER FOR FLEXI CONT (MISCELLANEOUS) ×3 IMPLANT
BAG ZIPLOCK 12X15 (MISCELLANEOUS) ×3 IMPLANT
BASE TIBIAL ROT PLAT SZ 8 KNEE (Knees) ×1 IMPLANT
BENZOIN TINCTURE PRP APPL 2/3 (GAUZE/BANDAGES/DRESSINGS) ×3 IMPLANT
BLADE SAGITTAL 25.0X1.19X90 (BLADE) ×2 IMPLANT
BLADE SAGITTAL 25.0X1.19X90MM (BLADE) ×1
BLADE SAW SGTL 13X75X1.27 (BLADE) ×3 IMPLANT
BLADE SURG 15 STRL LF DISP TIS (BLADE) ×1 IMPLANT
BLADE SURG 15 STRL SS (BLADE) ×2
BLADE SURG SZ10 CARB STEEL (BLADE) ×9 IMPLANT
BNDG ELASTIC 6X15 VLCR STRL LF (GAUZE/BANDAGES/DRESSINGS) ×3 IMPLANT
BNDG ELASTIC 6X5.8 VLCR STR LF (GAUZE/BANDAGES/DRESSINGS) ×3 IMPLANT
BOWL SMART MIX CTS (DISPOSABLE) ×3 IMPLANT
BSPLAT TIB 8 CMNT ROT PLAT STR (Knees) ×1 IMPLANT
CEMENT HV SMART SET (Cement) ×6 IMPLANT
CLOSURE STERI-STRIP 1/2X4 (GAUZE/BANDAGES/DRESSINGS) ×2
CLOSURE WOUND 1/2 X4 (GAUZE/BANDAGES/DRESSINGS) ×1
CLSR STERI-STRIP ANTIMIC 1/2X4 (GAUZE/BANDAGES/DRESSINGS) ×4 IMPLANT
COVER SURGICAL LIGHT HANDLE (MISCELLANEOUS) ×3 IMPLANT
COVER WAND RF STERILE (DRAPES) ×3 IMPLANT
CUFF TOURN SGL QUICK 34 (TOURNIQUET CUFF) ×3
CUFF TRNQT CYL 34X4.125X (TOURNIQUET CUFF) ×1 IMPLANT
DECANTER SPIKE VIAL GLASS SM (MISCELLANEOUS) ×6 IMPLANT
DRAPE INCISE IOBAN 66X45 STRL (DRAPES) ×3 IMPLANT
DRAPE ORTHO SPLIT 77X108 STRL (DRAPES) ×4
DRAPE SURG ORHT 6 SPLT 77X108 (DRAPES) ×2 IMPLANT
DRAPE U-SHAPE 47X51 STRL (DRAPES) ×3 IMPLANT
DRESSING AQUACEL AG SP 3.5X10 (GAUZE/BANDAGES/DRESSINGS) ×1 IMPLANT
DRSG AQUACEL AG SP 3.5X10 (GAUZE/BANDAGES/DRESSINGS) ×3
DRSG PAD ABDOMINAL 8X10 ST (GAUZE/BANDAGES/DRESSINGS) ×3 IMPLANT
DURAPREP 26ML APPLICATOR (WOUND CARE) ×6 IMPLANT
ELECT REM PT RETURN 15FT ADLT (MISCELLANEOUS) ×3 IMPLANT
GAUZE SPONGE 4X4 12PLY STRL (GAUZE/BANDAGES/DRESSINGS) ×3 IMPLANT
GLOVE BIOGEL PI IND STRL 8 (GLOVE) ×2 IMPLANT
GLOVE BIOGEL PI INDICATOR 8 (GLOVE) ×4
GLOVE SURG ORTHO 8.0 STRL STRW (GLOVE) ×3 IMPLANT
GLOVE SURG SS PI 7.5 STRL IVOR (GLOVE) ×3 IMPLANT
GOWN STRL REUS W/TWL XL LVL3 (GOWN DISPOSABLE) ×6 IMPLANT
HANDPIECE INTERPULSE COAX TIP (DISPOSABLE) ×2
HOLDER FOLEY CATH W/STRAP (MISCELLANEOUS) IMPLANT
HOOD PEEL AWAY FLYTE STAYCOOL (MISCELLANEOUS) ×3 IMPLANT
IMMOBILIZER KNEE 20 (SOFTGOODS) ×3
IMMOBILIZER KNEE 20 THIGH 36 (SOFTGOODS) ×1 IMPLANT
IMMOBILIZER KNEE 22 UNIV (SOFTGOODS) ×3 IMPLANT
KIT TURNOVER KIT A (KITS) IMPLANT
MANIFOLD NEPTUNE II (INSTRUMENTS) ×3 IMPLANT
NEEDLE HYPO 22GX1.5 SAFETY (NEEDLE) ×6 IMPLANT
NS IRRIG 1000ML POUR BTL (IV SOLUTION) ×3 IMPLANT
PACK ICE MAXI GEL EZY WRAP (MISCELLANEOUS) ×3 IMPLANT
PACK TOTAL KNEE CUSTOM (KITS) ×3 IMPLANT
PADDING CAST COTTON 6X4 STRL (CAST SUPPLIES) ×6 IMPLANT
PIN STEINMAN FIXATION KNEE (PIN) ×3 IMPLANT
PROTECTOR NERVE ULNAR (MISCELLANEOUS) ×3 IMPLANT
SET HNDPC FAN SPRY TIP SCT (DISPOSABLE) ×1 IMPLANT
STRIP CLOSURE SKIN 1/2X4 (GAUZE/BANDAGES/DRESSINGS) ×2 IMPLANT
SUT ETHIBOND NAB CT1 #1 30IN (SUTURE) ×9 IMPLANT
SUT MNCRL AB 3-0 PS2 18 (SUTURE) ×3 IMPLANT
SUT VIC AB 0 CT1 36 (SUTURE) ×3 IMPLANT
SUT VIC AB 2-0 CT1 27 (SUTURE) ×4
SUT VIC AB 2-0 CT1 TAPERPNT 27 (SUTURE) ×2 IMPLANT
SYR CONTROL 10ML LL (SYRINGE) ×6 IMPLANT
TIBIAL BASE ROT PLAT SZ 8 KNEE (Knees) ×3 IMPLANT
TRAY FOLEY MTR SLVR 16FR STAT (SET/KITS/TRAYS/PACK) ×3 IMPLANT
WATER STERILE IRR 1000ML POUR (IV SOLUTION) ×6 IMPLANT
WRAP KNEE MAXI GEL POST OP (GAUZE/BANDAGES/DRESSINGS) ×3 IMPLANT
YANKAUER SUCT BULB TIP 10FT TU (MISCELLANEOUS) ×3 IMPLANT

## 2019-03-02 NOTE — Brief Op Note (Signed)
03/02/2019  10:00 AM  PATIENT:  [name]  68 y.o. male  PRE-OPERATIVE DIAGNOSIS:  OA RIGHT KNEE  POST-OPERATIVE DIAGNOSIS:  OA RIGHT KNEE  PROCEDURE:  Procedure(s): RIGHT TOTAL KNEE ARTHROPLASTY (Right)  SURGEON:  Surgeon(s) and Role:    Earlie Server, MD - Primary  PHYSICIAN ASSISTANT: Chriss Czar, PA-C  ASSISTANTS: OR staff x1   ANESTHESIA:   local, regional and general  EBL:  50 mL   BLOOD ADMINISTERED:none  DRAINS: none   LOCAL MEDICATIONS USED:  MARCAINE     SPECIMEN:  No Specimen  DISPOSITION OF SPECIMEN:  N/A  COUNTS:  YES  TOURNIQUET:   Total Tourniquet Time Documented: Thigh (Right) - 63 minutes Total: Thigh (Right) - 63 minutes   DICTATION: .Other Dictation: Dictation Number unknown  PLAN OF CARE: Admit for overnight observation  PATIENT DISPOSITION:  PACU - hemodynamically stable.   Delay start of Pharmacological VTE agent (>24hrs) due to surgical blood loss or risk of bleeding: yes

## 2019-03-02 NOTE — Plan of Care (Signed)

## 2019-03-02 NOTE — Anesthesia Procedure Notes (Signed)
Procedure Name: Intubation Date/Time: 03/02/2019 7:51 AM Performed by: Victoriano Lain, CRNA Pre-anesthesia Checklist: Patient identified, Emergency Drugs available, Suction available, Patient being monitored and Timeout performed Patient Re-evaluated:Patient Re-evaluated prior to induction Oxygen Delivery Method: Circle system utilized Preoxygenation: Pre-oxygenation with 100% oxygen Induction Type: IV induction Ventilation: Mask ventilation without difficulty Laryngoscope Size: Glidescope and 3 Grade View: Grade I Tube type: Oral Tube size: 7.5 mm Number of attempts: 2 Airway Equipment and Method: Video-laryngoscopy and Stylet Placement Confirmation: ETT inserted through vocal cords under direct vision,  positive ETCO2 and breath sounds checked- equal and bilateral Secured at: 21 cm Tube secured with: Tape Dental Injury: Teeth and Oropharynx as per pre-operative assessment  Difficulty Due To: Difficulty was anticipated Comments: DL X 1 with MAC 4. Grade 2-3 view. - ETCO2. ETT removed. Easy mask.VSS. DL X 1 with Glidescope 3. Grade 1 view. ATOI. +ETCO2. BBS=. ETT secured at 21 cm at the lip. Difficulty anticipated due to history of neck radiation and recent anterior cervical neck surgery.

## 2019-03-02 NOTE — Discharge Instructions (Signed)

## 2019-03-02 NOTE — Anesthesia Postprocedure Evaluation (Signed)
Anesthesia Post Note  Patient: [name]  Procedure(s) Performed: RIGHT TOTAL KNEE ARTHROPLASTY (Right Knee)     Patient location during evaluation: PACU Anesthesia Type: General Level of consciousness: awake and alert Pain management: pain level controlled Vital Signs Assessment: post-procedure vital signs reviewed and stable Respiratory status: spontaneous breathing, nonlabored ventilation and respiratory function stable Cardiovascular status: blood pressure returned to baseline and stable Postop Assessment: no apparent nausea or vomiting Anesthetic complications: no    Last Vitals:  Vitals:   03/02/19 0534 03/02/19 1008  BP: 132/84   Pulse: 84   Resp: 18   Temp: 37 C 36.4 C  SpO2: 100% 100%    Last Pain:  Vitals:   03/02/19 1008  TempSrc:   PainSc: 6                  Lidia Collum

## 2019-03-02 NOTE — Op Note (Signed)
NAME: [name], AMPARO MEDICAL RECORD [id number] ACCOUNT [id number] DATE OF BIRTH:July 27, 1951 [facility] LOCATION: [facility] PHYSICIAN:W. Canton Yearby JR., MD  OPERATIVE REPORT  DATE OF PROCEDURE:  03/02/2019  PREOPERATIVE DIAGNOSIS:  Severe osteoarthritis with varus deformity and flexion contracture, right knee.  OPERATION:  Right total knee replacement (Attune size 6 femur, size 8 tibia with size 6, 5 mm thick tibial bearing, 41 mm size dome Attune patella).  SURGEON:  Vangie Bicker, MD  ASSISTANTMarjo Bicker.  ANESTHESIA:  General.  TOURNIQUET TIME:  63 minutes.  DESCRIPTION OF PROCEDURE:  Sterile prep and drape, exsanguination of the leg, inflation of thigh tourniquet at 350.  Straight skin incision was made with a medial parapatellar approach to the knee made.  We did an entry drill hole on the femur followed  by placement of the distal femoral cutting block with a 5 degree valgus, 9 mm resection.  We resected the distal femur.  We then resected about 9 mm below the least diseased lateral compartment.  There was a significant varus deformity requiring  stripping of the medial side of the knee.  The extension gap after the tibial cut was measured at 5 mm.  Femur was sized to be a size 6.  The anterior posterior dimensions of the femur were ____.  There was some mismatch from the femoral to the tibial  side.  We placed all-in-1 cutting block and did the anterior, posterior chamfer cuts on the femur followed by sizing the tibia.  Tibia was sized to be a size 8.  We released the PCL.  Excess menisci were removed and posterior osteophytes were removed  from the medial side of the knee.  Exparel and Marcaine was infiltrated in the capsular and subcutaneous tissues.  Box cut was cut for the femur, followed by placement of the femoral trial drilling of the lugholes for the femur and then we placed the  tibial trial plate with the 5 mm bearing.  The knee was definitely to zero  compared to having a mild flexion contracture preoperatively.  Good resolution of the varus deformity and the implant tracked well.  The patella had previously been cut resecting  9.5 mm for placement of the all poly trial.  Due to the thickness of the patella and the size as well as the varus deformity, we did not detach the patellar tendon, but I thought it was mildly slightly more than average for the exposure.  We did place a  Mitek QuickAnchor for additional fixation of the patellar tendon insertion.  Prior to this, we placed the final components, tibia followed by femur and patella.  Cement was allowed to harden with the trial bearing.  We then placed the final bearing after  checking the posterior aspect of the knee and removing some cement.  The tourniquet was released under direct vision, no excessive bleeding was noted.  Closure was affected after placement of the final bearing and copious irrigation with interrupted  Ethibond, 2-0 Vicryl and Monocryl.  A lightly compressive sterile dressing applied and taken to recovery room in stable condition.  TN/NUANCE  D:03/02/2019 T:03/02/2019 JOB:[id number]

## 2019-03-02 NOTE — Interval H&P Note (Signed)
History and Physical Interval Note:  03/02/2019 7:34 AM  [name]  has presented today for surgery, with the diagnosis of OA RIGHT KNEE.  The various methods of treatment have been discussed with the patient and family. After consideration of risks, benefits and other options for treatment, the patient has consented to  Procedure(s): RIGHT TOTAL KNEE ARTHROPLASTY (Right) as a surgical intervention.  The patient's history has been reviewed, patient examined, no change in status, stable for surgery.  I have reviewed the patient's chart and labs.  Questions were answered to the patient's satisfaction.     Yvette Rack

## 2019-03-02 NOTE — Anesthesia Procedure Notes (Signed)
Anesthesia Regional Block: Adductor canal block   Pre-Anesthetic Checklist: ,, timeout performed, Correct Patient, Correct Site, Correct Laterality, Correct Procedure, Correct Position, site marked, Risks and benefits discussed,  Surgical consent,  Pre-op evaluation,  At surgeon's request and post-op pain management  Laterality: Right  Prep: chloraprep       Needles:  Injection technique: Single-shot  Needle Type: Echogenic Stimulator Needle     Needle Length: 10cm  Needle Gauge: 21     Additional Needles:   Procedures:,,,, ultrasound used (permanent image in chart),,,,  Narrative:  Start time: 03/02/2019 7:15 AM End time: 03/02/2019 7:21 AM Injection made incrementally with aspirations every 5 mL.  Performed by: Personally  Anesthesiologist: Lidia Collum, MD  Additional Notes: Monitors applied. Injection made in 5cc increments. No resistance to injection. Good needle visualization. Patient tolerated procedure well.

## 2019-03-02 NOTE — Transfer of Care (Signed)
Immediate Anesthesia Transfer of Care Note  Patient: [name]  Procedure(s) Performed: RIGHT TOTAL KNEE ARTHROPLASTY (Right Knee)  Patient Location: PACU  Anesthesia Type:General  Level of Consciousness: awake, alert , oriented and patient cooperative  Airway & Oxygen Therapy: Patient Spontanous Breathing and Patient connected to face mask oxygen  Post-op Assessment: Report given to RN, Post -op Vital signs reviewed and stable and Patient moving all extremities  Post vital signs: Reviewed and stable  Last Vitals:  Vitals Value Taken Time  BP 135/85 03/02/19 1008  Temp    Pulse 81 03/02/19 1011  Resp 17 03/02/19 1011  SpO2 100 % 03/02/19 1011  Vitals shown include unvalidated device data.  Last Pain:  Vitals:   03/02/19 0534  TempSrc: Oral      Patients Stated Pain Goal: 4 (26/41/58 3094)  Complications: No apparent anesthesia complications

## 2019-03-02 NOTE — Evaluation (Signed)
Physical Therapy Evaluation Patient Details Name: [name] MRN: [id number] DOB: July 31, 1951 Today's Date: 03/02/2019   History of Present Illness  68 yo male s/p R TKR on 03/02/19. PMH: C3-C4 ACDF 12/2018, throat cancer s/p radiation, multiple spine surgeries, L TKA, Hep C, DDD, DM, remote polysubstance abuse.  Clinical Impression   Pt presents with R knee pain, R knee weakness post-surgically, decreased R knee ROM, difficulty performing bed mobility, increased time and effort to perform mobility tasks, and decreased activity tolerance due to R knee pain post-operatively. Pt to benefit from acute PT to address deficits. Pt ambulated hallway distance with min guard assist, verbal cuing for form and safety provided throughout. Pt educated on ankle pumps (20/hour) to perform this afternoon/evening to increase circulation, to pt's tolerance and limited by pain. PT to progress mobility as tolerated, and will continue to follow acutely.        Follow Up Recommendations Follow surgeon's recommendation for DC plan and follow-up therapies;Supervision for mobility/OOB(HHPT)    Equipment Recommendations  None recommended by PT    Recommendations for Other Services       Precautions / Restrictions Precautions Precautions: Fall Required Braces or Orthoses: Knee Immobilizer - Right Knee Immobilizer - Right: On when out of bed or walking;Discontinue once straight leg raise with < 10 degree lag Restrictions Weight Bearing Restrictions: No Other Position/Activity Restrictions: WBAT      Mobility  Bed Mobility Overal bed mobility: Needs Assistance Bed Mobility: Supine to Sit     Supine to sit: Min assist;HOB elevated     General bed mobility comments: min assist for RLE lifting and translation to EOB, verbal cuing for sequencing, increased time and effort to perform.  Transfers Overall transfer level: Needs assistance Equipment used: Rolling walker (2 wheeled) Transfers: Sit to/from  Stand Sit to Stand: Min guard;From elevated surface         General transfer comment: Min guard for safety, verbal cuing for hand placement when rising.  Ambulation/Gait Ambulation/Gait assistance: Min guard Gait Distance (Feet): 125 Feet Assistive device: Rolling walker (2 wheeled) Gait Pattern/deviations: Step-to pattern;Step-through pattern;Decreased stride length;Trunk flexed Gait velocity: decr   General Gait Details: Min guard for safety, verbal cuing for sequencing, placement in RW, upright posture, and turning with RW.  Stairs            Wheelchair Mobility    Modified Rankin (Stroke Patients Only)       Balance Overall balance assessment: Mild deficits observed, not formally tested                                           Pertinent Vitals/Pain Pain Assessment: 0-10 Pain Score: 8  Pain Location: R knee, especially laterally Pain Descriptors / Indicators: Sore;Discomfort Pain Intervention(s): Limited activity within patient's tolerance;Monitored during session;Premedicated before session;Repositioned;Ice applied    Home Living Family/patient expects to be discharged to:: Private residence Living Arrangements: Alone Available Help at Discharge: Friend(s);Family;Available PRN/intermittently(pt states a friend will stay with him for 2 weeks if his landlord allows it) Type of Home: Apartment Home Access: [place]: One level Home Equipment: Walker - 2 wheels;Other (comment)(CPM)      Prior Function Level of Independence: Independent         Comments: pt reports doing everything for self, does not drive so daughter assists with groceries per previous OT note from  [facility] in 12/2018     Hand Dominance   Dominant Hand: Right    Extremity/Trunk Assessment   Upper Extremity Assessment Upper Extremity Assessment: Overall WFL for tasks assessed    Lower Extremity Assessment Lower Extremity Assessment: Overall WFL for  tasks assessed;RLE deficits/detail RLE Deficits / Details: suspected post-surgical weakness; able to perform ankle pumps, quad set, heel slide to 90*, SLR with 10* quad lag RLE Sensation: WNL    Cervical / Trunk Assessment Cervical / Trunk Assessment: Normal  Communication   Communication: No difficulties  Cognition Arousal/Alertness: Awake/alert Behavior During Therapy: WFL for tasks assessed/performed Overall Cognitive Status: Within Functional Limits for tasks assessed                                 General Comments: Pt very pleasant and motivated to progress mobility      General Comments      Exercises     Assessment/Plan    PT Assessment Patient needs continued PT services  PT Problem List Decreased strength;Decreased mobility;Decreased range of motion;Decreased activity tolerance;Decreased balance;Decreased knowledge of use of DME;Pain       PT Treatment Interventions DME instruction;Therapeutic activities;Gait training;Therapeutic exercise;Patient/family education;Balance training;Functional mobility training    PT Goals (Current goals can be found in the Care Plan section)  Acute Rehab PT Goals Patient Stated Goal: go home PT Goal Formulation: With patient Time For Goal Achievement: 03/09/19 Potential to Achieve Goals: Good    Frequency 7X/week   Barriers to discharge        Co-evaluation               AM-PAC PT "6 Clicks" Mobility  Outcome Measure Help needed turning from your back to your side while in a flat bed without using bedrails?: A Little Help needed moving from lying on your back to sitting on the side of a flat bed without using bedrails?: A Little Help needed moving to and from a bed to a chair (including a wheelchair)?: A Little Help needed standing up from a chair using your arms (e.g., wheelchair or bedside chair)?: A Little Help needed to walk in hospital room?: A Little Help needed climbing 3-5 steps with a railing?  : A Little 6 Click Score: 18    End of Session Equipment Utilized During Treatment: Gait belt;Right knee immobilizer Activity Tolerance: Patient tolerated treatment well Patient left: in chair;with call bell/phone within reach;with chair alarm set;with SCD's reapplied Nurse Communication: Mobility status PT Visit Diagnosis: Difficulty in walking, not elsewhere classified (R26.2);Other abnormalities of gait and mobility (R26.89)    Time: 8469-6295 PT Time Calculation (min) (ACUTE ONLY): 28 min   Charges:   PT Evaluation $PT Eval Low Complexity: 1 Low PT Treatments $Gait Training: 8-22 mins      [name], PT Acute Rehabilitation Services Pager [phone]  Office [phone]   [name] 03/02/2019, 5:12 PM

## 2019-03-03 DIAGNOSIS — M1711 Unilateral primary osteoarthritis, right knee: Secondary | ICD-10-CM | POA: Diagnosis not present

## 2019-03-03 LAB — BASIC METABOLIC PANEL
Anion gap: 10 (ref 5–15)
BUN: 10 mg/dL (ref 8–23)
CO2: 27 mmol/L (ref 22–32)
Calcium: 9.8 mg/dL (ref 8.9–10.3)
Chloride: 103 mmol/L (ref 98–111)
Creatinine, Ser: 0.82 mg/dL (ref 0.61–1.24)
GFR calc Af Amer: >60 mL/min (ref >60)
GFR calc non Af Amer: >60 mL/min (ref >60)
Glucose, Bld: 97 mg/dL (ref 70–99)
Potassium: 4.1 mmol/L (ref 3.5–5.1)
Sodium: 140 mmol/L (ref 135–145)

## 2019-03-03 LAB — CBC
HCT: 46.1 % (ref 39.0–52.0)
Hemoglobin: 15.1 g/dL (ref 13.0–17.0)
MCH: 31.2 pg (ref 26.0–34.0)
MCHC: 32.8 g/dL (ref 30.0–36.0)
MCV: 95.2 fL (ref 80.0–100.0)
Platelets: 252 10*3/uL (ref 150–400)
RBC: 4.84 MIL/uL (ref 4.22–5.81)
RDW: 12.5 % (ref 11.5–15.5)
WBC: 13.5 10*3/uL — ABNORMAL HIGH (ref 4.0–10.5)
nRBC: 0 % (ref 0.0–0.2)

## 2019-03-03 LAB — GLUCOSE, CAPILLARY: Glucose-Capillary: 65 mg/dL — ABNORMAL LOW (ref 70–99)

## 2019-03-03 MED ORDER — SODIUM CHLORIDE 0.9 % IV BOLUS
500.0000 mL | Freq: Once | INTRAVENOUS | Status: AC
  Administered 2019-03-03: 500 mL via INTRAVENOUS

## 2019-03-03 NOTE — Progress Notes (Signed)
    Home health agencies that serve [phone].        [place] Quality of Patient Care Rating Patient Survey Summary Rating  [facility] [phone] 3 out of 5 stars 5 out of [place] [phone] 3 out of 5 stars 4 out of [place] [phone]) [phone] 4  out of 5 stars 3 out of [place] [phone]  out of 5 stars 4 out of [place] [phone] 4 out of 5 stars 4 out of [place] [phone] 4 out of 5 stars 4 out of [place] [phone] 3 out of 5 stars 3 out of 5 stars  [facility] [place] [phone] 3  out of 5 stars 4 out of [place] [phone] 3 out of 5 stars 4 out of 5 stars  [facility] [phone] 3  out of 5 stars 3 out of [place] [phone] 4 out of 5 stars 5 out of [place] [phone] 3  out of 5 stars 4 out of [place] [phone]  out of 5 stars 4 out of [place] [phone] 3 out of 5 stars 4 out of [place] [phone] Not Available4 Not [facility] [phone] 2  out of 5 stars 4 out of [place] [phone] 4  out of 5 stars 3 out of [place] [phone]  out of 5 stars 2 out of [place] number Footnote as displayed on [place]  1 This agency provides services under a federal waiver program to non-traditional, chronic long term population.  2 This agency provides services to a special needs population.  3 Not Available.  4 The number of patient episodes for this measure is too small to  report.  5 This measure currently does not have data or provider has been certified/recertified for less than 6 months.  6 The national average for this measure is not provided because of state-to-state differences in data collection.  7 Medicare is not displaying rates for this measure for any home health agency, because of an issue with the data.  8 There were problems with the data and they are being corrected.  9 Zero, or very few, patients met the survey's rules for inclusion. The scores shown, if any, reflect a very small number of surveys and may not accurately tell how an agency is doing.  10 Survey results are based on less than 12 months of data.  11 Fewer than 70 patients completed the survey. Use the scores shown, if any, with caution as the number of surveys may be too low to accurately tell how an agency is doing.  12 No survey results are available for this period.  13 Data suppressed by CMS for one or more quarters.

## 2019-03-03 NOTE — TOC Initial Note (Signed)
Transition of Care [facility]) - Initial/Assessment Note    Patient Details  Name: [name] MRN: [id number] Date of Birth: 1950/12/10  [facility]) CM/SW Contact:    Joaquin Courts, RN Phone Number: 03/03/2019, 11:22 AM  Clinical Narrative:   CM spoke with patient at bedside. Patient [facility] with [facility] [place]. [employer] to deliver 3-in-1 to bedside for home use. Patient reports he has rolling walker at home.                 Expected Discharge Plan: [place] Barriers to Discharge: No Barriers Identified   Patient Goals and CMS Choice Patient states their goals for this hospitalization and ongoing recovery are:: to go home CMS Medicare.gov Compare Post Acute Care list provided to:: Patient Choice offered to / list presented to : Patient  Expected Discharge Plan and Services Expected Discharge Plan: [place]   Discharge Planning Services: CM Consult Post Acute Care Choice: [place] arrangements for the past 2 months: Apartment Expected Discharge Date: 03/03/19               DME Arranged: 3-N-1 DME Agency: [facility] Date DME Agency Contacted: 03/03/19 Time DME Agency Contacted: 1121 Representative spoke with at DME Agency: [place]: PT [place]: [facility] [employer]) Date [place]: 03/03/19 Time Ages: 60 Representative spoke with at [place]: pre arranged  Prior Living Arrangements/Services Living arrangements for the past 2 months: [place] with:: Self Patient language and need for interpreter reviewed:: Yes Do you feel safe going back to the place where you live?: Yes      Need for Family Participation in Patient Care: Yes (Comment) Care giver support system in place?: Yes (comment)   Criminal Activity/Legal Involvement Pertinent to Current Situation/Hospitalization: No - Comment as needed  Activities of Daily Living Home Assistive  Devices/Equipment: Eyeglasses, Dentures (specify type) ADL Screening (condition at time of admission) Patient's cognitive ability adequate to safely complete daily activities?: Yes Is the patient deaf or have difficulty hearing?: Yes Does the patient have difficulty seeing, even when wearing glasses/contacts?: No Does the patient have difficulty concentrating, remembering, or making decisions?: No Patient able to express need for assistance with ADLs?: Yes Does the patient have difficulty dressing or bathing?: No Independently performs ADLs?: Yes (appropriate for developmental age) Does the patient have difficulty walking or climbing stairs?: No Weakness of Legs: None Weakness of Arms/Hands: None  Permission Sought/Granted                  Emotional Assessment Appearance:: Appears stated age Attitude/Demeanor/Rapport: Engaged Affect (typically observed): Accepting Orientation: : Oriented to Place, Oriented to  Time, Oriented to Situation, Oriented to Self   Psych Involvement: No (comment)  Admission diagnosis:  OA RIGHT KNEE Patient Active Problem List   Diagnosis Date Noted  . Primary localized osteoarthritis of right knee 03/02/2019  . Osteoarthritis of right knee 01/26/2019  . Cervical spondylosis with myelopathy and radiculopathy 12/28/2018  . 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 Rectal bleeding 05/30/2018  . Dark stools 05/30/2018  . NSAID long-term use 05/30/2018  . Secondary malignancy of lymph nodes of head, face and neck ([place]) 05/12/2018  . 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 Cervical stenosis of spinal canal 05/10/2018  . Neck mass 04/21/2018  . Liver fibrosis 04/05/2018  .  Fusion of lumbar spine 02/07/2017  . Constipation due to opioid therapy 01/27/2017  . Lumbar stenosis with neurogenic claudication 01/24/2017  . Enlarged prostate with urinary obstruction 06/16/2015  .  Lumbar degenerative disc disease 03/05/2015  . Chronic hepatitis C without hepatic coma ([place]) 12/09/2014  . Chronic low back pain 01/17/2014  . Osteoarthritis of left knee 09/21/2013  . Alcohol dependence ([place]) 06/12/2013  . Cocaine dependence ([place]) 06/12/2013  . Substance induced mood disorder ([place]) 06/12/2013   PCP:  Eston Esters, NP Pharmacy:   [facility] [place], [place] [address] Phone: [phone] Fax: [place], [place] - [address] [place] [address] Phone: [phone] Fax: [phone]  [place], [place] [address] Phone: [phone] Fax: [phone]     Social Determinants of Health (SDOH) Interventions    Readmission Risk Interventions No flowsheet data found.

## 2019-03-03 NOTE — Plan of Care (Signed)
Pt preparing for DC home today

## 2019-03-03 NOTE — Discharge Summary (Signed)
Patient ID: [name] MRN: [id number] DOB/AGE: Sep 10, 1950 68 y.o.  Admit date: 03/02/2019 Discharge date: 03/03/2019  Admission Diagnoses:  Active Problems:   Osteoarthritis of right knee   Primary localized osteoarthritis of right knee   Discharge Diagnoses:  Same  Past Medical History:  Diagnosis Date  . Alcohol abuse    until age 47  . Arthritis   . Bell's palsy 05/2017  . BPH (benign prostatic hyperplasia)   . Cervical spondylosis with myelopathy   . 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 Chronic hepatitis C without hepatic coma ([place]) FOLLOWED BY INFECTOUS DISEASE DR COMER   POSITIVE ANTIBODY TEST THIS YEAR 2016--  CURRENT TX ON HARVONI PO- completeed 2016  . Chronic low back pain   . Collapsed lung    right .[place]..15 yrs ago, fell and hit some bricks  . DDD (degenerative disc disease), lumbosacral   . Depression    no meds now- med used for bladder  . Diabetes mellitus without complication (HCC)    Type II, does not check cbg at home   . ED (erectile dysfunction)   . Foley catheter in place    removed, post lumbar surgery- cath left in throughh rehab- had 2 UTI's  . Full dentures   . GERD (gastroesophageal reflux disease)    hx none in long time  . H/O neck surgery    back of neck spine anterior entry 12-26-18 , 03-01-2019 reports s/p neck swelling of surgical site  that impair swallowing , patiwnr states "i will swallow and food with come beack up and come up into my nose "   . History of cocaine abuse ([place])    per pt quit and last used 06-03-2013  . History of radiation therapy 07/11/18- 09/01/18   Oropharynx/ treated to 60 Gy in 30 fractions of 2 Gy  . Lymphoma ([place])   . Urinary retention   . Wears glasses     Surgeries: Procedure(s): RIGHT TOTAL KNEE ARTHROPLASTY on 03/02/2019   Consultants:   Discharged Condition: Improved  Hospital Course: ASEEM SESSUMS is an 68 y.o. male who was admitted 03/02/2019 for  operative treatment of<principal problem not specified>. Patient has severe unremitting pain that affects sleep, daily activities, and work/hobbies. After pre-op clearance the patient was taken to the operating room on 03/02/2019 and underwent  Procedure(s): RIGHT TOTAL KNEE ARTHROPLASTY.    Patient was given perioperative antibiotics:  Anti-infectives (From admission, onward)   Start     Dose/Rate Route Frequency Ordered Stop   03/02/19 1400  ceFAZolin (ANCEF) IVPB 1 g/50 mL premix     1 g 100 mL/hr over 30 Minutes Intravenous Every 6 hours 03/02/19 1359 03/02/19 2124   03/02/19 0600  ceFAZolin (ANCEF) IVPB 2g/100 mL premix     2 g 200 mL/hr over 30 Minutes Intravenous On call to O.R. 03/02/19 0932 03/02/19 0812   02/16/19 0600  ceFAZolin (ANCEF) IVPB 2g/100 mL premix     2 g 200 mL/hr over 30 Minutes Intravenous On call to O.R. 02/16/19 6712 02/17/19 0559       Patient was given sequential compression devices, early ambulation, and chemoprophylaxis to prevent DVT.  Patient benefited maximally from hospital stay and there were no complications.    Recent vital signs:  Patient Vitals for the past 24 hrs:  BP Temp Temp src Pulse Resp SpO2  03/03/19 0439 130/76 97.9 F (36.6 C) Oral 77  18 100 %  03/03/19 0117 127/80 97.9 F (36.6 C) Oral 66 16 100 %  03/02/19 2057 137/80 97.7 F (36.5 C) - 73 20 100 %  03/02/19 1945 (!) 146/93 97.7 F (36.5 C) Oral 71 18 100 %  03/02/19 1746 (!) 139/114 97.6 F (36.4 C) Oral 79 16 (!) 84 %  03/02/19 1713 129/80 97.6 F (36.4 C) Oral 84 18 100 %  03/02/19 1553 (!) 146/88 (!) 97.4 F (36.3 C) Oral 88 16 100 %  03/02/19 1357 - 97.9 F (36.6 C) - 75 16 100 %  03/02/19 1356 (!) 154/91 - - - 16 -  03/02/19 1315 (!) 123/99 97.8 F (36.6 C) - 85 15 100 %  03/02/19 1300 (!) 173/96 - - 89 14 100 %  03/02/19 1245 (!) 153/100 - - 73 12 100 %  03/02/19 1230 (!) 155/98 - - 76 15 100 %  03/02/19 1145 139/89 - - 86 14 100 %  03/02/19 1130 (!) 143/91 - -  75 12 100 %  03/02/19 1115 (!) 156/98 - - 74 13 100 %  03/02/19 1100 (!) 142/91 97.9 F (36.6 C) - 78 15 100 %  03/02/19 1045 135/82 - - 84 15 100 %  03/02/19 1030 135/84 - - 84 (!) 21 100 %  03/02/19 1008 (!) 142/81 97.6 F (36.4 C) - - 15 100 %     Recent laboratory studies:  Recent Labs    03/01/19 1400 03/03/19 0325  WBC 6.0 13.5*  HGB 15.8 15.1  HCT 48.3 46.1  PLT 245 252  NA 138 140  K 5.2* 4.1  CL 101 103  CO2 28 27  BUN 11 10  CREATININE 0.88 0.82  GLUCOSE 107* 97  CALCIUM 10.0 9.8     Discharge Medications:   Allergies as of 03/03/2019   No Known Allergies     Medication List    STOP taking these medications   lidocaine 2 % solution Commonly known as: XYLOCAINE   multivitamin with iron-minerals liquid   oxyCODONE-acetaminophen 10-325 MG tablet Commonly known as: PERCOCET   sodium fluoride 1.1 % Crea dental cream Commonly known as: PreviDent 5000 Plus   sucralfate 1 g tablet Commonly known as: Carafate     TAKE these medications   apixaban 2.5 MG Tabs tablet Commonly known as: Eliquis Take 1 tablet (2.5 mg total) by mouth 2 (two) times daily for 14 days.   atorvastatin 20 MG tablet Commonly known as: LIPITOR Take 20 mg by mouth at bedtime.   cyclobenzaprine 10 MG tablet Commonly known as: FLEXERIL Take 1 tablet (10 mg total) by mouth 3 (three) times daily as needed for muscle spasms.   docusate sodium 100 MG capsule Commonly known as: COLACE Take 1 capsule (100 mg total) by mouth 2 (two) times daily.   gabapentin 300 MG capsule Commonly known as: NEURONTIN Take 300 mg by mouth 3 (three) times daily.   metFORMIN 1000 MG (MOD) 24 hr tablet Commonly known as: GLUMETZA Take 1,000 mg by mouth 2 (two) times a day.   Oxycodone HCl 10 MG Tabs Take 1 tablet (10 mg total) by mouth every 4 (four) hours as needed ((score 7 to 10)). What changed: reasons to take this   pantoprazole 40 MG tablet Commonly known as: PROTONIX Take 40 mg by  mouth daily.            Durable Medical Equipment  (From admission, onward)  Start     Ordered   03/02/19 1400  DME Walker rolling  Once    Question:  Patient needs a walker to treat with the following condition  Answer:  Primary localized osteoarthritis of right knee   03/02/19 1359   03/02/19 1400  DME 3 n 1  Once     03/02/19 1359          Diagnostic Studies: No results found.  Disposition: Discharge disposition: 01-Home or Self Care         Follow-up Information    Earlie Server, MD. Schedule an appointment as soon as possible for a visit in 2 weeks.   Specialty: Orthopedic Surgery Contact information: [place] [address] [phone]            Signed: Linda Hedges 03/03/2019, 7:29 AM

## 2019-03-03 NOTE — Care Management Obs Status (Signed)
[place] NOTIFICATION   Patient Details  Name: [name] MRN: [id number] Date of Birth: 1950/12/25   Medicare Observation Status Notification Given:  Yes    Joaquin Courts, RN 03/03/2019, 9:57 AM

## 2019-03-03 NOTE — Progress Notes (Signed)
Physical Therapy Treatment Patient Details Name: [name] MRN: [id number] DOB: 12-21-1950 Today's Date: 03/03/2019    History of Present Illness 68 yo male s/p R TKR on 03/02/19. PMH: C3-C4 ACDF 12/2018, throat cancer s/p radiation, multiple spine surgeries, L TKA, Hep C, DDD, DM, remote polysubstance abuse.    PT Comments    Reviewed/practiced exercises and gait training. Pt does not have any steps to get into apt. All educaton completed. Okay to d/c from PT standpoint.   Follow Up Recommendations  Follow surgeon's recommendation for DC plan and follow-up therapies;Supervision for mobility/OOB     Equipment Recommendations  None recommended by PT    Recommendations for Other Services       Precautions / Restrictions Precautions Precautions: Fall Required Braces or Orthoses: Knee Immobilizer - Right Knee Immobilizer - Right: Discontinue once straight leg raise with < 10 degree lag Restrictions Weight Bearing Restrictions: No Other Position/Activity Restrictions: WBAT    Mobility  Bed Mobility Overal bed mobility: Needs Assistance Bed Mobility: Supine to Sit     Supine to sit: Min guard;HOB elevated     General bed mobility comments: close guard for safety.  Transfers Overall transfer level: Needs assistance Equipment used: Rolling walker (2 wheeled) Transfers: Sit to/from Stand Sit to Stand: Min guard;From elevated surface         General transfer comment: Min guard for safety, verbal cuing for hand placement when rising.  Ambulation/Gait Ambulation/Gait assistance: Min guard Gait Distance (Feet): 150 Feet Assistive device: Rolling walker (2 wheeled) Gait Pattern/deviations: Step-to pattern;Step-through pattern;Decreased stride length     General Gait Details: Min guard for safety, verbal cuing for sequencing, placement in RW, upright posture, and turning with RW.   Stairs             Wheelchair Mobility    Modified Rankin (Stroke Patients  Only)       Balance Overall balance assessment: Mild deficits observed, not formally tested                                          Cognition Arousal/Alertness: Awake/alert Behavior During Therapy: WFL for tasks assessed/performed Overall Cognitive Status: Within Functional Limits for tasks assessed                                        Exercises Total Joint Exercises Ankle Circles/Pumps: AROM;Both;10 reps;Supine Quad Sets: AROM;Both;10 reps;Supine Heel Slides: AAROM;Right;10 reps;Supine Hip ABduction/ADduction: AAROM;Right;10 reps;Supine Straight Leg Raises: AAROM;Right;10 reps;Supine Goniometric ROM: ~10-65 degrees    General Comments        Pertinent Vitals/Pain Pain Assessment: 0-10 Pain Score: 9  Pain Location: R knee Pain Descriptors / Indicators: Discomfort;Sore Pain Intervention(s): Monitored during session;Ice applied    Home Living                      Prior Function            PT Goals (current goals can now be found in the care plan section) Progress towards PT goals: Progressing toward goals    Frequency    7X/week      PT Plan Current plan remains appropriate    Co-evaluation              AM-PAC PT "6 Clicks" Mobility  Outcome Measure  Help needed turning from your back to your side while in a flat bed without using bedrails?: A Little Help needed moving from lying on your back to sitting on the side of a flat bed without using bedrails?: A Little Help needed moving to and from a bed to a chair (including a wheelchair)?: A Little Help needed standing up from a chair using your arms (e.g., wheelchair or bedside chair)?: A Little Help needed to walk in hospital room?: A Little Help needed climbing 3-5 steps with a railing? : A Little 6 Click Score: 18    End of Session Equipment Utilized During Treatment: Gait belt;Right knee immobilizer Activity Tolerance: Patient tolerated treatment  well Patient left: in chair;with call bell/phone within reach   PT Visit Diagnosis: Difficulty in walking, not elsewhere classified (R26.2);Other abnormalities of gait and mobility (R26.89)     Time: 4709-6283 PT Time Calculation (min) (ACUTE ONLY): 27 min  Charges:  $Gait Training: 8-22 mins $Therapeutic Exercise: 8-22 mins                        Weston Anna, PT Acute Rehabilitation Services Pager: [phone] Office: [phone]

## 2019-03-04 ENCOUNTER — Other Ambulatory Visit: Payer: Self-pay

## 2019-03-04 ENCOUNTER — Encounter (HOSPITAL_COMMUNITY): Payer: Self-pay

## 2019-03-04 ENCOUNTER — Emergency Department (HOSPITAL_COMMUNITY)
Admission: EM | Admit: 2019-03-04 | Discharge: 2019-03-04 | Disposition: A | Discharge status: Home / Self Care | Payer: Medicare HMO | Attending: Emergency Medicine | Admitting: Emergency Medicine

## 2019-03-04 DIAGNOSIS — Z79899 Other long term (current) drug therapy: Secondary | ICD-10-CM | POA: Diagnosis not present

## 2019-03-04 DIAGNOSIS — Z923 Personal history of irradiation: Secondary | ICD-10-CM | POA: Insufficient documentation

## 2019-03-04 DIAGNOSIS — Z7901 Long term (current) use of anticoagulants: Secondary | ICD-10-CM | POA: Insufficient documentation

## 2019-03-04 DIAGNOSIS — Y69 Unspecified misadventure during surgical and medical care: Secondary | ICD-10-CM | POA: Insufficient documentation

## 2019-03-04 DIAGNOSIS — T8131XA Disruption of external operation (surgical) wound, not elsewhere classified, initial encounter: Secondary | ICD-10-CM | POA: Insufficient documentation

## 2019-03-04 DIAGNOSIS — Z96651 Presence of right artificial knee joint: Secondary | ICD-10-CM | POA: Insufficient documentation

## 2019-03-04 DIAGNOSIS — Z7984 Long term (current) use of oral hypoglycemic drugs: Secondary | ICD-10-CM | POA: Diagnosis not present

## 2019-03-04 DIAGNOSIS — Z85828 Personal history of other malignant neoplasm of skin: Secondary | ICD-10-CM | POA: Diagnosis not present

## 2019-03-04 DIAGNOSIS — G510 Bell's palsy: Secondary | ICD-10-CM | POA: Insufficient documentation

## 2019-03-04 LAB — COMPREHENSIVE METABOLIC PANEL
ALT: 13 U/L (ref 0–44)
AST: 18 U/L (ref 15–41)
Albumin: 4.1 g/dL (ref 3.5–5.0)
Alkaline Phosphatase: 60 U/L (ref 38–126)
Anion gap: 11 (ref 5–15)
BUN: 9 mg/dL (ref 8–23)
CO2: 27 mmol/L (ref 22–32)
Calcium: 9.8 mg/dL (ref 8.9–10.3)
Chloride: 98 mmol/L (ref 98–111)
Creatinine, Ser: 0.85 mg/dL (ref 0.61–1.24)
GFR calc Af Amer: >60 mL/min (ref >60)
GFR calc non Af Amer: >60 mL/min (ref >60)
Glucose, Bld: 103 mg/dL — ABNORMAL HIGH (ref 70–99)
Potassium: 4.5 mmol/L (ref 3.5–5.1)
Sodium: 136 mmol/L (ref 135–145)
Total Bilirubin: 0.8 mg/dL (ref 0.3–1.2)
Total Protein: 8.2 g/dL — ABNORMAL HIGH (ref 6.5–8.1)

## 2019-03-04 LAB — CBC WITH DIFFERENTIAL/PLATELET
Abs Immature Granulocytes: 0.04 10*3/uL (ref 0.00–0.07)
Basophils Absolute: 0 10*3/uL (ref 0.0–0.1)
Basophils Relative: 0 %
Eosinophils Absolute: 0 10*3/uL (ref 0.0–0.5)
Eosinophils Relative: 0 %
HCT: 45.5 % (ref 39.0–52.0)
Hemoglobin: 15 g/dL (ref 13.0–17.0)
Immature Granulocytes: 1 %
Lymphocytes Relative: 8 %
Lymphs Abs: 0.7 10*3/uL (ref 0.7–4.0)
MCH: 31.1 pg (ref 26.0–34.0)
MCHC: 33 g/dL (ref 30.0–36.0)
MCV: 94.2 fL (ref 80.0–100.0)
Monocytes Absolute: 1.1 10*3/uL — ABNORMAL HIGH (ref 0.1–1.0)
Monocytes Relative: 13 %
Neutro Abs: 6.7 10*3/uL (ref 1.7–7.7)
Neutrophils Relative %: 78 %
Platelets: 231 10*3/uL (ref 150–400)
RBC: 4.83 MIL/uL (ref 4.22–5.81)
RDW: 12.9 % (ref 11.5–15.5)
WBC: 8.5 10*3/uL (ref 4.0–10.5)
nRBC: 0 % (ref 0.0–0.2)

## 2019-03-04 LAB — URINALYSIS, ROUTINE W REFLEX MICROSCOPIC
Bilirubin Urine: NEGATIVE
Glucose, UA: NEGATIVE mg/dL
Hgb urine dipstick: NEGATIVE
Ketones, ur: 5 mg/dL — AB
Leukocytes,Ua: NEGATIVE
Nitrite: NEGATIVE
Protein, ur: NEGATIVE mg/dL
Specific Gravity, Urine: 1.008 (ref 1.005–1.030)
pH: 8 (ref 5.0–8.0)

## 2019-03-04 LAB — PROTIME-INR
INR: 1.1 (ref 0.8–1.2)
Prothrombin Time: 14.1 seconds (ref 11.4–15.2)

## 2019-03-04 LAB — LACTIC ACID, PLASMA: Lactic Acid, Venous: 1.4 mmol/L (ref 0.5–1.9)

## 2019-03-04 NOTE — ED Notes (Addendum)
PTAR at bedside to transport patient home. Patient has possession of personal belongings at time of discharge. Provided crutches to patient. Instructions on how to use crutches given to patient.

## 2019-03-04 NOTE — ED Notes (Signed)
Pt undressed and placed on monitor. Ali Lowe, RN aware of 100.3 rectal temp.

## 2019-03-04 NOTE — Discharge Instructions (Addendum)
We saw in the ER for bleeding from your surgery site. Please follow-up with the orthopedic surgeon on Monday or Tuesday. Ensure that you are hydrating herself well.  Return to the ER immediately if you start having severe pain, fevers, chills.

## 2019-03-04 NOTE — ED Notes (Signed)
Pt has urinal at bedside and knows a urine specimen is needed.

## 2019-03-04 NOTE — ED Provider Notes (Signed)
[facility] DEPT Provider Note   CSN: [id number] Arrival date & time: 03/04/19  1520    History   Chief Complaint Chief Complaint  Patient presents with  . Right knee Post Op Infection    HPI [name] is a 68 y.o. male.     HPI  68 year old male comes in with chief complaint of right knee bleeding.  He is 2 days postop from right knee arthroplasty.  Patient has history of diabetes.  Patient reports that earlier today he felt a pulling type sensation in his surgical leg followed by bleeding.  He had dark red blood draining down his leg which prompted him to come to the ER.  In the ED patient is noted to have a low-grade temp.  He personally denies any fevers, chills, nausea, vomiting.  He continues to have throbbing pain in his knee, which is not new.  Patient also denies any chest pain, cough, shortness of breath.   Past Medical History:  Diagnosis Date  . Alcohol abuse    until age 9  . Arthritis   . Bell's palsy 05/2017  . BPH (benign prostatic hyperplasia)   . Cervical spondylosis with myelopathy   . 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 Chronic hepatitis C without hepatic coma ([place]) FOLLOWED BY INFECTOUS DISEASE DR COMER   POSITIVE ANTIBODY TEST THIS YEAR 2016--  CURRENT TX ON HARVONI PO- completeed 2016  . Chronic low back pain   . Collapsed lung    right .Marland Kitchen..15 yrs ago, fell and hit some bricks  . DDD (degenerative disc disease), lumbosacral   . Depression    no meds now- med used for bladder  . Diabetes mellitus without complication (HCC)    Type II, does not check cbg at home   . ED (erectile dysfunction)   . Foley catheter in place    removed, post lumbar surgery- cath left in throughh rehab- had 2 UTI's  . Full dentures   . GERD (gastroesophageal reflux disease)    hx none in long time  . H/O neck surgery    back of neck spine anterior entry 12-26-18 , 03-01-2019 reports  s/p neck swelling of surgical site  that impair swallowing , patiwnr states "i will swallow and food with come beack up and come up into my nose "   . History of cocaine abuse ([place])    per pt quit and last used 06-03-2013  . History of radiation therapy 07/11/18- 09/01/18   Oropharynx/ treated to 60 Gy in 30 fractions of 2 Gy  . Lymphoma ([place])   . Urinary retention   . Wears glasses     Patient Active Problem List   Diagnosis Date Noted  . Primary localized osteoarthritis of right knee 03/02/2019  . Osteoarthritis of right knee 01/26/2019  . Cervical spondylosis with myelopathy and radiculopathy 12/28/2018  . 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 Rectal bleeding 05/30/2018  . Dark stools 05/30/2018  . NSAID long-term use 05/30/2018  . Secondary malignancy of lymph nodes of head, face and neck ([place]) 05/12/2018  . 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 Cervical stenosis of spinal canal 05/10/2018  . Neck mass 04/21/2018  . Liver fibrosis 04/05/2018  . Fusion of lumbar spine 02/07/2017  . Constipation due to opioid therapy 01/27/2017  . Lumbar stenosis with  neurogenic claudication 01/24/2017  . Enlarged prostate with urinary obstruction 06/16/2015  . Lumbar degenerative disc disease 03/05/2015  . Chronic hepatitis C without hepatic coma ([place]) 12/09/2014  . Chronic low back pain 01/17/2014  . Osteoarthritis of left knee 09/21/2013  . Alcohol dependence ([place]) 06/12/2013  . Cocaine dependence ([place]) 06/12/2013  . Substance induced mood disorder ([place]) 06/12/2013    Past Surgical History:  Procedure Laterality Date  . ACDF    . ANTERIOR CERVICAL DECOMP/DISCECTOMY FUSION N/A 12/28/2018   Procedure: ANTERIOR CERVICAL DECOMPRESSION/DISCECTOMY Trinidad Curet PROSTHESIS,PLATE/SCREWS CERVICAL THREE- CERVICAL FOUR EXPLORE FUSION WITH REMOVAL OLD HARDWARE;  Surgeon: Newman Pies, MD;  Location: [facility];   Service: Neurosurgery;  Laterality: N/A;  . APPENDECTOMY  1980's  . BACK SURGERY     lower back x2  . [place]  . CLOSED REDUCTION AND INTRAMAXILLARY FIXATION BILATERAL MENTAL FX'S/  PLACEMENT MAXILLARY STENT/ APERTURE WIRE PLACEMENT  01-04-2000  . DIRECT LARYNGOSCOPY N/A 04/21/2018   Procedure: DIRECT LARYNGOSCOPY WITH BIOPSY OF TONGUE BASE AND NASOPHARYNX;  Surgeon: Jerrell Belfast, MD;  Location: [facility];  Service: ENT;  Laterality: N/A;  Direct laryngoscopy with biopsy of tongue base and nasopharynx  . JOINT REPLACEMENT    . MULTIPLE TOOTH EXTRACTIONS    . POSTERIOR LUMBAR FUSION  03-05-2015   laminectomy /  nerve decompression--  L4 -- S1  . RADICAL NECK DISSECTION Right 05/31/2018   Procedure: RADICAL MODIFIED RADICAL NECK DISSECTION;  Surgeon: Jerrell Belfast, MD;  Location: [facility];  Service: ENT;  Laterality: Right;  . RADIOLOGY WITH ANESTHESIA N/A 08/08/2018   Procedure: MRI CERVICAL SPINE WITHOUT CONTRAST;  Surgeon: Radiologist, Medication, MD;  Location: [facility];  Service: Radiology;  Laterality: N/A;  . TONSILLECTOMY  04/21/2018   Procedure: RIGHT TONSILLECTOMY;  Surgeon: Jerrell Belfast, MD;  Location: [facility];  Service: ENT;;  . TONSILLECTOMY    . TOTAL KNEE ARTHROPLASTY Left 09/21/2013   Procedure: LEFT TOTAL KNEE ARTHROPLASTY;  Surgeon: Yvette Rack., MD;  Location: [facility];  Service: Orthopedics;  Laterality: Left;  . TRANSURETHRAL RESECTION OF PROSTATE N/A 06/16/2015   Procedure: TRANSURETHRAL RESECTION OF THE PROSTATE WITH GYRUS INSTRUMENTS;  Surgeon: Franchot Gallo, MD;  Location: [facility];  Service: Urology;  Laterality: N/A;        Home Medications    Prior to Admission medications   Medication Sig Start Date End Date Taking? Authorizing Provider  apixaban (ELIQUIS) 2.5 MG TABS tablet Take 1 tablet (2.5 mg total) by mouth 2 (two) times daily for 14 days. 03/02/19 03/16/19 Yes Chadwell, Vonna Kotyk, PA-C  atorvastatin (LIPITOR) 20 MG  tablet Take 20 mg by mouth at bedtime. 01/04/19  Yes [provider]  cyclobenzaprine (FLEXERIL) 10 MG tablet Take 1 tablet (10 mg total) by mouth 3 (three) times daily as needed for muscle spasms. 12/29/18  Yes Newman Pies, MD  docusate sodium (COLACE) 100 MG capsule Take 1 capsule (100 mg total) by mouth 2 (two) times daily. 12/29/18  Yes Newman Pies, MD  gabapentin (NEURONTIN) 300 MG capsule Take 300 mg by mouth 3 (three) times daily.   Yes [provider]  metFORMIN (GLUMETZA) 1000 MG (MOD) 24 hr tablet Take 1,000 mg by mouth 2 (two) times a day.   Yes [provider]  Oxycodone HCl 10 MG TABS Take 1 tablet (10 mg total) by mouth every 4 (four) hours as needed ((score 7 to 10)). 03/02/19  Yes Chadwell, Jinny Sanders    Family History Family  History  Problem Relation Age of Onset  . Liver cancer Paternal Grandmother   . Bone cancer Paternal Grandfather   . Stomach cancer Paternal Uncle   . Colon cancer Neg Hx   . Esophageal cancer Neg Hx   . Pancreatic cancer Neg Hx   . Inflammatory bowel disease Neg Hx   . Rectal cancer Neg Hx     Social History Social History   Tobacco Use  . Smoking status: Never Smoker  . Smokeless tobacco: Never Used  Substance Use Topics  . Alcohol use: Yes    Comment: occasional  beer- 12 pack beer monthly   . Drug use: Not Currently    Types: "Crack" cocaine    Comment: per pt quit and last used crack 04/16/17     Allergies   Patient has no known allergies.   Review of Systems Review of Systems  Constitutional: Negative for activity change.  Musculoskeletal: Positive for arthralgias.  Skin: Positive for wound.  Allergic/Immunologic: Negative for immunocompromised state.  Hematological: Does not bruise/bleed easily.     Physical Exam Updated Vital Signs BP (!) 144/82 (BP Location: Left Arm)   Pulse 87   Temp 99.8 F (37.7 C) (Oral)   Resp 17   Ht 6' (1.829 m)   Wt 72.5 kg   SpO2 100%   BMI 21.68 kg/m    Physical Exam Vitals signs and nursing note reviewed.  Constitutional:      Appearance: He is well-developed.  HENT:     Head: Atraumatic.  Neck:     Musculoskeletal: Neck supple.  Cardiovascular:     Rate and Rhythm: Normal rate.  Pulmonary:     Effort: Pulmonary effort is normal.  Skin:    General: Skin is warm.  Neurological:     Mental Status: He is alert and oriented to person, place, and time.      ED Treatments / Results  Labs (all labs ordered are listed, but only abnormal results are displayed) Labs Reviewed  COMPREHENSIVE METABOLIC PANEL - Abnormal; Notable for the following components:      Result Value   Glucose, Bld 103 (*)    Total Protein 8.2 (*)    All other components within normal limits  CBC WITH DIFFERENTIAL/PLATELET - Abnormal; Notable for the following components:   Monocytes Absolute 1.1 (*)    All other components within normal limits  URINALYSIS, ROUTINE W REFLEX MICROSCOPIC - Abnormal; Notable for the following components:   Ketones, ur 5 (*)    All other components within normal limits  LACTIC ACID, PLASMA  PROTIME-INR    EKG None  Radiology No results found.  Procedures Procedures (including critical care time)  Medications Ordered in ED Medications - No data to display   Initial Impression / Assessment and Plan / ED Course  I have reviewed the triage vital signs and the nursing notes.  Pertinent labs & imaging results that were available during my care of the patient were reviewed by me and considered in my medical decision making (see chart for details).         68 year old comes in a chief complaint of postop evaluation.   He is post op day #2 from right-sided knee arthroplasty.  He is having bleeding from these surgical wound site.  There is edema and erythema along with ecchymosis, as expected.  Given that he is only 2 days postop, suspicion for infection is extremely low.  No evidence of any purulent drainage.  He has  a low-grade fever that is likely reactionary.  I did discuss the case with Dr. Mardelle Matte, orthopedic surgery.  He would want patient to be seen by Dr. French Ana the orthopedic surgeon who completed the procedure either on Monday or Tuesday.  Patient is tachycardic, but he has no chest pain, shortness of breath. Screening labs ordered.  8:36 PM CBC showing no white count elevation. We have changed the dressing in the ED.  Patient is stable for discharge.  Is requesting crutches which have been provided.  Of note, I surveyed the wound myself there is no wound dehiscence that I could appreciate.   Final Clinical Impressions(s) / ED Diagnoses   Final diagnoses:  Postoperative dehiscence of skin wound, initial encounter    ED Discharge Orders    None       Varney Biles, MD 03/04/19 2036

## 2019-03-04 NOTE — ED Notes (Signed)
Bladder scanned pt due to pt not voiding and pt denying the need to void. Found 926mL in pt's bladder. Lovena Le, RN aware.

## 2019-03-04 NOTE — ED Triage Notes (Signed)
Patient arrived via PTAR from home. Patient is AOx4 and currently non-ambulatory due to Right Knee replacement. Patient neighbor called [phone] due to seeing patient knee swollen, tender and bleeding. When PTAR arrived on scene, PTAR noticed an excess of beer cans around patient area. Patient denies taking any pain medication today.   Surgery on right knee was July 31st.

## 2019-03-04 NOTE — ED Notes (Signed)
[facility] contacted for transport. Discharge instructions reviewed with patient. Patient denies any needs or questions at this time. Paperwork printed and at nursing station. Will continue to monitor patient.

## 2019-03-04 NOTE — ED Notes (Signed)
Made EDP Nanavati aware of pt's bladder scan. Pt able to void 365mL into urinal after bladder scan.

## 2019-03-05 ENCOUNTER — Encounter (HOSPITAL_COMMUNITY): Payer: Self-pay | Admitting: Orthopedic Surgery

## 2019-03-08 ENCOUNTER — Other Ambulatory Visit (HOSPITAL_COMMUNITY): Payer: Self-pay | Admitting: Orthopedic Surgery

## 2019-03-08 ENCOUNTER — Inpatient Hospital Stay (HOSPITAL_COMMUNITY): Admission: RE | Admit: 2019-03-08 | Payer: Medicare HMO | Source: Ambulatory Visit

## 2019-03-08 DIAGNOSIS — M79604 Pain in right leg: Secondary | ICD-10-CM

## 2019-03-12 ENCOUNTER — Ambulatory Visit (HOSPITAL_COMMUNITY)
Admission: RE | Admit: 2019-03-12 | Payer: Medicare HMO | Source: Ambulatory Visit | Attending: Orthopedic Surgery | Admitting: Orthopedic Surgery

## 2019-03-15 ENCOUNTER — Other Ambulatory Visit: Payer: Self-pay

## 2019-03-15 ENCOUNTER — Ambulatory Visit (HOSPITAL_COMMUNITY)
Admission: RE | Admit: 2019-03-15 | Discharge: 2019-03-15 | Disposition: A | Discharge status: Home / Self Care | Payer: Medicare HMO | Source: Ambulatory Visit | Attending: Cardiovascular Disease | Admitting: Cardiovascular Disease

## 2019-03-15 DIAGNOSIS — M7989 Other specified soft tissue disorders: Secondary | ICD-10-CM | POA: Insufficient documentation

## 2019-03-15 DIAGNOSIS — M79604 Pain in right leg: Secondary | ICD-10-CM | POA: Diagnosis present

## 2019-05-23 NOTE — Progress Notes (Signed)
error 

## 2019-05-24 ENCOUNTER — Telehealth: Payer: Self-pay | Admitting: *Deleted

## 2019-05-24 NOTE — Telephone Encounter (Signed)
Called patient to remind of lab on 05-25-19 @ 2 pm, no answer unable to leave message

## 2019-05-25 ENCOUNTER — Ambulatory Visit
Admission: RE | Admit: 2019-05-25 | Discharge: 2019-05-25 | Disposition: A | Discharge status: Home / Self Care | Payer: Medicare HMO | Source: Ambulatory Visit | Attending: Radiation Oncology | Admitting: Radiation Oncology

## 2019-05-25 ENCOUNTER — Ambulatory Visit: Payer: Medicare HMO

## 2019-06-01 ENCOUNTER — Ambulatory Visit
Admission: RE | Admit: 2019-06-01 | Discharge: 2019-06-01 | Disposition: A | Discharge status: Home / Self Care | Payer: Medicare HMO | Source: Ambulatory Visit | Attending: Radiation Oncology | Admitting: Radiation Oncology

## 2019-06-01 ENCOUNTER — Ambulatory Visit: Payer: Medicare HMO | Attending: Radiation Oncology

## 2019-06-01 ENCOUNTER — Telehealth: Payer: Self-pay | Admitting: *Deleted

## 2019-06-01 DIAGNOSIS — C770 Secondary and unspecified malignant neoplasm of lymph nodes of head, face and neck: Secondary | ICD-10-CM | POA: Insufficient documentation

## 2019-06-01 NOTE — Telephone Encounter (Signed)
Called patient to ask about missing today's lab and fu, no answer, unable to leave message

## 2019-07-12 NOTE — Progress Notes (Signed)
duplicate

## 2019-07-13 ENCOUNTER — Telehealth: Payer: Self-pay | Admitting: *Deleted

## 2019-07-13 ENCOUNTER — Ambulatory Visit
Admission: RE | Admit: 2019-07-13 | Discharge: 2019-07-13 | Disposition: A | Discharge status: Home / Self Care | Payer: Medicare HMO | Source: Ambulatory Visit | Attending: Radiation Oncology | Admitting: Radiation Oncology

## 2019-07-13 NOTE — Telephone Encounter (Signed)
Called patient's daughterThreasa Beards to inform of fu being rescheduled for 07-20-19 @ 11:20 am, lvm for a return call

## 2019-07-20 ENCOUNTER — Ambulatory Visit
Admission: RE | Admit: 2019-07-20 | Discharge: 2019-07-20 | Disposition: A | Discharge status: Home / Self Care | Payer: Medicare HMO | Source: Ambulatory Visit | Attending: Radiation Oncology | Admitting: Radiation Oncology

## 2020-06-22 ENCOUNTER — Encounter (HOSPITAL_COMMUNITY)
Admission: EM | Disposition: A | Discharge status: Home / Self Care | Payer: Self-pay | Source: Home / Self Care | Attending: Emergency Medicine

## 2020-06-22 ENCOUNTER — Other Ambulatory Visit: Payer: Self-pay

## 2020-06-22 ENCOUNTER — Emergency Department (HOSPITAL_COMMUNITY): Payer: Medicare Other

## 2020-06-22 ENCOUNTER — Observation Stay (HOSPITAL_COMMUNITY): Payer: Medicare Other | Admitting: Anesthesiology

## 2020-06-22 ENCOUNTER — Encounter (HOSPITAL_COMMUNITY): Payer: Self-pay

## 2020-06-22 ENCOUNTER — Observation Stay (HOSPITAL_COMMUNITY)
Admission: EM | Admit: 2020-06-22 | Discharge: 2020-06-24 | Disposition: A | Discharge status: Home / Self Care | Payer: Medicare Other | Attending: Physician Assistant | Admitting: Physician Assistant

## 2020-06-22 DIAGNOSIS — S0990XA Unspecified injury of head, initial encounter: Secondary | ICD-10-CM | POA: Diagnosis not present

## 2020-06-22 DIAGNOSIS — R52 Pain, unspecified: Secondary | ICD-10-CM

## 2020-06-22 DIAGNOSIS — T07XXXA Unspecified multiple injuries, initial encounter: Secondary | ICD-10-CM

## 2020-06-22 DIAGNOSIS — S40022A Contusion of left upper arm, initial encounter: Secondary | ICD-10-CM | POA: Diagnosis not present

## 2020-06-22 DIAGNOSIS — S60222A Contusion of left hand, initial encounter: Secondary | ICD-10-CM | POA: Diagnosis not present

## 2020-06-22 DIAGNOSIS — T1490XA Injury, unspecified, initial encounter: Secondary | ICD-10-CM

## 2020-06-22 DIAGNOSIS — S2249XA Multiple fractures of ribs, unspecified side, initial encounter for closed fracture: Secondary | ICD-10-CM | POA: Diagnosis present

## 2020-06-22 DIAGNOSIS — S2243XA Multiple fractures of ribs, bilateral, initial encounter for closed fracture: Secondary | ICD-10-CM | POA: Diagnosis not present

## 2020-06-22 DIAGNOSIS — J9690 Respiratory failure, unspecified, unspecified whether with hypoxia or hypercapnia: Secondary | ICD-10-CM

## 2020-06-22 DIAGNOSIS — E119 Type 2 diabetes mellitus without complications: Secondary | ICD-10-CM | POA: Diagnosis not present

## 2020-06-22 DIAGNOSIS — M25539 Pain in unspecified wrist: Secondary | ICD-10-CM

## 2020-06-22 DIAGNOSIS — Z7984 Long term (current) use of oral hypoglycemic drugs: Secondary | ICD-10-CM | POA: Insufficient documentation

## 2020-06-22 DIAGNOSIS — S36112A Contusion of liver, initial encounter: Secondary | ICD-10-CM | POA: Insufficient documentation

## 2020-06-22 DIAGNOSIS — S299XXA Unspecified injury of thorax, initial encounter: Secondary | ICD-10-CM | POA: Diagnosis present

## 2020-06-22 DIAGNOSIS — R160 Hepatomegaly, not elsewhere classified: Secondary | ICD-10-CM

## 2020-06-22 DIAGNOSIS — S8012XA Contusion of left lower leg, initial encounter: Secondary | ICD-10-CM | POA: Diagnosis not present

## 2020-06-22 DIAGNOSIS — D1803 Hemangioma of intra-abdominal structures: Secondary | ICD-10-CM | POA: Diagnosis not present

## 2020-06-22 DIAGNOSIS — Z7901 Long term (current) use of anticoagulants: Secondary | ICD-10-CM | POA: Insufficient documentation

## 2020-06-22 DIAGNOSIS — R933 Abnormal findings on diagnostic imaging of other parts of digestive tract: Secondary | ICD-10-CM | POA: Diagnosis not present

## 2020-06-22 DIAGNOSIS — S20222A Contusion of left back wall of thorax, initial encounter: Secondary | ICD-10-CM | POA: Insufficient documentation

## 2020-06-22 DIAGNOSIS — Y990 Civilian activity done for income or pay: Secondary | ICD-10-CM | POA: Insufficient documentation

## 2020-06-22 DIAGNOSIS — Z79899 Other long term (current) drug therapy: Secondary | ICD-10-CM | POA: Diagnosis not present

## 2020-06-22 DIAGNOSIS — K449 Diaphragmatic hernia without obstruction or gangrene: Secondary | ICD-10-CM | POA: Insufficient documentation

## 2020-06-22 DIAGNOSIS — N390 Urinary tract infection, site not specified: Secondary | ICD-10-CM

## 2020-06-22 DIAGNOSIS — J939 Pneumothorax, unspecified: Secondary | ICD-10-CM | POA: Diagnosis not present

## 2020-06-22 DIAGNOSIS — R970 Elevated carcinoembryonic antigen [CEA]: Secondary | ICD-10-CM | POA: Insufficient documentation

## 2020-06-22 DIAGNOSIS — Z23 Encounter for immunization: Secondary | ICD-10-CM | POA: Insufficient documentation

## 2020-06-22 DIAGNOSIS — Z20822 Contact with and (suspected) exposure to covid-19: Secondary | ICD-10-CM | POA: Insufficient documentation

## 2020-06-22 DIAGNOSIS — T182XXA Foreign body in stomach, initial encounter: Secondary | ICD-10-CM

## 2020-06-22 DIAGNOSIS — F1410 Cocaine abuse, uncomplicated: Secondary | ICD-10-CM

## 2020-06-22 DIAGNOSIS — Z96653 Presence of artificial knee joint, bilateral: Secondary | ICD-10-CM | POA: Insufficient documentation

## 2020-06-22 HISTORY — PX: ESOPHAGOGASTRODUODENOSCOPY (EGD) WITH PROPOFOL: SHX5813

## 2020-06-22 HISTORY — PX: FOREIGN BODY REMOVAL: SHX962

## 2020-06-22 LAB — COMPREHENSIVE METABOLIC PANEL
ALT: 128 U/L — ABNORMAL HIGH (ref 0–44)
AST: 308 U/L — ABNORMAL HIGH (ref 15–41)
Albumin: 3.9 g/dL (ref 3.5–5.0)
Alkaline Phosphatase: 58 U/L (ref 38–126)
Anion gap: 12 (ref 5–15)
BUN: 12 mg/dL (ref 8–23)
CO2: 25 mmol/L (ref 22–32)
Calcium: 9.6 mg/dL (ref 8.9–10.3)
Chloride: 99 mmol/L (ref 98–111)
Creatinine, Ser: 1.22 mg/dL (ref 0.61–1.24)
GFR, Estimated: >60 mL/min (ref >60)
Glucose, Bld: 123 mg/dL — ABNORMAL HIGH (ref 70–99)
Potassium: 4.4 mmol/L (ref 3.5–5.1)
Sodium: 136 mmol/L (ref 135–145)
Total Bilirubin: 0.6 mg/dL (ref 0.3–1.2)
Total Protein: 6.4 g/dL — ABNORMAL LOW (ref 6.5–8.1)

## 2020-06-22 LAB — I-STAT CHEM 8, ED
BUN: 17 mg/dL (ref 8–23)
Calcium, Ion: 1.02 mmol/L — ABNORMAL LOW (ref 1.15–1.40)
Chloride: 104 mmol/L (ref 98–111)
Creatinine, Ser: 1.1 mg/dL (ref 0.61–1.24)
Glucose, Bld: 116 mg/dL — ABNORMAL HIGH (ref 70–99)
HCT: 42 % (ref 39.0–52.0)
Hemoglobin: 14.3 g/dL (ref 13.0–17.0)
Potassium: 5.6 mmol/L — ABNORMAL HIGH (ref 3.5–5.1)
Sodium: 136 mmol/L (ref 135–145)
TCO2: 24 mmol/L (ref 22–32)

## 2020-06-22 LAB — CBC
HCT: 43.7 % (ref 39.0–52.0)
Hemoglobin: 14.3 g/dL (ref 13.0–17.0)
MCH: 31.4 pg (ref 26.0–34.0)
MCHC: 32.7 g/dL (ref 30.0–36.0)
MCV: 95.8 fL (ref 80.0–100.0)
Platelets: 215 10*3/uL (ref 150–400)
RBC: 4.56 MIL/uL (ref 4.22–5.81)
RDW: 12.9 % (ref 11.5–15.5)
WBC: 6.2 10*3/uL (ref 4.0–10.5)
nRBC: 0 % (ref 0.0–0.2)

## 2020-06-22 LAB — PROTIME-INR
INR: 1 (ref 0.8–1.2)
Prothrombin Time: 13.2 seconds (ref 11.4–15.2)

## 2020-06-22 LAB — URINALYSIS, ROUTINE W REFLEX MICROSCOPIC
Bilirubin Urine: NEGATIVE
Glucose, UA: NEGATIVE mg/dL
Ketones, ur: NEGATIVE mg/dL
Nitrite: POSITIVE — AB
Protein, ur: 100 mg/dL — AB
Specific Gravity, Urine: 1.017 (ref 1.005–1.030)
WBC, UA: >50 WBC/hpf — ABNORMAL HIGH (ref 0–5)
pH: 7 (ref 5.0–8.0)

## 2020-06-22 LAB — SAMPLE TO BLOOD BANK

## 2020-06-22 LAB — RAPID URINE DRUG SCREEN, HOSP PERFORMED
Amphetamines: NOT DETECTED
Barbiturates: NOT DETECTED
Benzodiazepines: NOT DETECTED
Cocaine: POSITIVE — AB
Opiates: NOT DETECTED
Tetrahydrocannabinol: NOT DETECTED

## 2020-06-22 LAB — RESPIRATORY PANEL BY RT PCR (FLU A&B, COVID)
Influenza A by PCR: NEGATIVE
Influenza B by PCR: NEGATIVE
SARS Coronavirus 2 by RT PCR: NEGATIVE

## 2020-06-22 LAB — HIV ANTIBODY (ROUTINE TESTING W REFLEX): HIV Screen 4th Generation wRfx: NONREACTIVE

## 2020-06-22 LAB — ETHANOL: Alcohol, Ethyl (B): <10 mg/dL (ref <10)

## 2020-06-22 LAB — LACTIC ACID, PLASMA: Lactic Acid, Venous: 1.8 mmol/L (ref 0.5–1.9)

## 2020-06-22 SURGERY — ESOPHAGOGASTRODUODENOSCOPY (EGD) WITH PROPOFOL
Anesthesia: General

## 2020-06-22 MED ORDER — PHENYLEPHRINE HCL (PRESSORS) 10 MG/ML IV SOLN
INTRAVENOUS | Status: DC | PRN
  Administered 2020-06-22 (×2): 80 ug via INTRAVENOUS
  Administered 2020-06-22: 120 ug via INTRAVENOUS

## 2020-06-22 MED ORDER — OXYCODONE HCL 5 MG PO TABS
5.0000 mg | ORAL_TABLET | ORAL | Status: DC | PRN
  Administered 2020-06-22 – 2020-06-23 (×5): 10 mg via ORAL
  Administered 2020-06-24: 5 mg via ORAL
  Administered 2020-06-24: 10 mg via ORAL
  Administered 2020-06-24: 5 mg via ORAL
  Filled 2020-06-22 (×5): qty 2
  Filled 2020-06-22: qty 1
  Filled 2020-06-22: qty 2
  Filled 2020-06-22 (×2): qty 1

## 2020-06-22 MED ORDER — SODIUM CHLORIDE 0.9 % IV SOLN
1.0000 g | Freq: Once | INTRAVENOUS | Status: AC
  Administered 2020-06-22: 1 g via INTRAVENOUS
  Filled 2020-06-22: qty 10

## 2020-06-22 MED ORDER — ONDANSETRON 4 MG PO TBDP: 4.0000 mg | ORAL_TABLET | Freq: Four times a day (QID) | ORAL | Status: DC | PRN

## 2020-06-22 MED ORDER — DEXAMETHASONE SODIUM PHOSPHATE 10 MG/ML IJ SOLN
INTRAMUSCULAR | Status: DC | PRN
  Administered 2020-06-22: 10 mg via INTRAVENOUS

## 2020-06-22 MED ORDER — CEFAZOLIN SODIUM-DEXTROSE 2-4 GM/100ML-% IV SOLN
2.0000 g | Freq: Once | INTRAVENOUS | Status: AC
  Administered 2020-06-22: 2 g via INTRAVENOUS
  Filled 2020-06-22: qty 100

## 2020-06-22 MED ORDER — EPHEDRINE SULFATE 50 MG/ML IJ SOLN
INTRAMUSCULAR | Status: DC | PRN
  Administered 2020-06-22 (×2): 10 mg via INTRAVENOUS

## 2020-06-22 MED ORDER — MIDAZOLAM HCL 2 MG/2ML IJ SOLN
INTRAMUSCULAR | Status: AC
  Filled 2020-06-22: qty 2

## 2020-06-22 MED ORDER — DOCUSATE SODIUM 100 MG PO CAPS
100.0000 mg | ORAL_CAPSULE | Freq: Two times a day (BID) | ORAL | Status: DC
  Administered 2020-06-22 – 2020-06-24 (×4): 100 mg via ORAL
  Filled 2020-06-22 (×4): qty 1

## 2020-06-22 MED ORDER — ROCURONIUM BROMIDE 10 MG/ML (PF) SYRINGE
PREFILLED_SYRINGE | INTRAVENOUS | Status: DC | PRN
  Administered 2020-06-22: 30 mg via INTRAVENOUS

## 2020-06-22 MED ORDER — SUCCINYLCHOLINE CHLORIDE 200 MG/10ML IV SOSY
PREFILLED_SYRINGE | INTRAVENOUS | Status: DC | PRN
  Administered 2020-06-22: 100 mg via INTRAVENOUS

## 2020-06-22 MED ORDER — FENTANYL CITRATE (PF) 250 MCG/5ML IJ SOLN
INTRAMUSCULAR | Status: DC | PRN
  Administered 2020-06-22: 50 ug via INTRAVENOUS

## 2020-06-22 MED ORDER — ACETAMINOPHEN 500 MG PO TABS
1000.0000 mg | ORAL_TABLET | Freq: Four times a day (QID) | ORAL | Status: DC
  Administered 2020-06-22 – 2020-06-24 (×9): 1000 mg via ORAL
  Filled 2020-06-22 (×9): qty 2

## 2020-06-22 MED ORDER — TETANUS-DIPHTH-ACELL PERTUSSIS 5-2.5-18.5 LF-MCG/0.5 IM SUSY
0.5000 mL | PREFILLED_SYRINGE | Freq: Once | INTRAMUSCULAR | Status: AC
  Administered 2020-06-22: 0.5 mL via INTRAMUSCULAR
  Filled 2020-06-22: qty 0.5

## 2020-06-22 MED ORDER — LIDOCAINE 2% (20 MG/ML) 5 ML SYRINGE
INTRAMUSCULAR | Status: DC | PRN
  Administered 2020-06-22: 60 mg via INTRAVENOUS

## 2020-06-22 MED ORDER — SODIUM CHLORIDE 0.9 % IV SOLN: INTRAVENOUS | Status: DC

## 2020-06-22 MED ORDER — ONDANSETRON HCL 4 MG/2ML IJ SOLN: 4.0000 mg | Freq: Four times a day (QID) | INTRAMUSCULAR | Status: DC | PRN

## 2020-06-22 MED ORDER — MORPHINE SULFATE (PF) 4 MG/ML IV SOLN: 4.0000 mg | INTRAVENOUS | Status: DC | PRN

## 2020-06-22 MED ORDER — ENOXAPARIN SODIUM 30 MG/0.3ML ~~LOC~~ SOLN
30.0000 mg | Freq: Two times a day (BID) | SUBCUTANEOUS | Status: DC
  Administered 2020-06-23 – 2020-06-24 (×3): 30 mg via SUBCUTANEOUS
  Filled 2020-06-22 (×3): qty 0.3

## 2020-06-22 MED ORDER — LORAZEPAM 2 MG/ML IJ SOLN
1.0000 mg | Freq: Once | INTRAMUSCULAR | Status: AC
  Administered 2020-06-22: 1 mg via INTRAVENOUS
  Filled 2020-06-22: qty 1

## 2020-06-22 MED ORDER — SUGAMMADEX SODIUM 200 MG/2ML IV SOLN
INTRAVENOUS | Status: DC | PRN
  Administered 2020-06-22: 300 mg via INTRAVENOUS

## 2020-06-22 MED ORDER — SODIUM CHLORIDE 0.9 % IV BOLUS
1000.0000 mL | Freq: Once | INTRAVENOUS | Status: AC
  Administered 2020-06-22: 1000 mL via INTRAVENOUS

## 2020-06-22 MED ORDER — PROPOFOL 10 MG/ML IV BOLUS
INTRAVENOUS | Status: AC
  Filled 2020-06-22: qty 20

## 2020-06-22 MED ORDER — FENTANYL CITRATE (PF) 250 MCG/5ML IJ SOLN
INTRAMUSCULAR | Status: AC
  Filled 2020-06-22: qty 5

## 2020-06-22 MED ORDER — ONDANSETRON HCL 4 MG/2ML IJ SOLN
INTRAMUSCULAR | Status: DC | PRN
  Administered 2020-06-22: 4 mg via INTRAVENOUS

## 2020-06-22 MED ORDER — IOHEXOL 300 MG/ML  SOLN
100.0000 mL | Freq: Once | INTRAMUSCULAR | Status: AC | PRN
  Administered 2020-06-22: 100 mL via INTRAVENOUS

## 2020-06-22 MED ORDER — FENTANYL CITRATE (PF) 100 MCG/2ML IJ SOLN
50.0000 ug | Freq: Once | INTRAMUSCULAR | Status: AC
  Administered 2020-06-22: 50 ug via INTRAVENOUS
  Filled 2020-06-22: qty 2

## 2020-06-22 MED ORDER — ALBUMIN HUMAN 5 % IV SOLN: INTRAVENOUS | Status: DC | PRN

## 2020-06-22 MED ORDER — PROPOFOL 10 MG/ML IV BOLUS
INTRAVENOUS | Status: DC | PRN
  Administered 2020-06-22: 100 mg via INTRAVENOUS

## 2020-06-22 MED ORDER — LACTATED RINGERS IV SOLN: INTRAVENOUS | Status: DC

## 2020-06-22 SURGICAL SUPPLY — 14 items
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
## 2020-06-22 NOTE — Anesthesia Preprocedure Evaluation (Addendum)
Anesthesia Evaluation  Patient identified by MRN, date of birth, ID band Patient awake    Reviewed: Allergy & Precautions, H&P , NPO status , Patient's Chart, lab work & pertinent test results  Airway Mallampati: III  TM Distance: >3 FB Neck ROM: Full    Dental no notable dental hx. (+) Poor Dentition, Dental Advisory Given   Pulmonary neg pulmonary ROS,    Pulmonary exam normal breath sounds clear to auscultation       Cardiovascular negative cardio ROS   Rhythm:Regular Rate:Normal     Neuro/Psych Depression negative neurological ROS     GI/Hepatic GERD  ,(+)     substance abuse  cocaine use, Hepatitis -, C  Endo/Other  diabetes  Renal/GU negative Renal ROS  negative genitourinary   Musculoskeletal  (+) Arthritis , Osteoarthritis,    Abdominal   Peds  Hematology negative hematology ROS (+)   Anesthesia Other Findings   Reproductive/Obstetrics negative OB ROS                            Anesthesia Physical Anesthesia Plan  ASA: III and emergent  Anesthesia Plan: General   Post-op Pain Management:    Induction: Intravenous, Rapid sequence and Cricoid pressure planned  PONV Risk Score and Plan: 2 and Ondansetron, Dexamethasone and Midazolam  Airway Management Planned: Oral ETT  Additional Equipment:   Intra-op Plan:   Post-operative Plan: Extubation in OR  Informed Consent: I have reviewed the patients History and Physical, chart, labs and discussed the procedure including the risks, benefits and alternatives for the proposed anesthesia with the patient or authorized representative who has indicated his/her understanding and acceptance.     Dental advisory given  Plan Discussed with: CRNA  Anesthesia Plan Comments:         Anesthesia Quick Evaluation

## 2020-06-22 NOTE — Progress Notes (Signed)
   06/22/20 0844  OTHER  Substance Abuse Education Offered Yes  Substance abuse interventions Other (must comment) (Patient declines resoures. Reports drinking 1-2 times a week. Had prior SA therapy. Reports not currently interested in further SA Tx.)  (CAGE-AID) Substance Abuse Screening Tool  Have You Ever Felt You Ought to Cut Down on Your Drinking or Drug Use? 1  Have People Annoyed You By Critizing Your Drinking Or Drug Use? 1  Have You Felt Bad Or Guilty About Your Drinking Or Drug Use? 0  Have You Ever Had a Drink or Used Drugs First Thing In The Morning to Steady Your Nerves or to Get Rid of a Hangover? 0  CAGE-AID Score 2

## 2020-06-22 NOTE — Consult Note (Signed)
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 pressure 102/70, pulse 79, temperature 98.6 F (37 C), temperature source Oral, resp. rate 17, height 6' (1.829 m), weight 72.5 kg, SpO2 100 %. Physical Exam no acute distress in neck brace exam please see preassessment evaluation abdomen minimal discomfort on palpation minimal breath sounds bilaterally labs and x-rays reviewed including CT scan  Assessment/Plan: Concerns over drug ingestion Plan: The risk benefits methods of endoscopy was discussed and also discussed with him surgical removal if needed including the risks of that procedure and will proceed with anesthesia's assistance this morning with further work-up and plans pending those findings  Aven Cegielski E 06/22/2020, 9:40 AM

## 2020-06-22 NOTE — Anesthesia Procedure Notes (Signed)
Procedure Name: Intubation Date/Time: 06/22/2020 9:54 AM Performed by: Clearnce Sorrel, CRNA Pre-anesthesia Checklist: Patient identified, Emergency Drugs available, Suction available, Timeout performed and Patient being monitored Patient Re-evaluated:Patient Re-evaluated prior to induction Oxygen Delivery Method: Circle system utilized Preoxygenation: Pre-oxygenation with 100% oxygen Induction Type: IV induction, Rapid sequence and Cricoid Pressure applied Laryngoscope Size: McGraph and 4 (limited neck ROM) Grade View: Grade I Tube type: Oral Tube size: 7.5 mm Number of attempts: 1 Airway Equipment and Method: Stylet Placement Confirmation: ETT inserted through vocal cords under direct vision,  positive ETCO2 and breath sounds checked- equal and bilateral Secured at: 23 cm Tube secured with: Tape Dental Injury: Teeth and Oropharynx as per pre-operative assessment

## 2020-06-22 NOTE — Transfer of Care (Signed)
Immediate Anesthesia Transfer of Care Note  Patient: [name]  Procedure(s) Performed: ESOPHAGOGASTRODUODENOSCOPY (EGD) WITH PROPOFOL (N/A ) FOREIGN BODY REMOVAL (N/A )  Patient Location: PACU  Anesthesia Type:General  Level of Consciousness: awake, alert  and oriented  Airway & Oxygen Therapy: Patient Spontanous Breathing and Patient connected to face mask oxygen  Post-op Assessment: Report given to RN and Post -op Vital signs reviewed and stable  Post vital signs: Reviewed and stable  Last Vitals:  Vitals Value Taken Time  BP    Temp    Pulse    Resp    SpO2      Last Pain:  Vitals:   06/22/20 0936  TempSrc: Oral  PainSc: 10-Worst pain ever      Patients Stated Pain Goal: 3 (40/98/11 9147)  Complications: No complications documented.

## 2020-06-22 NOTE — ED Notes (Signed)
Patient transported to MRI 

## 2020-06-22 NOTE — Plan of Care (Signed)
Progressing, will continue to monitor.  

## 2020-06-22 NOTE — ED Notes (Signed)
Patient transported to endoscopy. 

## 2020-06-22 NOTE — Anesthesia Postprocedure Evaluation (Signed)
Anesthesia Post Note  Patient: [name]  Procedure(s) Performed: ESOPHAGOGASTRODUODENOSCOPY (EGD) WITH PROPOFOL (N/A ) FOREIGN BODY REMOVAL (N/A )     Patient location during evaluation: PACU Anesthesia Type: General Level of consciousness: awake and alert Pain management: pain level controlled Vital Signs Assessment: post-procedure vital signs reviewed and stable Respiratory status: spontaneous breathing, nonlabored ventilation and respiratory function stable Cardiovascular status: blood pressure returned to baseline and stable Postop Assessment: no apparent nausea or vomiting Anesthetic complications: no   No complications documented.  Last Vitals:  Vitals:   06/22/20 1230 06/22/20 1245  BP: 134/85 (!) 142/93  Pulse: 74 77  Resp: 17 16  Temp:    SpO2: 100% 100%    Last Pain:  Vitals:   06/22/20 1205  TempSrc:   PainSc: Asleep                 Berna Gitto,W. EDMOND
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
## 2020-06-22 NOTE — Progress Notes (Signed)
Chaplain checked in with RN regarding chaplaincy needs. Pt not available, but RN will call if support is needed.  Luana Shu [phone]     06/22/20 0402  Clinical Encounter Type  Visited With Patient not available  Visit Type Trauma  Referral From Other (Comment) (Trauma L2)  Consult/Referral To Chaplain  Stress Factors  Patient Stress Factors Health changes  Advance Directives (For Healthcare)  Does Patient Have a Medical Advance Directive? Unable to assess, patient is non-responsive or altered mental status  Would patient like information on creating a medical advance directive? No - Patient declined  [place]  Does Patient Have a Mental Health Advance Directive? Unable to assess, patient is non-responsive or altered mental status

## 2020-06-22 NOTE — Op Note (Signed)
[facility] Patient Name: [name] Procedure Date : 06/22/2020 MRN: [id number] Attending MD: Clarene Essex , MD Date of Birth: [birth year] CSN: [id number] Age: 69 Admit Type: Inpatient Procedure:                Upper GI endoscopy Indications:              Foreign body in the stomach, Abnormal CT of the GI                            tract concerns over bags of drugs present Providers:                Clarene Essex, MD, Clyde Lundborg, RN, Particia Nearing,                            RN, Fransico Setters Mbumina, Technician Referring MD:              Medicines:                General Anesthesia Complications:            No immediate complications. Estimated Blood Loss:     Estimated blood loss: none. Procedure:                Pre-Anesthesia Assessment:                           - Prior to the procedure, a History and Physical                            was performed, and patient medications and                            allergies were reviewed. The patient's tolerance of                            previous anesthesia was also reviewed. The risks                            and benefits of the procedure and the sedation                            options and risks were discussed with the patient.                            All questions were answered, and informed consent                            was obtained. Prior Anticoagulants: The patient has                            taken no previous anticoagulant or antiplatelet                            agents. ASA Grade Assessment: III - A patient with  severe systemic disease. After reviewing the risks                            and benefits, the patient was deemed in                            satisfactory condition to undergo the procedure.                           After obtaining informed consent, the endoscope was                            passed under direct vision. Throughout the                             procedure, the patient's blood pressure, pulse, and                            oxygen saturations were monitored continuously. The                            GIF-H190 ([id number] Olympus gastroscope was                            introduced through the mouth, and advanced to the                            second part of duodenum. The upper GI endoscopy was                            accomplished without difficulty. The patient                            tolerated the procedure well. Scope In: Scope Out: Findings:      A tiny hiatal hernia was present.      A medium amount of food (residue) was found in the gastric fundus and       proximal stomach. Removal of food was partially accomplished using the       largest Roth net we had an grabbing to moderate amounts and withdrawing       the scope and they were analyzed outside the body and contained only       vegetable matter and after using the Jabier Mutton net three times we elected to       not remove any further since no obvious bags were seen.      The duodenal bulb, first portion of the duodenum and second portion of       the duodenum were normal.      The exam was otherwise without abnormality. Impression:               - Tiny hiatal hernia.                           - A medium amount of food (residue) in the stomach.  Partial removal was successful.                           - Normal duodenal bulb, first portion of the                            duodenum and second portion of the duodenum.                           - The examination was otherwise normal. Recommendation:           - Clear liquid diet today if okay with surgical                            team. And further advancement per them                           - Continue present medications.                           - Return to GI clinic PRN.                           - Telephone GI clinic if symptomatic PRN. Please                            call me if I  can be of any further assistance with                            this hospital stay Procedure Code(s):        --- Professional ---                           [phone], Esophagogastroduodenoscopy, flexible,                            transoral; with removal of foreign body(s) Diagnosis Code(s):        --- Professional ---                           K44.9, Diaphragmatic hernia without obstruction or                            gangrene                           T18.2XXA, Foreign body in stomach, initial encounter                           R93.3, Abnormal findings on diagnostic imaging of                            other parts of digestive tract CPT copyright 2019 [employer]. All rights reserved. The codes documented in this report are preliminary and upon coder review may  be revised to meet current compliance requirements. Clarene Essex, MD 06/22/2020  10:19:59 AM This report has been signed electronically. Number of Addenda: 0

## 2020-06-23 ENCOUNTER — Observation Stay (HOSPITAL_COMMUNITY): Payer: Medicare Other

## 2020-06-23 ENCOUNTER — Encounter (HOSPITAL_COMMUNITY): Payer: Self-pay | Admitting: Gastroenterology

## 2020-06-23 DIAGNOSIS — S2243XA Multiple fractures of ribs, bilateral, initial encounter for closed fracture: Secondary | ICD-10-CM | POA: Diagnosis not present

## 2020-06-23 LAB — BASIC METABOLIC PANEL
Anion gap: 11 (ref 5–15)
BUN: 10 mg/dL (ref 8–23)
CO2: 24 mmol/L (ref 22–32)
Calcium: 9.6 mg/dL (ref 8.9–10.3)
Chloride: 103 mmol/L (ref 98–111)
Creatinine, Ser: 0.99 mg/dL (ref 0.61–1.24)
GFR, Estimated: >60 mL/min (ref >60)
Glucose, Bld: 137 mg/dL — ABNORMAL HIGH (ref 70–99)
Potassium: 4.5 mmol/L (ref 3.5–5.1)
Sodium: 138 mmol/L (ref 135–145)

## 2020-06-23 LAB — CBC
HCT: 44.1 % (ref 39.0–52.0)
Hemoglobin: 14.4 g/dL (ref 13.0–17.0)
MCH: 30.7 pg (ref 26.0–34.0)
MCHC: 32.7 g/dL (ref 30.0–36.0)
MCV: 94 fL (ref 80.0–100.0)
Platelets: 158 10*3/uL (ref 150–400)
RBC: 4.69 MIL/uL (ref 4.22–5.81)
RDW: 13.1 % (ref 11.5–15.5)
WBC: 7.3 10*3/uL (ref 4.0–10.5)
nRBC: 0 % (ref 0.0–0.2)

## 2020-06-23 LAB — HEMOGLOBIN A1C
Hgb A1c MFr Bld: 5.4 % (ref 4.8–5.6)
Mean Plasma Glucose: 108.28 mg/dL

## 2020-06-23 LAB — GLUCOSE, CAPILLARY
Glucose-Capillary: 122 mg/dL — ABNORMAL HIGH (ref 70–99)
Glucose-Capillary: 89 mg/dL (ref 70–99)
Glucose-Capillary: 108 mg/dL — ABNORMAL HIGH (ref 70–99)

## 2020-06-23 MED ORDER — INSULIN ASPART 100 UNIT/ML ~~LOC~~ SOLN
0.0000 [IU] | Freq: Three times a day (TID) | SUBCUTANEOUS | Status: DC
  Administered 2020-06-23: 2 [IU] via SUBCUTANEOUS

## 2020-06-23 MED ORDER — POLYETHYLENE GLYCOL 3350 17 G PO PACK
17.0000 g | PACK | Freq: Every day | ORAL | Status: DC
  Administered 2020-06-23 – 2020-06-24 (×2): 17 g via ORAL
  Filled 2020-06-23 (×2): qty 1

## 2020-06-23 MED ORDER — LORAZEPAM 2 MG/ML IJ SOLN
1.0000 mg | Freq: Once | INTRAMUSCULAR | Status: AC | PRN
  Administered 2020-06-23: 1 mg via INTRAVENOUS
  Filled 2020-06-23: qty 1

## 2020-06-23 MED ORDER — METHOCARBAMOL 500 MG PO TABS
1000.0000 mg | ORAL_TABLET | Freq: Three times a day (TID) | ORAL | Status: DC
  Administered 2020-06-23 – 2020-06-24 (×5): 1000 mg via ORAL
  Filled 2020-06-23 (×5): qty 2

## 2020-06-23 MED ORDER — GADOBUTROL 1 MMOL/ML IV SOLN
7.0000 mL | Freq: Once | INTRAVENOUS | Status: AC | PRN
  Administered 2020-06-23: 7 mL via INTRAVENOUS

## 2020-06-23 MED ORDER — KETOROLAC TROMETHAMINE 15 MG/ML IJ SOLN
15.0000 mg | Freq: Four times a day (QID) | INTRAMUSCULAR | Status: DC
  Administered 2020-06-23 – 2020-06-24 (×7): 15 mg via INTRAVENOUS
  Filled 2020-06-23 (×7): qty 1

## 2020-06-23 MED ORDER — CEPHALEXIN 500 MG PO CAPS
500.0000 mg | ORAL_CAPSULE | Freq: Two times a day (BID) | ORAL | Status: DC
  Administered 2020-06-23 – 2020-06-24 (×3): 500 mg via ORAL
  Filled 2020-06-23 (×3): qty 1
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
## 2020-06-24 DIAGNOSIS — S2243XA Multiple fractures of ribs, bilateral, initial encounter for closed fracture: Secondary | ICD-10-CM | POA: Diagnosis not present

## 2020-06-24 LAB — BASIC METABOLIC PANEL
Anion gap: 13 (ref 5–15)
BUN: 15 mg/dL (ref 8–23)
CO2: 24 mmol/L (ref 22–32)
Calcium: 10 mg/dL (ref 8.9–10.3)
Chloride: 103 mmol/L (ref 98–111)
Creatinine, Ser: 1.12 mg/dL (ref 0.61–1.24)
GFR, Estimated: >60 mL/min (ref >60)
Glucose, Bld: 70 mg/dL (ref 70–99)
Potassium: 5.2 mmol/L — ABNORMAL HIGH (ref 3.5–5.1)
Sodium: 140 mmol/L (ref 135–145)

## 2020-06-24 LAB — AFP TUMOR MARKER: AFP, Serum, Tumor Marker: 2.9 ng/mL (ref 0.0–8.3)

## 2020-06-24 LAB — URINE CULTURE: Culture: <10000 — AB

## 2020-06-24 LAB — CBC
HCT: 39.8 % (ref 39.0–52.0)
Hemoglobin: 13.2 g/dL (ref 13.0–17.0)
MCH: 31.7 pg (ref 26.0–34.0)
MCHC: 33.2 g/dL (ref 30.0–36.0)
MCV: 95.7 fL (ref 80.0–100.0)
Platelets: 185 10*3/uL (ref 150–400)
RBC: 4.16 MIL/uL — ABNORMAL LOW (ref 4.22–5.81)
RDW: 13.5 % (ref 11.5–15.5)
WBC: 8.4 10*3/uL (ref 4.0–10.5)
nRBC: 0 % (ref 0.0–0.2)

## 2020-06-24 LAB — CEA: CEA: 16.5 ng/mL — ABNORMAL HIGH (ref 0.0–4.7)

## 2020-06-24 LAB — CANCER ANTIGEN 19-9: CA 19-9: 281 U/mL — ABNORMAL HIGH (ref 0–35)

## 2020-06-24 MED ORDER — ACETAMINOPHEN 500 MG PO TABS
1000.0000 mg | ORAL_TABLET | Freq: Three times a day (TID) | ORAL | 0 refills | Status: DC | PRN
Start: 2020-06-24 — End: 2020-09-12

## 2020-06-24 MED ORDER — METHOCARBAMOL 500 MG PO TABS
1000.0000 mg | ORAL_TABLET | Freq: Three times a day (TID) | ORAL | 0 refills | Status: DC | PRN
Start: 2020-06-24 — End: 2020-09-12

## 2020-06-24 MED ORDER — POLYETHYLENE GLYCOL 3350 17 G PO PACK
17.0000 g | PACK | Freq: Every day | ORAL | 0 refills | Status: DC | PRN
Start: 2020-06-24 — End: 2020-09-12

## 2020-06-24 MED ORDER — CEPHALEXIN 500 MG PO CAPS
500.0000 mg | ORAL_CAPSULE | Freq: Two times a day (BID) | ORAL | 0 refills | Status: DC
Start: 2020-06-24 — End: 2020-09-09

## 2020-06-24 MED ORDER — OXYCODONE HCL 5 MG PO TABS: 5.0000 mg | ORAL_TABLET | Freq: Four times a day (QID) | ORAL | 0 refills | Status: DC | PRN

## 2020-06-24 MED ORDER — SODIUM ZIRCONIUM CYCLOSILICATE 5 G PO PACK
5.0000 g | PACK | Freq: Once | ORAL | Status: AC
  Administered 2020-06-24: 5 g via ORAL
  Filled 2020-06-24: qty 1
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
## 2020-06-25 LAB — GLUCOSE, CAPILLARY
Glucose-Capillary: 61 mg/dL — ABNORMAL LOW (ref 70–99)
Glucose-Capillary: 89 mg/dL (ref 70–99)
Glucose-Capillary: 93 mg/dL (ref 70–99)
Glucose-Capillary: 105 mg/dL — ABNORMAL HIGH (ref 70–99)

## 2020-09-09 ENCOUNTER — Other Ambulatory Visit: Payer: Self-pay

## 2020-09-09 ENCOUNTER — Encounter (HOSPITAL_COMMUNITY): Payer: Self-pay | Admitting: *Deleted

## 2020-09-09 ENCOUNTER — Emergency Department (HOSPITAL_COMMUNITY)
Admission: EM | Admit: 2020-09-09 | Discharge: 2020-09-09 | Disposition: A | Discharge status: Home / Self Care | Payer: Medicare Other | Attending: Emergency Medicine | Admitting: Emergency Medicine

## 2020-09-09 DIAGNOSIS — Z20822 Contact with and (suspected) exposure to covid-19: Secondary | ICD-10-CM | POA: Diagnosis not present

## 2020-09-09 DIAGNOSIS — Z794 Long term (current) use of insulin: Secondary | ICD-10-CM | POA: Diagnosis not present

## 2020-09-09 DIAGNOSIS — E119 Type 2 diabetes mellitus without complications: Secondary | ICD-10-CM | POA: Diagnosis not present

## 2020-09-09 DIAGNOSIS — R439 Unspecified disturbances of smell and taste: Secondary | ICD-10-CM | POA: Insufficient documentation

## 2020-09-09 DIAGNOSIS — Z96653 Presence of artificial knee joint, bilateral: Secondary | ICD-10-CM | POA: Insufficient documentation

## 2020-09-09 DIAGNOSIS — J029 Acute pharyngitis, unspecified: Secondary | ICD-10-CM | POA: Diagnosis present

## 2020-09-09 DIAGNOSIS — B349 Viral infection, unspecified: Secondary | ICD-10-CM | POA: Diagnosis not present

## 2020-09-09 LAB — LIPASE, BLOOD: Lipase: 33 U/L (ref 11–51)

## 2020-09-09 LAB — URINALYSIS, ROUTINE W REFLEX MICROSCOPIC
Bilirubin Urine: NEGATIVE
Glucose, UA: NEGATIVE mg/dL
Hgb urine dipstick: NEGATIVE
Ketones, ur: NEGATIVE mg/dL
Leukocytes,Ua: NEGATIVE
Nitrite: NEGATIVE
Protein, ur: NEGATIVE mg/dL
Specific Gravity, Urine: 1.016 (ref 1.005–1.030)
pH: 7 (ref 5.0–8.0)

## 2020-09-09 LAB — COMPREHENSIVE METABOLIC PANEL
ALT: 10 U/L (ref 0–44)
AST: 18 U/L (ref 15–41)
Albumin: 3.8 g/dL (ref 3.5–5.0)
Alkaline Phosphatase: 77 U/L (ref 38–126)
Anion gap: 12 (ref 5–15)
BUN: 14 mg/dL (ref 8–23)
CO2: 24 mmol/L (ref 22–32)
Calcium: 9.7 mg/dL (ref 8.9–10.3)
Chloride: 100 mmol/L (ref 98–111)
Creatinine, Ser: 1.01 mg/dL (ref 0.61–1.24)
GFR, Estimated: >60 mL/min (ref >60)
Glucose, Bld: 116 mg/dL — ABNORMAL HIGH (ref 70–99)
Potassium: 3.9 mmol/L (ref 3.5–5.1)
Sodium: 136 mmol/L (ref 135–145)
Total Bilirubin: 0.4 mg/dL (ref 0.3–1.2)
Total Protein: 7.1 g/dL (ref 6.5–8.1)

## 2020-09-09 LAB — CBC
HCT: 46.6 % (ref 39.0–52.0)
Hemoglobin: 15.9 g/dL (ref 13.0–17.0)
MCH: 31.9 pg (ref 26.0–34.0)
MCHC: 34.1 g/dL (ref 30.0–36.0)
MCV: 93.6 fL (ref 80.0–100.0)
Platelets: 259 10*3/uL (ref 150–400)
RBC: 4.98 MIL/uL (ref 4.22–5.81)
RDW: 12.7 % (ref 11.5–15.5)
WBC: 5.5 10*3/uL (ref 4.0–10.5)
nRBC: 0 % (ref 0.0–0.2)

## 2020-09-09 LAB — SARS CORONAVIRUS 2 (TAT 6-24 HRS): SARS Coronavirus 2: NEGATIVE

## 2020-09-09 MED ORDER — LIDOCAINE VISCOUS HCL 2 % MT SOLN
15.0000 mL | Freq: Once | OROMUCOSAL | Status: AC
  Administered 2020-09-09: 15 mL via OROMUCOSAL
  Filled 2020-09-09: qty 15

## 2020-09-09 MED ORDER — ONDANSETRON 4 MG PO TBDP
4.0000 mg | ORAL_TABLET | Freq: Once | ORAL | Status: AC
  Administered 2020-09-09: 4 mg via ORAL
  Filled 2020-09-09: qty 1
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
## 2020-09-09 NOTE — Discharge Instructions (Addendum)
Your Covid test was negative.  You may return to normal activities around other people when your symptoms are improving.

## 2020-09-09 NOTE — ED Triage Notes (Signed)
Pt stating nausea, vomiting, cough, loss of taste,  headache, sweats, sore throat for over a week.

## 2020-09-09 NOTE — ED Notes (Signed)
Fluids given tolerated well

## 2020-09-11 ENCOUNTER — Ambulatory Visit (HOSPITAL_COMMUNITY)
Admission: EM | Admit: 2020-09-11 | Discharge: 2020-09-13 | Disposition: A | Discharge status: Home / Self Care | Payer: Medicare Other | Attending: Psychiatry | Admitting: Psychiatry

## 2020-09-11 ENCOUNTER — Other Ambulatory Visit: Payer: Self-pay

## 2020-09-11 DIAGNOSIS — F1010 Alcohol abuse, uncomplicated: Secondary | ICD-10-CM | POA: Insufficient documentation

## 2020-09-11 DIAGNOSIS — Z20822 Contact with and (suspected) exposure to covid-19: Secondary | ICD-10-CM | POA: Diagnosis not present

## 2020-09-11 DIAGNOSIS — F1914 Other psychoactive substance abuse with psychoactive substance-induced mood disorder: Secondary | ICD-10-CM | POA: Diagnosis not present

## 2020-09-11 DIAGNOSIS — F1910 Other psychoactive substance abuse, uncomplicated: Secondary | ICD-10-CM

## 2020-09-11 DIAGNOSIS — F1490 Cocaine use, unspecified, uncomplicated: Secondary | ICD-10-CM | POA: Insufficient documentation

## 2020-09-11 DIAGNOSIS — F39 Unspecified mood [affective] disorder: Secondary | ICD-10-CM

## 2020-09-11 DIAGNOSIS — F32A Depression, unspecified: Secondary | ICD-10-CM | POA: Diagnosis present

## 2020-09-11 LAB — POCT URINE DRUG SCREEN - MANUAL ENTRY (I-SCREEN)
POC Amphetamine UR: NOT DETECTED
POC Buprenorphine (BUP): NOT DETECTED
POC Cocaine UR: POSITIVE — AB
POC Marijuana UR: NOT DETECTED
POC Methadone UR: NOT DETECTED
POC Methamphetamine UR: NOT DETECTED
POC Morphine: NOT DETECTED
POC Oxazepam (BZO): NOT DETECTED
POC Oxycodone UR: NOT DETECTED
POC Secobarbital (BAR): NOT DETECTED

## 2020-09-11 LAB — LIPID PANEL
Cholesterol: 198 mg/dL (ref 0–200)
HDL: 76 mg/dL (ref >40)
LDL Cholesterol: 103 mg/dL — ABNORMAL HIGH (ref 0–99)
Total CHOL/HDL Ratio: 2.6 RATIO
Triglycerides: 96 mg/dL (ref <150)
VLDL: 19 mg/dL (ref 0–40)

## 2020-09-11 LAB — RESP PANEL BY RT-PCR (FLU A&B, COVID) ARPGX2
Influenza A by PCR: NEGATIVE
Influenza B by PCR: NEGATIVE
SARS Coronavirus 2 by RT PCR: NEGATIVE

## 2020-09-11 LAB — HEMOGLOBIN A1C
Hgb A1c MFr Bld: 5.6 % (ref 4.8–5.6)
Mean Plasma Glucose: 114.02 mg/dL

## 2020-09-11 LAB — POC SARS CORONAVIRUS 2 AG -  ED: SARS Coronavirus 2 Ag: NEGATIVE

## 2020-09-11 LAB — ETHANOL: Alcohol, Ethyl (B): <10 mg/dL (ref <10)

## 2020-09-11 LAB — TSH: TSH: 5.446 u[IU]/mL — ABNORMAL HIGH (ref 0.350–4.500)

## 2020-09-11 MED ORDER — MIRTAZAPINE 15 MG PO TABS
15.0000 mg | ORAL_TABLET | Freq: Every day | ORAL | Status: DC
  Administered 2020-09-11 – 2020-09-12 (×2): 15 mg via ORAL
  Filled 2020-09-11 (×2): qty 1
  Filled 2020-09-11: qty 14

## 2020-09-11 MED ORDER — MAGNESIUM HYDROXIDE 400 MG/5ML PO SUSP: 30.0000 mL | Freq: Every day | ORAL | Status: DC | PRN

## 2020-09-11 MED ORDER — POLYETHYLENE GLYCOL 3350 17 G PO PACK: 17.0000 g | PACK | Freq: Every day | ORAL | Status: DC | PRN

## 2020-09-11 MED ORDER — GABAPENTIN 100 MG PO CAPS
100.0000 mg | ORAL_CAPSULE | Freq: Two times a day (BID) | ORAL | Status: DC
  Administered 2020-09-11 – 2020-09-13 (×4): 100 mg via ORAL
  Filled 2020-09-11: qty 1
  Filled 2020-09-11: qty 28
  Filled 2020-09-11 (×3): qty 1

## 2020-09-11 MED ORDER — ALUM & MAG HYDROXIDE-SIMETH 200-200-20 MG/5ML PO SUSP: 30.0000 mL | ORAL | Status: DC | PRN

## 2020-09-11 MED ORDER — ACETAMINOPHEN 325 MG PO TABS: 650.0000 mg | ORAL_TABLET | Freq: Four times a day (QID) | ORAL | Status: DC | PRN

## 2020-09-11 MED ORDER — METFORMIN HCL 500 MG PO TABS
500.0000 mg | ORAL_TABLET | Freq: Two times a day (BID) | ORAL | Status: DC
  Administered 2020-09-11 – 2020-09-13 (×4): 500 mg via ORAL
  Filled 2020-09-11: qty 28
  Filled 2020-09-11 (×4): qty 1

## 2020-09-11 MED ORDER — MELATONIN 5 MG PO TABS
5.0000 mg | ORAL_TABLET | Freq: Every evening | ORAL | Status: DC | PRN
  Administered 2020-09-11 – 2020-09-12 (×2): 5 mg via ORAL
  Filled 2020-09-11 (×2): qty 1

## 2020-09-11 NOTE — ED Notes (Signed)
Patient received meal

## 2020-09-11 NOTE — ED Provider Notes (Signed)
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 obtained with different assay methods or kits cannot be used interchangeably.  Results cannot be interpreted as absolute evidence of the presence or absence of malignant disease. Performed At: [facility] [place], [place] [id number] Rush Farmer MD [id number]   . CA 19-9 06/23/2020  281* 0 - 35 U/mL Final   Comment: (NOTE) **Verified by repeat analysis** Roche Diagnostics Electrochemiluminescence Immunoassay (ECLIA) Values obtained with different assay methods or kits cannot be used interchangeably.  Results cannot be interpreted as absolute evidence of the presence or absence of malignant disease. Performed At: [facility] [place], [place] [id number] Rush Farmer MD [id number]   . AFP, Serum, Tumor  Marker 06/23/2020 2.9  0.0 - 8.3 ng/mL Final   Comment: (NOTE) Roche Diagnostics Electrochemiluminescence Immunoassay (ECLIA) Values obtained with different assay methods or kits cannot be used interchangeably.  Results cannot be interpreted as absolute evidence of the presence or absence of malignant disease. This test is not interpretable in pregnant females. Performed At: [facility] [place], [place] [id number] Rush Farmer MD [id number]   . Hgb A1c MFr Bld 06/23/2020 5.4  4.8 - 5.6 % Final   Comment: (NOTE) Pre diabetes:          5.7%-6.4%  Diabetes:              >6.4%  Glycemic control for   <7.0% adults with diabetes   . Mean Plasma Glucose 06/23/2020 108.28  mg/dL Final   Performed at [facility] [address] [place], [place] [address]  . Glucose-Capillary 06/23/2020 122* 70 - 99 mg/dL Final   Glucose reference range applies only to samples taken after fasting for at least 8 hours.  Marland Kitchen Specimen Description 06/23/2020 URINE, RANDOM   Final  . Special Requests 06/23/2020 None. Please collect for abx   Final  . Culture 06/23/2020 *  Final                   Value:<10,000 COLONIES/mL INSIGNIFICANT GROWTH Performed at [facility], [place] [address] [place], [place] [address]   . Report Status 06/23/2020 06/24/2020 FINAL   Final  . WBC 06/24/2020 8.4  4.0 - 10.5 K/uL Final  . RBC 06/24/2020 4.16* 4.22 - 5.81 MIL/uL Final  . Hemoglobin 06/24/2020 13.2  13.0 - 17.0 g/dL Final  . HCT  06/24/2020 39.8  39.0 - 52.0 % Final  . MCV 06/24/2020 95.7  80.0 - 100.0 fL Final  . MCH 06/24/2020 31.7  26.0 - 34.0 pg Final  . MCHC 06/24/2020 33.2  30.0 - 36.0 g/dL Final  . RDW 06/24/2020 13.5  11.5 - 15.5 % Final  . Platelets 06/24/2020 185  150 - 400 K/uL Final  . nRBC 06/24/2020 0.0  0.0 - 0.2 % Final   Performed at [facility] [address] [place], [place] [address]  . Sodium 06/24/2020 140  135 - 145 mmol/L Final  . Potassium 06/24/2020 5.2* 3.5 - 5.1 mmol/L Final  . Chloride 06/24/2020 103  98 - 111 mmol/L Final  . CO2 06/24/2020 24  22 - 32 mmol/L Final  . Glucose, Bld 06/24/2020 70  70 - 99 mg/dL Final   Glucose reference range applies only to samples taken after fasting for at least 8 hours.  . BUN 06/24/2020 15  8 - 23 mg/dL Final  . Creatinine, Ser 06/24/2020 1.12  0.61 - 1.24 mg/dL Final  . Calcium 06/24/2020 10.0  8.9 - 10.3 mg/dL Final  . GFR, Estimated 06/24/2020 >60  >60 mL/min Final   Comment: (NOTE) Calculated using the CKD-EPI Creatinine Equation (2021)   . Anion gap 06/24/2020 13  5 - 15 Final   Performed at [facility] [address] [place], [place] [address]  . Glucose-Capillary 06/23/2020 89  70 - 99 mg/dL Final   Glucose reference range applies only to samples taken after fasting for at least 8 hours.  . Glucose-Capillary 06/23/2020 108* 70 - 99 mg/dL Final   Glucose reference range applies only to samples taken after fasting for at least 8 hours.  . Comment 1 06/23/2020 Document in Chart   Final  .  Glucose-Capillary 06/23/2020 61* 70 - 99 mg/dL Final   Glucose reference range applies only to samples taken after fasting for at least 8 hours.  . Comment 1 06/23/2020 Document in Chart   Final  . Glucose-Capillary 06/24/2020 89  70 - 99 mg/dL Final   Glucose reference range applies only to samples taken after fasting for at least 8 hours.  . Comment 1 06/24/2020 Document in Chart   Final  . Glucose-Capillary 06/24/2020 93  70 - 99  mg/dL Final   Glucose reference range applies only to samples taken after fasting for at least 8 hours.  . Glucose-Capillary 06/24/2020 105* 70 - 99 mg/dL Final   Glucose reference range applies only to samples taken after fasting for at least 8 hours.    Allergies: Patient has no known allergies.  PTA Medications: (Not in a hospital admission)   Medical Decision Making  Pt presents with SI with plan to overdose on heroin; unable to contract for safety. Will admit for observation and stabilization and initiate medication. Will re-assess tomorrow to determine appropriate level of care. Would likely benefit from rehab is appropriate and patient amenable.  Routine Labs ordered- TSH, a1c, lipid panel, ethanol, UDS, EKG. -has CBC and CMP from 2 days ago-unremarkable, will not re-draw at this time.  -restarted home medications of gabapentin, metformin and glycolax. -start remeron 15 mg qhs for mood/anxiety; may help with appetite and sleep -he reports home medication of opioids; per PMP, has not received since 06/2020. Will not restart at this time.        Recommendations  Based on my evaluation the patient does not appear to have an emergency medical condition.  Ival Bible, MD 09/11/20  4:27 PM

## 2020-09-11 NOTE — ED Notes (Signed)
LOCKER 31

## 2020-09-11 NOTE — ED Notes (Signed)
Pt sleeping at present, no distress noted, monitoring for safety. 

## 2020-09-11 NOTE — ED Notes (Signed)
Pt sleeping at present, no distress noted, monitoring for safety. Remains SI.
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
## 2020-09-11 NOTE — Progress Notes (Signed)
Patient is alert and oriented X 3, with active suicidal thoughts to buy heroin and overdose. Patient denies HI/ AVH. Patient has a small abrasion in center chest. Patient has a sad affect, but cooperative. Patient oriented to the unit offered foods and toileting. Nursing staff will continue to monitor.

## 2020-09-12 DIAGNOSIS — F1914 Other psychoactive substance abuse with psychoactive substance-induced mood disorder: Secondary | ICD-10-CM | POA: Diagnosis not present

## 2020-09-12 MED ORDER — METFORMIN HCL 500 MG PO TABS: 500.0000 mg | ORAL_TABLET | Freq: Two times a day (BID) | ORAL | Status: AC

## 2020-09-12 MED ORDER — MIRTAZAPINE 15 MG PO TABS: 15.0000 mg | ORAL_TABLET | Freq: Every day | ORAL | Status: AC

## 2020-09-12 MED ORDER — GABAPENTIN 100 MG PO CAPS: 100.0000 mg | ORAL_CAPSULE | Freq: Two times a day (BID) | ORAL | Status: AC

## 2020-09-12 NOTE — ED Provider Notes (Signed)
Behavioral Health Progress Note  Date and Time: 09/12/2020 3:58 PM Name: [name] MRN:  [id number]  Subjective:   Patient seen this AM. He is calm, cooperative and pleasant. He states that he is feeling "a little better". He denies SI/HI/AVH. Pt reports desire to go to rehab. SW was consulted to assist with rehab placement. No other concerns or complaints.  Per SW patient has been accepted by [facility] treatment center and will need to arrive at the airport tomorrow. Per SW flight is at 4 pm but patient will need to arrive early in order to verify identity  Diagnosis:  Final diagnoses:  Unspecified mood (affective) disorder ([place])  Polysubstance abuse ([place])    Total Time spent with patient: 15 minutes  Past Psychiatric History: aww H&P Past Medical History:  Past Medical History:  Diagnosis Date  . Alcohol abuse    until age 33  . Arthritis   . Bell's palsy 05/2017  . BPH (benign prostatic hyperplasia)   . Cervical spondylosis with myelopathy   . 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 Chronic hepatitis C without hepatic coma ([place]) FOLLOWED BY INFECTOUS DISEASE DR COMER   POSITIVE ANTIBODY TEST THIS YEAR 2016--  CURRENT TX ON HARVONI PO- completeed 2016  . Chronic low back pain   . Collapsed lung    right .Marland Kitchen..15 yrs ago, fell and hit some bricks  . DDD (degenerative disc disease), lumbosacral   . Depression    no meds now- med used for bladder  . Diabetes mellitus without complication (HCC)    Type II, does not check cbg at home   . ED (erectile dysfunction)   . Foley catheter in place    removed, post lumbar surgery- cath left in throughh rehab- had 2 UTI's  . Full dentures   . GERD (gastroesophageal reflux disease)    hx none in long time  . H/O neck surgery    back of neck spine anterior entry 12-26-18 , 03-01-2019 reports s/p neck swelling of surgical site  that impair swallowing , patiwnr states "i will swallow and  food with come beack up and come up into my nose "   . History of cocaine abuse ([place])    per pt quit and last used 06-03-2013  . History of radiation therapy 07/11/18- 09/01/18   Oropharynx/ treated to 60 Gy in 30 fractions of 2 Gy  . Lymphoma ([place])   . Urinary retention   . Wears glasses     Past Surgical History:  Procedure Laterality Date  . ACDF    . ANTERIOR CERVICAL DECOMP/DISCECTOMY FUSION N/A 12/28/2018   Procedure: ANTERIOR CERVICAL DECOMPRESSION/DISCECTOMY Trinidad Curet PROSTHESIS,PLATE/SCREWS CERVICAL THREE- CERVICAL FOUR EXPLORE FUSION WITH REMOVAL OLD HARDWARE;  Surgeon: Newman Pies, MD;  Location: [facility];  Service: Neurosurgery;  Laterality: N/A;  . APPENDECTOMY  1980's  . BACK SURGERY     lower back x2  . [place]  . CLOSED REDUCTION AND INTRAMAXILLARY FIXATION BILATERAL MENTAL FX'S/  PLACEMENT MAXILLARY STENT/ APERTURE WIRE PLACEMENT  01-04-2000  . DIRECT LARYNGOSCOPY N/A 04/21/2018   Procedure: DIRECT LARYNGOSCOPY WITH BIOPSY OF TONGUE BASE AND NASOPHARYNX;  Surgeon: Jerrell Belfast, MD;  Location: [facility];  Service: ENT;  Laterality: N/A;  Direct laryngoscopy with biopsy of tongue base and nasopharynx  . ESOPHAGOGASTRODUODENOSCOPY (EGD) WITH PROPOFOL N/A 06/22/2020   Procedure: ESOPHAGOGASTRODUODENOSCOPY (EGD) WITH PROPOFOL;  Surgeon: Clarene Essex, MD;  [facility];  Service: Endoscopy;  Laterality: N/A;  . FOREIGN BODY REMOVAL N/A 06/22/2020   Procedure: FOREIGN BODY REMOVAL;  Surgeon: Clarene Essex, MD;  Location: [facility];  Service: Endoscopy;  Laterality: N/A;  . JOINT REPLACEMENT    . MULTIPLE TOOTH EXTRACTIONS    . POSTERIOR LUMBAR FUSION  03-05-2015   laminectomy /  nerve decompression--  L4 -- S1  . RADICAL NECK DISSECTION Right 05/31/2018   Procedure: RADICAL MODIFIED RADICAL NECK DISSECTION;  Surgeon: Jerrell Belfast, MD;  Location: [facility];  Service: ENT;  Laterality: Right;  . RADIOLOGY WITH ANESTHESIA N/A 08/08/2018    Procedure: MRI CERVICAL SPINE WITHOUT CONTRAST;  Surgeon: Radiologist, Medication, MD;  Location: [facility];  Service: Radiology;  Laterality: N/A;  . TONSILLECTOMY  04/21/2018   Procedure: RIGHT TONSILLECTOMY;  Surgeon: Jerrell Belfast, MD;  Location: [facility];  Service: ENT;;  . TONSILLECTOMY    . TOTAL KNEE ARTHROPLASTY Left 09/21/2013   Procedure: LEFT TOTAL KNEE ARTHROPLASTY;  Surgeon: Yvette Rack., MD;  Location: [facility];  Service: Orthopedics;  Laterality: Left;  . TOTAL KNEE ARTHROPLASTY Right 03/02/2019   Procedure: RIGHT TOTAL KNEE ARTHROPLASTY;  Surgeon: Earlie Server, MD;  Location: [facility];  Service: Orthopedics;  Laterality: Right;  . TRANSURETHRAL RESECTION OF PROSTATE N/A 06/16/2015   Procedure: TRANSURETHRAL RESECTION OF THE PROSTATE WITH GYRUS INSTRUMENTS;  Surgeon: Franchot Gallo, MD;  Location[facility];  Service: Urology;  Laterality: N/A;   Family History:  Family History  Problem Relation Age of Onset  . Liver cancer Paternal Grandmother   . Bone cancer Paternal Grandfather   . Stomach cancer Paternal Uncle   . Colon cancer Neg Hx   . Esophageal cancer Neg Hx   . Pancreatic cancer Neg Hx   . Inflammatory bowel disease Neg Hx   . Rectal cancer Neg Hx    Family Psychiatric  History: see  H&P Social History:  Social History   Substance and Sexual Activity  Alcohol Use Yes   Comment: occasional  beer- 12 pack beer monthly      Social History   Substance and Sexual Activity  Drug Use Not Currently  . Types: "Crack" cocaine   Comment: per pt quit and last used crack 04/16/17    Social History   Socioeconomic History  . Marital status: Divorced    Spouse name: Not on file  . Number of children: Not on file  . Years of education: Not on file  . Highest education level: Not on file  Occupational History  . Occupation: Cook  Tobacco Use  . Smoking status: Never Smoker  . Smokeless tobacco: Never Used  Vaping Use  . Vaping Use: Never used   Substance and Sexual Activity  . Alcohol use: Yes    Comment: occasional  beer- 12 pack beer monthly   . Drug use: Not Currently    Types: "Crack" cocaine    Comment: per pt quit and last used crack 04/16/17  . Sexual activity: Not Currently    Comment: declined condoms  Other Topics Concern  . Not on file  Social History Narrative  . Not on file   Social Determinants of Health   Financial Resource Strain: Not on file  Food Insecurity: Not on file  Transportation Needs: Not on file  Physical Activity: Not on file  Stress: Not on file  Social Connections: Not on file   SDOH:  SDOH Screenings   Alcohol Screen: Not on file  Depression (PHQ2-9): Medium Risk  . PHQ-2 Score: 22  Financial Resource Strain: Not on file  Food Insecurity: Not on file  Housing: Not on file  Physical Activity: Not on file  Social Connections: Not on file  Stress: Not on file  Tobacco Use: Low Risk   . Smoking Tobacco Use: Never Smoker  . Smokeless Tobacco Use: Never Used  Transportation Needs: Not on file   Additional Social History:    Pain Medications: See MAR Prescriptions: See MAR Over the Counter: See MAR History of alcohol / drug use?: Yes Negative Consequences of Use: Legal,Financial,Personal relationships Withdrawal Symptoms:  (none) Name of Substance 1: Crack cocaine 1 - Age of First Use: 34 1 - Amount (size/oz): unknown 1 - Frequency: every two days 1 - Duration: ongoing 1 - Last Use / Amount: Tuesday/$40-50 1- Route of Use: smoking Name of Substance 2: Alcohol 2 - Age of First Use: 19 2 - Amount (size/oz): shot of gin and a few beers 2 - Frequency: 1-2 times a week 2 - Duration: ongoing 2 - Last Use / Amount: unknown 2 - Route of Substance Use: oral                Sleep: Fair  Appetite:  Fair  Current Medications:  Current Facility-Administered Medications  Medication Dose Route Frequency Provider Last Rate Last Admin  . acetaminophen (TYLENOL) tablet 650 mg   650 mg Oral Q6H PRN Ival Bible, MD      . alum & mag hydroxide-simeth (MAALOX/MYLANTA) 200-200-20 MG/5ML suspension 30 mL  30 mL Oral Q4H PRN Ival Bible, MD      . gabapentin (NEURONTIN) capsule 100 mg  100 mg Oral BID Ival Bible, MD   100 mg at 09/12/20 0850  . magnesium hydroxide (MILK OF MAGNESIA) suspension 30 mL  30 mL Oral Daily PRN Ival Bible, MD      . melatonin tablet 5 mg  5 mg Oral QHS PRN Ival Bible, MD   5 mg at 09/11/20 2123  . metFORMIN (GLUCOPHAGE) tablet 500 mg  500 mg Oral BID WC Ival Bible, MD   500 mg at 09/12/20 0850  . mirtazapine (REMERON) tablet 15 mg  15 mg Oral QHS Ival Bible, MD   15 mg at 09/11/20 2123  . polyethylene glycol (MIRALAX / GLYCOLAX) packet 17 g  17 g Oral Daily PRN Ival Bible, MD       Current Outpatient Medications  Medication Sig Dispense Refill  . acetaminophen (TYLENOL) 500 MG tablet Take 2 tablets (1,000 mg total) by mouth every 8 (eight) hours as needed. 30 tablet 0  . gabapentin (NEURONTIN) 100 MG capsule Take 100 mg by mouth 2 (two) times daily.    . metFORMIN (GLUCOPHAGE) 500 MG tablet Take 500 mg by mouth 2 (two) times daily.    . methocarbamol (ROBAXIN) 500 MG tablet Take 2 tablets (1,000 mg total) by mouth every 8 (eight) hours as needed for muscle spasms. 45 tablet 0  . naproxen sodium (ALEVE) 220 MG tablet Take 220 mg by mouth 3 (three) times daily as needed (pain/headache).    . oxyCODONE (OXY IR/ROXICODONE) 5 MG immediate release tablet Take 1 tablet (5 mg total) by mouth every 6 (six) hours as needed for breakthrough pain. (Patient not taking: Reported on 09/09/2020) 15 tablet 0  . polyethylene glycol (MIRALAX / GLYCOLAX) 17 g packet Take 17 g by mouth daily as needed. (Patient not taking: No sig  reported) 14 each 0   Facility-Administered Medications Ordered in Other Encounters  Medication Dose Route Frequency Provider Last Rate Last Admin  . 0.9 %  sodium  chloride infusion   Intravenous Once Harle Stanford., PA-C      . ondansetron [facility]) injection 4 mg  4 mg Intravenous Once Harle Stanford., PA-C        Labs  Lab Results:  Admission on 09/11/2020  Component Date Value Ref Range Status  . SARS Coronavirus 2 by RT PCR 09/11/2020 NEGATIVE  NEGATIVE Final   Comment: (NOTE) SARS-CoV-2 target nucleic acids are NOT DETECTED.  The SARS-CoV-2 RNA is generally detectable in upper respiratory specimens during the acute phase of infection. The lowest concentration of SARS-CoV-2 viral copies this assay can detect is 138 copies/mL. A negative result does not preclude SARS-Cov-2 infection and should not be used as the sole basis for treatment or other patient management decisions. A negative result may occur with  improper specimen collection/handling, submission of specimen other than nasopharyngeal swab, presence of viral mutation(s) within the areas targeted by this assay, and inadequate number of viral copies(<138 copies/mL). A negative result must be combined with clinical observations, patient history, and epidemiological information. The expected result is Negative.  Fact Sheet for Patients:  EntrepreneurPulse.com.au  Fact Sheet for Healthcare Providers:  IncredibleEmployment.be  This test is no                          t yet approved or cleared by the [place] FDA and  has been authorized for detection and/or diagnosis of SARS-CoV-2 by FDA under an Emergency Use Authorization (EUA). This EUA will remain  in effect (meaning this test can be used) for the duration of the COVID-19 declaration under Section 564(b)(1) of the Act, 21 U.S.C.section 360bbb-3(b)(1), unless the authorization is terminated  or revoked sooner.      . Influenza A by PCR 09/11/2020 NEGATIVE  NEGATIVE Final  . Influenza B by PCR 09/11/2020 NEGATIVE  NEGATIVE Final   Comment: (NOTE) The Xpert Xpress SARS-CoV-2/FLU/RSV  plus assay is intended as an aid in the diagnosis of influenza from Nasopharyngeal swab specimens and should not be used as a sole basis for treatment. Nasal washings and aspirates are unacceptable for Xpert Xpress SARS-CoV-2/FLU/RSV testing.  Fact Sheet for Patients: EntrepreneurPulse.com.au  Fact Sheet for Healthcare Providers: IncredibleEmployment.be  This test is not yet approved or cleared by the [place] FDA and has been authorized for detection and/or diagnosis of SARS-CoV-2 by FDA under an Emergency Use Authorization (EUA). This EUA will remain in effect (meaning this test can be used) for the duration of the COVID-19 declaration under Section 564(b)(1) of the Act, 21 U.S.C. section 360bbb-3(b)(1), unless the authorization is terminated or revoked.  Performed at [facility], [place] [address] [place], [place] [address]   . Hgb A1c MFr Bld 09/11/2020 5.6  4.8 - 5.6 % Final   Comment: (NOTE) Pre diabetes:          5.7%-6.4%  Diabetes:              >6.4%  Glycemic control for   <7.0% adults with diabetes   . Mean Plasma Glucose 09/11/2020 114.02  mg/dL Final   Performed at [facility] [address], [place], [place] [address]  . Alcohol, Ethyl (B) 09/11/2020 <10  <10 mg/dL Final   Comment: (NOTE) Lowest detectable limit for serum alcohol is 10  mg/dL.  For medical purposes only. Performed at [facility], [place] [address], [place], [place] [address]   . Cholesterol 09/11/2020 198  0 - 200 mg/dL Final  . Triglycerides 09/11/2020 96  <150 mg/dL Final  . HDL 09/11/2020 76  >40 mg/dL Final  . Total CHOL/HDL Ratio 09/11/2020 2.6  RATIO Final  . VLDL 09/11/2020 19  0 - 40 mg/dL Final  . LDL Cholesterol 09/11/2020 103* 0 - 99 mg/dL Final   Comment:        Total Cholesterol/HDL:CHD Risk Coronary Heart Disease Risk Table                     Men   Women  1/2 Average Risk   3.4   3.3  Average Risk       5.0    4.4  2 X Average Risk   9.6   7.1  3 X Average Risk  23.4   11.0        Use the calculated Patient Ratio above and the CHD Risk Table to determine the patient's CHD Risk.        ATP III CLASSIFICATION (LDL):  <100     mg/dL   Optimal  100-129  mg/dL   Near or Above                    Optimal  130-159  mg/dL   Borderline  160-189  mg/dL   High  >190     mg/dL   Very High Performed at [facility] [address], [place], [place] [address]   . TSH 09/11/2020 5.446* 0.350 - 4.500 uIU/mL Final   Comment: Performed by a 3rd Generation assay with a functional sensitivity of <=0.01 uIU/mL. Performed at [facility], [place] [address] [place], [place] [address]   . POC Amphetamine UR 09/11/2020 None Detected  NONE DETECTED (Cut Off Level 1000 ng/mL) Final  . POC Secobarbital (BAR) 09/11/2020 None Detected  NONE DETECTED (Cut Off Level 300 ng/mL) Final  . POC Buprenorphine (BUP) 09/11/2020 None Detected  NONE DETECTED (Cut Off Level 10 ng/mL) Final  . POC Oxazepam (BZO) 09/11/2020 None Detected  NONE DETECTED (Cut Off Level 300 ng/mL) Final  . POC Cocaine UR 09/11/2020 Positive* NONE DETECTED (Cut Off Level 300 ng/mL) Final  . POC Methamphetamine UR 09/11/2020 None Detected  NONE DETECTED (Cut Off Level 1000 ng/mL) Final  . POC Morphine 09/11/2020 None Detected  NONE DETECTED (Cut Off Level 300 ng/mL) Final  . POC Oxycodone UR 09/11/2020 None Detected  NONE DETECTED (Cut Off Level 100 ng/mL) Final  . POC Methadone UR 09/11/2020 None Detected  NONE DETECTED (Cut Off Level 300 ng/mL) Final  . POC Marijuana UR 09/11/2020 None Detected  NONE DETECTED (Cut Off Level 50 ng/mL) Final  . SARS Coronavirus 2 Ag 09/11/2020 Negative  Negative Final  Admission on 09/09/2020, Discharged on 09/09/2020  Component Date Value Ref Range Status  . SARS Coronavirus 2 09/09/2020 NEGATIVE  NEGATIVE Final   Comment: (NOTE) SARS-CoV-2 target nucleic acids are NOT DETECTED.  The SARS-CoV-2 RNA is  generally detectable in upper and lower respiratory specimens during the acute phase of infection. Negative results do not preclude SARS-CoV-2 infection, do not rule out co-infections with other pathogens, and should not be used as the sole basis for treatment or other patient management decisions. Negative results must be combined with clinical observations, patient history, and epidemiological information. The expected  result is Negative.  Fact Sheet for Patients: SugarRoll.be  Fact Sheet for Healthcare Providers: https://www.woods-mathews.com/  This test is not yet approved or cleared by the [place] FDA and  has been authorized for detection and/or diagnosis of SARS-CoV-2 by FDA under an Emergency Use Authorization (EUA). This EUA will remain  in effect (meaning this test can be used) for the duration of the COVID-19 declaration under Se                          ction 564(b)(1) of the Act, 21 U.S.C. section 360bbb-3(b)(1), unless the authorization is terminated or revoked sooner.  Performed at [facility], [place] [address], [place], [place] [address]   . Lipase 09/09/2020 33  11 - 51 U/L Final   Performed at [facility], [place] [address], [place], [place] [address]  . Sodium 09/09/2020 136  135 - 145 mmol/L Final  . Potassium 09/09/2020 3.9  3.5 - 5.1 mmol/L Final  . Chloride 09/09/2020 100  98 - 111 mmol/L Final  . CO2 09/09/2020 24  22 - 32 mmol/L Final  . Glucose, Bld 09/09/2020 116* 70 - 99 mg/dL Final   Glucose reference range applies only to samples taken after fasting for at least 8 hours.  . BUN 09/09/2020 14  8 - 23 mg/dL Final  . Creatinine, Ser 09/09/2020 1.01  0.61 - 1.24 mg/dL Final  . Calcium 09/09/2020 9.7  8.9 - 10.3 mg/dL Final  . Total Protein 09/09/2020 7.1  6.5 - 8.1 g/dL Final  . Albumin 09/09/2020 3.8  3.5 - 5.0 g/dL Final  . AST 09/09/2020 18  15 - 41 U/L Final  . ALT 09/09/2020 10  0 - 44 U/L  Final  . Alkaline Phosphatase 09/09/2020 77  38 - 126 U/L Final  . Total Bilirubin 09/09/2020 0.4  0.3 - 1.2 mg/dL Final  . GFR, Estimated 09/09/2020 >60  >60 mL/min Final   Comment: (NOTE) Calculated using the CKD-EPI Creatinine Equation (2021)   . Anion gap 09/09/2020 12  5 - 15 Final   Performed at [facility] [address], [place], [place] [address]  . WBC 09/09/2020 5.5  4.0 - 10.5 K/uL Final  . RBC 09/09/2020 4.98  4.22 - 5.81 MIL/uL Final  . Hemoglobin 09/09/2020 15.9  13.0 - 17.0 g/dL Final  . HCT 09/09/2020 46.6  39.0 - 52.0 % Final  . MCV 09/09/2020 93.6  80.0 - 100.0 fL Final  . MCH 09/09/2020 31.9  26.0 - 34.0 pg Final  . MCHC 09/09/2020 34.1  30.0 - 36.0 g/dL Final  . RDW 09/09/2020 12.7  11.5 - 15.5 % Final  . Platelets 09/09/2020 259  150 - 400 K/uL Final  . nRBC 09/09/2020 0.0  0.0 - 0.2 % Final   Performed at [facility], [place] [address] [place],  [address]  . Color, Urine 09/09/2020 YELLOW  YELLOW Final  . APPearance 09/09/2020 CLOUDY* CLEAR Final  . Specific Gravity, Urine 09/09/2020 1.016  1.005 - 1.030 Final  . pH 09/09/2020 7.0  5.0 - 8.0 Final  . Glucose, UA 09/09/2020 NEGATIVE  NEGATIVE mg/dL Final  . Hgb urine dipstick 09/09/2020 NEGATIVE  NEGATIVE Final  . Bilirubin Urine 09/09/2020 NEGATIVE  NEGATIVE Final  . Ketones, ur 09/09/2020 NEGATIVE  NEGATIVE mg/dL Final  . Protein, ur 09/09/2020 NEGATIVE  NEGATIVE mg/dL Final  . Nitrite 09/09/2020 NEGATIVE  NEGATIVE Final  .  Leukocytes,Ua 09/09/2020 NEGATIVE  NEGATIVE Final   Performed at [facility], [place] [address], [place], [place] [address]  Admission on 06/22/2020, Discharged on 06/24/2020  Component Date Value Ref Range Status  . Sodium 06/22/2020 136  135 - 145 mmol/L Final  . Potassium 06/22/2020 4.4  3.5 - 5.1 mmol/L Final   SLIGHT HEMOLYSIS  . Chloride 06/22/2020 99  98 - 111 mmol/L Final  . CO2 06/22/2020 25  22 - 32 mmol/L Final  . Glucose, Bld 06/22/2020 123* 70 - 99  mg/dL Final   Glucose reference range applies only to samples taken after fasting for at least 8 hours.  . BUN 06/22/2020 12  8 - 23 mg/dL Final  . Creatinine, Ser 06/22/2020 1.22  0.61 - 1.24 mg/dL Final  . Calcium 06/22/2020 9.6  8.9 - 10.3 mg/dL Final  . Total Protein 06/22/2020 6.4* 6.5 - 8.1 g/dL Final  . Albumin 06/22/2020 3.9  3.5 - 5.0 g/dL Final  . AST 06/22/2020 308* 15 - 41 U/L Final  . ALT 06/22/2020 128* 0 - 44 U/L Final  . Alkaline Phosphatase 06/22/2020 58  38 - 126 U/L Final  . Total Bilirubin 06/22/2020 0.6  0.3 - 1.2 mg/dL Final  . GFR, Estimated 06/22/2020 >60  >60 mL/min Final   Comment: (NOTE) Calculated using the CKD-EPI Creatinine Equation (2021)   . Anion gap 06/22/2020 12  5 - 15 Final   Performed at [facility] [address], [place], [place] [address]  . Sodium 06/22/2020 136  135 - 145 mmol/L Final  . Potassium 06/22/2020 5.6* 3.5 - 5.1 mmol/L Final  . Chloride 06/22/2020 104  98 - 111 mmol/L Final  . BUN 06/22/2020 17  8 - 23 mg/dL Final  . Creatinine, Ser 06/22/2020 1.10  0.61 - 1.24 mg/dL Final  . Glucose, Bld 06/22/2020 116* 70 - 99 mg/dL Final   Glucose reference range applies only to samples taken after fasting for at least 8 hours.  . Calcium, Ion 06/22/2020 1.02* 1.15 - 1.40 mmol/L Final  . TCO2 06/22/2020 24  22 - 32 mmol/L Final  . Hemoglobin 06/22/2020 14.3  13.0 - 17.0 g/dL Final  . HCT 06/22/2020 42.0  39.0 - 52.0 % Final  . WBC 06/22/2020 6.2  4.0 - 10.5 K/uL Final  . RBC 06/22/2020 4.56  4.22 - 5.81 MIL/uL Final  . Hemoglobin 06/22/2020 14.3  13.0 - 17.0 g/dL Final  . HCT 06/22/2020 43.7  39.0 - 52.0 % Final  . MCV 06/22/2020 95.8  80.0 - 100.0 fL Final  . MCH 06/22/2020 31.4  26.0 - 34.0 pg Final  . MCHC 06/22/2020 32.7  30.0 - 36.0 g/dL Final  . RDW 06/22/2020 12.9  11.5 - 15.5 % Final  . Platelets 06/22/2020 215  150 - 400 K/uL Final  . nRBC 06/22/2020 0.0  0.0 - 0.2 % Final   Performed at [facility], [place]  [address] [place], [place] [address]  . Alcohol, Ethyl (B) 06/22/2020 <10  <10 mg/dL Final   Comment: (NOTE) Lowest detectable limit for serum alcohol is 10 mg/dL.  For medical purposes only. Performed at [facility], [place] [address], [place],  [address]   . Color, Urine 06/22/2020 YELLOW  YELLOW Final  . APPearance 06/22/2020 HAZY* CLEAR Final  . Specific Gravity, Urine 06/22/2020 1.017  1.005 - 1.030 Final  . pH 06/22/2020 7.0  5.0 - 8.0 Final  . Glucose, UA 06/22/2020 NEGATIVE  NEGATIVE mg/dL Final  . Hgb urine dipstick 06/22/2020 MODERATE* NEGATIVE Final  . Bilirubin Urine 06/22/2020 NEGATIVE  NEGATIVE Final  . Ketones, ur 06/22/2020 NEGATIVE  NEGATIVE mg/dL Final  . Protein, ur 06/22/2020 100* NEGATIVE mg/dL Final  . Nitrite 06/22/2020 POSITIVE* NEGATIVE Final  . Leukocytes,Ua 06/22/2020 LARGE* NEGATIVE Final  . RBC / HPF 06/22/2020 21-50  0 - 5 RBC/hpf Final  . WBC, UA 06/22/2020 >50* 0 - 5 WBC/hpf Final  . Bacteria, UA 06/22/2020 RARE* NONE SEEN Final   Performed at [facility], [place] [address], [place], [place] [address]  . Lactic Acid, Venous 06/22/2020 1.8  0.5 - 1.9 mmol/L Final   Performed at [facility], [place] [address] [place], [place] [address]  . Prothrombin Time 06/22/2020 13.2  11.4 - 15.2 seconds Final  . INR 06/22/2020 1.0  0.8 - 1.2 Final   Comment: (NOTE) INR goal varies based on device and disease states. Performed at [facility], [place] [address] [place], [place] [address]   . Blood Bank Specimen 06/22/2020 SAMPLE AVAILABLE FOR TESTING   Final  . Sample Expiration 06/22/2020    Final                   Value:06/23/2020,2359 Performed at [facility], [place] [address] [place], [place] [address]   . SARS Coronavirus 2 by RT PCR 06/22/2020 NEGATIVE  NEGATIVE Final   Comment: (NOTE) SARS-CoV-2 target nucleic acids are NOT DETECTED.  The SARS-CoV-2 RNA is generally detectable in upper respiratoy specimens during the acute  phase of infection. The lowest concentration of SARS-CoV-2 viral copies this assay can detect is 131 copies/mL. A negative result does not preclude SARS-Cov-2 infection and should not be used as the sole basis for treatment or other patient management decisions. A negative result may occur with  improper specimen collection/handling, submission of specimen other than nasopharyngeal swab, presence of viral mutation(s) within the areas targeted by this assay, and inadequate number of viral copies (<131 copies/mL). A negative result must be combined with clinical observations, patient history, and epidemiological information. The expected result is Negative.  Fact Sheet for Patients:  PinkCheek.be  Fact Sheet for Healthcare Providers:  GravelBags.it  This test is no                          t yet approved or cleared by the [place] FDA and  has been authorized for detection and/or diagnosis of SARS-CoV-2 by FDA under an Emergency Use Authorization (EUA). This EUA will remain  in effect (meaning this test can be used) for the duration of the COVID-19 declaration under Section 564(b)(1) of the Act, 21 U.S.C. section 360bbb-3(b)(1), unless the authorization is terminated or revoked sooner.    . Influenza A by PCR 06/22/2020 NEGATIVE  NEGATIVE Final  . Influenza B by PCR 06/22/2020 NEGATIVE  NEGATIVE Final   Comment: (NOTE) The Xpert Xpress SARS-CoV-2/FLU/RSV assay is intended as an aid in  the diagnosis of influenza from Nasopharyngeal swab specimens and  should not be used as a sole basis for treatment. Nasal washings and  aspirates are unacceptable for Xpert Xpress SARS-CoV-2/FLU/RSV  testing.  Fact Sheet for Patients: PinkCheek.be  Fact Sheet for Healthcare Providers: GravelBags.it  This test is not yet approved or cleared by the [place] FDA and  has  been authorized for detection and/or diagnosis of SARS-CoV-2 by  FDA under an Emergency Use  Authorization (EUA). This EUA will remain  in effect (meaning this test can be used) for the duration of the  Covid-19 declaration under Section 564(b)(1) of the Act, 21  U.S.C. section 360bbb-3(b)(1), unless the authorization is  terminated or revoked. Performed at [facility], [place] [address], [place], [place] [address]   . Opiates 06/22/2020 NONE DETECTED  NONE DETECTED Final  . Cocaine 06/22/2020 POSITIVE* NONE DETECTED Final  . Benzodiazepines 06/22/2020 NONE DETECTED  NONE DETECTED Final  . Amphetamines 06/22/2020 NONE DETECTED  NONE DETECTED Final  . Tetrahydrocannabinol 06/22/2020 NONE DETECTED  NONE DETECTED Final  . Barbiturates 06/22/2020 NONE DETECTED  NONE DETECTED Final   Comment: (NOTE) DRUG SCREEN FOR MEDICAL PURPOSES ONLY.  IF CONFIRMATION IS NEEDED FOR ANY PURPOSE, NOTIFY LAB WITHIN 5 DAYS.  LOWEST DETECTABLE LIMITS FOR URINE DRUG SCREEN Drug Class                     Cutoff (ng/mL) Amphetamine and metabolites    1000 Barbiturate and metabolites    200 Benzodiazepine                 841 Tricyclics and metabolites     300 Opiates and metabolites        300 Cocaine and metabolites        300 THC                            50 Performed at [facility], [place] [address], [place], [place] [address]   . HIV Screen 4th Generation wRfx 06/22/2020 Non Reactive  Non Reactive Final   Performed at [facility], [place] [address] [place], [place] [address]  . WBC 06/23/2020 7.3  4.0 - 10.5 K/uL Final  . RBC 06/23/2020 4.69  4.22 - 5.81 MIL/uL Final  . Hemoglobin 06/23/2020 14.4  13.0 - 17.0 g/dL Final  . HCT 06/23/2020 44.1  39.0 - 52.0 % Final  . MCV 06/23/2020 94.0  80.0 - 100.0 fL Final  . MCH 06/23/2020 30.7  26.0 - 34.0 pg Final  . MCHC 06/23/2020 32.7  30.0 - 36.0 g/dL Final  . RDW 06/23/2020 13.1  11.5 - 15.5 % Final  . Platelets 06/23/2020 158  150 - 400  K/uL Final  . nRBC 06/23/2020 0.0  0.0 - 0.2 % Final   Performed at [facility], [place] [address] [place], [place] [address]  . Sodium 06/23/2020 138  135 - 145 mmol/L Final  . Potassium 06/23/2020 4.5  3.5 - 5.1 mmol/L Final  . Chloride 06/23/2020 103  98 - 111 mmol/L Final  . CO2 06/23/2020 24  22 - 32 mmol/L Final  . Glucose, Bld 06/23/2020 137* 70 - 99 mg/dL Final   Glucose reference range applies only to samples taken after fasting for at least 8 hours.  . BUN 06/23/2020 10  8 - 23 mg/dL Final  . Creatinine, Ser 06/23/2020 0.99  0.61 - 1.24 mg/dL Final  . Calcium 06/23/2020 9.6  8.9 - 10.3 mg/dL Final  . GFR, Estimated 06/23/2020 >60  >60 mL/min Final   Comment: (NOTE) Calculated using the CKD-EPI Creatinine Equation (2021)   . Anion gap 06/23/2020 11  5 - 15 Final   Performed at [facility] [address] [place], [place] [address]  . CEA 06/23/2020 16.5* 0.0 - 4.7 ng/mL Final   Comment: (NOTE)  Nonsmokers          <3.9                             Smokers             <5.6 Roche Diagnostics Electrochemiluminescence Immunoassay (ECLIA) Values obtained with different assay methods or kits cannot be used interchangeably.  Results cannot be interpreted as absolute evidence of the presence or absence of malignant disease. Performed At: [facility] [place], [place] [id number] Rush Farmer MD [id number]   . CA 19-9 06/23/2020 281* 0 - 35 U/mL Final   Comment: (NOTE) **Verified by repeat analysis** Roche Diagnostics Electrochemiluminescence Immunoassay (ECLIA) Values obtained with different assay methods or kits cannot be used interchangeably.  Results cannot be interpreted as absolute evidence of the presence or absence of malignant disease. Performed At: [facility] [place], [place] [id number] Rush Farmer MD [id number]   . AFP, Serum, Tumor Marker 06/23/2020 2.9  0.0 - 8.3  ng/mL Final   Comment: (NOTE) Roche Diagnostics Electrochemiluminescence Immunoassay (ECLIA) Values obtained with different assay methods or kits cannot be used interchangeably.  Results cannot be interpreted as absolute evidence of the presence or absence of malignant disease. This test is not interpretable in pregnant females. Performed At: [facility] [place], [place] [id number] Rush Farmer MD [id number]   . Hgb A1c MFr Bld 06/23/2020 5.4  4.8 - 5.6 % Final   Comment: (NOTE) Pre diabetes:          5.7%-6.4%  Diabetes:              >6.4%  Glycemic control for   <7.0% adults with diabetes   . Mean Plasma Glucose 06/23/2020 108.28  mg/dL Final   Performed at [facility] [address], [place], [place] [address]  . Glucose-Capillary 06/23/2020 122* 70 - 99 mg/dL Final   Glucose reference range applies only to samples taken after fasting for at least 8 hours.  Marland Kitchen Specimen Description 06/23/2020 URINE, RANDOM   Final  . Special Requests 06/23/2020 None. Please collect for abx   Final  . Culture 06/23/2020 *  Final                   Value:<10,000 COLONIES/mL INSIGNIFICANT GROWTH Performed at [facility], [place] [address] [place], [place] [address]   . Report Status 06/23/2020 06/24/2020 FINAL   Final  . WBC 06/24/2020 8.4  4.0 - 10.5 K/uL Final  . RBC 06/24/2020 4.16* 4.22 - 5.81 MIL/uL Final  . Hemoglobin 06/24/2020 13.2  13.0 - 17.0 g/dL Final  . HCT 06/24/2020 39.8  39.0 - 52.0 % Final  . MCV 06/24/2020 95.7  80.0 - 100.0 fL Final  . MCH 06/24/2020 31.7  26.0 - 34.0 pg Final  . MCHC 06/24/2020 33.2  30.0 - 36.0 g/dL Final  . RDW 06/24/2020 13.5  11.5 - 15.5 % Final  . Platelets 06/24/2020 185  150 - 400 K/uL Final  . nRBC 06/24/2020 0.0  0.0 - 0.2 % Final   Performed at [facility] [address] [place],  [address]  . Sodium 06/24/2020 140  135 - 145 mmol/L Final  . Potassium 06/24/2020 5.2* 3.5 - 5.1 mmol/L Final   . Chloride 06/24/2020 103  98 - 111 mmol/L Final  . CO2 06/24/2020 24  22 - 32 mmol/L Final  .  Glucose, Bld 06/24/2020 70  70 - 99 mg/dL Final   Glucose reference range applies only to samples taken after fasting for at least 8 hours.  . BUN 06/24/2020 15  8 - 23 mg/dL Final  . Creatinine, Ser 06/24/2020 1.12  0.61 - 1.24 mg/dL Final  . Calcium 06/24/2020 10.0  8.9 - 10.3 mg/dL Final  . GFR, Estimated 06/24/2020 >60  >60 mL/min Final   Comment: (NOTE) Calculated using the CKD-EPI Creatinine Equation (2021)   . Anion gap 06/24/2020 13  5 - 15 Final   Performed at [facility] [address], [place], [place] [address]  . Glucose-Capillary 06/23/2020 89  70 - 99 mg/dL Final   Glucose reference range applies only to samples taken after fasting for at least 8 hours.  . Glucose-Capillary 06/23/2020 108* 70 - 99 mg/dL Final   Glucose reference range applies only to samples taken after fasting for at least 8 hours.  . Comment 1 06/23/2020 Document in Chart   Final  . Glucose-Capillary 06/23/2020 61* 70 - 99 mg/dL Final   Glucose reference range applies only to samples taken after fasting for at least 8 hours.  . Comment 1 06/23/2020 Document in Chart   Final  . Glucose-Capillary 06/24/2020 89  70 - 99 mg/dL Final   Glucose reference range applies only to samples taken after fasting for at least 8 hours.  . Comment 1 06/24/2020 Document in Chart   Final  . Glucose-Capillary 06/24/2020 93  70 - 99 mg/dL Final   Glucose reference range applies only to samples taken after fasting for at least 8 hours.  . Glucose-Capillary 06/24/2020 105* 70 - 99 mg/dL Final   Glucose reference range applies only to samples taken after fasting for at least 8 hours.    Blood Alcohol level:  Lab Results  Component Value Date   ETH <10 09/11/2020   ETH <10 78/29/5621    Metabolic Disorder Labs: Lab Results  Component Value Date   HGBA1C 5.6 09/11/2020   MPG 114.02 09/11/2020   MPG 108.28  06/23/2020   No results found for: PROLACTIN Lab Results  Component Value Date   CHOL 198 09/11/2020   TRIG 96 09/11/2020   HDL 76 09/11/2020   CHOLHDL 2.6 09/11/2020   VLDL 19 09/11/2020   LDLCALC 103 (H) 09/11/2020    Therapeutic Lab Levels: No results found for: LITHIUM No results found for: VALPROATE No components found for:  CBMZ  Physical Findings   AUDIT   Flowsheet Row Admission (Discharged) from 06/11/2013 in [facility]  Alcohol Use Disorder Identification Test Final Score (AUDIT) 28    CAGE-AID   Flowsheet Row ED to Hosp-Admission (Discharged) from 06/22/2020 in [facility] CV SURGICAL PROGRESSIVE CARE  CAGE-AID Score 2    PHQ2-9   [facility] ED from 09/11/2020 in [facility] Office Visit from 04/05/2018 in [facility] for Infectious Disease Office Visit from 12/09/2014 in [facility] for Infectious Disease  PHQ-2 Total Score 5 0 0  PHQ-9 Total Score 22 - -    Flowsheet Row ED from 09/11/2020 in [facility] Admission (Discharged) from 05/31/2018 in [facility] ED from 04/29/2018 in [facility] High Risk No Risk No Risk       Musculoskeletal  Strength & Muscle Tone: within normal limits Gait & Station:  normal Patient leans: N/A  Psychiatric Specialty Exam  Presentation  General Appearance: Appropriate for Environment; Casual  Eye Contact:Fair  Speech:Clear and Coherent; Normal Rate  Speech Volume:Normal  Handedness:No data recorded  Mood and Affect  Mood:Depressed; Anxious  Affect:Appropriate; Blunt   Thought Process  Thought Processes:Coherent; Goal Directed; Linear  Descriptions of Associations:Intact  Orientation:Full (Time, Place and Person)  Thought Content:WDL  Hallucinations:Hallucinations: None  Ideas of  Reference:None  Suicidal Thoughts:Suicidal Thoughts: No SI Active Intent and/or Plan: With Intent; With Plan (plan to overdose on heroin)  Homicidal Thoughts:Homicidal Thoughts: No   Sensorium  Memory:Immediate Good; Recent Good; Remote Fair  Judgment:Fair  Insight:Fair   Executive Functions  Concentration:Fair  Attention Span:Good  [place]  Language:Good   Psychomotor Activity  Psychomotor Activity:Psychomotor Activity: Normal   Assets  Assets:Communication Skills; Desire for Improvement; Resilience   Sleep  Sleep:Sleep: Fair   Physical Exam  Physical Exam Constitutional:      Appearance: Normal appearance.  HENT:     Head: Normocephalic and atraumatic.  Pulmonary:     Effort: Pulmonary effort is normal.  Neurological:     Mental Status: He is alert.    Review of Systems  Constitutional: Negative for fever.  Cardiovascular: Negative for chest pain.  Gastrointestinal: Negative for abdominal pain.  Neurological: Negative for headaches.  Psychiatric/Behavioral: Positive for substance abuse. Negative for suicidal ideas.   Blood pressure 127/68, pulse 63, temperature 98 F (36.7 C), resp. rate 18, SpO2 97 %. There is no height or weight on file to calculate BMI.  Treatment Plan Summary: -restarted home medications of gabapentin, metformin and glycolax. -continue  remeron 15 mg qhs for mood/anxiety; may help with appetite and sleep -he reports home medication of opioids; per PMP, has not received since 06/2020. Will not restart at this time.   -patient will need to leave [place] by 10-11 AM in order to make it to his flight as he will need to verify his identity using face sheet  -14 days of sample medications ordered from pharmacy; verify that patient has these upon discharge     Ival Bible, MD 09/12/2020 3:58 PM

## 2020-09-12 NOTE — Progress Notes (Signed)
Patient awake eating lunch and watching television.. No objective signs of pain or agitation at this time. Nursing staff will continue to monitor.

## 2020-09-12 NOTE — ED Notes (Signed)
Pt resting on pull out bed. No distress observed. Safety maintained and will continue to monitor.

## 2020-09-12 NOTE — Progress Notes (Signed)
Patient resting quietly, eyes closed,, respirations even and unlabored. Nursing staff will continue to monitor.

## 2020-09-12 NOTE — Progress Notes (Addendum)
CSW met with patient bedside and provided list of residential substance use treatment programs. Patient was encouraged to begin calling prioritized list. Patient was agreeable with this plan, CSW to follow up at a later time.   Signed:  Durenda Hurt, MSW, [place], LCASA 09/12/2020 11:36 AM   CSW met with patient bedside to follow up on substance use referrals. Patient was agreeable with [facility] and observed making appropriate calls to facilitate admission. CSW to assist by providing documentation as needed.   Signed:  Durenda Hurt, MSW, [place], LCASA 09/12/2020 1:54 PM   Patient accepted to [employer]. [facility] to provide transportation from [employer] airport to [place], [employer] staff to coordinate transportation to airport to [employer]. [facility] staff contact information [phone], Case Number [id number], FAX [phone].  Signed:  Durenda Hurt, MSW, [place], LCASA 09/12/2020 3:23 PM

## 2020-09-12 NOTE — ED Notes (Signed)
Pt asleep in bed. Respirations even and unlabored. Will continue to monitor for safety. ?

## 2020-09-12 NOTE — Progress Notes (Signed)
CSW provided [facility] RN's with patients plane ticket to [employer] afternoon.  Patients plane leaves [place] [facility] at [employer] but needs to be at airport no later than 12pm.  Patient will need a copy of his Facesheet at discharge for identification purposes at the airport.  Patient currently has an expired DL and may have to go through extra security procedures to board plane.  CSW emailed a copy of the ticket to Sat AM disposition CSW's.  [facility] Disposition [phone] (cell)

## 2020-09-12 NOTE — ED Notes (Signed)
Pt sleeping at present, no distress noted, monitoring for safety. 

## 2020-09-13 DIAGNOSIS — F1914 Other psychoactive substance abuse with psychoactive substance-induced mood disorder: Secondary | ICD-10-CM | POA: Diagnosis not present

## 2020-09-13 NOTE — Discharge Instructions (Addendum)
Take all medications as prescribed. Keep all follow-up appointments as scheduled.  Do not consume alcohol or use illegal drugs while on prescription medications. Report any adverse effects from your medications to your primary care provider promptly.  In the event of recurrent symptoms or worsening symptoms, call 911, a crisis hotline, or go to the nearest emergency department for evaluation.   

## 2020-09-13 NOTE — ED Notes (Signed)
Safe transport arrived to transfer Pt to the airport. Personal belongings returned. Safety maintained.
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
## 2020-09-13 NOTE — ED Notes (Signed)
Pt asleep in bed. Respirations even and unlabored. Will continue to monitor for safety. ?

## 2020-09-13 NOTE — ED Notes (Signed)
Safe transport called for services to the airport. D/C instructions provided and Pt stated understanding. Samples given. Personal belongings to be returned prior to being escorted [place] the [place]. Safety maintained and will continue to monitor.

## 2020-09-18 ENCOUNTER — Telehealth (HOSPITAL_COMMUNITY): Payer: Self-pay | Admitting: Family Medicine

## 2020-09-18 NOTE — Telephone Encounter (Signed)
Care Management - Follow Up [facility] Discharges   Writer attempted to make contact with patient today and was unsuccessful.  Writer was able to leave a HIPPA compliant voice message and will await callback.  Per chart review, patient accepted to [facility].
